# Patient Record
Sex: Male | Born: 1945
Health system: Southern US, Community
[De-identification: ages and names within clinical notes are randomized; demographics above are authoritative.]

## PROBLEM LIST (undated history)

## (undated) ENCOUNTER — Emergency Department (HOSPITAL_COMMUNITY): Payer: Medicare Other

## (undated) DIAGNOSIS — K5790 Diverticulosis of intestine, part unspecified, without perforation or abscess without bleeding: Secondary | ICD-10-CM

## (undated) DIAGNOSIS — N4340 Spermatocele of epididymis, unspecified: Secondary | ICD-10-CM

## (undated) DIAGNOSIS — E785 Hyperlipidemia, unspecified: Secondary | ICD-10-CM

## (undated) DIAGNOSIS — M1990 Unspecified osteoarthritis, unspecified site: Secondary | ICD-10-CM

## (undated) DIAGNOSIS — G4700 Insomnia, unspecified: Secondary | ICD-10-CM

## (undated) DIAGNOSIS — C44711 Basal cell carcinoma of skin of unspecified lower limb, including hip: Secondary | ICD-10-CM

## (undated) DIAGNOSIS — F32A Depression, unspecified: Secondary | ICD-10-CM

## (undated) DIAGNOSIS — H269 Unspecified cataract: Secondary | ICD-10-CM

## (undated) DIAGNOSIS — C61 Malignant neoplasm of prostate: Secondary | ICD-10-CM

## (undated) DIAGNOSIS — C439 Malignant melanoma of skin, unspecified: Secondary | ICD-10-CM

## (undated) DIAGNOSIS — F329 Major depressive disorder, single episode, unspecified: Secondary | ICD-10-CM

## (undated) DIAGNOSIS — N5231 Erectile dysfunction following radical prostatectomy: Secondary | ICD-10-CM

## (undated) DIAGNOSIS — T7840XA Allergy, unspecified, initial encounter: Secondary | ICD-10-CM

## (undated) DIAGNOSIS — I2510 Atherosclerotic heart disease of native coronary artery without angina pectoris: Secondary | ICD-10-CM

## (undated) DIAGNOSIS — I10 Essential (primary) hypertension: Secondary | ICD-10-CM

## (undated) DIAGNOSIS — F419 Anxiety disorder, unspecified: Principal | ICD-10-CM

## (undated) HISTORY — DX: Malignant neoplasm of prostate: C61

## (undated) HISTORY — PX: SPINE SURGERY: SHX786

## (undated) HISTORY — DX: Atherosclerotic heart disease of native coronary artery without angina pectoris: I25.10

## (undated) HISTORY — PX: EYE SURGERY: SHX253

## (undated) HISTORY — DX: Erectile dysfunction following radical prostatectomy: N52.31

## (undated) HISTORY — DX: Hyperlipidemia, unspecified: E78.5

## (undated) HISTORY — DX: Spermatocele of epididymis, unspecified: N43.40

## (undated) HISTORY — PX: HERNIA REPAIR: SHX51

## (undated) HISTORY — DX: Malignant melanoma of skin, unspecified: C43.9

## (undated) HISTORY — DX: Diverticulosis of intestine, part unspecified, without perforation or abscess without bleeding: K57.90

## (undated) HISTORY — DX: Anxiety disorder, unspecified: F41.9

## (undated) HISTORY — PX: KNEE ARTHROSCOPY: SHX127

## (undated) HISTORY — PX: JOINT REPLACEMENT: SHX530

## (undated) HISTORY — DX: Unspecified cataract: H26.9

## (undated) HISTORY — DX: Major depressive disorder, single episode, unspecified: F32.9

## (undated) HISTORY — DX: Insomnia, unspecified: G47.00

## (undated) HISTORY — DX: Allergy, unspecified, initial encounter: T78.40XA

## (undated) HISTORY — DX: Essential (primary) hypertension: I10

## (undated) HISTORY — DX: Basal cell carcinoma of skin of unspecified lower limb, including hip: C44.711

## (undated) HISTORY — PX: APPENDECTOMY: SHX54

## (undated) HISTORY — DX: Depression, unspecified: F32.A

---

## 1963-10-25 HISTORY — PX: KNEE ARTHROSCOPY: SHX127

## 1998-09-14 ENCOUNTER — Emergency Department (HOSPITAL_COMMUNITY): Admission: EM | Admit: 1998-09-14 | Discharge: 1998-09-14 | Payer: Self-pay | Admitting: Emergency Medicine

## 1998-09-14 ENCOUNTER — Encounter: Payer: Self-pay | Admitting: Emergency Medicine

## 2002-07-26 ENCOUNTER — Encounter: Admission: RE | Admit: 2002-07-26 | Discharge: 2002-07-26 | Payer: Self-pay | Admitting: Family Medicine

## 2002-07-26 ENCOUNTER — Encounter: Payer: Self-pay | Admitting: Family Medicine

## 2002-08-05 ENCOUNTER — Ambulatory Visit (HOSPITAL_COMMUNITY): Admission: RE | Admit: 2002-08-05 | Discharge: 2002-08-05 | Payer: Self-pay | Admitting: Gastroenterology

## 2002-08-05 ENCOUNTER — Encounter (INDEPENDENT_AMBULATORY_CARE_PROVIDER_SITE_OTHER): Payer: Self-pay | Admitting: Specialist

## 2002-08-05 ENCOUNTER — Encounter: Payer: Self-pay | Admitting: Internal Medicine

## 2006-02-15 ENCOUNTER — Encounter: Admission: RE | Admit: 2006-02-15 | Discharge: 2006-02-15 | Payer: Self-pay | Admitting: Family Medicine

## 2006-02-17 ENCOUNTER — Encounter: Admission: RE | Admit: 2006-02-17 | Discharge: 2006-02-17 | Payer: Self-pay | Admitting: Family Medicine

## 2006-08-22 ENCOUNTER — Ambulatory Visit: Payer: Self-pay | Admitting: Family Medicine

## 2006-09-05 ENCOUNTER — Ambulatory Visit: Payer: Self-pay | Admitting: Family Medicine

## 2006-09-05 LAB — CONVERTED CEMR LAB
ALT: 50 units/L — ABNORMAL HIGH (ref 0–40)
CO2: 28 meq/L (ref 19–32)
Chloride: 102 meq/L (ref 96–112)
Cholesterol: 218 mg/dL (ref 0–200)
Creatinine, Ser: 1 mg/dL (ref 0.4–1.5)
Glomerular Filtration Rate, Af Am: 97 mL/min/{1.73_m2}
Glucose, Bld: 92 mg/dL (ref 70–99)
Potassium: 4 meq/L (ref 3.5–5.1)
Sodium: 139 meq/L (ref 135–145)
Triglyceride fasting, serum: 276 mg/dL (ref 0–149)

## 2006-11-24 ENCOUNTER — Ambulatory Visit: Payer: Self-pay | Admitting: Family Medicine

## 2006-11-24 LAB — CONVERTED CEMR LAB
ALT: 49 units/L — ABNORMAL HIGH (ref 0–40)
BUN: 14 mg/dL (ref 6–23)
CO2: 28 meq/L (ref 19–32)
Chloride: 105 meq/L (ref 96–112)
Creatinine, Ser: 1 mg/dL (ref 0.4–1.5)
Direct LDL: 112.3 mg/dL
GFR calc Af Amer: 97 mL/min
Glucose, Bld: 90 mg/dL (ref 70–99)
Potassium: 4.3 meq/L (ref 3.5–5.1)
Sodium: 140 meq/L (ref 135–145)

## 2007-01-01 ENCOUNTER — Ambulatory Visit: Payer: Self-pay | Admitting: Family Medicine

## 2007-01-01 LAB — CONVERTED CEMR LAB
AST: 36 units/L (ref 0–37)
CO2: 32 meq/L (ref 19–32)
Calcium: 9.3 mg/dL (ref 8.4–10.5)
Direct LDL: 99.2 mg/dL
HDL: 46.6 mg/dL (ref >39.0)
Potassium: 4.2 meq/L (ref 3.5–5.1)
Sodium: 146 meq/L — ABNORMAL HIGH (ref 135–145)
Total CHOL/HDL Ratio: 3.9

## 2007-02-21 DIAGNOSIS — I10 Essential (primary) hypertension: Secondary | ICD-10-CM

## 2007-02-21 DIAGNOSIS — E785 Hyperlipidemia, unspecified: Secondary | ICD-10-CM | POA: Insufficient documentation

## 2007-02-21 DIAGNOSIS — K5730 Diverticulosis of large intestine without perforation or abscess without bleeding: Secondary | ICD-10-CM | POA: Insufficient documentation

## 2007-02-21 DIAGNOSIS — G4700 Insomnia, unspecified: Secondary | ICD-10-CM

## 2007-02-23 ENCOUNTER — Ambulatory Visit: Payer: Self-pay | Admitting: Family Medicine

## 2007-02-23 LAB — CONVERTED CEMR LAB
Calcium: 9.3 mg/dL (ref 8.4–10.5)
Chloride: 108 meq/L (ref 96–112)
Creatinine, Ser: 0.9 mg/dL (ref 0.4–1.5)
GFR calc Af Amer: 110 mL/min
HDL: 46.6 mg/dL (ref >39.0)
Potassium: 4.3 meq/L (ref 3.5–5.1)
Sodium: 143 meq/L (ref 135–145)
Triglycerides: 228 mg/dL (ref 0–149)

## 2007-03-23 ENCOUNTER — Ambulatory Visit: Payer: Self-pay | Admitting: Family Medicine

## 2007-03-23 LAB — CONVERTED CEMR LAB
BUN: 7 mg/dL (ref 6–23)
CO2: 31 meq/L (ref 19–32)
Creatinine, Ser: 0.8 mg/dL (ref 0.4–1.5)
GFR calc Af Amer: 126 mL/min
GFR calc non Af Amer: 104 mL/min
Glucose, Bld: 91 mg/dL (ref 70–99)

## 2007-04-26 ENCOUNTER — Ambulatory Visit: Payer: Self-pay | Admitting: Family Medicine

## 2007-04-29 LAB — CONVERTED CEMR LAB
BUN: 11 mg/dL (ref 6–23)
GFR calc Af Amer: 126 mL/min
GFR calc non Af Amer: 104 mL/min
Glucose, Bld: 99 mg/dL (ref 70–99)

## 2007-04-30 ENCOUNTER — Telehealth (INDEPENDENT_AMBULATORY_CARE_PROVIDER_SITE_OTHER): Payer: Self-pay | Admitting: *Deleted

## 2007-06-26 ENCOUNTER — Ambulatory Visit: Payer: Self-pay | Admitting: Family Medicine

## 2007-06-27 ENCOUNTER — Telehealth (INDEPENDENT_AMBULATORY_CARE_PROVIDER_SITE_OTHER): Payer: Self-pay | Admitting: *Deleted

## 2007-06-27 LAB — CONVERTED CEMR LAB
CO2: 28 meq/L (ref 19–32)
Cholesterol: 195 mg/dL (ref 0–200)
GFR calc Af Amer: 126 mL/min
Glucose, Bld: 100 mg/dL — ABNORMAL HIGH (ref 70–99)
HDL: 48.1 mg/dL (ref >39.0)
PSA: 4.64 ng/mL — ABNORMAL HIGH (ref 0.10–4.00)
Potassium: 3.5 meq/L (ref 3.5–5.1)
Total CHOL/HDL Ratio: 4.1
VLDL: 41 mg/dL — ABNORMAL HIGH (ref 0–40)

## 2007-07-17 ENCOUNTER — Encounter (INDEPENDENT_AMBULATORY_CARE_PROVIDER_SITE_OTHER): Payer: Self-pay | Admitting: Family Medicine

## 2007-07-25 DIAGNOSIS — C61 Malignant neoplasm of prostate: Secondary | ICD-10-CM

## 2007-07-25 HISTORY — DX: Malignant neoplasm of prostate: C61

## 2007-07-25 HISTORY — PX: PROSTATECTOMY: SHX69

## 2007-07-27 ENCOUNTER — Encounter (INDEPENDENT_AMBULATORY_CARE_PROVIDER_SITE_OTHER): Payer: Self-pay | Admitting: Family Medicine

## 2007-07-31 ENCOUNTER — Encounter (INDEPENDENT_AMBULATORY_CARE_PROVIDER_SITE_OTHER): Payer: Self-pay | Admitting: Family Medicine

## 2007-08-05 DIAGNOSIS — Z8546 Personal history of malignant neoplasm of prostate: Secondary | ICD-10-CM

## 2007-08-16 ENCOUNTER — Encounter (INDEPENDENT_AMBULATORY_CARE_PROVIDER_SITE_OTHER): Payer: Self-pay | Admitting: Family Medicine

## 2007-09-18 ENCOUNTER — Telehealth (INDEPENDENT_AMBULATORY_CARE_PROVIDER_SITE_OTHER): Payer: Self-pay | Admitting: *Deleted

## 2007-10-03 ENCOUNTER — Encounter (INDEPENDENT_AMBULATORY_CARE_PROVIDER_SITE_OTHER): Payer: Self-pay | Admitting: Urology

## 2007-10-03 ENCOUNTER — Inpatient Hospital Stay (HOSPITAL_COMMUNITY): Admission: RE | Admit: 2007-10-03 | Discharge: 2007-10-04 | Payer: Self-pay | Admitting: Urology

## 2007-10-12 ENCOUNTER — Encounter (INDEPENDENT_AMBULATORY_CARE_PROVIDER_SITE_OTHER): Payer: Self-pay | Admitting: Family Medicine

## 2007-11-14 ENCOUNTER — Telehealth (INDEPENDENT_AMBULATORY_CARE_PROVIDER_SITE_OTHER): Payer: Self-pay | Admitting: *Deleted

## 2007-11-16 ENCOUNTER — Encounter (INDEPENDENT_AMBULATORY_CARE_PROVIDER_SITE_OTHER): Payer: Self-pay | Admitting: Family Medicine

## 2007-11-19 ENCOUNTER — Telehealth (INDEPENDENT_AMBULATORY_CARE_PROVIDER_SITE_OTHER): Payer: Self-pay | Admitting: *Deleted

## 2007-11-20 ENCOUNTER — Ambulatory Visit: Payer: Self-pay | Admitting: Family Medicine

## 2007-11-20 DIAGNOSIS — C4330 Malignant melanoma of unspecified part of face: Secondary | ICD-10-CM

## 2007-11-23 ENCOUNTER — Encounter (INDEPENDENT_AMBULATORY_CARE_PROVIDER_SITE_OTHER): Payer: Self-pay | Admitting: *Deleted

## 2007-11-23 LAB — CONVERTED CEMR LAB
ALT: 44 units/L (ref 0–53)
BUN: 9 mg/dL (ref 6–23)
CO2: 30 meq/L (ref 19–32)
GFR calc non Af Amer: 103 mL/min
Glucose, Bld: 100 mg/dL — ABNORMAL HIGH (ref 70–99)
HDL: 48.2 mg/dL (ref >39.0)
Sodium: 139 meq/L (ref 135–145)
Total CHOL/HDL Ratio: 4.6

## 2008-01-07 ENCOUNTER — Encounter: Payer: Self-pay | Admitting: Internal Medicine

## 2008-05-23 ENCOUNTER — Telehealth (INDEPENDENT_AMBULATORY_CARE_PROVIDER_SITE_OTHER): Payer: Self-pay | Admitting: *Deleted

## 2008-06-18 ENCOUNTER — Ambulatory Visit: Payer: Self-pay | Admitting: Internal Medicine

## 2008-09-19 ENCOUNTER — Ambulatory Visit: Payer: Self-pay | Admitting: Internal Medicine

## 2008-09-26 ENCOUNTER — Telehealth (INDEPENDENT_AMBULATORY_CARE_PROVIDER_SITE_OTHER): Payer: Self-pay | Admitting: *Deleted

## 2008-09-26 LAB — CONVERTED CEMR LAB
ALT: 55 units/L — ABNORMAL HIGH (ref 0–53)
AST: 38 units/L — ABNORMAL HIGH (ref 0–37)
BUN: 10 mg/dL (ref 6–23)
Basophils Absolute: 0 10*3/uL (ref 0.0–0.1)
Basophils Relative: 0.7 % (ref 0.0–3.0)
CO2: 30 meq/L (ref 19–32)
Calcium: 9.1 mg/dL (ref 8.4–10.5)
Chloride: 103 meq/L (ref 96–112)
Cholesterol: 204 mg/dL (ref 0–200)
Creatinine, Ser: 0.9 mg/dL (ref 0.4–1.5)
Direct LDL: 97.8 mg/dL
Eosinophils Absolute: 0.3 10*3/uL (ref 0.0–0.7)
Eosinophils Relative: 5.3 % — ABNORMAL HIGH (ref 0.0–5.0)
GFR calc Af Amer: 109 mL/min
GFR calc non Af Amer: 90 mL/min
Glucose, Bld: 91 mg/dL (ref 70–99)
HCT: 44.5 % (ref 39.0–52.0)
HDL: 57.4 mg/dL (ref >39.0)
Hemoglobin: 15.2 g/dL (ref 13.0–17.0)
Lymphocytes Relative: 26 % (ref 12.0–46.0)
MCHC: 34.2 g/dL (ref 30.0–36.0)
MCV: 93.6 fL (ref 78.0–100.0)
Monocytes Absolute: 0.6 10*3/uL (ref 0.1–1.0)
Monocytes Relative: 9.7 % (ref 3.0–12.0)
Neutro Abs: 3.4 10*3/uL (ref 1.4–7.7)
Neutrophils Relative %: 58.3 % (ref 43.0–77.0)
PSA: 0.01 ng/mL — ABNORMAL LOW (ref 0.10–4.00)
Platelets: 256 10*3/uL (ref 150–400)
Potassium: 3.9 meq/L (ref 3.5–5.1)
RBC: 4.76 M/uL (ref 4.22–5.81)
RDW: 12.6 % (ref 11.5–14.6)
Sodium: 141 meq/L (ref 135–145)
TSH: 1.15 microintl units/mL (ref 0.35–5.50)
Total CHOL/HDL Ratio: 3.6
Triglycerides: 182 mg/dL — ABNORMAL HIGH (ref 0–149)
VLDL: 36 mg/dL (ref 0–40)
WBC: 5.8 10*3/uL (ref 4.5–10.5)

## 2008-10-13 ENCOUNTER — Encounter: Payer: Self-pay | Admitting: Internal Medicine

## 2008-10-24 DIAGNOSIS — C439 Malignant melanoma of skin, unspecified: Secondary | ICD-10-CM

## 2008-10-24 HISTORY — PX: HERNIA REPAIR: SHX51

## 2008-10-24 HISTORY — DX: Malignant melanoma of skin, unspecified: C43.9

## 2009-03-19 ENCOUNTER — Ambulatory Visit: Payer: Self-pay | Admitting: Internal Medicine

## 2009-03-19 DIAGNOSIS — R7401 Elevation of levels of liver transaminase levels: Secondary | ICD-10-CM | POA: Insufficient documentation

## 2009-03-19 DIAGNOSIS — R740 Nonspecific elevation of levels of transaminase and lactic acid dehydrogenase [LDH]: Secondary | ICD-10-CM

## 2009-03-20 ENCOUNTER — Telehealth (INDEPENDENT_AMBULATORY_CARE_PROVIDER_SITE_OTHER): Payer: Self-pay | Admitting: *Deleted

## 2009-03-20 LAB — CONVERTED CEMR LAB
Bilirubin, Direct: 0.1 mg/dL (ref 0.0–0.3)
Total Bilirubin: 1.7 mg/dL — ABNORMAL HIGH (ref 0.3–1.2)

## 2009-04-21 ENCOUNTER — Encounter: Payer: Self-pay | Admitting: Internal Medicine

## 2009-06-16 ENCOUNTER — Encounter: Payer: Self-pay | Admitting: Internal Medicine

## 2009-09-01 ENCOUNTER — Telehealth: Payer: Self-pay | Admitting: Family Medicine

## 2009-09-16 ENCOUNTER — Ambulatory Visit: Payer: Self-pay | Admitting: Internal Medicine

## 2009-09-21 ENCOUNTER — Encounter (INDEPENDENT_AMBULATORY_CARE_PROVIDER_SITE_OTHER): Payer: Self-pay | Admitting: *Deleted

## 2009-09-21 LAB — CONVERTED CEMR LAB
BUN: 12 mg/dL (ref 6–23)
Basophils Relative: 0.6 % (ref 0.0–3.0)
CO2: 28 meq/L (ref 19–32)
Calcium: 9.2 mg/dL (ref 8.4–10.5)
Cholesterol: 211 mg/dL — ABNORMAL HIGH (ref 0–200)
Direct LDL: 120.3 mg/dL
GFR calc non Af Amer: 102.84 mL/min (ref >60)
Glucose, Bld: 88 mg/dL (ref 70–99)
HCT: 44.2 % (ref 39.0–52.0)
HDL: 50.8 mg/dL (ref >39.00)
Hemoglobin: 15.2 g/dL (ref 13.0–17.0)
Lymphocytes Relative: 23.8 % (ref 12.0–46.0)
Monocytes Absolute: 0.5 10*3/uL (ref 0.1–1.0)
Neutro Abs: 3 10*3/uL (ref 1.4–7.7)
Neutrophils Relative %: 62.1 % (ref 43.0–77.0)
PSA: 0.01 ng/mL — ABNORMAL LOW (ref 0.10–4.00)
TSH: 0.87 microintl units/mL (ref 0.35–5.50)
Total CHOL/HDL Ratio: 4

## 2009-09-22 ENCOUNTER — Telehealth: Payer: Self-pay | Admitting: Internal Medicine

## 2009-10-13 ENCOUNTER — Encounter: Payer: Self-pay | Admitting: Internal Medicine

## 2009-11-07 ENCOUNTER — Emergency Department (HOSPITAL_COMMUNITY): Admission: EM | Admit: 2009-11-07 | Discharge: 2009-11-07 | Payer: Self-pay | Admitting: Emergency Medicine

## 2009-11-20 ENCOUNTER — Ambulatory Visit: Payer: Self-pay | Admitting: Internal Medicine

## 2010-03-17 ENCOUNTER — Ambulatory Visit: Payer: Self-pay | Admitting: Internal Medicine

## 2010-03-17 DIAGNOSIS — M1990 Unspecified osteoarthritis, unspecified site: Secondary | ICD-10-CM

## 2010-03-18 LAB — CONVERTED CEMR LAB
ALT: 29 units/L (ref 0–53)
Triglycerides: 252 mg/dL — ABNORMAL HIGH (ref 0.0–149.0)
VLDL: 50.4 mg/dL — ABNORMAL HIGH (ref 0.0–40.0)

## 2010-04-08 ENCOUNTER — Telehealth (INDEPENDENT_AMBULATORY_CARE_PROVIDER_SITE_OTHER): Payer: Self-pay | Admitting: *Deleted

## 2010-04-16 ENCOUNTER — Ambulatory Visit: Payer: Self-pay | Admitting: Internal Medicine

## 2010-06-14 ENCOUNTER — Inpatient Hospital Stay (HOSPITAL_COMMUNITY): Admission: AD | Admit: 2010-06-14 | Discharge: 2010-06-15 | Payer: Self-pay | Admitting: Internal Medicine

## 2010-06-14 ENCOUNTER — Ambulatory Visit: Payer: Self-pay | Admitting: Internal Medicine

## 2010-07-01 ENCOUNTER — Ambulatory Visit: Payer: Self-pay | Admitting: Internal Medicine

## 2010-07-01 DIAGNOSIS — R739 Hyperglycemia, unspecified: Secondary | ICD-10-CM

## 2010-08-23 ENCOUNTER — Telehealth (INDEPENDENT_AMBULATORY_CARE_PROVIDER_SITE_OTHER): Payer: Self-pay | Admitting: *Deleted

## 2010-08-26 ENCOUNTER — Encounter (INDEPENDENT_AMBULATORY_CARE_PROVIDER_SITE_OTHER): Payer: Self-pay | Admitting: *Deleted

## 2010-10-13 ENCOUNTER — Telehealth (INDEPENDENT_AMBULATORY_CARE_PROVIDER_SITE_OTHER): Payer: Self-pay | Admitting: *Deleted

## 2010-10-15 ENCOUNTER — Encounter: Payer: Self-pay | Admitting: Internal Medicine

## 2010-11-04 ENCOUNTER — Other Ambulatory Visit: Payer: Self-pay | Admitting: Internal Medicine

## 2010-11-04 ENCOUNTER — Ambulatory Visit
Admission: RE | Admit: 2010-11-04 | Discharge: 2010-11-04 | Payer: Self-pay | Source: Home / Self Care | Attending: Internal Medicine | Admitting: Internal Medicine

## 2010-11-04 LAB — BASIC METABOLIC PANEL
BUN: 13 mg/dL (ref 6–23)
CO2: 32 mEq/L (ref 19–32)
Calcium: 9.1 mg/dL (ref 8.4–10.5)
Chloride: 102 mEq/L (ref 96–112)
Creatinine, Ser: 0.8 mg/dL (ref 0.4–1.5)
GFR: 103.99 mL/min (ref >60.00)
Glucose, Bld: 90 mg/dL (ref 70–99)
Potassium: 3.9 mEq/L (ref 3.5–5.1)
Sodium: 139 mEq/L (ref 135–145)

## 2010-11-04 LAB — AST: AST: 37 U/L (ref 0–37)

## 2010-11-04 LAB — ALT: ALT: 51 U/L (ref 0–53)

## 2010-11-23 NOTE — Progress Notes (Signed)
----   Converted from flag ---- ---- 04/07/2010 5:56 PM, Jose E. Paz MD wrote: please call the patient, he is interested in a shingles shot. Schedule the injection  ---- 03/17/2010 1:16 PM, Jose E. Paz MD wrote: we need to let him know to call by mid  June for his shingles shot ------------------------------  LEFT PT DETAIL MESSAGE VACCINE IN. PLEASE CALL TO SCHEDULE A APPT

## 2010-11-23 NOTE — Assessment & Plan Note (Signed)
Summary: 6 mo. f/u - jr   Vital Signs:  Patient profile:   65 year old male Height:      70.5 inches Weight:      196 pounds BMI:     27.83 Pulse rate:   72 / minute BP sitting:   130 / 70  Vitals Entered By: Shary Decamp (Mar 17, 2010 7:59 AM) CC: rov, fasting   History of Present Illness: routine office visit, doing well Has noted a hard mass in the fourth left knuckle about 3 weeks ago. No pain  Current Medications (verified): 1)  Lovastatin 20 Mg Tabs (Lovastatin) .... Take 1 Tablet By Mouth Once A Day 2)  Hydrochlorothiazide 25 Mg Tabs (Hydrochlorothiazide) .... Take 1 Tablet By Mouth Once A Day 3)  Enalapril Maleate 5 Mg Tabs (Enalapril Maleate) .... Take 1 Tablet By Mouth Once A Day 4)  Doxepin Hcl 50 Mg Caps (Doxepin Hcl) .Marland Kitchen.. 1 By Mouth Once Daily 5)  Fish Oil 6)  Fiber Laxative 7)  Vitamin E 8)  Mvi 9)  Niaspan 1000 Mg Cr-Tabs (Niacin (Antihyperlipidemic)) .Marland Kitchen.. 1 By Mouth Once Daily  Allergies (verified): 1)  ! Pcn 2)  ! Aspirin  Past History:  Past Medical History: MELANOMA  Dx 2010 (face) Diverticulosis, colon Hyperlipidemia Hypertension h/o INSOMNIA    PROSTATE CA--Dx 10-08...s/p Robotic Surgery TEAR, MENISCUS NEC, CURRENT    Social History: Reviewed history from 09/16/2009 and no changes required. 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   Lungs:  normal respiratory effort, no intercostal retractions, no accessory muscle use, and normal breath sounds.   Heart:  normal rate, regular rhythm, and no murmur.   Extremities:  right hand normal Left hand,  fourth knuckle has a 1/2 centimeter hard mass , likely a bony abnormality. No red or tender   Impression & Recommendations:  Problem # 1:  DEGENERATIVE JOINT DISEASE (ICD-715.90) fourth knuckle prominence likely posterior OA, check a  x-ray Orders: T-Hand Left 3 Views (73130TC)  Problem # 2:  HYPERTENSION (ICD-401.9) well-controlled His updated medication list for this problem includes:    Hydrochlorothiazide 25 Mg Tabs (Hydrochlorothiazide) .Marland Kitchen... Take 1 tablet by mouth once a day    Enalapril Maleate 5 Mg Tabs (Enalapril maleate) .Marland Kitchen... Take 1 tablet by mouth once a day  BP today: 130/70 Prior BP: 120/80 (11/20/2009)  Labs Reviewed: K+: 3.6 (09/16/2009) Creat: : 0.8 (09/16/2009)   Chol: 211 (09/16/2009)   HDL: 50.80 (09/16/2009)   LDL: DEL (09/19/2008)   TG: 328.0 (09/16/2009)  Problem # 3:  HYPERLIPIDEMIA (ICD-272.4) doing great with diet, tolerating higher dose of Niaspan, checking labs His updated medication list for this problem includes:    Lovastatin 20 Mg Tabs (Lovastatin) .Marland Kitchen... Take 1 tablet by mouth once a day    Niaspan 1000 Mg Cr-tabs (Niacin (antihyperlipidemic)) .Marland Kitchen... 1 by mouth once daily  Orders: Venipuncture (45409) TLB-ALT (SGPT) (84460-ALT) TLB-AST (SGOT) (84450-SGOT) TLB-Lipid Panel (80061-LIPID)  Labs Reviewed: SGOT: 29 (09/16/2009)   SGPT: 40 (09/16/2009)   HDL:50.80 (09/16/2009), 57.4 (09/19/2008)  LDL:DEL (09/19/2008), DEL (11/20/2007)  Chol:211 (09/16/2009), 204 (09/19/2008)  Trig:328.0 (09/16/2009), 182 (  09/19/2008)  Problem # 4:  HEALTH SCREENING (ICD-V70.0) next colonoscopy 12-11 Interested in a shingles  shot, will let him know when the shot is available  Complete Medication List: 1)  Lovastatin 20 Mg Tabs (Lovastatin) .... Take 1 tablet by mouth once a day 2)  Hydrochlorothiazide 25 Mg Tabs (Hydrochlorothiazide) .... Take 1 tablet by mouth once a day 3)  Enalapril Maleate 5 Mg Tabs (Enalapril maleate) .... Take 1 tablet by mouth once a  day 4)  Doxepin Hcl 50 Mg Caps (Doxepin hcl) .Marland Kitchen.. 1 by mouth once daily 5)  Fish Oil  6)  Fiber Laxative  7)  Vitamin E  8)  Mvi  9)  Niaspan 1000 Mg Cr-tabs (Niacin (antihyperlipidemic)) .Marland Kitchen.. 1 by mouth once daily  Patient Instructions: 1)  Please schedule a follow-up appointment in 6 months .

## 2010-11-23 NOTE — Assessment & Plan Note (Signed)
Summary: REMOVE STAPLES/KDC   Vital Signs:  Patient profile:   65 year old male Height:      70.5 inches Pulse rate:   76 / minute BP sitting:   120 / 80  Vitals Entered By: Shary Decamp (November 20, 2009 11:01 AM) CC: remove staples Comments  - went to ED 1/15, laceration to scalp  - removed 10 staples with no problems, difficulty  - advised to call with any questions or concerns Shary Decamp  November 20, 2009 11:02 AM    Allergies: 1)  ! Pcn 2)  ! Aspirin   Complete Medication List: 1)  Lovastatin 20 Mg Tabs (Lovastatin) .... Take 1 tablet by mouth once a day 2)  Hydrochlorothiazide 25 Mg Tabs (Hydrochlorothiazide) .... Take 1 tablet by mouth once a day 3)  Enalapril Maleate 5 Mg Tabs (Enalapril maleate) .... Take 1 tablet by mouth once a day 4)  Doxepin Hcl 50 Mg Caps (Doxepin hcl) .Marland Kitchen.. 1 by mouth once daily 5)  Fish Oil  6)  Fiber Laxative  7)  Vitamin E  8)  Mvi  9)  Niaspan 1000 Mg Cr-tabs (Niacin (antihyperlipidemic)) .Marland Kitchen.. 1 by mouth once daily  Other Orders: Suture Removal by Non-Operative MD [phone])

## 2010-11-23 NOTE — Assessment & Plan Note (Signed)
Summary: 2WK CARDIAC CATH F/U PER PT /RH..........Larry Elliott   Vital Signs:  Patient profile:   65 year old male Weight:      192.13 pounds Pulse rate:   73 / minute Pulse rhythm:   regular BP sitting:   126 / 82  (left arm) Cuff size:   large  Vitals Entered By: Army Fossa CMA (July 01, 2010 10:55 AM) Larry Elliott    [facility]: Hospital f/u, not fasting Comments Discuss niaspan Declines flu shot   History of Present Illness: status post admission for chest pain and dyspnea D-dimer negative CBC was normal, TSH 1.2, hemoglobin A1c 5.6, potassium 3.7, creatinine 0.8 Chest x-ray negative Cardiac catheterization showed minimal coronary artery plaque.  Normal left ventricular function.  Normal right heart pressures and saturations.    Current Medications (verified): 1)  Lovastatin 20 Mg Tabs (Lovastatin) .... Take 1 Tablet By Mouth Once A Day 2)  Hydrochlorothiazide 25 Mg Tabs (Hydrochlorothiazide) .... Take 1 Tablet By Mouth Once A Day 3)  Enalapril Maleate 5 Mg Tabs (Enalapril Maleate) .... Take 1 Tablet By Mouth Once A Day 4)  Doxepin Hcl 50 Mg Caps (Doxepin Hcl) .Larry Elliott.. 1 By Mouth Once Daily 5)  Fish Oil 6)  Fiber Laxative 7)  Vitamin E 8)  Mvi 9)  Niaspan 1000 Mg Cr-Tabs (Niacin (Antihyperlipidemic)) .Larry Elliott.. 1 By Mouth Once Daily 10)  Terbinafine Hcl 250 Mg Tabs (Terbinafine Hcl) .Larry Elliott.. 1 By Mouth Daily  Allergies: 1)  ! Pcn 2)  ! Aspirin  Past History:  Past Medical History: MELANOMA  Dx 2010 (face) Diverticulosis, colon Hyperlipidemia Hypertension h/o INSOMNIA    PROSTATE CA--Dx 10-08...s/p Robotic Surgery TEAR, MENISCUS NEC, CURRENT   8-2011CP, Cath non-obstrutive CAD  Social History: Reviewed history from 09/16/2009 and no changes required. 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 No abdominal pain.  He is essentially back to his normal activities occ. his legs bother him at night, he needs to "shake them" to feel better.  Physical Exam  General:  alert and well-developed.   Lungs:  normal respiratory effort, no intercostal retractions, no accessory muscle use, and normal breath sounds.   Heart:  normal rate, regular rhythm, and no murmur.   Abdomen:  soft, non-tender, no distention, no masses, no guarding, and no rigidity.   groins free of mass Pulses:  normal  femoral pulses bilaterally. Extremities:  Note no extremity edema Psych:  not anxious appearing and not depressed appearing.     Impression & Recommendations:  Problem # 1:  CHEST PAIN (ICD-786.50) resolved  cardiac catheterization essentially negative  Problem # 2:  HYPERTENSION (ICD-401.9) no change His updated medication list for this problem includes:    Hydrochlorothiazide 25 Mg Tabs (Hydrochlorothiazide) .Larry Elliott... Take 1 tablet by mouth once a day    Enalapril Maleate 5 Mg Tabs (Enalapril maleate) .Larry Elliott... Take 1 tablet by mouth once a day  BP today: 126/82 Prior BP: 126/82 (06/14/2010)  Labs Reviewed: K+: 3.6 (09/16/2009) Creat: : 0.8 (09/16/2009)   Chol: 218 (03/17/2010)   HDL: 65.90 (03/17/2010)   LDL: DEL (09/19/2008)   TG: 252.0 (03/17/2010)  Problem # 3:  HYPERLIPIDEMIA (ICD-272.4) likes to switch to a different Niaspan,likes to try niacin  500 mg twice a day (cost!). His new pharmacist will call for a refill when needed His updated medication list for this problem includes:    Lovastatin 20 Mg Tabs (Lovastatin) .Larry Elliott... Take 1 tablet by mouth once a day    Niacin 500 Mg Tabs (Niacin) .Larry Elliott... 2 a day  Labs Reviewed: SGOT: 26 (03/17/2010)   SGPT: 29 (03/17/2010)   HDL:65.90 (03/17/2010), 50.80 (09/16/2009)   LDL:DEL (09/19/2008), DEL (11/20/2007)  Chol:218 (03/17/2010), 211 (09/16/2009)  Trig:252.0 (03/17/2010), 328.0 (09/16/2009)  Problem # 4:  HYPERGLYCEMIA, BORDERLINE (ICD-790.29) hemoglobin A1c at the hospital was 5.6 This was discussed with patient Diet and exercise for now  Problem # 5:  ? of RESTLESS LEG SYNDROME (ICD-333.94) see  review of systems Mild RLS ? Plan: Benadryl as needed, if symptoms progress will consider other medications  Problem # 6:  will get a flu shot at work  Complete Medication List: 1)  Lovastatin 20 Mg Tabs (Lovastatin) .... Take 1 tablet by mouth once a day 2)  Hydrochlorothiazide 25 Mg Tabs (Hydrochlorothiazide) .... Take 1 tablet by mouth once a day 3)  Enalapril Maleate 5 Mg Tabs (Enalapril maleate) .... Take 1 tablet by mouth once a day 4)  Doxepin Hcl 50 Mg Caps (Doxepin hcl) .Larry Elliott.. 1 by mouth once daily 5)  Fish Oil  6)  Fiber Laxative  7)  Vitamin E  8)  Mvi  9)  Niacin 500 Mg Tabs (Niacin) .... 2 a day 10)  Terbinafine Hcl 250 Mg Tabs (Terbinafine hcl) .Larry Elliott.. 1 by mouth daily  Patient Instructions: 1)  Please schedule a follow-up appointment in 4 months .

## 2010-11-23 NOTE — Assessment & Plan Note (Signed)
Summary: SHINGLE VACCINE//KN  Nurse Visit   Allergies: 1)  ! Pcn 2)  ! Aspirin  Immunizations Administered:  Zostavax # 1:    Vaccine Type: Zostavax    Site: right arm    Mfr: Merck    Dose: 0.5 ml    Route: [place]    Given by: Mervin Kung CMA    Exp. Date: 05/19/2011    Lot #: [id number]  Orders Added: 1)  Zoster (Shingles) Vaccine Live [90736] 2)  Admin 1st Vaccine [phone]

## 2010-11-23 NOTE — Miscellaneous (Signed)
Summary: flu shot @ [facility]   Immunization History:  Influenza Immunization History:    Influenza:  got @ [facility] (08/19/2010)

## 2010-11-23 NOTE — Assessment & Plan Note (Signed)
Summary: chest pressure//lch   Vital Signs:  Patient profile:   65 year old male Weight:      194 pounds Pulse rate:   104 / minute Pulse rhythm:   regular BP sitting:   126 / 82  (left arm) Cuff size:   large  Vitals Entered By: Army Fossa CMA (June 14, 2010 2:02 PM) CC: Tightness in chest and SOB x 2 weeks.    History of Present Illness: chief complaint chest pain 2-3 week history of chest tightness, located at day anterior lower chest, on and off, worse with exertion, never goes away completely. The "tightness" does not change with eating, sometimes get worse by pushing in that area Along with the chest pain, he has also noticed dyspnea on exertion with activities  that otherwise in the recent past he would be able to do without problems. In fact, he got short of breath walking from his car to the front desk today.  ROS Denies fevers Denies cough or wheezing No abdominal pain or GERD symptoms. Specifically no right upper quadrant abdominal pain Note no extremity edema, no recent airplane flights or pain in the legs.  Current Medications (verified): 1)  Lovastatin 20 Mg Tabs (Lovastatin) .... Take 1 Tablet By Mouth Once A Day 2)  Hydrochlorothiazide 25 Mg Tabs (Hydrochlorothiazide) .... Take 1 Tablet By Mouth Once A Day 3)  Enalapril Maleate 5 Mg Tabs (Enalapril Maleate) .... Take 1 Tablet By Mouth Once A Day 4)  Doxepin Hcl 50 Mg Caps (Doxepin Hcl) .Marland Kitchen.. 1 By Mouth Once Daily 5)  Fish Oil 6)  Fiber Laxative 7)  Vitamin E 8)  Mvi 9)  Niaspan 1000 Mg Cr-Tabs (Niacin (Antihyperlipidemic)) .Marland Kitchen.. 1 By Mouth Once Daily  Allergies (verified): 1)  ! Pcn 2)  ! Aspirin  Past History:  Past Medical History: Reviewed history from 03/17/2010 and no changes required. MELANOMA  Dx 2010 (face) Diverticulosis, colon Hyperlipidemia Hypertension h/o INSOMNIA    PROSTATE CA--Dx 10-08...s/p Robotic Surgery TEAR, MENISCUS NEC, CURRENT    Past Surgical History: Reviewed  history from 02/21/2007 and no changes required. Appendectomy Hernia  Social History: Reviewed history from 09/16/2009 and no changes required. 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   Chest Wall:  slightly tender at the distal area of the sternum Lungs:  normal respiratory effort, no intercostal retractions, no accessory muscle use, and normal breath sounds.   Heart:  normal rate, regular rhythm, and no murmur.   Abdomen:  soft, non-tender, no distention, no masses, no guarding, and no rigidity.   Extremities:  no edema Psych:  Oriented X3, memory intact for recent and remote, normally interactive, good eye contact, not anxious appearing, and not depressed appearing.     Impression & Recommendations:  Problem # 1:  CHEST PAIN (ICD-29.64)  65 year old gentleman with history of hypertension,  high cholesterol who presents with 2-3 week history of chest pain (with typical and atypical features) as well as  dyspnea on exertion. EKG today is at baseline, no acute changes I'm concerned his symptoms may represent CAD.  Plan: Admit to 23 hours observation Rule out Further workup per cardiology d/w cards, will admit to Dr Tenny Craw   Orders: EKG w/ Interpretation (93000)  Complete Medication List: 1)  Lovastatin 20 Mg Tabs (Lovastatin) .... Take 1 tablet by mouth once a day 2)  Hydrochlorothiazide  25 Mg Tabs (Hydrochlorothiazide) .... Take 1 tablet by mouth once a day 3)  Enalapril Maleate 5 Mg Tabs (Enalapril maleate) .... Take 1 tablet by mouth once a day 4)  Doxepin Hcl 50 Mg Caps (Doxepin hcl) .Marland Kitchen.. 1 by mouth once daily 5)  Fish Oil  6)  Fiber Laxative  7)  Vitamin E  8)  Mvi  9)  Niaspan 1000 Mg Cr-tabs (Niacin (antihyperlipidemic)) .Marland Kitchen.. 1 by mouth once daily  Appended Document: chest pressure//lch phone  call from cardiology, the patient has a cardiac catheterization , will be discharged home. Nonobstructive coronary disease, full report pending

## 2010-11-23 NOTE — Progress Notes (Signed)
Summary: REFILL  Phone Note Refill Request Message from:  Patient on August 23, 2010 1:04 PM  Refills Requested: Medication #1:  HYDROCHLOROTHIAZIDE 25 MG TABS Take 1 tablet by mouth once a day   Dosage confirmed as above?Dosage Confirmed   Supply Requested: 1 month  Medication #2:  DOXEPIN HCL 50 MG CAPS 1 by mouth once daily   Dosage confirmed as above?Dosage Confirmed   Supply Requested: 1 month [facility]  Next Appointment Scheduled: 09/20/10 Initial call taken by: Lavell Islam,  August 23, 2010 1:04 PM    Prescriptions: DOXEPIN HCL 50 MG CAPS (DOXEPIN HCL) 1 by mouth once daily  #90 x 2   Entered by:   Army Fossa CMA   Authorized by:   Nolon Rod. Paz MD   Signed by:   Army Fossa CMA on 08/23/2010   Method used:   Electronically to        [facility]* (retail)       [address] [place], [place]  [address]       Ph: [phone]       Fax: [phone]   RxID:   [id number] HYDROCHLOROTHIAZIDE 25 MG TABS (HYDROCHLOROTHIAZIDE) Take 1 tablet by mouth once a day  #90 x 2   Entered by:   Army Fossa CMA   Authorized by:   Nolon Rod. Paz MD   Signed by:   Army Fossa CMA on 08/23/2010   Method used:   Electronically to        [facility]* (retail)       [address] [place], [place]  [address]       Ph: [phone]       Fax: [phone]   RxID:   [id number]

## 2010-11-25 NOTE — Progress Notes (Signed)
Summary: REFILL  Phone Note Refill Request Message from:  Patient on October 13, 2010 12:46 PM  Refills Requested: Medication #1:  LOVASTATIN 20 MG TABS Take 1 tablet by mouth once a day   Dosage confirmed as above?Dosage Confirmed   Supply Requested: 1 month  Medication #2:  ENALAPRIL MALEATE 5 MG TABS Take 1 tablet by mouth once a day   Dosage confirmed as above?Dosage Confirmed   Supply Requested: 1 month [facility]  Next Appointment Scheduled: 11/03/10 Initial call taken by: Lavell Islam,  October 13, 2010 12:47 PM    Prescriptions: ENALAPRIL MALEATE 5 MG TABS (ENALAPRIL MALEATE) Take 1 tablet by mouth once a day  #90 x 2   Entered by:   Army Fossa CMA   Authorized by:   Nolon Rod. Paz MD   Signed by:   Army Fossa CMA on 10/13/2010   Method used:   Electronically to        [facility]* (retail)       [address] [place], [place]  [address]       Ph: [phone]       Fax: [phone]   RxID:   [id number] LOVASTATIN 20 MG TABS (LOVASTATIN) Take 1 tablet by mouth once a day  #90 x 2   Entered by:   Army Fossa CMA   Authorized by:   Nolon Rod. Paz MD   Signed by:   Army Fossa CMA on 10/13/2010   Method used:   Electronically to        [facility]* (retail)       [address] [place], [place]  [address]       Ph: [phone]       Fax: [phone]   RxID:   [id number]

## 2010-11-25 NOTE — Assessment & Plan Note (Signed)
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 Take 1 Tablet By Mouth Once A Day 2)  Hydrochlorothiazide 25 Mg Tabs (Hydrochlorothiazide) .... Take 1 Tablet By Mouth Once A Day 3)  Enalapril Maleate 5 Mg Tabs (Enalapril Maleate) .... Take 1 Tablet By Mouth Once A Day 4)  Doxepin Hcl 50 Mg Caps (Doxepin Hcl) .Marland Kitchen.. 1 By Mouth Once Daily 5)  Fish Oil 6)  Fiber Laxative 7)  Vitamin E 8)  Mvi 9)  Niacin 500 Mg Tabs (Niacin) .... 2 A Day 10)  Terbinafine Hcl 250 Mg Tabs (Terbinafine Hcl) .Marland Kitchen.. 1 By Mouth Daily  Allergies (verified): 1)  ! Pcn 2)  ! Aspirin  Past History:  Past Medical History: Reviewed history from 07/01/2010 and no changes required. MELANOMA  Dx 2010 (face) Diverticulosis, colon Hyperlipidemia Hypertension h/o INSOMNIA    PROSTATE CA--Dx 10-08...s/p Robotic Surgery TEAR, MENISCUS NEC, CURRENT   8-2011CP, Cath non-obstrutive CAD  Past Surgical History: Reviewed history from 02/21/2007 and no changes required. Appendectomy Hernia  Physical Exam  General:  alert and well-developed.   Lungs:  normal respiratory effort, no intercostal retractions, no accessory muscle use, and normal breath sounds.   Heart:  normal rate, regular rhythm, and no  murmur.   Extremities:  Note no extremity edema Hip rotation full without tenderness bilaterally. Palpation over the trochanter bursa on either side without tenderness Neurologic:  strength normal in all extremities and gait normal.   Psych:  not anxious appearing and not depressed appearing.     Impression & Recommendations:  Problem # 1:  HYPERTENSION (ICD-401.9) at goal  His updated medication list for this problem includes:    Hydrochlorothiazide 25 Mg Tabs (Hydrochlorothiazide) .Marland Kitchen... Take 1 tablet by mouth once a day    Enalapril Maleate 5 Mg Tabs (Enalapril maleate) .Marland Kitchen... Take 1 tablet by mouth once a day  BP today: 126/70 Prior BP: 126/82 (07/01/2010)  Labs Reviewed: K+: 3.6 (09/16/2009) Creat: : 0.8 (09/16/2009)   Chol: 218 (03/17/2010)   HDL: 65.90 (03/17/2010)   LDL: DEL (09/19/2008)   TG: 252.0 (03/17/2010)  Orders: Venipuncture (82956) TLB-BMP (Basic Metabolic Panel-BMET) (80048-METABOL) Specimen Handling (21308)  Problem # 2:  HYPERLIPIDEMIA (ICD-272.4) plans to switch from niaspan to  over-the-counter niacin soon. knows to call if  has  side effects His updated medication list for this problem includes:    Lovastatin 20 Mg Tabs (Lovastatin) .Marland Kitchen... Take 1 tablet by mouth once a day    Niacin 500 Mg Tabs (Niacin) .Marland Kitchen... 2 a day  Labs Reviewed: SGOT: 26 (03/17/2010)   SGPT: 29 (03/17/2010)   HDL:65.90 (03/17/2010),  50.80 (09/16/2009)  LDL:DEL (09/19/2008), DEL (11/20/2007)  Chol:218 (03/17/2010), 211 (09/16/2009)  Trig:252.0 (03/17/2010), 328.0 (09/16/2009)  Problem # 3:  DEGENERATIVE JOINT DISEASE (ICD-715.90) hip pain without evidence of trochanteric bursitis Recommended to warm up before physical activity. Allergic to aspirin but he does take Tylenol which helps with the pain.  Problem # 4:  HEALTH SCREENING (ICD-V70.0) had colonoscopy December 2011, 2 polyps, Dr. Randa Evens. Next colonoscopy 5 years ( per patient)  Complete Medication List: 1)  Lovastatin 20  Mg Tabs (Lovastatin) .... Take 1 tablet by mouth once a day 2)  Hydrochlorothiazide 25 Mg Tabs (Hydrochlorothiazide) .... Take 1 tablet by mouth once a day 3)  Enalapril Maleate 5 Mg Tabs (Enalapril maleate) .... Take 1 tablet by mouth once a day 4)  Doxepin Hcl 50 Mg Caps (Doxepin hcl) .Marland Kitchen.. 1 by mouth once daily 5)  Fish Oil  6)  Fiber Laxative  7)  Vitamin E  8)  Mvi  9)  Niacin 500 Mg Tabs (Niacin) .... 2 a day  Other Orders: TLB-ALT (SGPT) (84460-ALT) TLB-AST (SGOT) (84450-SGOT)  Patient Instructions: 1)  Please schedule a follow-up appointment in 4  months, fasting physical    Orders Added: 1)  Venipuncture [36415] 2)  TLB-BMP (Basic Metabolic Panel-BMET) [80048-METABOL] 3)  TLB-ALT (SGPT) [84460-ALT] 4)  TLB-AST (SGOT) [84450-SGOT] 5)  Specimen Handling [99000] 6)  Est. Patient Level III [78295]

## 2010-11-25 NOTE — Procedures (Signed)
Summary: Colonoscopy--tics,  polyps x2  Colonoscopy/[facility]   Imported By: Lanelle Bal 11/05/2010 09:29:05  _____________________________________________________________________  External Attachment:    Type:   Image     Comment:   External Document

## 2011-01-07 LAB — POCT I-STAT 3, ART BLOOD GAS (G3+)
Bicarbonate: 25.4 mEq/L — ABNORMAL HIGH (ref 20.0–24.0)
TCO2: 27 mmol/L (ref 0–100)
pH, Arterial: 7.432 (ref 7.350–7.450)
pO2, Arterial: 64 mmHg — ABNORMAL LOW (ref 80.0–100.0)

## 2011-01-07 LAB — CBC
HCT: 41.5 % (ref 39.0–52.0)
MCH: 31.4 pg (ref 26.0–34.0)
MCH: 32.4 pg (ref 26.0–34.0)
MCV: 92.7 fL (ref 78.0–100.0)
Platelets: 249 10*3/uL (ref 150–400)
RBC: 4.65 MIL/uL (ref 4.22–5.81)
WBC: 5.9 10*3/uL (ref 4.0–10.5)

## 2011-01-07 LAB — HEMOGLOBIN A1C: Hgb A1c MFr Bld: 5.6 % (ref <5.7)

## 2011-01-07 LAB — BASIC METABOLIC PANEL
Creatinine, Ser: 0.8 mg/dL (ref 0.4–1.5)
GFR calc Af Amer: >60 mL/min (ref >60)
Glucose, Bld: 98 mg/dL (ref 70–99)

## 2011-01-07 LAB — POCT I-STAT 3, VENOUS BLOOD GAS (G3P V)
Acid-Base Excess: 1 mmol/L (ref 0.0–2.0)
O2 Saturation: 68 %
pCO2, Ven: 39.1 mmHg — ABNORMAL LOW (ref 45.0–50.0)
pCO2, Ven: 42.5 mmHg — ABNORMAL LOW (ref 45.0–50.0)
pO2, Ven: 31 mmHg (ref 30.0–45.0)
pO2, Ven: 35 mmHg (ref 30.0–45.0)

## 2011-01-07 LAB — CARDIAC PANEL(CRET KIN+CKTOT+MB+TROPI)
CK, MB: 0.6 ng/mL (ref 0.3–4.0)
CK, MB: 0.7 ng/mL (ref 0.3–4.0)
CK, MB: 0.7 ng/mL (ref 0.3–4.0)
Relative Index: INVALID (ref 0.0–2.5)
Relative Index: INVALID (ref 0.0–2.5)
Total CK: 65 U/L (ref 7–232)
Troponin I: <0.01 ng/mL (ref 0.00–0.06)

## 2011-01-07 LAB — D-DIMER, QUANTITATIVE: D-Dimer, Quant: <0.22 ug/mL-FEU (ref 0.00–0.48)

## 2011-01-07 LAB — PROTIME-INR: INR: 0.9 (ref 0.00–1.49)

## 2011-01-07 LAB — TSH: TSH: 1.256 u[IU]/mL (ref 0.350–4.500)

## 2011-03-08 NOTE — H&P (Signed)
NAME:  Larry Elliott, Larry Elliott NO.:  [id number]   MEDICAL RECORD NO.:  [id number]          PATIENT TYPE:  INP   LOCATION:  NA                           FACILITY:  [facility]   PHYSICIAN:  Heloise Purpura, MD      DATE OF BIRTH:  Apr 17, 1946   DATE OF ADMISSION:  DATE OF DISCHARGE:                              HISTORY & PHYSICAL   CHIEF COMPLAINT:  Prostate cancer.   HISTORY OF PRESENT ILLNESS:  Larry Elliott is a 65 year old gentleman with  clinical stage T1c prostate cancer with PSA of 4.6 and Gleason score of  3+3=6.  His biopsy was performed on July 27, 2007 and demonstrated 1/3  positive cores from the right apex, 3/3 positive cores from the right  mid gland, and 1/3 positive cores from the right base.  After discussion  regarding management options for clinically localized prostate cancer,  the patient elected to proceed with surgical therapy and a radical  prostatectomy performed via a minimally invasive approach.   PAST MEDICAL HISTORY:  1. Hyperlipidemia.  2. Hypertension.  3. History of skin cancer.   PAST SURGICAL HISTORY:  1. Appendectomy.  2. Left inguinal hernia repair.  3. Knee surgery.  4. Left rotator cuff repair.   MEDICATIONS:  1. Doxepin.  2. Enalapril.  3. Hydrochlorothiazide.  4. Lovastatin.  5. Lovasa.  6. Multivitamin.  7. Niaspan.  8. Vitamin E.   ALLERGIES:  1. ASPIRIN.  2. PENICILLIN.   FAMILY HISTORY:  1. There is a paternal history of atherosclerosis and coronary artery      disease.  2. Maternal history of ischemic stroke.  3. No history of GU malignancy.   SOCIAL HISTORY:  The patient drinks two glasses of alcohol per day.  He  is currently married.  He works as a Medical laboratory scientific officer.   REVIEW OF SYSTEMS:  A complete Review of Systems was performed.  Pertinent positives include recent fatigue, a tendency to easily bruise,  shortness of breath, and back pain.   PHYSICAL EXAMINATION:  CONSTITUTIONAL:  Well-nourished,  well-developed  age-appropriate male in no acute distress.  CARDIOVASCULAR:  Regular rate and rhythm without obvious murmurs.  LUNGS:  Clear bilaterally.  ABDOMEN:  Protuberant but soft and nontender without masses.  RECTAL:  Digital rectal exam:  No prostate nodularity or induration.   IMPRESSION:  Clinically localized adenocarcinoma of the prostate.   PLAN:  Larry Elliott will undergo robotic assisted laparoscopic radical  prostatectomy.  He will then be admitted to the hospital for routine  postoperative care.      Heloise Purpura, MD  Electronically Signed     LB/MEDQ  D:  10/03/2007  T:  10/03/2007  Job:  [id number]

## 2011-03-08 NOTE — Discharge Summary (Signed)
NAME:  KIANTE, CIAVARELLA NO.:  [id number]   MEDICAL RECORD NO.:  [id number]          PATIENT TYPE:  INP   LOCATION:  1415                         FACILITY:  [facility]   PHYSICIAN:  Heloise Purpura, MD      DATE OF BIRTH:  October 10, 1946   DATE OF ADMISSION:  10/03/2007  DATE OF DISCHARGE:  10/04/2007                               DISCHARGE SUMMARY   ADMISSION DIAGNOSIS:  Prostate cancer.   DISCHARGE DIAGNOSIS:  Prostate cancer.   HISTORY AND PHYSICAL:  For full details, please see admission history  and physical.  Briefly, Mr. Winsor is a 65 year old gentleman with  clinically localized adenocarcinoma of the prostate.  After discussion  regarding management options for treatment, he elected to proceed with  surgical therapy and a robotic-assisted laparoscopic radical  prostatectomy.   HOSPITAL COURSE:  On October 03, 2007, the patient was taken to the  operating room and underwent the above procedure.  He tolerated this  well without complications.  Postoperatively, he was able to be  transferred to a regular hospital room following recovery from  anesthesia.  He was able to begin ambulating the night of surgery and  remained hemodynamically stable.  On the morning of postoperative day  #1, his hematocrit was found to be stable at 37.  He had minimal output  from his pelvic drain with excellent urine output.  His pelvic drain was  therefore removed.  He was begun on a clear liquid diet, which he  tolerated without difficulty and was able to be transitioned to oral  pain medication.  By the afternoon of postoperative day #1, he had met  all discharge criteria and was discharged home in excellent condition.   DISPOSITION:  Home.   DISCHARGE MEDICATIONS:  He was instructed to resume all his regular home  medications excepting any aspirin, nonsteroidal anti-inflammatory drugs,  or herbal supplements.  He was given a prescription to take Vicodin as  needed for pain and to  begin Cipro 1 day prior to his return visit for  Foley catheter removal.   DISCHARGE INSTRUCTIONS:  Mr. Pitkin was instructed to ambulate but to  refrain from any heavy lifting, strenuous activity, or driving.  He was  instructed to gradually advance his diet once passing flatus and was  instructed on routine Foley catheter care.   FOLLOW UP:  He will follow up in 1 week for Foley catheter removal and  to discuss his surgical pathology in detail.      Heloise Purpura, MD  Electronically Signed     LB/MEDQ  D:  10/04/2007  T:  10/04/2007  Job:  [id number]

## 2011-03-08 NOTE — Op Note (Signed)
NAME:  Larry Elliott, Larry Elliott NO.:  [id number]   MEDICAL RECORD NO.:  [id number]          PATIENT TYPE:  INP   LOCATION:  1415                         FACILITY:  [facility]   PHYSICIAN:  Heloise Purpura, MD      DATE OF BIRTH:  May 22, 1946   DATE OF PROCEDURE:  10/03/2007  DATE OF DISCHARGE:                               OPERATIVE REPORT   PREOPERATIVE DIAGNOSIS:  Clinically localized adenocarcinoma of the  prostate.   POSTOPERATIVE DIAGNOSIS:  Clinically localized adenocarcinoma of the  prostate.   PROCEDURE:  Robotic-assisted laparoscopic radical prostatectomy  (bilateral nerve sparing).   SURGEON:  Dr. Heloise Purpura.   ASSISTANT:  Dr. Boston Service.   ANESTHESIA:  General.   COMPLICATIONS:  None.   ESTIMATED BLOOD LOSS:  150 mL.   SPECIMENS:  Prostate and seminal vesicles.   DISPOSITION:  Specimen to pathology.   DRAINS:  1. 20-French coude catheter.  2. #19 Blake pelvic drain.   INDICATIONS:  Mr. Larry Elliott is a 65 year old gentleman with clinically  localized adenocarcinoma of the prostate.  After a discussion regarding  management options for treatment, he elected to proceed with the above  procedure.  The potential risks, complications, and alternative options  were discussed with the patient in detail and informed consent was  obtained.   DESCRIPTION OF PROCEDURE:  The patient was taken to the operating room  and a general anesthetic was administered.  He was given preoperative  antibiotics, placed in the dorsal lithotomy position, and prepped and  draped in the usual sterile fashion.  Next a preoperative time-out was  performed.  A Foley catheter was then inserted into the bladder and a  site was selected just to the left of the umbilicus for placement of the  camera port.  This was placed using a standard open Hassan technique.  This allowed entry into the peritoneal cavity under direct vision  without difficulty.  A 12-mm port was then placed and a  pneumoperitoneum  was established.  The zero-degree lens was used to inspect the abdomen  and there was no evidence of any intra-abdominal injuries or other  abnormalities.  The remaining ports were then placed with bilateral 8-mm  robotic ports placed inferior and lateral to the camera port.  An  additional 8-mm robotic port was placed in the far left lateral  abdominal wall.  A 5-mm port was placed between the camera port and the  right robotic port.  An additional 12-mm port was placed in the far  right lateral abdominal wall for laparoscopic assistance.  All ports  were placed under direct vision and without difficulty.  The surgical  cart was then docked with the aid of the cautery scissors, the bladder  was reflected posteriorly allowing entry into the space of Retzius and  identification of the endopelvic fascia and prostate.  The endopelvic  fascia was then incised from the apex down to the base of the prostate  bilaterally and the underlying levator muscle fibers were swept  laterally off the prostate.  This isolated the dorsal venous complex  which was stapled and divided  with a 45 mm flex ETS stapler.  The  bladder neck was then identified with the aid of Foley catheter  manipulation and divided anteriorly.  The Foley catheter balloon was  identified and the catheter balloon was deflated.  The catheter was then  brought into the operative field and used to retract the prostate  anteriorly.  The posterior bladder neck was then divided and dissection  continued posteriorly between the bladder and prostate until the vasa  deferentia and seminal vesicles were identified.  The vasa deferentia  were isolated, divided and lifted anteriorly.  The seminal vesicles were  then dissected down to their tips with care to control the seminal  vesicle arterial blood supply.  The seminal vesicles were then lifted  anteriorly and the space between Denonvilliers fascia and the anterior  rectum  was bluntly developed thereby isolating the vascular pedicles of  the prostate.  The lateral prostatic fascia was then incised bilaterally  allowing the neurovascular bundles to be swept laterally and posteriorly  off the prostate.  The vascular pedicles of the prostate were then  ligated with Hem-o-lok clips and sharply divided allowing the  neurovascular bundles to be swept laterally and posteriorly off the  prostate and off the apex of the prostate and urethra.  The urethra was  then sharply divided and the prostate specimen was disarticulated.  The  pelvis was copiously irrigated and hemostasis was ensured.  With  irrigation in the pelvis, air was injected into the rectal catheter and  there was no evidence of a rectal injury.  Attention then turned to the  urethral anastomosis.  A 2-0 Vicryl slip-knot was placed between the  bladder neck and urethra to reapproximate these structures.  A double-  armed 3-0 Monocryl suture was then used to perform a 360 degree running  tension-free anastomosis.  A 20-French coude' catheter was then inserted  into the bladder and irrigated.  The anastomosis appeared to be  watertight.  There were no blood clots within the bladder.  A #19 Blake  drain was then brought through the left robotic port and appropriately  positioned in the pelvis.  It was secured to the skin with a nylon  suture.  The surgical cart was then undocked. The right lateral 12-mm  port site was closed with a zero Vicryl suture placed with the aid of  the suture passer device.  All remaining ports were removed under direct  vision.  The prostate specimen was removed within the Endopouch  retrieval bag via the periumbilical port site. This fascial opening was  then closed with a running zero Vicryl suture.  All port sites were  injected with 0.25% Marcaine and reapproximated at the skin level with  staples.  Sterile dressings were applied.  The patient appeared to  tolerate the  procedure well and without complications.  He was able to  be extubated and transferred to the recovery unit in satisfactory  condition.      Heloise Purpura, MD  Electronically Signed     LB/MEDQ  D:  10/03/2007  T:  10/04/2007  Job:  [id number]

## 2011-03-08 NOTE — Assessment & Plan Note (Signed)
[facility]                        [place] OFFICE NOTE   Jayme, Cham ROYSTON BEKELE                     MRN:          [id number]  DATE:03/23/2007                            DOB:          [birth year]    REASON FOR VISIT:  1. Blood pressure check. Mr. Belsky presents today for his      hypertension check up. At the last visit, he had his      hydrochlorothiazide increased to 25 mg daily. He reports that he is      tolerating it well and has not had any side effects from the      medication.  2. In regards to his cholesterol, the patient does report that he      needs a refill on the Niaspan which was increased to 1000 mg      nightly.   MEDICATIONS:  Please see the medication list.   ALLERGIES:  PENICILLIN and ASPIRIN.   REVIEW OF SYSTEMS:  As per HPI.   OBJECTIVE:  VITAL SIGNS:  Weight 203.0, temperature 98.1, pulse 72,  respiratory rate 18, blood pressure 118/84.  GENERAL:  Pleasant male in no acute distress, questions appropriately.  NECK:  Supple, no lymphadenopathy, carotid bruits, or JVD.  LUNGS:  Clear.  HEART:  Regular rate and rhythm, normal S1, S2, no murmurs, rubs, or  gallops.  EXTREMITIES:  No cyanosis, clubbing, or edema.   IMPRESSION:  1. Hypertension, at goal.  2. Hyperlipidemia.   PLAN:  1. The patient was scheduled for a B-met.  2. I refilled his Niaspan 1000 mg 1 tablet nightly, 90 tablets with 3      refills.  3. The patient is to follow up with me in three months for a complete      physical examination or sooner if need be.     Leanne Chang, M.D.  Electronically Signed    LA/MedQ  DD: 03/23/2007  DT: 03/24/2007  Job #: [id number]

## 2011-03-10 ENCOUNTER — Encounter: Payer: Self-pay | Admitting: Internal Medicine

## 2011-03-11 ENCOUNTER — Encounter: Payer: Self-pay | Admitting: Internal Medicine

## 2011-03-11 ENCOUNTER — Ambulatory Visit (INDEPENDENT_AMBULATORY_CARE_PROVIDER_SITE_OTHER): Payer: Medicare Other | Admitting: Internal Medicine

## 2011-03-11 DIAGNOSIS — R7309 Other abnormal glucose: Secondary | ICD-10-CM

## 2011-03-11 DIAGNOSIS — E785 Hyperlipidemia, unspecified: Secondary | ICD-10-CM

## 2011-03-11 DIAGNOSIS — Z0000 Encounter for general adult medical examination without abnormal findings: Secondary | ICD-10-CM | POA: Insufficient documentation

## 2011-03-11 DIAGNOSIS — G4700 Insomnia, unspecified: Secondary | ICD-10-CM

## 2011-03-11 DIAGNOSIS — C61 Malignant neoplasm of prostate: Secondary | ICD-10-CM

## 2011-03-11 DIAGNOSIS — I10 Essential (primary) hypertension: Secondary | ICD-10-CM

## 2011-03-11 LAB — CBC WITH DIFFERENTIAL/PLATELET
Basophils Absolute: 0 10*3/uL (ref 0.0–0.1)
Eosinophils Relative: 2.5 % (ref 0.0–5.0)
Lymphocytes Relative: 22.6 % (ref 12.0–46.0)
Monocytes Relative: 8.2 % (ref 3.0–12.0)
Neutrophils Relative %: 66.2 % (ref 43.0–77.0)
Platelets: 246 10*3/uL (ref 150.0–400.0)
RDW: 13.5 % (ref 11.5–14.6)
WBC: 5.1 10*3/uL (ref 4.5–10.5)

## 2011-03-11 LAB — LIPID PANEL
Cholesterol: 165 mg/dL (ref 0–200)
HDL: 54.8 mg/dL (ref >39.00)
LDL Cholesterol: 74 mg/dL (ref 0–99)
Triglycerides: 181 mg/dL — ABNORMAL HIGH (ref 0.0–149.0)
VLDL: 36.2 mg/dL (ref 0.0–40.0)

## 2011-03-11 LAB — BASIC METABOLIC PANEL
BUN: 15 mg/dL (ref 6–23)
Calcium: 9.1 mg/dL (ref 8.4–10.5)
Creatinine, Ser: 0.8 mg/dL (ref 0.4–1.5)
GFR: 100.92 mL/min (ref >60.00)
Potassium: 4.5 mEq/L (ref 3.5–5.1)

## 2011-03-11 NOTE — Assessment & Plan Note (Signed)
Good med compliance, on OTC niacin.labs

## 2011-03-11 NOTE — Assessment & Plan Note (Signed)
Labs

## 2011-03-11 NOTE — Assessment & Plan Note (Signed)
Has not seen urology in ~ 2 years, Dr Lorretta Harp not longer available. Will refer to Dr Laverle Patter who performed his surgery. Seems to be doing well

## 2011-03-11 NOTE — Assessment & Plan Note (Signed)
At goal, labs  

## 2011-03-11 NOTE — Assessment & Plan Note (Signed)
On doxepin, sx well controlled

## 2011-03-11 NOTE — Op Note (Signed)
   NAME:  Larry Elliott, Larry Elliott                        ACCOUNT NO.:  [id number]   MEDICAL RECORD NO.:  [id number]                   PATIENT TYPE:  AMB   LOCATION:  ENDO                                 FACILITY:  [facility]   PHYSICIAN:  James L. Malon Kindle., M.D.          DATE OF BIRTH:  March 26, 1946   DATE OF PROCEDURE:  08/05/2002  DATE OF DISCHARGE:                                 OPERATIVE REPORT   PROCEDURE:  Colonoscopy and polypectomy.   MEDICATIONS:  Fentanyl 100 mcg, Versed 8 mg IV.   INDICATIONS FOR PROCEDURE:  Colon cancer screening, recent diarrhea.   DESCRIPTION OF PROCEDURE:  The procedure had been explained to the patient  and consent obtained. With the patient in the left lateral decubitus  position,a digital exam was performed and the Olympus adult scope inserted  and advanced under direct visualization. The prep was excellent. We were  able to reach the cecum without difficulty. The ileocecal valve and  appendiceal orifice were seen. The bottom of the cecum near the appendiceal  orifice approximately 3 or 4 cm away was a sessile polyp that was removed  with the snare and sucked through the scope. The remainder of the cecum,  ascending colon, hepatic flexure, transverse colon, splenic flexure,  descending, and sigmoid colon were seen well upon removal. No other polyps  were seen. There was mild diverticulosis. The scope was withdrawn. The  patient tolerated the procedure well.   ASSESSMENT:  1. Cecal polyp removed.  2. Mild diverticulosis.   PLAN:  Will check path and will likely need repeat colonoscopy.                                                James L. Malon Kindle., M.D.    Larry Elliott  D:  08/05/2002  T:  08/05/2002  Job:  [id number]   cc:   Thelma Barge P. [place]esto Charon, M.D.

## 2011-03-11 NOTE — Assessment & Plan Note (Addendum)
Td 2009 pneumonia shot 2009 counseled diet-exercise  Colonoscopy 2003 , and 09-2010(tubular adenoma, 5 years). Dr Randa Evens Refer to urology

## 2011-03-11 NOTE — Progress Notes (Signed)
  Subjective:    Patient ID: Larry Elliott, male    DOB: Oct 14, 1946, 65 y.o.   MRN: [id number]  HPI Here for Medicare AWV:  1. Risk factors based on Past M, S, F history: reviewed 2. Physical Activities: active, yard work, no routine exercise 3. Depression/mood:  No problems noted or reported  4. Hearing:  No problems noted or reported  5. ADL's:  Independent  6. Fall Risk: low risk  7. home Safety: does feelsafe at home  8. Height, weight, &visual acuity: see VS, vision well corrected w/ glasses  9. Counseling: provided 10. Labs ordered based on risk factors: if needed  11. Referral Coordination: if needed 12.  Care Plan, see assessment and plan  13.   Cognitive Assessment: Motor skills and cognition seems normal  In addition, today we discussed the following: Insomnia-- doxepin helps well HTN-- no amb BP, good med compliance  Cholesterol-- good med compliance w/ statins and niacin  Past Medical History  Diagnosis Date  . Melanoma 2010    face  . Diverticulosis     colon  . Hyperlipidemia   . Hypertension   . Insomnia   . Prostate ca 10/08    s/p robotic surgery  . Meniscus tear   . CAD (coronary artery disease)     CP, cath, non-obstructive   Past Surgical History  Procedure Date  . Appendectomy   . Hernia repair     Family History: mother  passed away at age 65 -- stroke  brain aneurysm (?) --F CAD - F HTN - ? DM - no colon Ca - no prostate Ca - no  Social History: Occupation:retired Emergency planning/management officer, part time work now @ [employer] Married 2 children Never Smoked Alcohol use-yes Regular exercise- no diet-- "ok", wife takes care of it   Review of Systems  Respiratory: Negative for cough and shortness of breath.   Cardiovascular: Negative for chest pain and leg swelling.  Gastrointestinal: Negative for nausea, abdominal pain, diarrhea, constipation and blood in stool.  Genitourinary: Negative for dysuria, urgency and difficulty urinating.         Objective:   Physical Exam  Constitutional: He is oriented to person, place, and time. He appears well-developed and well-nourished.  HENT:  Head: Normocephalic and atraumatic.  Neck: No thyromegaly present.  Cardiovascular: Normal rate, regular rhythm and normal heart sounds.   No murmur heard. Pulmonary/Chest: Effort normal and breath sounds normal. No respiratory distress. He has no wheezes. He has no rales.  Abdominal: Soft. Bowel sounds are normal. He exhibits no distension. There is no tenderness. There is no rebound and no guarding.  Neurological: He is alert and oriented to person, place, and time.  Skin: Skin is warm and dry.  Psychiatric: He has a normal mood and affect. His behavior is normal. Judgment and thought content normal.          Assessment & Plan:

## 2011-05-06 ENCOUNTER — Ambulatory Visit (INDEPENDENT_AMBULATORY_CARE_PROVIDER_SITE_OTHER)
Admission: RE | Admit: 2011-05-06 | Discharge: 2011-05-06 | Disposition: A | Discharge status: Home / Self Care | Payer: Medicare Other | Source: Ambulatory Visit | Attending: Internal Medicine | Admitting: Internal Medicine

## 2011-05-06 ENCOUNTER — Encounter: Payer: Self-pay | Admitting: Internal Medicine

## 2011-05-06 ENCOUNTER — Ambulatory Visit (INDEPENDENT_AMBULATORY_CARE_PROVIDER_SITE_OTHER): Payer: Medicare Other | Admitting: Internal Medicine

## 2011-05-06 VITALS — BP 130/84 | HR 69 | Wt 191.6 lb

## 2011-05-06 DIAGNOSIS — M25569 Pain in unspecified knee: Secondary | ICD-10-CM

## 2011-05-06 DIAGNOSIS — M25669 Stiffness of unspecified knee, not elsewhere classified: Secondary | ICD-10-CM

## 2011-05-06 DIAGNOSIS — M1990 Unspecified osteoarthritis, unspecified site: Secondary | ICD-10-CM

## 2011-05-06 MED ORDER — HYDROCODONE-ACETAMINOPHEN 7.5-750 MG PO TABS: 1.0000 | ORAL_TABLET | ORAL | Status: DC | PRN

## 2011-05-06 NOTE — Patient Instructions (Signed)
Knee sleeve XR Ice Tylenol for pain, if pain severe -------> use hydrocodone instead (will cause drowsiness)

## 2011-05-06 NOTE — Progress Notes (Signed)
  Subjective:    Patient ID: KOLE HILYARD, male    DOB: March 25, 1946, 65 y.o.   MRN: [id number]  HPI Was working at the auto auction  2 days ago, did a lot of standing and suddenly his left knee felt stiff, started to hurt, occasionally got locked, was unable to fully flex the knee. He works at the [employer] frequently and never had a problem, no injury, no recent overuse.  Past Medical History  Diagnosis Date  . Melanoma 2010    face  . Diverticulosis     colon  . Hyperlipidemia   . Hypertension   . Insomnia   . Prostate ca 10/08    s/p robotic surgery  . Meniscus tear   . CAD (coronary artery disease)     CP, cath, non-obstructive   Past Surgical History  Procedure Date  . Appendectomy   . Hernia repair      Review of Systems Denies fevers or a rash around the knee. No actual swelling in the knee    Objective:   Physical Exam Alert, oriented x3. Right knee normal, has a scar from previous surgery in the 60s. Left knee: No effusion, no warmness, she has some changes consistent with DJD. The joint is stable, has some discomfort with medial lateralization. Passive range of motion: Slightly decreased when I tried to Hyperflex the knee .       Assessment & Plan:

## 2011-05-06 NOTE — Assessment & Plan Note (Addendum)
Knee pain likely a DJD flare up, internal derangement? Plan: see instruction , ortho ref

## 2011-05-20 ENCOUNTER — Other Ambulatory Visit: Payer: Self-pay | Admitting: Internal Medicine

## 2011-05-20 NOTE — Telephone Encounter (Signed)
40, no RF

## 2011-05-23 ENCOUNTER — Other Ambulatory Visit: Payer: Self-pay | Admitting: Internal Medicine

## 2011-05-23 MED ORDER — DOXEPIN HCL 50 MG PO CAPS: 50.0000 mg | ORAL_CAPSULE | Freq: Every day | ORAL | Status: DC

## 2011-07-11 ENCOUNTER — Other Ambulatory Visit: Payer: Self-pay | Admitting: Internal Medicine

## 2011-07-11 MED ORDER — LOVASTATIN 20 MG PO TABS: 20.0000 mg | ORAL_TABLET | Freq: Every day | ORAL | Status: DC

## 2011-07-11 MED ORDER — ENALAPRIL MALEATE 5 MG PO TABS: 5.0000 mg | ORAL_TABLET | Freq: Every day | ORAL | Status: DC

## 2011-07-11 MED ORDER — HYDROCHLOROTHIAZIDE 25 MG PO TABS: 25.0000 mg | ORAL_TABLET | Freq: Every day | ORAL | Status: DC

## 2011-07-11 NOTE — Telephone Encounter (Signed)
rx sent into pharmacy

## 2011-08-01 LAB — BASIC METABOLIC PANEL
CO2: 27
Chloride: 103
GFR calc Af Amer: >60
Sodium: 139

## 2011-08-01 LAB — CBC
Hemoglobin: 15.3
MCHC: 34.8
MCV: 91.6
RBC: 4.81
WBC: 5

## 2011-08-01 LAB — DIFFERENTIAL
Basophils Relative: 0
Lymphs Abs: 1.6
Monocytes Absolute: 0.5
Monocytes Relative: 11
Neutro Abs: 2.7
Neutrophils Relative %: 53

## 2011-08-01 LAB — HEMOGLOBIN AND HEMATOCRIT, BLOOD
HCT: 36.6 — ABNORMAL LOW
Hemoglobin: 12.8 — ABNORMAL LOW
Hemoglobin: 12.9 — ABNORMAL LOW

## 2011-08-01 LAB — TYPE AND SCREEN: Antibody Screen: NEGATIVE

## 2011-08-02 ENCOUNTER — Other Ambulatory Visit: Payer: Self-pay | Admitting: *Deleted

## 2011-08-02 MED ORDER — HYDROCHLOROTHIAZIDE 25 MG PO TABS: 25.0000 mg | ORAL_TABLET | Freq: Every day | ORAL | Status: DC

## 2011-08-02 MED ORDER — ENALAPRIL MALEATE 5 MG PO TABS: 5.0000 mg | ORAL_TABLET | Freq: Every day | ORAL | Status: DC

## 2011-08-02 MED ORDER — LOVASTATIN 20 MG PO TABS: 20.0000 mg | ORAL_TABLET | Freq: Every day | ORAL | Status: DC

## 2011-08-02 MED ORDER — DOXEPIN HCL 50 MG PO CAPS: 50.0000 mg | ORAL_CAPSULE | Freq: Every day | ORAL | Status: DC

## 2011-09-05 ENCOUNTER — Other Ambulatory Visit: Payer: Self-pay | Admitting: Internal Medicine

## 2011-09-06 ENCOUNTER — Other Ambulatory Visit: Payer: Self-pay

## 2011-09-07 MED ORDER — ENALAPRIL MALEATE 5 MG PO TABS: 5.0000 mg | ORAL_TABLET | Freq: Every day | ORAL | Status: DC

## 2011-09-07 MED ORDER — DOXEPIN HCL 50 MG PO CAPS: 50.0000 mg | ORAL_CAPSULE | Freq: Every day | ORAL | Status: DC

## 2011-09-07 MED ORDER — LOVASTATIN 20 MG PO TABS: 20.0000 mg | ORAL_TABLET | Freq: Every day | ORAL | Status: DC

## 2011-09-07 MED ORDER — HYDROCHLOROTHIAZIDE 25 MG PO TABS: 25.0000 mg | ORAL_TABLET | Freq: Every day | ORAL | Status: DC

## 2011-09-07 NOTE — Telephone Encounter (Signed)
Refills done.

## 2011-11-01 DIAGNOSIS — Z8546 Personal history of malignant neoplasm of prostate: Secondary | ICD-10-CM | POA: Diagnosis not present

## 2011-11-11 ENCOUNTER — Ambulatory Visit: Payer: No Typology Code available for payment source | Admitting: Internal Medicine

## 2011-11-17 ENCOUNTER — Encounter: Payer: Self-pay | Admitting: Internal Medicine

## 2011-11-17 ENCOUNTER — Ambulatory Visit (INDEPENDENT_AMBULATORY_CARE_PROVIDER_SITE_OTHER): Payer: Medicare Other | Admitting: Internal Medicine

## 2011-11-17 VITALS — BP 128/86 | HR 69 | Temp 97.8°F | Wt 197.0 lb

## 2011-11-17 DIAGNOSIS — I10 Essential (primary) hypertension: Secondary | ICD-10-CM

## 2011-11-17 DIAGNOSIS — E785 Hyperlipidemia, unspecified: Secondary | ICD-10-CM

## 2011-11-17 DIAGNOSIS — G4700 Insomnia, unspecified: Secondary | ICD-10-CM

## 2011-11-17 LAB — BASIC METABOLIC PANEL
CO2: 29 mEq/L (ref 19–32)
Chloride: 104 mEq/L (ref 96–112)
Creatinine, Ser: 0.7 mg/dL (ref 0.4–1.5)
Potassium: 4 mEq/L (ref 3.5–5.1)

## 2011-11-17 MED ORDER — HYDROCHLOROTHIAZIDE 25 MG PO TABS: 25.0000 mg | ORAL_TABLET | Freq: Every day | ORAL | Status: DC

## 2011-11-17 MED ORDER — ENALAPRIL MALEATE 5 MG PO TABS: 5.0000 mg | ORAL_TABLET | Freq: Every day | ORAL | Status: DC

## 2011-11-17 MED ORDER — LOVASTATIN 20 MG PO TABS: 20.0000 mg | ORAL_TABLET | Freq: Every day | ORAL | Status: DC

## 2011-11-17 MED ORDER — DOXEPIN HCL 50 MG PO CAPS: 50.0000 mg | ORAL_CAPSULE | Freq: Every day | ORAL | Status: DC

## 2011-11-17 NOTE — Progress Notes (Signed)
  Subjective:    Patient ID: Larry Elliott, male    DOB: 05/19/1946, 66 y.o.   MRN: [id number]  HPI ROV Feels well, no concerns, his knee was operated on last year and is feeling better. Still has some nocturnal pain. Taking over-the-counter melatonin and it helps  him sleep.     Past Medical History: Hyperlipidemia Hypertension Insomnia  8-2011CP, Cath non-obstrutive CAD MELANOMA  Dx 2010 (face) Diverticulosis, colon Prostate cancer   Past Surgical History: Prostate surgery, robotic,  for prostate cancer 07-2007 Appendectomy Hernia L knee arthroscopy meniscal tear  Review of Systems His medication list is reviewed, good compliance. Needs refills. No ambulatory blood pressures Recently saw Dr. Laverle Patter in reference to his prostate cancer, he is doing great. He remains active and eating well.      Objective:   Physical Exam  Constitutional: He is oriented to person, place, and time. He appears well-developed and well-nourished.  HENT:  Head: Normocephalic and atraumatic.  Cardiovascular: Regular rhythm and normal heart sounds.   No murmur heard. Pulmonary/Chest: Effort normal and breath sounds normal. No respiratory distress. He has no wheezes. He has no rales.  Musculoskeletal: He exhibits no edema.  Neurological: He is alert and oriented to person, place, and time. No cranial nerve deficit. Coordination normal.      Assessment & Plan:

## 2011-11-17 NOTE — Assessment & Plan Note (Signed)
On doxepin and also taking over-the-counter melatonin

## 2011-11-17 NOTE — Assessment & Plan Note (Signed)
Well controlled, no change. Labs 

## 2011-11-17 NOTE — Assessment & Plan Note (Signed)
Good medicine compliance, no change

## 2011-12-15 DIAGNOSIS — Z85820 Personal history of malignant melanoma of skin: Secondary | ICD-10-CM | POA: Diagnosis not present

## 2011-12-15 DIAGNOSIS — D485 Neoplasm of uncertain behavior of skin: Secondary | ICD-10-CM | POA: Diagnosis not present

## 2011-12-15 DIAGNOSIS — L819 Disorder of pigmentation, unspecified: Secondary | ICD-10-CM | POA: Diagnosis not present

## 2011-12-15 DIAGNOSIS — D239 Other benign neoplasm of skin, unspecified: Secondary | ICD-10-CM | POA: Diagnosis not present

## 2011-12-15 DIAGNOSIS — C44519 Basal cell carcinoma of skin of other part of trunk: Secondary | ICD-10-CM | POA: Diagnosis not present

## 2011-12-15 DIAGNOSIS — L570 Actinic keratosis: Secondary | ICD-10-CM | POA: Diagnosis not present

## 2011-12-15 DIAGNOSIS — L259 Unspecified contact dermatitis, unspecified cause: Secondary | ICD-10-CM | POA: Diagnosis not present

## 2012-02-02 DIAGNOSIS — C44519 Basal cell carcinoma of skin of other part of trunk: Secondary | ICD-10-CM | POA: Diagnosis not present

## 2012-03-22 ENCOUNTER — Encounter: Payer: Self-pay | Admitting: Internal Medicine

## 2012-03-22 ENCOUNTER — Ambulatory Visit (INDEPENDENT_AMBULATORY_CARE_PROVIDER_SITE_OTHER): Payer: Medicare Other | Admitting: Internal Medicine

## 2012-03-22 VITALS — BP 108/76 | HR 63 | Temp 97.7°F | Ht 70.0 in | Wt 193.0 lb

## 2012-03-22 DIAGNOSIS — G4700 Insomnia, unspecified: Secondary | ICD-10-CM | POA: Diagnosis not present

## 2012-03-22 DIAGNOSIS — E785 Hyperlipidemia, unspecified: Secondary | ICD-10-CM

## 2012-03-22 DIAGNOSIS — I10 Essential (primary) hypertension: Secondary | ICD-10-CM

## 2012-03-22 DIAGNOSIS — Z0000 Encounter for general adult medical examination without abnormal findings: Secondary | ICD-10-CM

## 2012-03-22 DIAGNOSIS — R7309 Other abnormal glucose: Secondary | ICD-10-CM | POA: Diagnosis not present

## 2012-03-22 DIAGNOSIS — C61 Malignant neoplasm of prostate: Secondary | ICD-10-CM

## 2012-03-22 LAB — LIPID PANEL
Cholesterol: 176 mg/dL (ref 0–200)
Total CHOL/HDL Ratio: 3

## 2012-03-22 LAB — LDL CHOLESTEROL, DIRECT: Direct LDL: 85.2 mg/dL

## 2012-03-22 LAB — COMPREHENSIVE METABOLIC PANEL
Albumin: 4.1 g/dL (ref 3.5–5.2)
Alkaline Phosphatase: 44 U/L (ref 39–117)
CO2: 28 mEq/L (ref 19–32)
Glucose, Bld: 88 mg/dL (ref 70–99)
Potassium: 3.6 mEq/L (ref 3.5–5.1)
Sodium: 140 mEq/L (ref 135–145)
Total Protein: 6.7 g/dL (ref 6.0–8.3)

## 2012-03-22 NOTE — Progress Notes (Signed)
  Subjective:    Patient ID: Larry Elliott, male    DOB: Jul 03, 1946, 66 y.o.   MRN: [id number]  HPI  Here for Medicare AWV: 1. Risk factors based on Past M, S, F history: reviewed 2. Physical Activities: active, yard work, no routine exercise 3. Depression/mood: No problems noted or reported   4. Hearing:  No problems noted or reported   5. ADL's:  Independent   6. Fall Risk: low risk , prevention   7. home Safety: does feelsafe at home   8. Height, weight, &visual acuity: see VS, vision well corrected w/ glasses   9. Counseling: provided 10. Labs ordered based on risk factors: if needed   11. Referral Coordination: if needed 12.  Care Plan, see assessment and plan   13.   Cognitive Assessment: Motor skills and cognition seems normal  In addition, today we discussed the following: Insomnia-- doxepin helps well, no s/e, not sleeping the next AM HTN-- no amb BP, good med compliance . BP 108/76 today, denies feeling weak dizzy or fatigued at times. Cholesterol-- good med compliance w/ statins and niacin , occ flushes  Past Medical History: Hyperlipidemia Hypertension Insomnia   8-2011CP, Cath non-obstrutive CAD MELANOMA  Dx 2010 (face) Diverticulosis, colon Prostate cancer   Past Surgical History: Prostate surgery, robotic,  for prostate cancer 07-2007 Appendectomy Hernia L knee arthroscopy meniscal tear  Past Medical History: Hyperlipidemia Hypertension Insomnia   8-2011CP, Cath non-obstrutive CAD MELANOMA  Dx 2010 (face) Diverticulosis, colon Prostate cancer  Past Surgical History: Prostate surgery, robotic,  for prostate cancer 07-2007 Appendectomy Hernia L knee arthroscopy meniscal tear  Family History: mother  passed away at age 66 -- stroke   brain aneurysm (?) --F CAD - F, no MI HTN - ? 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 No blood in the stools No dysuria, gross hematuria. No problems with urinary incontinence.    Objective:   Physical Exam  General -- alert, well-developed, and well-nourished.   Neck --no thyromegaly Lungs -- normal respiratory effort, no intercostal retractions, no accessory muscle use, and normal breath sounds.   Heart-- normal rate, regular rhythm, no murmur, and no gallop.   Abdomen--soft, non-tender, no distention, no masses, no HSM, no guarding, and no rigidity.   Extremities-- no pretibial edema bilaterally Neurologic-- alert & oriented X3 and strength normal in all extremities. Psych-- Cognition and judgment appear intact. Alert and cooperative with normal attention span and concentration.  not anxious appearing and not depressed appearing.       Assessment & Plan:

## 2012-03-22 NOTE — Assessment & Plan Note (Signed)
Td 2009 pneumonia shot 2009 zostavax 2011  counseled diet-exercise  Colonoscopy 2003 , and 09-2010(tubular adenoma, 5 years). Dr Graciella Freer urology once a year

## 2012-03-22 NOTE — Assessment & Plan Note (Signed)
Very well controlled, no change, check a BMP

## 2012-03-22 NOTE — Assessment & Plan Note (Signed)
Refilled doxepin as needed.

## 2012-03-22 NOTE — Assessment & Plan Note (Signed)
Followup by urology yearly

## 2012-03-22 NOTE — Assessment & Plan Note (Signed)
Good compliance with medicines, check a FLP

## 2012-03-22 NOTE — Assessment & Plan Note (Signed)
A1c 5.6 twice, recheck next year

## 2012-03-22 NOTE — Patient Instructions (Signed)
Return to the office in in one year for your physical exam. Come back anytime if you have questions or concerns

## 2012-03-26 ENCOUNTER — Encounter: Payer: Self-pay | Admitting: *Deleted

## 2012-11-29 ENCOUNTER — Other Ambulatory Visit: Payer: Self-pay | Admitting: Internal Medicine

## 2012-11-29 NOTE — Telephone Encounter (Signed)
Refill done.  Pt has future appt scheduled 5.30.14.

## 2012-12-14 ENCOUNTER — Ambulatory Visit (INDEPENDENT_AMBULATORY_CARE_PROVIDER_SITE_OTHER)
Admission: RE | Admit: 2012-12-14 | Discharge: 2012-12-14 | Disposition: A | Discharge status: Home / Self Care | Payer: Medicare Other | Source: Ambulatory Visit | Attending: Internal Medicine | Admitting: Internal Medicine

## 2012-12-14 ENCOUNTER — Ambulatory Visit (INDEPENDENT_AMBULATORY_CARE_PROVIDER_SITE_OTHER): Payer: Medicare Other | Admitting: Internal Medicine

## 2012-12-14 VITALS — BP 110/76 | HR 64 | Temp 97.9°F | Wt 192.0 lb

## 2012-12-14 DIAGNOSIS — M25579 Pain in unspecified ankle and joints of unspecified foot: Secondary | ICD-10-CM

## 2012-12-14 NOTE — Patient Instructions (Addendum)
Please get your x-ray at the other [place]  office located at: [address] [place], across from [facility].  Please go to the basement, this is a walk-in facility, they are open from 8:30 to 5:30 PM. Phone number [phone]. -- Tylenol  500 mg OTC 2 tabs a day every 8 hours as needed for pain Call if the pain no better in 2-3 weeks

## 2012-12-14 NOTE — Progress Notes (Signed)
  Subjective:    Patient ID: Larry Elliott, male    DOB: 1946/07/29, 67 y.o.   MRN: [id number]  HPI Acute visit Complaining of pain in the external aspect of the right food, start a few weeks ago, the pain is worse when he steps in certain ways and after he takes long walks. No obvious injury. He has taken Tylenol from time to time which seems to help.  Past Medical History: Hyperlipidemia Hypertension Insomnia   8-2011CP, Cath non-obstrutive CAD MELANOMA  Dx 2010 (face) Diverticulosis, colon Prostate cancer   Past Surgical History: Prostate surgery, robotic,  for prostate cancer 07-2007 Appendectomy Hernia L knee arthroscopy meniscal tear  Family History: mother  passed away at age 54 -- stroke   brain aneurysm (?) --F CAD - F, no MI HTN - ? 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 No swelling or redness. Has not been doing more walking than usual.     Objective:   Physical Exam  Musculoskeletal:       Feet:    General -- alert, well-developed  Extremities-- no pretibial edema bilaterally Left foot normal. Right foot normal to inspection, no redness, swelling, no actual tenderness at the site of the perceived pain. Ankle is full range of motion with no pain. No pain to palpation of the heel at the base of the toes.  Psych-- Cognition and judgment appear intact. Alert and cooperative with normal attention span and concentration.  not anxious appearing and not depressed appearing.       Assessment & Plan:

## 2012-12-14 NOTE — Assessment & Plan Note (Signed)
Mild right foot pain, exam is benign except for some tenderness. he's allergic to aspirin. Plan: X-ray to rule out a stress fracture otherwise will await few weeks, if not improving he will call for orthopedic referral.

## 2012-12-15 ENCOUNTER — Encounter: Payer: Self-pay | Admitting: Internal Medicine

## 2013-02-19 ENCOUNTER — Ambulatory Visit (INDEPENDENT_AMBULATORY_CARE_PROVIDER_SITE_OTHER): Payer: Medicare Other | Admitting: Internal Medicine

## 2013-02-19 ENCOUNTER — Ambulatory Visit (INDEPENDENT_AMBULATORY_CARE_PROVIDER_SITE_OTHER)
Admission: RE | Admit: 2013-02-19 | Discharge: 2013-02-19 | Disposition: A | Discharge status: Home / Self Care | Payer: Medicare Other | Source: Ambulatory Visit | Attending: Internal Medicine | Admitting: Internal Medicine

## 2013-02-19 ENCOUNTER — Other Ambulatory Visit: Payer: Medicare Other

## 2013-02-19 VITALS — BP 130/90 | HR 80 | Temp 98.2°F | Ht 70.0 in | Wt 189.0 lb

## 2013-02-19 DIAGNOSIS — M169 Osteoarthritis of hip, unspecified: Secondary | ICD-10-CM | POA: Diagnosis not present

## 2013-02-19 DIAGNOSIS — I10 Essential (primary) hypertension: Secondary | ICD-10-CM | POA: Diagnosis not present

## 2013-02-19 DIAGNOSIS — M1990 Unspecified osteoarthritis, unspecified site: Secondary | ICD-10-CM

## 2013-02-19 DIAGNOSIS — E785 Hyperlipidemia, unspecified: Secondary | ICD-10-CM

## 2013-02-19 DIAGNOSIS — Z0000 Encounter for general adult medical examination without abnormal findings: Secondary | ICD-10-CM

## 2013-02-19 MED ORDER — HYDROCODONE-ACETAMINOPHEN 5-325 MG PO TABS: 1.0000 | ORAL_TABLET | ORAL | Status: DC | PRN

## 2013-02-19 NOTE — Progress Notes (Signed)
  Subjective:    Patient ID: Larry Elliott, male    DOB: 1946/04/26, 67 y.o.   MRN: [id number]  HPI ROV (not due for a CPX)  Chief complaint is pain, going on for 3 months, located at the left hip and associated to a deep ache in the left groin. Pain is day or night, worse if he rolls over in bed over the left side.Denies any injury or fall. No  rash. Occasional numbness at the lateral aspect of the left thigh.  High cholesterol, good medication compliance Hypertension, good medication compliance, no frequent ambulatory BPs however BP was 130/ 80 when he went to the dentist.  Past Medical History  Diagnosis Date  . Melanoma 2010    face  . Diverticulosis     colon  . Hyperlipidemia   . Hypertension   . Insomnia   . Prostate ca 10/08    s/p robotic surgery  . CAD (coronary artery disease)     CP, cath, non-obstructive   Past Surgical History  Procedure Laterality Date  . Appendectomy    . Hernia repair    . Knee arthroscopy Left     meniscal tear  . Prostatectomy  07-2007     robotic,  for prostate cancer       Family History: mother  passed away at age 46 -- stroke   brain aneurysm (?) --F CAD - F, no MI HTN - ? 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 Note nausea, vomiting, diarrhea. No bulging area of the groin, no abnormalities on self genital exam.     Objective:   Physical Exam General -- alert, well-developed, No apparent distress   Abdomen--soft, non-tender, no distention, no masses, no HSM, no guarding, and no rigidity.   Back --no tender to palpation Extremities-- no pretibial edema bilaterally. Not tender in the trochanteric bursa on either side. Hips are full range of motion, some pain generated with rotation of the left hip. GU-- Normal penis ;  Scrotal exam: Normal right testicle, left side w/ a soft large mass c/w most likely a  cyst. See below. No inguinal hernia . Neurologic-- alert & oriented X3, Motor normal, DTRs symmetric in the knees and symmetrically decreased ankle jerks.Marland Kitchen Psych-- Cognition and judgment appear intact. Alert and cooperative with normal attention span and concentration.  not anxious appearing and not depressed appearing.        Assessment & Plan:

## 2013-02-19 NOTE — Assessment & Plan Note (Signed)
Improving.

## 2013-02-19 NOTE — Assessment & Plan Note (Signed)
Due for labs, good medication compliance 

## 2013-02-19 NOTE — Assessment & Plan Note (Signed)
Seems  well-controlled, labs 

## 2013-02-19 NOTE — Assessment & Plan Note (Addendum)
Left hip pain and maybe meralgia paresthetica; also complains of left groin pain, no evidence of any GU problem or hernia. Left testicular exam is abnormal, I review the urology notes, this is not a new finding. Sx likely DJD of hip Plan: X-ray,ortho referral. Tylenol for day time pain, vicodin at night

## 2013-02-20 ENCOUNTER — Other Ambulatory Visit: Payer: Medicare Other

## 2013-02-20 ENCOUNTER — Encounter: Payer: Self-pay | Admitting: Internal Medicine

## 2013-02-20 LAB — COMPREHENSIVE METABOLIC PANEL
ALT: 26 U/L (ref 0–53)
CO2: 26 mEq/L (ref 19–32)
Calcium: 9.1 mg/dL (ref 8.4–10.5)
Chloride: 103 mEq/L (ref 96–112)
Creatinine, Ser: 0.6 mg/dL (ref 0.4–1.5)
GFR: 131.69 mL/min (ref >60.00)
Glucose, Bld: 77 mg/dL (ref 70–99)
Total Bilirubin: 0.8 mg/dL (ref 0.3–1.2)
Total Protein: 7.1 g/dL (ref 6.0–8.3)

## 2013-02-20 LAB — CBC WITH DIFFERENTIAL/PLATELET
Basophils Relative: 0.5 % (ref 0.0–3.0)
Eosinophils Absolute: 0.2 10*3/uL (ref 0.0–0.7)
Eosinophils Relative: 2.7 % (ref 0.0–5.0)
HCT: 40.8 % (ref 39.0–52.0)
Lymphs Abs: 1.2 10*3/uL (ref 0.7–4.0)
MCHC: 34.6 g/dL (ref 30.0–36.0)
MCV: 93.4 fl (ref 78.0–100.0)
Monocytes Absolute: 0.6 10*3/uL (ref 0.1–1.0)
Neutro Abs: 4.9 10*3/uL (ref 1.4–7.7)
RBC: 4.37 Mil/uL (ref 4.22–5.81)
WBC: 6.9 10*3/uL (ref 4.5–10.5)

## 2013-02-20 LAB — LIPID PANEL
Cholesterol: 138 mg/dL (ref 0–200)
HDL: 61.4 mg/dL (ref >39.00)

## 2013-02-28 ENCOUNTER — Other Ambulatory Visit: Payer: Self-pay | Admitting: Internal Medicine

## 2013-02-28 DIAGNOSIS — M169 Osteoarthritis of hip, unspecified: Secondary | ICD-10-CM | POA: Diagnosis not present

## 2013-02-28 DIAGNOSIS — M25559 Pain in unspecified hip: Secondary | ICD-10-CM | POA: Diagnosis not present

## 2013-02-28 NOTE — Telephone Encounter (Signed)
Refill done.  

## 2013-03-02 ENCOUNTER — Other Ambulatory Visit: Payer: Self-pay | Admitting: Internal Medicine

## 2013-03-04 NOTE — Telephone Encounter (Signed)
Refill done.  

## 2013-03-07 DIAGNOSIS — M25559 Pain in unspecified hip: Secondary | ICD-10-CM | POA: Diagnosis not present

## 2013-03-14 DIAGNOSIS — M25559 Pain in unspecified hip: Secondary | ICD-10-CM | POA: Diagnosis not present

## 2013-03-14 DIAGNOSIS — M76899 Other specified enthesopathies of unspecified lower limb, excluding foot: Secondary | ICD-10-CM | POA: Diagnosis not present

## 2013-03-14 DIAGNOSIS — M169 Osteoarthritis of hip, unspecified: Secondary | ICD-10-CM | POA: Diagnosis not present

## 2013-03-21 DIAGNOSIS — M25559 Pain in unspecified hip: Secondary | ICD-10-CM | POA: Diagnosis not present

## 2013-03-21 DIAGNOSIS — M169 Osteoarthritis of hip, unspecified: Secondary | ICD-10-CM | POA: Diagnosis not present

## 2013-03-22 ENCOUNTER — Encounter: Payer: Medicare Other | Admitting: Internal Medicine

## 2013-03-28 DIAGNOSIS — M169 Osteoarthritis of hip, unspecified: Secondary | ICD-10-CM | POA: Diagnosis not present

## 2013-03-28 DIAGNOSIS — M25559 Pain in unspecified hip: Secondary | ICD-10-CM | POA: Diagnosis not present

## 2013-04-04 DIAGNOSIS — M25559 Pain in unspecified hip: Secondary | ICD-10-CM | POA: Diagnosis not present

## 2013-05-28 ENCOUNTER — Ambulatory Visit (INDEPENDENT_AMBULATORY_CARE_PROVIDER_SITE_OTHER): Payer: Medicare Other | Admitting: Internal Medicine

## 2013-05-28 VITALS — BP 128/80 | HR 75 | Temp 98.0°F | Ht 69.0 in | Wt 190.6 lb

## 2013-05-28 DIAGNOSIS — E785 Hyperlipidemia, unspecified: Secondary | ICD-10-CM

## 2013-05-28 DIAGNOSIS — R7309 Other abnormal glucose: Secondary | ICD-10-CM | POA: Diagnosis not present

## 2013-05-28 DIAGNOSIS — I10 Essential (primary) hypertension: Secondary | ICD-10-CM

## 2013-05-28 DIAGNOSIS — C61 Malignant neoplasm of prostate: Secondary | ICD-10-CM

## 2013-05-28 DIAGNOSIS — Z0000 Encounter for general adult medical examination without abnormal findings: Secondary | ICD-10-CM | POA: Diagnosis not present

## 2013-05-28 DIAGNOSIS — M1990 Unspecified osteoarthritis, unspecified site: Secondary | ICD-10-CM | POA: Diagnosis not present

## 2013-05-28 DIAGNOSIS — G4700 Insomnia, unspecified: Secondary | ICD-10-CM

## 2013-05-28 MED ORDER — HYDROCODONE-ACETAMINOPHEN 10-325 MG PO TABS: 1.0000 | ORAL_TABLET | Freq: Three times a day (TID) | ORAL | Status: DC | PRN

## 2013-05-28 MED ORDER — PREDNISONE 10 MG PO TABS: ORAL_TABLET | ORAL | Status: DC

## 2013-05-28 NOTE — Assessment & Plan Note (Signed)
Well-controlled per last FLP, no change 

## 2013-05-28 NOTE — Assessment & Plan Note (Addendum)
Td 2009 pneumonia shot 2009 zostavax 2011  counseled diet-exercise  Colonoscopy 2003 , and 09-2010(tubular adenoma, 5 years). Dr Randa Evens

## 2013-05-28 NOTE — Assessment & Plan Note (Addendum)
Severe shoulder pain, shoulder is getting frozen. Suspect internal derangement  Minimize use Tylenol! Pt is allergic to aspirin. Plan: Ortho referral   Prednisone Hydrocodone w/ small tylenol dose

## 2013-05-28 NOTE — Assessment & Plan Note (Signed)
Sx were  controlled until he developed shoulder pain, anticipate will get better once pain is controlled Continue doxepin and a low dose of melatonin 1-2 tablets a day.

## 2013-05-28 NOTE — Patient Instructions (Addendum)
Prednisone as prescribed For pain take only hydrocortisone 10- 325 as prescribed, no more tylenol for now ICE to the shou;der twice a day Continue doxepin, take only one or 2 melatonin at night. Next visit 4 months   Fall Prevention and Home Safety Falls cause injuries and can affect all age groups. It is possible to use preventive measures to significantly decrease the likelihood of falls. There are many simple measures which can make your home safer and prevent falls. OUTDOORS  Repair cracks and edges of walkways and driveways.  Remove high doorway thresholds.  Trim shrubbery on the main path into your home.  Have good outside lighting.  Clear walkways of tools, rocks, debris, and clutter.  Check that handrails are not broken and are securely fastened. Both sides of steps should have handrails.  Have leaves, snow, and ice cleared regularly.  Use sand or salt on walkways during winter months.  In the garage, clean up grease or oil spills. BATHROOM  Install night lights.  Install grab bars by the toilet and in the tub and shower.  Use non-skid mats or decals in the tub or shower.  Place a plastic non-slip stool in the shower to sit on, if needed.  Keep floors dry and clean up all water on the floor immediately.  Remove soap buildup in the tub or shower on a regular basis.  Secure bath mats with non-slip, double-sided rug tape.  Remove throw rugs and tripping hazards from the floors. BEDROOMS  Install night lights.  Make sure a bedside light is easy to reach.  Do not use oversized bedding.  Keep a telephone by your bedside.  Have a firm chair with side arms to use for getting dressed.  Remove throw rugs and tripping hazards from the floor. KITCHEN  Keep handles on pots and pans turned toward the center of the stove. Use back burners when possible.  Clean up spills quickly and allow time for drying.  Avoid walking on wet floors.  Avoid hot utensils and  knives.  Position shelves so they are not too high or low.  Place commonly used objects within easy reach.  If necessary, use a sturdy step stool with a grab bar when reaching.  Keep electrical cables out of the way.  Do not use floor polish or wax that makes floors slippery. If you must use wax, use non-skid floor wax.  Remove throw rugs and tripping hazards from the floor. STAIRWAYS  Never leave objects on stairs.  Place handrails on both sides of stairways and use them. Fix any loose handrails. Make sure handrails on both sides of the stairways are as long as the stairs.  Check carpeting to make sure it is firmly attached along stairs. Make repairs to worn or loose carpet promptly.  Avoid placing throw rugs at the top or bottom of stairways, or properly secure the rug with carpet tape to prevent slippage. Get rid of throw rugs, if possible.  Have an electrician put in a light switch at the top and bottom of the stairs. OTHER FALL PREVENTION TIPS  Wear low-heel or rubber-soled shoes that are supportive and fit well. Wear closed toe shoes.  When using a stepladder, make sure it is fully opened and both spreaders are firmly locked. Do not climb a closed stepladder.  Add color or contrast paint or tape to grab bars and handrails in your home. Place contrasting color strips on first and last steps.  Learn and use mobility aids as  needed. Install an electrical emergency response system.  Turn on lights to avoid dark areas. Replace light bulbs that burn out immediately. Get light switches that glow.  Arrange furniture to create clear pathways. Keep furniture in the same place.  Firmly attach carpet with non-skid or double-sided tape.  Eliminate uneven floor surfaces.  Select a carpet pattern that does not visually hide the edge of steps.  Be aware of all pets. OTHER HOME SAFETY TIPS  Set the water temperature for 120 F (48.8 C).  Keep emergency numbers on or near the  telephone.  Keep smoke detectors on every level of the home and near sleeping areas. Document Released: 09/30/2002 Document Revised: 04/10/2012 Document Reviewed: 12/30/2011 [facility] Patient Information 2014 [place].

## 2013-05-28 NOTE — Assessment & Plan Note (Signed)
Due to see urology, encouraged to call

## 2013-05-28 NOTE — Assessment & Plan Note (Signed)
Recheck a a1c

## 2013-05-28 NOTE — Progress Notes (Signed)
Subjective:    Patient ID: Larry Elliott, male    DOB: November 26, 1945, 67 y.o.   MRN: [id number]  HPI Here for Medicare AWV:  1. Risk factors based on Past M, S, F history: reviewed  2. Physical Activities: active, some yard work, no routine exercise  3. Depression/mood: Neg screening  4. Hearing: No problems noted or reported  5. ADL's: Independent  6. Fall Risk: low risk , prevention discussed  7. home Safety: does feel safe at home  8. Height, weight, &visual acuity: see VS, vision well corrected w/ glasses, last visit w/ eye MD 2 years ago, rec to schedule a visit 9. Counseling: provided  10. Labs ordered based on risk factors: if needed  11. Referral Coordination: if needed  12. Care Plan, see assessment and plan  13. Cognitive Assessment: Motor skills and cognition seems normal   In addition, today we discussed the following: Hypertension, good medication compliance, not ambulatory BPs High cholesterol, good medication compliance. Insomnia, use to be well-controlled w/ doxepin, now w/ shoulder pain he is requiring melatonin 3 or 4 tablets at night. Severe R shoulder pain for 2 months, sudden onset, no injury,   pain wakes him up at night and encompasses the whole shoulder, unable to raise his right arm.No paresthesias but some decrease in the strength of the 3-4-5th fingers. Has been taking extra strength Tylenol 2 tablets every 3 hours (!)  Past Medical History  Diagnosis Date  . Melanoma 2010    face  . Diverticulosis     colon  . Hyperlipidemia   . Hypertension   . Insomnia   . Prostate ca 10/08    s/p robotic surgery  . CAD (coronary artery disease)     CP, cath, non-obstructive   Past Surgical History  Procedure Laterality Date  . Appendectomy    . Hernia repair    . Knee arthroscopy Left     meniscal tear  . Prostatectomy  07-2007     robotic,  for prostate cancer      Family History:  mother passed away at age 28 -- stroke  brain aneurysm (?) --F  CAD  - F, no MI ;late onset  HTN - ?  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 No dysuria gross hematuria. No neck pain per se, no bladder or bowel incontinence, no difficulty with gait.      Objective:   Physical Exam  BP 128/80  Pulse 75  Temp(Src) 98 F (36.7 C) (Oral)  Ht 5\' 9"  (1.753 m)  Wt 190 lb 9.6 oz (86.456 kg)  BMI 28.13 kg/m2  SpO2 97% General -- alert, well-developed, NAD .   Neck --no thyromegaly , normal carotid pulse Lungs -- normal respiratory effort, no intercostal retractions, no accessory muscle use, and normal breath sounds.   Heart-- normal rate, regular rhythm, no murmur, and no gallop.   Abdomen--soft, non-tender, no distention, no masses  Extremities-- no pretibial edema bilaterally Left shoulder normal. Right shoulder: No deformity, range of motion quite  decreased, + pain with passive and active movement Neurologic-- alert & oriented X3 and strength normal in all extremities. Psych-- Cognition and judgment appear intact.  Alert and cooperative with normal attention span and concentration.  not anxious appearing and not depressed appearing.       Assessment & Plan:

## 2013-05-28 NOTE — Assessment & Plan Note (Signed)
No change 

## 2013-05-29 ENCOUNTER — Encounter: Payer: Self-pay | Admitting: Internal Medicine

## 2013-05-29 LAB — COMPREHENSIVE METABOLIC PANEL
BUN: 14 mg/dL (ref 6–23)
CO2: 27 mEq/L (ref 19–32)
Creatinine, Ser: 0.7 mg/dL (ref 0.4–1.5)
GFR: 122.69 mL/min (ref >60.00)
Glucose, Bld: 83 mg/dL (ref 70–99)
Total Bilirubin: 0.7 mg/dL (ref 0.3–1.2)
Total Protein: 7 g/dL (ref 6.0–8.3)

## 2013-05-31 ENCOUNTER — Telehealth: Payer: Self-pay | Admitting: *Deleted

## 2013-05-31 DIAGNOSIS — M19019 Primary osteoarthritis, unspecified shoulder: Secondary | ICD-10-CM | POA: Diagnosis not present

## 2013-05-31 NOTE — Telephone Encounter (Signed)
Advised pt of lab results. Ask patients if there was any questions or concerns that I may answer. Pt did not have have any.   SW, CMA

## 2013-05-31 NOTE — Telephone Encounter (Signed)
Pt called and would like to about his lab results   SW cma

## 2013-06-07 DIAGNOSIS — Z85820 Personal history of malignant melanoma of skin: Secondary | ICD-10-CM | POA: Diagnosis not present

## 2013-06-07 DIAGNOSIS — L819 Disorder of pigmentation, unspecified: Secondary | ICD-10-CM | POA: Diagnosis not present

## 2013-06-07 DIAGNOSIS — Z85828 Personal history of other malignant neoplasm of skin: Secondary | ICD-10-CM | POA: Diagnosis not present

## 2013-06-07 DIAGNOSIS — D239 Other benign neoplasm of skin, unspecified: Secondary | ICD-10-CM | POA: Diagnosis not present

## 2013-06-21 DIAGNOSIS — M19019 Primary osteoarthritis, unspecified shoulder: Secondary | ICD-10-CM | POA: Diagnosis not present

## 2013-07-03 DIAGNOSIS — M19019 Primary osteoarthritis, unspecified shoulder: Secondary | ICD-10-CM | POA: Diagnosis not present

## 2013-07-05 DIAGNOSIS — M67919 Unspecified disorder of synovium and tendon, unspecified shoulder: Secondary | ICD-10-CM | POA: Diagnosis not present

## 2013-07-05 DIAGNOSIS — M19019 Primary osteoarthritis, unspecified shoulder: Secondary | ICD-10-CM | POA: Diagnosis not present

## 2013-07-09 DIAGNOSIS — G8918 Other acute postprocedural pain: Secondary | ICD-10-CM | POA: Diagnosis not present

## 2013-07-09 DIAGNOSIS — M75100 Unspecified rotator cuff tear or rupture of unspecified shoulder, not specified as traumatic: Secondary | ICD-10-CM | POA: Diagnosis not present

## 2013-07-09 DIAGNOSIS — M7500 Adhesive capsulitis of unspecified shoulder: Secondary | ICD-10-CM | POA: Diagnosis not present

## 2013-07-09 DIAGNOSIS — M19019 Primary osteoarthritis, unspecified shoulder: Secondary | ICD-10-CM | POA: Diagnosis not present

## 2013-07-09 DIAGNOSIS — M659 Synovitis and tenosynovitis, unspecified: Secondary | ICD-10-CM | POA: Diagnosis not present

## 2013-07-09 HISTORY — PX: SHOULDER ARTHROSCOPY: SHX128

## 2013-07-19 DIAGNOSIS — M19019 Primary osteoarthritis, unspecified shoulder: Secondary | ICD-10-CM | POA: Diagnosis not present

## 2013-07-22 DIAGNOSIS — M7500 Adhesive capsulitis of unspecified shoulder: Secondary | ICD-10-CM | POA: Diagnosis not present

## 2013-07-22 DIAGNOSIS — M19019 Primary osteoarthritis, unspecified shoulder: Secondary | ICD-10-CM | POA: Diagnosis not present

## 2013-07-24 DIAGNOSIS — M7500 Adhesive capsulitis of unspecified shoulder: Secondary | ICD-10-CM | POA: Diagnosis not present

## 2013-07-24 DIAGNOSIS — M19019 Primary osteoarthritis, unspecified shoulder: Secondary | ICD-10-CM | POA: Diagnosis not present

## 2013-07-30 DIAGNOSIS — M7500 Adhesive capsulitis of unspecified shoulder: Secondary | ICD-10-CM | POA: Diagnosis not present

## 2013-08-02 DIAGNOSIS — M7500 Adhesive capsulitis of unspecified shoulder: Secondary | ICD-10-CM | POA: Diagnosis not present

## 2013-08-06 DIAGNOSIS — M7500 Adhesive capsulitis of unspecified shoulder: Secondary | ICD-10-CM | POA: Diagnosis not present

## 2013-08-06 DIAGNOSIS — M19019 Primary osteoarthritis, unspecified shoulder: Secondary | ICD-10-CM | POA: Diagnosis not present

## 2013-08-08 DIAGNOSIS — M19019 Primary osteoarthritis, unspecified shoulder: Secondary | ICD-10-CM | POA: Diagnosis not present

## 2013-08-08 DIAGNOSIS — M7500 Adhesive capsulitis of unspecified shoulder: Secondary | ICD-10-CM | POA: Diagnosis not present

## 2013-08-13 DIAGNOSIS — M7500 Adhesive capsulitis of unspecified shoulder: Secondary | ICD-10-CM | POA: Diagnosis not present

## 2013-08-13 DIAGNOSIS — M19019 Primary osteoarthritis, unspecified shoulder: Secondary | ICD-10-CM | POA: Diagnosis not present

## 2013-08-16 DIAGNOSIS — M7500 Adhesive capsulitis of unspecified shoulder: Secondary | ICD-10-CM | POA: Diagnosis not present

## 2013-08-16 DIAGNOSIS — M19019 Primary osteoarthritis, unspecified shoulder: Secondary | ICD-10-CM | POA: Diagnosis not present

## 2013-08-20 DIAGNOSIS — M7500 Adhesive capsulitis of unspecified shoulder: Secondary | ICD-10-CM | POA: Diagnosis not present

## 2013-08-20 DIAGNOSIS — M19019 Primary osteoarthritis, unspecified shoulder: Secondary | ICD-10-CM | POA: Diagnosis not present

## 2013-08-23 ENCOUNTER — Other Ambulatory Visit: Payer: Self-pay | Admitting: Internal Medicine

## 2013-08-23 DIAGNOSIS — M19019 Primary osteoarthritis, unspecified shoulder: Secondary | ICD-10-CM | POA: Diagnosis not present

## 2013-08-23 DIAGNOSIS — M7500 Adhesive capsulitis of unspecified shoulder: Secondary | ICD-10-CM | POA: Diagnosis not present

## 2013-08-26 NOTE — Telephone Encounter (Signed)
rx refilled per protocol. DJR  

## 2013-08-27 DIAGNOSIS — M7500 Adhesive capsulitis of unspecified shoulder: Secondary | ICD-10-CM | POA: Diagnosis not present

## 2013-08-29 DIAGNOSIS — Z23 Encounter for immunization: Secondary | ICD-10-CM | POA: Diagnosis not present

## 2013-08-30 DIAGNOSIS — M7500 Adhesive capsulitis of unspecified shoulder: Secondary | ICD-10-CM | POA: Diagnosis not present

## 2013-09-02 DIAGNOSIS — M7500 Adhesive capsulitis of unspecified shoulder: Secondary | ICD-10-CM | POA: Diagnosis not present

## 2013-09-05 DIAGNOSIS — M7500 Adhesive capsulitis of unspecified shoulder: Secondary | ICD-10-CM | POA: Diagnosis not present

## 2013-09-10 DIAGNOSIS — M7500 Adhesive capsulitis of unspecified shoulder: Secondary | ICD-10-CM | POA: Diagnosis not present

## 2013-09-13 DIAGNOSIS — M7500 Adhesive capsulitis of unspecified shoulder: Secondary | ICD-10-CM | POA: Diagnosis not present

## 2013-09-16 DIAGNOSIS — M7500 Adhesive capsulitis of unspecified shoulder: Secondary | ICD-10-CM | POA: Diagnosis not present

## 2013-09-27 ENCOUNTER — Ambulatory Visit (INDEPENDENT_AMBULATORY_CARE_PROVIDER_SITE_OTHER): Payer: Medicare Other | Admitting: Internal Medicine

## 2013-09-27 ENCOUNTER — Encounter: Payer: Self-pay | Admitting: Internal Medicine

## 2013-09-27 VITALS — BP 111/73 | HR 66 | Temp 98.3°F | Wt 193.0 lb

## 2013-09-27 DIAGNOSIS — R739 Hyperglycemia, unspecified: Secondary | ICD-10-CM

## 2013-09-27 DIAGNOSIS — I10 Essential (primary) hypertension: Secondary | ICD-10-CM | POA: Diagnosis not present

## 2013-09-27 DIAGNOSIS — C61 Malignant neoplasm of prostate: Secondary | ICD-10-CM

## 2013-09-27 DIAGNOSIS — Z23 Encounter for immunization: Secondary | ICD-10-CM

## 2013-09-27 DIAGNOSIS — R7309 Other abnormal glucose: Secondary | ICD-10-CM | POA: Diagnosis not present

## 2013-09-27 DIAGNOSIS — E785 Hyperlipidemia, unspecified: Secondary | ICD-10-CM | POA: Diagnosis not present

## 2013-09-27 DIAGNOSIS — M1990 Unspecified osteoarthritis, unspecified site: Secondary | ICD-10-CM

## 2013-09-27 LAB — HEMOGLOBIN A1C: Hgb A1c MFr Bld: 5.7 % (ref 4.6–6.5)

## 2013-09-27 NOTE — Progress Notes (Signed)
   Subjective:    Patient ID: Larry Elliott, male    DOB: 1946/02/10, 67 y.o.   MRN: [id number]  HPI Routine followup, All recent labs reviewed, med list reviewed  Shoulder pain--status post surgery, doing better. Hypertension, good medication compliance, not ambulatory BPs High cholesterol, niacin at night causing hot flashes, taking only one tablet in the morning. Insomnia, still requires OTCs as needed   Past Medical History  Diagnosis Date  . Melanoma 2010    face  . Diverticulosis     colon  . Hyperlipidemia   . Hypertension   . Insomnia   . Prostate ca 10/08    s/p robotic surgery  . CAD (coronary artery disease)     CP, cath, non-obstructive   Past Surgical History  Procedure Laterality Date  . Appendectomy    . Hernia repair    . Knee arthroscopy Left     meniscal tear  . Prostatectomy  07-2007     robotic,  for prostate cancer    . Shoulder arthroscopy  07-09-2013    Dr Ave Filter    History   Social History  . Marital Status: Married    Spouse Name: N/A    Number of Children: 2  . Years of Education: N/A   Occupational History  . 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 Smoking status: Never Smoker   . Smokeless tobacco: Never Used  . Alcohol Use: Yes     Comment: socially   . Drug Use: Not on file  . Sexual Activity: Not on file   Other Topics Concern  . Not on file      Review of Systems In general feeling well. Denies nausea, vomiting, diarrhea. No chest pain or difficulty breathing.     Objective:   Physical Exam BP 111/73  Pulse 66  Temp(Src) 98.3 F (36.8 C)  Wt 193 lb (87.544 kg)  SpO2 97% General -- alert, well-developed, NAD.  Lungs -- normal respiratory effort, no intercostal retractions, no accessory muscle use, and normal breath sounds.  Heart-- normal rate, regular rhythm, no murmur.  Extremities-- no pretibial edema bilaterally  Neurologic--  alert & oriented X3.   Psych-- Cognition and judgment appear intact. Cooperative with normal attention span and concentration. No anxious appearing , no depressed appearing.     Assessment & Plan:

## 2013-09-27 NOTE — Progress Notes (Signed)
Pre visit review using our clinic review tool, if applicable. No additional management support is needed unless otherwise documented below in the visit note. 

## 2013-09-27 NOTE — Assessment & Plan Note (Signed)
Has not seen urology yet, encouraged to see Dr. Laverle Patter

## 2013-09-27 NOTE — Assessment & Plan Note (Signed)
Status post right shoulder surgery, doing better.

## 2013-09-27 NOTE — Assessment & Plan Note (Signed)
Well-controlled, No change, recent BMP normal

## 2013-09-27 NOTE — Assessment & Plan Note (Signed)
Pt is avoiding nighttime niacin d/t hot flashes, recommend to take the 2 niacin tablets in the morning after breakfast.

## 2013-09-27 NOTE — Patient Instructions (Signed)
Get your blood work before you leave  Next visit for a physical exam by 05-2014 fasting , sooner if problems or if BP is elevated  Please make an appointment    Check the  blood pressure  monthly be sure it is between 110/60 and 140/85. Ideal blood pressure is 120/80. If it is consistently higher or lower, let me know

## 2013-09-27 NOTE — Assessment & Plan Note (Signed)
Labs

## 2013-09-28 ENCOUNTER — Encounter: Payer: Self-pay | Admitting: Internal Medicine

## 2013-10-31 DIAGNOSIS — M25569 Pain in unspecified knee: Secondary | ICD-10-CM | POA: Diagnosis not present

## 2013-11-04 ENCOUNTER — Ambulatory Visit (INDEPENDENT_AMBULATORY_CARE_PROVIDER_SITE_OTHER): Payer: Medicare Other | Admitting: Internal Medicine

## 2013-11-04 ENCOUNTER — Encounter: Payer: Self-pay | Admitting: Internal Medicine

## 2013-11-04 VITALS — BP 123/77 | HR 77 | Temp 98.0°F | Wt 197.0 lb

## 2013-11-04 DIAGNOSIS — F419 Anxiety disorder, unspecified: Secondary | ICD-10-CM

## 2013-11-04 DIAGNOSIS — F329 Major depressive disorder, single episode, unspecified: Secondary | ICD-10-CM | POA: Insufficient documentation

## 2013-11-04 DIAGNOSIS — F32A Depression, unspecified: Secondary | ICD-10-CM | POA: Insufficient documentation

## 2013-11-04 DIAGNOSIS — F341 Dysthymic disorder: Secondary | ICD-10-CM

## 2013-11-04 HISTORY — DX: Depression, unspecified: F32.A

## 2013-11-04 HISTORY — DX: Anxiety disorder, unspecified: F41.9

## 2013-11-04 MED ORDER — ESCITALOPRAM OXALATE 10 MG PO TABS: 10.0000 mg | ORAL_TABLET | Freq: Every day | ORAL | Status: DC

## 2013-11-04 NOTE — Assessment & Plan Note (Signed)
Anxiety, depression. New onset of anxiety and depression in a 68 year old gentleman who is retired. There is no obvious triggers, he denies suicidal ideas. Symptoms could be related to recent holidays. PHQ 9----> scored 12 which is moderate depression. We talk about seeing a counselor, " there is really nothing they need to counsel me about". Then we talked observation and reassessment in 4 or 5 weeks or start a medication such as SSRI.  He feels like he would like to start something, I elected Lexapro as a good choice, to be taken at night. Side effects including suicidality. Counseled provided  F/u 5 weeks

## 2013-11-04 NOTE — Progress Notes (Signed)
   Subjective:    Patient ID: Larry Elliott, male    DOB: Sep 28, 1946, 68 y.o.   MRN: [id number]  HPI Here fto discuss the following issues: Early in December he started to feel anxious and depressed, no history of previous depression, history of insomnia taking doxepin. When asked about potential triggers there is actually none, he is retired since 2011, he enjoys his retirement, family  life is good and has no financial issues.  Past Medical History  Diagnosis Date  . Melanoma 2010    face  . Diverticulosis     colon  . Hyperlipidemia   . Hypertension   . Insomnia   . Prostate ca 10/08    s/p robotic surgery  . CAD (coronary artery disease)     CP, cath, non-obstructive   Past Surgical History  Procedure Laterality Date  . Appendectomy    . Hernia repair    . Knee arthroscopy Left     meniscal tear  . Prostatectomy  07-2007     robotic,  for prostate cancer    . Shoulder arthroscopy  07-09-2013    Dr Tamera Punt    History   Social History  . Marital Status: Married    Spouse Name: N/A    Number of Children: 2  . Years of Education: N/A   Occupational History  . 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 Smoking status: Never Smoker   . Smokeless tobacco: Never Used  . Alcohol Use: Yes     Comment: socially   . Drug Use: Not on file  . Sexual Activity: Not on file   Other Topics Concern  . Not on file   Social History Narrative  . No narrative on file     Review of Systems Denies any suicidal ideas. No headaches, nausea, vomiting. No weight loss.     Objective:   Physical Exam BP 123/77  Pulse 77  Temp(Src) 98 F (36.7 C)  Wt 197 lb (89.359 kg)  SpO2 95% General -- alert, well-developed, NAD.   Neurologic--  alert & oriented X3. Speech normal, gait normal, strength normal in all extremities.  Psych-- Cognition and judgment appear intact. Cooperative with normal attention span and  concentration. No anxious or depressed appearing.      Assessment & Plan:   Today , I spent more than 25  min with the patient, >50% of the time counseling

## 2013-11-04 NOTE — Patient Instructions (Signed)
Next visit is for routine check up regards your mood   in 5 weeks  No  fasting Please make an appointment

## 2013-11-04 NOTE — Progress Notes (Signed)
Pre visit review using our clinic review tool, if applicable. No additional management support is needed unless otherwise documented below in the visit note. 

## 2013-11-21 ENCOUNTER — Other Ambulatory Visit: Payer: Self-pay | Admitting: Internal Medicine

## 2013-12-09 ENCOUNTER — Ambulatory Visit (INDEPENDENT_AMBULATORY_CARE_PROVIDER_SITE_OTHER): Payer: Medicare Other | Admitting: Internal Medicine

## 2013-12-09 ENCOUNTER — Encounter: Payer: Self-pay | Admitting: Internal Medicine

## 2013-12-09 VITALS — BP 130/84 | HR 95 | Temp 98.4°F | Wt 195.0 lb

## 2013-12-09 DIAGNOSIS — F32A Depression, unspecified: Secondary | ICD-10-CM

## 2013-12-09 DIAGNOSIS — F419 Anxiety disorder, unspecified: Principal | ICD-10-CM

## 2013-12-09 DIAGNOSIS — F329 Major depressive disorder, single episode, unspecified: Secondary | ICD-10-CM

## 2013-12-09 DIAGNOSIS — F341 Dysthymic disorder: Secondary | ICD-10-CM | POA: Diagnosis not present

## 2013-12-09 MED ORDER — ESCITALOPRAM OXALATE 10 MG PO TABS: 10.0000 mg | ORAL_TABLET | Freq: Every day | ORAL | Status: DC

## 2013-12-09 NOTE — Assessment & Plan Note (Addendum)
Since the last time he was here, started Lexapro, feeling better. We agreed to stay in the same medication for now, he will let me know if he likes to do something different I did encourage him to be more physically  active as a way to help  his symptoms. Follow up in 6 months. RF meds

## 2013-12-09 NOTE — Progress Notes (Signed)
Pre visit review using our clinic review tool, if applicable. No additional management support is needed unless otherwise documented below in the visit note. 

## 2013-12-09 NOTE — Progress Notes (Signed)
   Subjective:    Patient ID: Larry Elliott, male    DOB: 02/15/1946, 68 y.o.   MRN: [id number]  DOS:  12/09/2013 Followup from previous visit  Patient started Lexapro   the last time he was here, good compliance, no apparent side effects. Feeling better, "closer to my normal self "  Past Medical History  Diagnosis Date  . Melanoma 2010    face  . Diverticulosis     colon  . Hyperlipidemia   . Hypertension   . Insomnia   . Prostate ca 10/08    s/p robotic surgery  . CAD (coronary artery disease)     CP, cath, non-obstructive  . Anxiety and depression 11/04/2013    Past Surgical History  Procedure Laterality Date  . Appendectomy    . Hernia repair    . Knee arthroscopy Left     meniscal tear  . Prostatectomy  07-2007     robotic,  for prostate cancer    . Shoulder arthroscopy  07-09-2013    Dr Tamera Punt     History   Social History  . Marital Status: Married    Spouse Name: N/A    Number of Children: 2  . Years of Education: N/A   Occupational History  . 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 Smoking status: Never Smoker   . Smokeless tobacco: Never Used  . Alcohol Use: Yes     Comment: socially   . Drug Use: Not on file  . Sexual Activity: Not on file   Other Topics Concern  . Not on file   Social History Narrative  . No narrative on file    ROS  Denies suicidal ideas No  nausea, vomiting, diarrhea.  Goes to work once a week, he is very active at work otherwise no exercise      Objective:   Physical Exam BP 130/84  Pulse 95  Temp(Src) 98.4 F (36.9 C)  Wt 195 lb (88.451 kg)  SpO2 96%  General -- alert, well-developed, NAD.   Neurologic--  alert & oriented X3. Speech normal, gait normal, strength normal in all extremities.   Psych-- Cognition and judgment appear intact. Cooperative with normal attention span and concentration. No anxious or depressed appearing.       Assessment &  Plan:

## 2013-12-09 NOTE — Patient Instructions (Addendum)
Next visit for a physical exam by 05-2014 fasting , sooner if problems or if BP is elevated   Please make an appointment

## 2013-12-10 ENCOUNTER — Encounter: Payer: Self-pay | Admitting: Internal Medicine

## 2014-01-23 DIAGNOSIS — M25559 Pain in unspecified hip: Secondary | ICD-10-CM | POA: Diagnosis not present

## 2014-01-23 DIAGNOSIS — M1710 Unilateral primary osteoarthritis, unspecified knee: Secondary | ICD-10-CM | POA: Diagnosis not present

## 2014-01-27 ENCOUNTER — Other Ambulatory Visit: Payer: Self-pay | Admitting: Internal Medicine

## 2014-02-03 DIAGNOSIS — M25559 Pain in unspecified hip: Secondary | ICD-10-CM | POA: Diagnosis not present

## 2014-02-06 DIAGNOSIS — M25559 Pain in unspecified hip: Secondary | ICD-10-CM | POA: Diagnosis not present

## 2014-02-07 DIAGNOSIS — M1610 Unilateral primary osteoarthritis, unspecified hip: Secondary | ICD-10-CM | POA: Diagnosis not present

## 2014-02-10 DIAGNOSIS — M25559 Pain in unspecified hip: Secondary | ICD-10-CM | POA: Diagnosis not present

## 2014-02-11 DIAGNOSIS — M25559 Pain in unspecified hip: Secondary | ICD-10-CM | POA: Diagnosis not present

## 2014-02-11 DIAGNOSIS — M1710 Unilateral primary osteoarthritis, unspecified knee: Secondary | ICD-10-CM | POA: Diagnosis not present

## 2014-02-11 DIAGNOSIS — M25569 Pain in unspecified knee: Secondary | ICD-10-CM | POA: Diagnosis not present

## 2014-02-13 DIAGNOSIS — M25559 Pain in unspecified hip: Secondary | ICD-10-CM | POA: Diagnosis not present

## 2014-02-17 DIAGNOSIS — M25559 Pain in unspecified hip: Secondary | ICD-10-CM | POA: Diagnosis not present

## 2014-02-20 DIAGNOSIS — M25559 Pain in unspecified hip: Secondary | ICD-10-CM | POA: Diagnosis not present

## 2014-02-27 ENCOUNTER — Other Ambulatory Visit: Payer: Self-pay | Admitting: Internal Medicine

## 2014-02-27 DIAGNOSIS — M25559 Pain in unspecified hip: Secondary | ICD-10-CM | POA: Diagnosis not present

## 2014-03-03 DIAGNOSIS — M25559 Pain in unspecified hip: Secondary | ICD-10-CM | POA: Diagnosis not present

## 2014-03-06 ENCOUNTER — Telehealth: Payer: Self-pay | Admitting: Internal Medicine

## 2014-03-06 DIAGNOSIS — M25559 Pain in unspecified hip: Secondary | ICD-10-CM | POA: Diagnosis not present

## 2014-03-06 MED ORDER — ENALAPRIL MALEATE 5 MG PO TABS: ORAL_TABLET | ORAL | Status: DC

## 2014-03-06 MED ORDER — LOVASTATIN 20 MG PO TABS: ORAL_TABLET | ORAL | Status: DC

## 2014-03-06 MED ORDER — HYDROCHLOROTHIAZIDE 25 MG PO TABS: ORAL_TABLET | ORAL | Status: DC

## 2014-03-06 NOTE — Telephone Encounter (Signed)
Caller name: Wililam Relation to pt: Call back number:[phone] Pharmacy: [facility]  Reason for call:   [facility] and these RX's were not in there system. Can we get them resubmitted.    enalapril (VASOTEC) 5 MG tablet 90 tablet  hydrochlorothiazide (HYDRODIURIL) 25 MG tablet 90 tablet  lovastatin (MEVACOR) 20 MG tablet 90 tablet

## 2014-03-06 NOTE — Telephone Encounter (Signed)
Med's sent.      KP 

## 2014-03-06 NOTE — Telephone Encounter (Signed)
Rx's sent to the pharmacy by e-script.//AB/CMA 

## 2014-03-13 DIAGNOSIS — M25559 Pain in unspecified hip: Secondary | ICD-10-CM | POA: Diagnosis not present

## 2014-05-26 ENCOUNTER — Other Ambulatory Visit: Payer: Self-pay | Admitting: Internal Medicine

## 2014-05-29 ENCOUNTER — Ambulatory Visit (INDEPENDENT_AMBULATORY_CARE_PROVIDER_SITE_OTHER): Payer: Medicare Other | Admitting: Internal Medicine

## 2014-05-29 ENCOUNTER — Encounter: Payer: Self-pay | Admitting: Internal Medicine

## 2014-05-29 VITALS — BP 129/77 | HR 67 | Temp 98.1°F | Ht 71.0 in | Wt 202.0 lb

## 2014-05-29 DIAGNOSIS — C61 Malignant neoplasm of prostate: Secondary | ICD-10-CM

## 2014-05-29 DIAGNOSIS — F341 Dysthymic disorder: Secondary | ICD-10-CM

## 2014-05-29 DIAGNOSIS — C4330 Malignant melanoma of unspecified part of face: Secondary | ICD-10-CM | POA: Diagnosis not present

## 2014-05-29 DIAGNOSIS — Z23 Encounter for immunization: Secondary | ICD-10-CM

## 2014-05-29 DIAGNOSIS — F329 Major depressive disorder, single episode, unspecified: Secondary | ICD-10-CM

## 2014-05-29 DIAGNOSIS — E785 Hyperlipidemia, unspecified: Secondary | ICD-10-CM

## 2014-05-29 DIAGNOSIS — F32A Depression, unspecified: Secondary | ICD-10-CM

## 2014-05-29 DIAGNOSIS — I10 Essential (primary) hypertension: Secondary | ICD-10-CM | POA: Diagnosis not present

## 2014-05-29 DIAGNOSIS — F419 Anxiety disorder, unspecified: Secondary | ICD-10-CM

## 2014-05-29 DIAGNOSIS — Z0000 Encounter for general adult medical examination without abnormal findings: Secondary | ICD-10-CM

## 2014-05-29 LAB — CBC WITH DIFFERENTIAL/PLATELET
Basophils Absolute: 0 10*3/uL (ref 0.0–0.1)
Basophils Relative: 0.4 % (ref 0.0–3.0)
EOS PCT: 2.4 % (ref 0.0–5.0)
Eosinophils Absolute: 0.1 10*3/uL (ref 0.0–0.7)
HEMATOCRIT: 42 % (ref 39.0–52.0)
Hemoglobin: 14 g/dL (ref 13.0–17.0)
LYMPHS ABS: 1.1 10*3/uL (ref 0.7–4.0)
Lymphocytes Relative: 18.4 % (ref 12.0–46.0)
MCHC: 33.3 g/dL (ref 30.0–36.0)
MCV: 94 fl (ref 78.0–100.0)
MONOS PCT: 8.8 % (ref 3.0–12.0)
Monocytes Absolute: 0.5 10*3/uL (ref 0.1–1.0)
NEUTROS PCT: 70 % (ref 43.0–77.0)
Neutro Abs: 4.2 10*3/uL (ref 1.4–7.7)
PLATELETS: 340 10*3/uL (ref 150.0–400.0)
RBC: 4.47 Mil/uL (ref 4.22–5.81)
RDW: 14 % (ref 11.5–15.5)
WBC: 6 10*3/uL (ref 4.0–10.5)

## 2014-05-29 LAB — COMPREHENSIVE METABOLIC PANEL
ALBUMIN: 3.7 g/dL (ref 3.5–5.2)
ALK PHOS: 67 U/L (ref 39–117)
ALT: 32 U/L (ref 0–53)
AST: 26 U/L (ref 0–37)
BILIRUBIN TOTAL: 0.8 mg/dL (ref 0.2–1.2)
BUN: 13 mg/dL (ref 6–23)
CO2: 26 meq/L (ref 19–32)
Calcium: 9 mg/dL (ref 8.4–10.5)
Chloride: 101 mEq/L (ref 96–112)
Creatinine, Ser: 0.7 mg/dL (ref 0.4–1.5)
GFR: 122.34 mL/min (ref >60.00)
GLUCOSE: 85 mg/dL (ref 70–99)
Potassium: 3.4 mEq/L — ABNORMAL LOW (ref 3.5–5.1)
Sodium: 136 mEq/L (ref 135–145)
Total Protein: 6.8 g/dL (ref 6.0–8.3)

## 2014-05-29 LAB — LIPID PANEL
CHOL/HDL RATIO: 4
CHOLESTEROL: 181 mg/dL (ref 0–200)
HDL: 51.2 mg/dL (ref >39.00)
NonHDL: 129.8
TRIGLYCERIDES: 254 mg/dL — AB (ref 0.0–149.0)
VLDL: 50.8 mg/dL — AB (ref 0.0–40.0)

## 2014-05-29 LAB — LDL CHOLESTEROL, DIRECT: LDL DIRECT: 110.2 mg/dL

## 2014-05-29 NOTE — Assessment & Plan Note (Signed)
Will refer to Dr. Alinda Money

## 2014-05-29 NOTE — Assessment & Plan Note (Signed)
Seems well controlled, continue with present care, call for refills as needed, check a BMP,   check BPs at least once a month. Next visit one-year

## 2014-05-29 NOTE — Assessment & Plan Note (Signed)
Anxiety, depression, insomnia well controlled with current meds.We talk about discontinue SSRIs, patient feels well, stable and desires to continue with meds this time

## 2014-05-29 NOTE — Assessment & Plan Note (Addendum)
Td 2009 pneumonia shot 2009 prevnar-- zostavax 2011  counseled diet-exercise  Colonoscopy 2003 , and 09-2010(tubular adenoma, 5 years). Dr Oletta Lamas

## 2014-05-29 NOTE — Progress Notes (Signed)
Subjective:    Patient ID: Larry Elliott, male    DOB: 12-10-1945, 68 y.o.   MRN: [id number]  DOS:  05/29/2014 Type of visit - description:  Here for Medicare AWV:   1. Risk factors based on Past M, S, F history: reviewed   2. Physical Activities: active, some yard work, no routine exercise   3. Depression/mood: sx well controlled    4. Hearing: No problems noted or reported   5. ADL's: Independent   6. Fall Risk: no recent falls, low risk , prevention discussed   7. home Safety: does feel safe at home   8. Height, weight, &visual acuity: see VS, vision well corrected w/ glasses, last visit w/ eye MD ~ 6 months ago 9. Counseling: provided   10. Labs ordered based on risk factors: if needed   11. Referral Coordination: if needed   12. Care Plan, see assessment and plan   13. Cognitive Assessment: Motor skills and cognition seems normal   In addition, today we discussed the following: Hypertension, good compliance, no apparent side effects High cholesterol, and good medication compliance, denies unusual aches or pains   ROS No  CP, SOB No palpitations, no lower extremity edema Denies  nausea, vomiting diarrhea, blood in the stools (-) cough, sputum production (-) wheezing, chest congestion No dysuria, gross hematuria, difficulty urinating   No headaches.  Denies dizziness    Past Medical History  Diagnosis Date  . Melanoma 2010    face, sees derm   . Diverticulosis     colon  . Hyperlipidemia   . Hypertension   . Insomnia   . Prostate ca 10/08    s/p robotic surgery  . CAD (coronary artery disease)     CP, cath, non-obstructive  . Anxiety and depression 11/04/2013    Past Surgical History  Procedure Laterality Date  . Appendectomy    . Hernia repair    . Knee arthroscopy Left     meniscal tear  . Prostatectomy  07-2007     robotic,  for prostate cancer    . Shoulder arthroscopy  07-09-2013    Dr Tamera Punt     History   Social History  . Marital Status:  Married    Spouse Name: N/A    Number of Children: 2  . Years of Education: N/A   Occupational History  . 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 Smoking status: Never Smoker   . Smokeless tobacco: Never Used  . Alcohol Use: Yes     Comment: socially   . Drug Use: Not on file  . Sexual Activity: Not on file   Other Topics Concern  . Not on file   Social History Narrative   Lives w/ wife      Family History  Problem Relation Age of Onset  . Coronary artery disease Father   . Hypertension Neg Hx   . Diabetes Neg Hx   . Stroke Mother   . Colon cancer Neg Hx   . Prostate cancer Neg Hx        Medication List       This list is accurate as of: 05/29/14  5:22 PM.  Always use your most recent med list.               doxepin 50 MG capsule  Commonly known as:  SINEQUAN  TAKE 1 CAPSULE BY MOUTH DAILY.  enalapril 5 MG tablet  Commonly known as:  VASOTEC  TAKE 1 TABLET BY MOUTH ONCE A DAY     escitalopram 10 MG tablet  Commonly known as:  LEXAPRO  Take 1 tablet (10 mg total) by mouth daily.     FIBER LAXATIVE PO  Take by mouth.     fish oil-omega-3 fatty acids 1000 MG capsule  Take 2 g by mouth daily.     hydrochlorothiazide 25 MG tablet  Commonly known as:  HYDRODIURIL  TAKE 1 TABLET BY MOUTH ONCE A DAY     lovastatin 20 MG tablet  Commonly known as:  MEVACOR  TAKE 1 TABLET BY MOUTH DAILY AT BEDTIME     multivitamin per tablet  Take 1 tablet by mouth daily.     niacin 500 MG tablet  Take 1,000 mg by mouth daily.     vitamin E 100 UNIT capsule  Take 100 Units by mouth daily.           Objective:   Physical Exam BP 129/77  Pulse 67  Temp(Src) 98.1 F (36.7 C) (Oral)  Ht 5\' 11"  (1.803 m)  Wt 202 lb (91.627 kg)  BMI 28.19 kg/m2  SpO2 95% General -- alert, well-developed, NAD.  Neck --no thyromegaly  HEENT-- Not pale.  Lungs -- normal respiratory effort, no intercostal retractions,  no accessory muscle use, and normal breath sounds.  Heart-- normal rate, regular rhythm, no murmur.  Abdomen-- Not distended, good bowel sounds,soft, non-tender. No bruit  Extremities-- no pretibial edema bilaterally  Neurologic--  alert & oriented X3. Speech normal, gait appropriate for age, strength symmetric and appropriate for age.  Psych-- Cognition and judgment appear intact. Cooperative with normal attention span and concentration. No anxious or depressed appearing.     Assessment & Plan:

## 2014-05-29 NOTE — Assessment & Plan Note (Signed)
Has  not seeing dermatology in a while, recommend to schedule a visit to see them

## 2014-05-29 NOTE — Assessment & Plan Note (Signed)
Good compliance, check labs  

## 2014-05-29 NOTE — Patient Instructions (Signed)
Get your blood work before you leave   Check the  blood pressure monthly be sure it is between 110/60 and 140/85. Ideal blood pressure is 120/80. If it is consistently higher or lower, let me know  Next visit is for a physical exam in 1 year,  fasting Please make an appointment       Fall Prevention and Home Safety Falls cause injuries and can affect all age groups. It is possible to use preventive measures to significantly decrease the likelihood of falls. There are many simple measures which can make your home safer and prevent falls. OUTDOORS  Repair cracks and edges of walkways and driveways.  Remove high doorway thresholds.  Trim shrubbery on the main path into your home.  Have good outside lighting.  Clear walkways of tools, rocks, debris, and clutter.  Check that handrails are not broken and are securely fastened. Both sides of steps should have handrails.  Have leaves, snow, and ice cleared regularly.  Use sand or salt on walkways during winter months.  In the garage, clean up grease or oil spills. BATHROOM  Install night lights.  Install grab bars by the toilet and in the tub and shower.  Use non-skid mats or decals in the tub or shower.  Place a plastic non-slip stool in the shower to sit on, if needed.  Keep floors dry and clean up all water on the floor immediately.  Remove soap buildup in the tub or shower on a regular basis.  Secure bath mats with non-slip, double-sided rug tape.  Remove throw rugs and tripping hazards from the floors. BEDROOMS  Install night lights.  Make sure a bedside light is easy to reach.  Do not use oversized bedding.  Keep a telephone by your bedside.  Have a firm chair with side arms to use for getting dressed.  Remove throw rugs and tripping hazards from the floor. KITCHEN  Keep handles on pots and pans turned toward the center of the stove. Use back burners when possible.  Clean up spills quickly and allow time  for drying.  Avoid walking on wet floors.  Avoid hot utensils and knives.  Position shelves so they are not too high or low.  Place commonly used objects within easy reach.  If necessary, use a sturdy step stool with a grab bar when reaching.  Keep electrical cables out of the way.  Do not use floor polish or wax that makes floors slippery. If you must use wax, use non-skid floor wax.  Remove throw rugs and tripping hazards from the floor. STAIRWAYS  Never leave objects on stairs.  Place handrails on both sides of stairways and use them. Fix any loose handrails. Make sure handrails on both sides of the stairways are as long as the stairs.  Check carpeting to make sure it is firmly attached along stairs. Make repairs to worn or loose carpet promptly.  Avoid placing throw rugs at the top or bottom of stairways, or properly secure the rug with carpet tape to prevent slippage. Get rid of throw rugs, if possible.  Have an electrician put in a light switch at the top and bottom of the stairs. OTHER FALL PREVENTION TIPS  Wear low-heel or rubber-soled shoes that are supportive and fit well. Wear closed toe shoes.  When using a stepladder, make sure it is fully opened and both spreaders are firmly locked. Do not climb a closed stepladder.  Add color or contrast paint or tape to grab bars  and handrails in your home. Place contrasting color strips on first and last steps.  Learn and use mobility aids as needed. Install an electrical emergency response system.  Turn on lights to avoid dark areas. Replace light bulbs that burn out immediately. Get light switches that glow.  Arrange furniture to create clear pathways. Keep furniture in the same place.  Firmly attach carpet with non-skid or double-sided tape.  Eliminate uneven floor surfaces.  Select a carpet pattern that does not visually hide the edge of steps.  Be aware of all pets. OTHER HOME SAFETY TIPS  Set the water  temperature for 120 F (48.8 C).  Keep emergency numbers on or near the telephone.  Keep smoke detectors on every level of the home and near sleeping areas. Document Released: 09/30/2002 Document Revised: 04/10/2012 Document Reviewed: 12/30/2011 [facility] Patient Information 2015 [place], [place]. This information is not intended to replace advice given to you by your health care provider. Make sure you discuss any questions you have with your health care provider.

## 2014-05-30 ENCOUNTER — Telehealth: Payer: Self-pay | Admitting: Internal Medicine

## 2014-05-30 NOTE — Telephone Encounter (Signed)
Relevant patient education assigned to patient using Emmi. ° °

## 2014-06-12 DIAGNOSIS — N529 Male erectile dysfunction, unspecified: Secondary | ICD-10-CM | POA: Diagnosis not present

## 2014-06-12 DIAGNOSIS — C61 Malignant neoplasm of prostate: Secondary | ICD-10-CM | POA: Diagnosis not present

## 2014-06-17 ENCOUNTER — Other Ambulatory Visit: Payer: Self-pay | Admitting: Internal Medicine

## 2014-07-07 DIAGNOSIS — D239 Other benign neoplasm of skin, unspecified: Secondary | ICD-10-CM | POA: Diagnosis not present

## 2014-07-07 DIAGNOSIS — L570 Actinic keratosis: Secondary | ICD-10-CM | POA: Diagnosis not present

## 2014-07-07 DIAGNOSIS — Z85820 Personal history of malignant melanoma of skin: Secondary | ICD-10-CM | POA: Diagnosis not present

## 2014-07-07 DIAGNOSIS — Z85828 Personal history of other malignant neoplasm of skin: Secondary | ICD-10-CM | POA: Diagnosis not present

## 2014-07-07 DIAGNOSIS — L819 Disorder of pigmentation, unspecified: Secondary | ICD-10-CM | POA: Diagnosis not present

## 2014-07-15 ENCOUNTER — Telehealth: Payer: Self-pay | Admitting: Internal Medicine

## 2014-07-15 NOTE — Telephone Encounter (Signed)
°  Pt wanted to advise you expercicing pain in stomach wanted to see you today, no availability, did not want to see a PA. Scheduled pt with Dr. Larose Kells 07/16/14.

## 2014-07-15 NOTE — Telephone Encounter (Signed)
Noted, thx.

## 2014-07-15 NOTE — Telephone Encounter (Signed)
C/o: intermittent, sharp, shooting pain, non-radiating in the left lower quadrant.  Site is tender to touch. Accompanying symptoms: abdominal cramping,  audible abdominal gurgling/growing, and lack of appetite. Onset: Sunday.  Last BM: this morning.  Diarrhea--watery stools--since Sunday.  Denies presence of blood or mucous in stool.  Afebrile.  No N/V.    Self-treating: fiber pill from [facility].  Stools are still loose. However, he kept getting up last night (approximately 10 times) because he felt like he had to go, but couldn't.  Hx. Diverticulosis  Advice: Appt with Dr. Larose Kells scheduled for tomorrow (07/16/14) at 2 pm.  If symptoms worsen or new symptoms develop, go to the ER.  Pt stated understanding and agreed with plan.

## 2014-07-16 ENCOUNTER — Encounter: Payer: Self-pay | Admitting: Internal Medicine

## 2014-07-16 ENCOUNTER — Ambulatory Visit (HOSPITAL_BASED_OUTPATIENT_CLINIC_OR_DEPARTMENT_OTHER)
Admission: RE | Admit: 2014-07-16 | Discharge: 2014-07-16 | Disposition: A | Discharge status: Home / Self Care | Payer: Medicare Other | Source: Ambulatory Visit | Attending: Internal Medicine | Admitting: Internal Medicine

## 2014-07-16 ENCOUNTER — Ambulatory Visit (INDEPENDENT_AMBULATORY_CARE_PROVIDER_SITE_OTHER): Payer: Medicare Other | Admitting: Internal Medicine

## 2014-07-16 VITALS — BP 124/62 | HR 84 | Temp 97.7°F | Wt 201.5 lb

## 2014-07-16 DIAGNOSIS — R1032 Left lower quadrant pain: Secondary | ICD-10-CM

## 2014-07-16 LAB — POCT URINALYSIS DIPSTICK
Bilirubin, UA: NEGATIVE
Glucose, UA: NEGATIVE
Ketones, UA: NEGATIVE
Leukocytes, UA: NEGATIVE
Nitrite, UA: NEGATIVE
PROTEIN UA: NEGATIVE
SPEC GRAV UA: 1.015
UROBILINOGEN UA: 0.2
pH, UA: 5

## 2014-07-16 MED ORDER — CIPROFLOXACIN HCL 500 MG PO TABS: 500.0000 mg | ORAL_TABLET | Freq: Two times a day (BID) | ORAL | Status: DC

## 2014-07-16 MED ORDER — METRONIDAZOLE 500 MG PO TABS: 500.0000 mg | ORAL_TABLET | Freq: Three times a day (TID) | ORAL | Status: DC

## 2014-07-16 NOTE — Progress Notes (Signed)
Pre visit review using our clinic review tool, if applicable. No additional management support is needed unless otherwise documented below in the visit note. 

## 2014-07-16 NOTE — Patient Instructions (Signed)
Get your blood work before you leave   Stop by the first floor and get the XR    Bland diet, drink plenty of fluids  Tylenol as needed  Take ciprofloxacin and Flagyl  Come back to the office in one week  Call anytime if you have fever, chills or if pain increases.  ER if symptoms severe.

## 2014-07-16 NOTE — Progress Notes (Signed)
Subjective:    Patient ID: Larry Elliott, male    DOB: 25-Jul-1946, 68 y.o.   MRN: [id number]  DOS:  07/16/2014 Type of visit - description : acute Interval history: Symptoms started 3 days ago with on and off, sharp left lower quadrant abdominal pain. On looking back, for the last week he has been moderately constipated and has been feeling bloated. Sometimes he feels like he needs to have a bowel movement but actually is able to pass gas only. He had a s OTC laxative yesterday and had a small amount of the stools.    ROS Denies fever or chills Appetite normal No nausea, vomiting, blood in the stools. No cough or difficulty breathing. No dysuria, gross nocturia, difficulty urinating. No mass at the inguinal areas  Past Medical History  Diagnosis Date  . Melanoma 2010    face, sees derm   . Diverticulosis     colon  . Hyperlipidemia   . Hypertension   . Insomnia   . Prostate ca 10/08    s/p robotic surgery  . CAD (coronary artery disease)     CP, cath, non-obstructive  . Anxiety and depression 11/04/2013    Past Surgical History  Procedure Laterality Date  . Appendectomy    . Hernia repair    . Knee arthroscopy Left     meniscal tear  . Prostatectomy  07-2007     robotic,  for prostate cancer    . Shoulder arthroscopy  07-09-2013    Dr Tamera Punt     History   Social History  . Marital Status: Married    Spouse Name: N/A    Number of Children: 2  . Years of Education: N/A   Occupational History  . 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 Smoking status: Never Smoker   . Smokeless tobacco: Never Used  . Alcohol Use: Yes     Comment: socially   . Drug Use: Not on file  . Sexual Activity: Not on file   Other Topics Concern  . Not on file   Social History Narrative   Lives w/ wife         Medication List       This list is accurate as of: 07/16/14 11:59 PM.  Always use your most recent  med list.               ciprofloxacin 500 MG tablet  Commonly known as:  CIPRO  Take 1 tablet (500 mg total) by mouth 2 (two) times daily.     doxepin 50 MG capsule  Commonly known as:  SINEQUAN  TAKE 1 CAPSULE BY MOUTH DAILY.     enalapril 5 MG tablet  Commonly known as:  VASOTEC  TAKE 1 TABLET BY MOUTH ONCE A DAY     escitalopram 10 MG tablet  Commonly known as:  LEXAPRO  TAKE 1 TABLET BY MOUTH DAILY.     FIBER LAXATIVE PO  Take by mouth.     fish oil-omega-3 fatty acids 1000 MG capsule  Take 2 g by mouth daily.     hydrochlorothiazide 25 MG tablet  Commonly known as:  HYDRODIURIL  TAKE 1 TABLET BY MOUTH ONCE A DAY     lovastatin 20 MG tablet  Commonly known as:  MEVACOR  TAKE 1 TABLET BY MOUTH DAILY AT BEDTIME     metroNIDAZOLE 500 MG tablet  Commonly known  as:  FLAGYL  Take 1 tablet (500 mg total) by mouth 3 (three) times daily.     multivitamin per tablet  Take 1 tablet by mouth daily.     niacin 500 MG tablet  Take 1,000 mg by mouth daily.     vitamin E 100 UNIT capsule  Take 100 Units by mouth daily.           Objective:   Physical Exam BP 124/62  Pulse 84  Temp(Src) 97.7 F (36.5 C) (Oral)  Wt 201 lb 8 oz (91.4 kg)  SpO2 95% General -- alert, well-developed, NAD.  HEENT-- Not pale. No jaundice Lungs -- normal respiratory effort, no intercostal retractions, no accessory muscle use, and normal breath sounds.  Heart-- normal rate, regular rhythm, no murmur.  Abdomen-- Not distended, good bowel sounds,soft, + tender, see graphic. No rebound or rigidity. No mass,organomegaly. GU-- normal scrotal contents, no inguinal hernia-mass Extremities-- no pretibial edema bilaterally  Neurologic--  alert & oriented X3. Speech normal, gait appropriate for age, strength symmetric and appropriate for age.  Psych-- Cognition and judgment appear intact. Cooperative with normal attention span and concentration. No anxious or depressed appearing.        Assessment & Plan:

## 2014-07-17 LAB — URINALYSIS, ROUTINE W REFLEX MICROSCOPIC
Bilirubin Urine: NEGATIVE
HGB URINE DIPSTICK: NEGATIVE
Ketones, ur: NEGATIVE
LEUKOCYTES UA: NEGATIVE
NITRITE: NEGATIVE
RBC / HPF: NONE SEEN (ref 0–?)
Specific Gravity, Urine: 1.015 (ref 1.000–1.030)
TOTAL PROTEIN, URINE-UPE24: NEGATIVE
UROBILINOGEN UA: 0.2 (ref 0.0–1.0)
Urine Glucose: NEGATIVE
WBC, UA: NONE SEEN (ref 0–?)
pH: 6 (ref 5.0–8.0)

## 2014-07-17 LAB — URINE CULTURE
Colony Count: NO GROWTH
ORGANISM ID, BACTERIA: NO GROWTH

## 2014-07-17 LAB — CBC WITH DIFFERENTIAL/PLATELET
Basophils Absolute: 0 10*3/uL (ref 0.0–0.1)
Basophils Relative: 0.2 % (ref 0.0–3.0)
Eosinophils Absolute: 0.2 10*3/uL (ref 0.0–0.7)
Eosinophils Relative: 2.4 % (ref 0.0–5.0)
HCT: 42.1 % (ref 39.0–52.0)
HEMOGLOBIN: 14.2 g/dL (ref 13.0–17.0)
Lymphocytes Relative: 19.6 % (ref 12.0–46.0)
Lymphs Abs: 1.3 10*3/uL (ref 0.7–4.0)
MCHC: 33.8 g/dL (ref 30.0–36.0)
MCV: 93.5 fl (ref 78.0–100.0)
MONO ABS: 0.3 10*3/uL (ref 0.1–1.0)
MONOS PCT: 5 % (ref 3.0–12.0)
NEUTROS ABS: 4.8 10*3/uL (ref 1.4–7.7)
Neutrophils Relative %: 72.8 % (ref 43.0–77.0)
Platelets: 316 10*3/uL (ref 150.0–400.0)
RBC: 4.5 Mil/uL (ref 4.22–5.81)
RDW: 14.1 % (ref 11.5–15.5)
WBC: 6.6 10*3/uL (ref 4.0–10.5)

## 2014-07-17 LAB — BASIC METABOLIC PANEL
BUN: 14 mg/dL (ref 6–23)
CALCIUM: 9.1 mg/dL (ref 8.4–10.5)
CO2: 28 meq/L (ref 19–32)
Chloride: 103 mEq/L (ref 96–112)
Creatinine, Ser: 0.8 mg/dL (ref 0.4–1.5)
GFR: 98.53 mL/min (ref >60.00)
GLUCOSE: 95 mg/dL (ref 70–99)
Potassium: 4 mEq/L (ref 3.5–5.1)
Sodium: 140 mEq/L (ref 135–145)

## 2014-07-17 NOTE — Assessment & Plan Note (Signed)
Abdominal pain. 68 year old gentleman w/ 3 days history of left lower quadrant abdominal pain. History of appendectomy,Prostatectomy and penicillin allergies. Last colonoscopy 2011 showing diverticula and 2 polyps . Most likely diagnosis is diverticulitis. Udip mod blood Discuss options: 1.CT, labs , antibiotics 2. Labs, empiric antibiotics, close observation, ER if symptoms severe understanding he may get slt worse Elected #2 Plan:  UA, urine culture, CBC, BMP, x-ray of the abdomen Start Cipro and Flagyl See instructions

## 2014-07-24 ENCOUNTER — Ambulatory Visit (INDEPENDENT_AMBULATORY_CARE_PROVIDER_SITE_OTHER): Payer: Medicare Other | Admitting: Internal Medicine

## 2014-07-24 ENCOUNTER — Encounter: Payer: Self-pay | Admitting: Internal Medicine

## 2014-07-24 VITALS — BP 138/71 | HR 71 | Temp 98.1°F | Wt 199.0 lb

## 2014-07-24 DIAGNOSIS — Z23 Encounter for immunization: Secondary | ICD-10-CM | POA: Diagnosis not present

## 2014-07-24 DIAGNOSIS — R1032 Left lower quadrant pain: Secondary | ICD-10-CM | POA: Diagnosis not present

## 2014-07-24 NOTE — Progress Notes (Signed)
Pre visit review using our clinic review tool, if applicable. No additional management support is needed unless otherwise documented below in the visit note. 

## 2014-07-24 NOTE — Assessment & Plan Note (Signed)
Since the last time he was here, labs, urine culture and abdominal x-ray were normal. On antibiotics ,  pain is resolved but still bloated and has loose stools. Exam is benign. I believe he did have diverticulitis, recommend to finish antibiotics, take a probiotic. If not completely well he will let me know, may need eval by GI. Discussed the diagnosis of diverticulitis including the fact that it may came back. Recommend a healthy but otherwise regular diet.

## 2014-07-24 NOTE — Patient Instructions (Signed)
Finish the antibiotics Start taking a probiotic over-the-counter (Align) Call if you're not back to normal within 3 weeks Call anytime if your symptoms resurface  Please come back in 4-5 months for a checkup

## 2014-07-24 NOTE — Progress Notes (Signed)
Subjective:    Patient ID: Larry Elliott, male    DOB: April 19, 1946, 68 y.o.   MRN: [id number]  DOS:  07/24/2014 Type of visit - description : f/u Interval history: Recently diagnosed with diverticulitis, on antibiotics, pain resolved. Also wonders about a flu shot   ROS Denies fever or chills No nausea or vomiting Stools are now loose, denies any blood or mucus in the stools.   Past Medical History  Diagnosis Date  . Melanoma 2010    face, sees derm   . Diverticulosis     colon  . Hyperlipidemia   . Hypertension   . Insomnia   . Prostate ca 10/08    s/p robotic surgery  . CAD (coronary artery disease)     CP, cath, non-obstructive  . Anxiety and depression 11/04/2013    Past Surgical History  Procedure Laterality Date  . Appendectomy    . Hernia repair    . Knee arthroscopy Left     meniscal tear  . Prostatectomy  07-2007     robotic,  for prostate cancer    . Shoulder arthroscopy  07-09-2013    Dr Tamera Punt     History   Social History  . Marital Status: Married    Spouse Name: N/A    Number of Children: 2  . Years of Education: N/A   Occupational History  . 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 Smoking status: Never Smoker   . Smokeless tobacco: Never Used  . Alcohol Use: Yes     Comment: socially   . Drug Use: Not on file  . Sexual Activity: Not on file   Other Topics Concern  . Not on file   Social History Narrative   Lives w/ wife         Medication List       This list is accurate as of: 07/24/14 11:59 PM.  Always use your most recent med list.               doxepin 50 MG capsule  Commonly known as:  SINEQUAN  TAKE 1 CAPSULE BY MOUTH DAILY.     enalapril 5 MG tablet  Commonly known as:  VASOTEC  TAKE 1 TABLET BY MOUTH ONCE A DAY     escitalopram 10 MG tablet  Commonly known as:  LEXAPRO  TAKE 1 TABLET BY MOUTH DAILY.     FIBER LAXATIVE PO  Take by mouth.     fish oil-omega-3 fatty acids 1000 MG capsule  Take 2 g by mouth daily.     hydrochlorothiazide 25 MG tablet  Commonly known as:  HYDRODIURIL  TAKE 1 TABLET BY MOUTH ONCE A DAY     lovastatin 20 MG tablet  Commonly known as:  MEVACOR  TAKE 1 TABLET BY MOUTH DAILY AT BEDTIME     multivitamin per tablet  Take 1 tablet by mouth daily.     niacin 500 MG tablet  Take 1,000 mg by mouth daily.     vitamin E 100 UNIT capsule  Take 100 Units by mouth daily.           Objective:   Physical Exam BP 138/71  Pulse 71  Temp(Src) 98.1 F (36.7 C) (Oral)  Wt 199 lb (90.266 kg)  SpO2 98%  General -- alert, well-developed, NAD.   Abdomen-- Not distended, good bowel sounds,soft, non-tender. No rebound or rigidity. No  mass,organomegaly. Extremities-- no pretibial edema bilaterally  Neurologic--  alert & oriented X3. Speech normal, gait appropriate for age, strength symmetric and appropriate for age.  Psych-- Cognition and judgment appear intact. Cooperative with normal attention span and concentration. No anxious or depressed appearing.         Assessment & Plan:    Discuss higher dose flu shot versus regular dose, elected to regular one

## 2014-08-13 ENCOUNTER — Ambulatory Visit (INDEPENDENT_AMBULATORY_CARE_PROVIDER_SITE_OTHER): Payer: Medicare Other | Admitting: Internal Medicine

## 2014-08-13 ENCOUNTER — Encounter: Payer: Self-pay | Admitting: Internal Medicine

## 2014-08-13 VITALS — BP 118/78 | HR 82 | Temp 98.3°F | Wt 201.5 lb

## 2014-08-13 DIAGNOSIS — R1032 Left lower quadrant pain: Secondary | ICD-10-CM | POA: Diagnosis not present

## 2014-08-13 DIAGNOSIS — G4700 Insomnia, unspecified: Secondary | ICD-10-CM | POA: Diagnosis not present

## 2014-08-13 MED ORDER — CLONAZEPAM 0.5 MG PO TABS: 0.5000 mg | ORAL_TABLET | Freq: Every evening | ORAL | Status: DC | PRN

## 2014-08-13 NOTE — Progress Notes (Signed)
Pre visit review using our clinic review tool, if applicable. No additional management support is needed unless otherwise documented below in the visit note. 

## 2014-08-13 NOTE — Progress Notes (Signed)
Subjective:    Patient ID: Larry Elliott, male    DOB: 03/26/1946, 68 y.o.   MRN: [id number]  DOS:  08/13/2014 Type of visit - description : acute Interval history: Since the last time he was seen continue feeling bloated, the feeling is in the whole abdomen, not necessarily worse with eating. Appetite has decreased. Also complains of difficulty sleeping, RLS? Denies any discomfort or itching in the lower extremities but he "feels the need" to move the legs. He thinks it is unable to sleep well because the leg problem.   ROS Again abdominal pain has resolved No fever or chills No nausea, vomiting, diarrhea or blood in the stools Denies constipation but   bowel movements are not satisfactory as they used to be, small amount of stools with each bowel movement No GERD symptoms, and normal Motrin or similar medications intake No anxiety depression No dysuria gross hematuria  Past Medical History  Diagnosis Date  . Melanoma 2010    face, sees derm   . Diverticulosis     colon  . Hyperlipidemia   . Hypertension   . Insomnia   . Prostate ca 10/08    s/p robotic surgery  . CAD (coronary artery disease)     CP, cath, non-obstructive  . Anxiety and depression 11/04/2013    Past Surgical History  Procedure Laterality Date  . Appendectomy    . Hernia repair    . Knee arthroscopy Left     meniscal tear  . Prostatectomy  07-2007     robotic,  for prostate cancer    . Shoulder arthroscopy  07-09-2013    Dr Tamera Punt     History   Social History  . Marital Status: Married    Spouse Name: N/A    Number of Children: 2  . Years of Education: N/A   Occupational History  . 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 Smoking status: Never Smoker   . Smokeless tobacco: Never Used  . Alcohol Use: Yes     Comment: socially   . Drug Use: Not on file  . Sexual Activity: Not on file   Other Topics Concern  . Not on file    Social History Narrative   Lives w/ wife         Medication List       This list is accurate as of: 08/13/14  3:23 PM.  Always use your most recent med list.               doxepin 50 MG capsule  Commonly known as:  SINEQUAN  TAKE 1 CAPSULE BY MOUTH DAILY.     enalapril 5 MG tablet  Commonly known as:  VASOTEC  TAKE 1 TABLET BY MOUTH ONCE A DAY     escitalopram 10 MG tablet  Commonly known as:  LEXAPRO  TAKE 1 TABLET BY MOUTH DAILY.     FIBER LAXATIVE PO  Take by mouth.     fish oil-omega-3 fatty acids 1000 MG capsule  Take 2 g by mouth daily.     hydrochlorothiazide 25 MG tablet  Commonly known as:  HYDRODIURIL  TAKE 1 TABLET BY MOUTH ONCE A DAY     lovastatin 20 MG tablet  Commonly known as:  MEVACOR  TAKE 1 TABLET BY MOUTH DAILY AT BEDTIME     multivitamin per tablet  Take 1 tablet by mouth daily.  niacin 500 MG tablet  Take 1,000 mg by mouth daily.     vitamin E 100 UNIT capsule  Take 100 Units by mouth daily.           Objective:   Physical Exam BP 118/78  Pulse 82  Temp(Src) 98.3 F (36.8 C) (Oral)  Wt 201 lb 8 oz (91.4 kg)  SpO2 94% General -- alert, well-developed, NAD.   Lungs -- normal respiratory effort, no intercostal retractions, no accessory muscle use, and normal breath sounds.  Heart-- normal rate, regular rhythm, no murmur.  Abdomen-- Not distended, good bowel sounds,soft, non-tender. No rebound or rigidity.   Rectal--  Normal sphincter tone. No rectal masses or tenderness. Stool brown, Hemoccult negative , mucus  Extremities-- no pretibial edema bilaterally  Neurologic--  alert & oriented X3. Speech normal, gait appropriate for age, strength symmetric and appropriate for age.  Psych-- Cognition and judgment appear intact. Cooperative with normal attention span and concentration. No anxious or depressed appearing.        Assessment & Plan:

## 2014-08-13 NOTE — Patient Instructions (Signed)
Hold doxepin Try clonazepam 1 or 2  tablets at bedtime

## 2014-08-13 NOTE — Assessment & Plan Note (Addendum)
See previous entry. At this point denies abdominal pain but continued to feel bloated, having poor appetite, insatisfactory BMs. Etiology unclear. Recent CBC and BMP normal Plan:  Proceed with an abdominal CT Refer to GI

## 2014-08-13 NOTE — Assessment & Plan Note (Signed)
Not well-controlled in the last few days, ? RLS Plan: Discontinue doxepin, start clonazepam

## 2014-08-16 ENCOUNTER — Ambulatory Visit (HOSPITAL_BASED_OUTPATIENT_CLINIC_OR_DEPARTMENT_OTHER)
Admission: RE | Admit: 2014-08-16 | Discharge: 2014-08-16 | Disposition: A | Discharge status: Home / Self Care | Payer: Medicare Other | Source: Ambulatory Visit | Attending: Internal Medicine | Admitting: Internal Medicine

## 2014-08-16 DIAGNOSIS — R1032 Left lower quadrant pain: Secondary | ICD-10-CM | POA: Insufficient documentation

## 2014-08-16 DIAGNOSIS — N281 Cyst of kidney, acquired: Secondary | ICD-10-CM | POA: Diagnosis not present

## 2014-08-16 MED ORDER — IOHEXOL 300 MG/ML  SOLN
100.0000 mL | Freq: Once | INTRAMUSCULAR | Status: AC | PRN
  Administered 2014-08-16: 100 mL via INTRAVENOUS

## 2014-08-20 ENCOUNTER — Encounter: Payer: Self-pay | Admitting: Internal Medicine

## 2014-09-10 DIAGNOSIS — K5732 Diverticulitis of large intestine without perforation or abscess without bleeding: Secondary | ICD-10-CM | POA: Diagnosis not present

## 2014-09-10 DIAGNOSIS — Z86010 Personal history of colonic polyps: Secondary | ICD-10-CM | POA: Diagnosis not present

## 2014-09-10 DIAGNOSIS — R194 Change in bowel habit: Secondary | ICD-10-CM | POA: Diagnosis not present

## 2014-09-23 ENCOUNTER — Telehealth: Payer: Self-pay

## 2014-09-23 NOTE — Telephone Encounter (Signed)
Received refill request from [facility] regarding Pts Clonazepam. Informed them that refill was denied because request was to early. # 40 and 1 refill given on 08/13/2014, [facility] replied stating that Pt had already used second refill because he uses 2 tablets daily instead of 1 to 2 tablets PRN. Spoke with Dr. Birdie Riddle who stated Pt may need to come in to have dosage adjusted if he is indeed taking 2 tablets daily. Called Pt and spoke with him regarding refill request to see how he has been taking medication. He states that he normally takes only 1 tablet daily but some nights he does take 2 tablets daily, however, he informed me that he has been out of Clonazepam for several days and wanted to see how he does without Clonazepam. Pt stated he would call back if he feels he needs medication. Informed him I would inform Dr. Larose Kells of his decision.

## 2014-10-02 ENCOUNTER — Other Ambulatory Visit: Payer: Self-pay | Admitting: Gastroenterology

## 2014-10-02 DIAGNOSIS — Z86010 Personal history of colonic polyps: Secondary | ICD-10-CM | POA: Diagnosis not present

## 2014-10-02 DIAGNOSIS — D124 Benign neoplasm of descending colon: Secondary | ICD-10-CM | POA: Diagnosis not present

## 2014-10-02 DIAGNOSIS — K648 Other hemorrhoids: Secondary | ICD-10-CM | POA: Diagnosis not present

## 2014-10-02 DIAGNOSIS — Z09 Encounter for follow-up examination after completed treatment for conditions other than malignant neoplasm: Secondary | ICD-10-CM | POA: Diagnosis not present

## 2014-10-02 LAB — HM COLONOSCOPY: HM Colonoscopy: 5

## 2014-10-07 ENCOUNTER — Encounter: Payer: Self-pay | Admitting: Internal Medicine

## 2014-10-08 ENCOUNTER — Ambulatory Visit (HOSPITAL_BASED_OUTPATIENT_CLINIC_OR_DEPARTMENT_OTHER)
Admission: RE | Admit: 2014-10-08 | Discharge: 2014-10-08 | Disposition: A | Discharge status: Home / Self Care | Payer: Medicare Other | Source: Ambulatory Visit | Attending: Internal Medicine | Admitting: Internal Medicine

## 2014-10-08 ENCOUNTER — Ambulatory Visit (INDEPENDENT_AMBULATORY_CARE_PROVIDER_SITE_OTHER): Payer: Medicare Other | Admitting: Internal Medicine

## 2014-10-08 ENCOUNTER — Encounter: Payer: Self-pay | Admitting: Internal Medicine

## 2014-10-08 VITALS — BP 110/71 | HR 57 | Temp 97.9°F | Wt 199.1 lb

## 2014-10-08 DIAGNOSIS — R1032 Left lower quadrant pain: Secondary | ICD-10-CM

## 2014-10-08 LAB — URINALYSIS, ROUTINE W REFLEX MICROSCOPIC
Bilirubin Urine: NEGATIVE
HGB URINE DIPSTICK: NEGATIVE
KETONES UR: NEGATIVE
Leukocytes, UA: NEGATIVE
NITRITE: NEGATIVE
RBC / HPF: NONE SEEN (ref 0–?)
Specific Gravity, Urine: >=1.03 — AB (ref 1.000–1.030)
Total Protein, Urine: NEGATIVE
Urine Glucose: NEGATIVE
Urobilinogen, UA: 0.2 (ref 0.0–1.0)
WBC, UA: NONE SEEN (ref 0–?)
pH: 5 (ref 5.0–8.0)

## 2014-10-08 LAB — POCT URINALYSIS DIPSTICK
BILIRUBIN UA: NEGATIVE
Blood, UA: NEGATIVE
GLUCOSE UA: NEGATIVE
Ketones, UA: NEGATIVE
LEUKOCYTES UA: NEGATIVE
NITRITE UA: NEGATIVE
Spec Grav, UA: >=1.03
UROBILINOGEN UA: 2
pH, UA: 5

## 2014-10-08 LAB — CBC WITH DIFFERENTIAL/PLATELET
BASOS PCT: 0.5 % (ref 0.0–3.0)
Basophils Absolute: 0 10*3/uL (ref 0.0–0.1)
Eosinophils Absolute: 0.2 10*3/uL (ref 0.0–0.7)
Eosinophils Relative: 2.6 % (ref 0.0–5.0)
HCT: 42.5 % (ref 39.0–52.0)
HEMOGLOBIN: 14.1 g/dL (ref 13.0–17.0)
LYMPHS ABS: 1.3 10*3/uL (ref 0.7–4.0)
Lymphocytes Relative: 19.8 % (ref 12.0–46.0)
MCHC: 33.1 g/dL (ref 30.0–36.0)
MCV: 94.2 fl (ref 78.0–100.0)
MONOS PCT: 9.8 % (ref 3.0–12.0)
Monocytes Absolute: 0.6 10*3/uL (ref 0.1–1.0)
NEUTROS ABS: 4.3 10*3/uL (ref 1.4–7.7)
Neutrophils Relative %: 67.3 % (ref 43.0–77.0)
PLATELETS: 327 10*3/uL (ref 150.0–400.0)
RBC: 4.51 Mil/uL (ref 4.22–5.81)
RDW: 14.3 % (ref 11.5–15.5)
WBC: 6.4 10*3/uL (ref 4.0–10.5)

## 2014-10-08 MED ORDER — CLONAZEPAM 0.5 MG PO TABS
0.5000 mg | ORAL_TABLET | Freq: Every evening | ORAL | Status: DC | PRN
Start: 2014-10-08 — End: 2014-12-15

## 2014-10-08 MED ORDER — CIPROFLOXACIN HCL 500 MG PO TABS: 500.0000 mg | ORAL_TABLET | Freq: Two times a day (BID) | ORAL | Status: DC

## 2014-10-08 MED ORDER — METRONIDAZOLE 500 MG PO TABS
500.0000 mg | ORAL_TABLET | Freq: Three times a day (TID) | ORAL | Status: DC
Start: 2014-10-08 — End: 2014-11-24

## 2014-10-08 NOTE — Patient Instructions (Signed)
Get your blood work before you leave   Stop by the first floor and get the XR    Take antibiotics as prescribed  Call or go to the ER if symptoms are severe, fever, chills, blood in the stools or if you are not improving in the next 3 or 4 days.  Please schedule a visit with Dr. Oletta Lamas in few weeks.

## 2014-10-08 NOTE — Progress Notes (Signed)
Subjective:    Patient ID: Larry Elliott, male    DOB: 02/25/1946, 68 y.o.   MRN: [id number]  DOS:  10/08/2014 Type of visit - description : acute Interval history: Symptoms started 3 days ago gradually with pain in the left lower quadrant, pain is on and off but he has had persistent tightness in that area. Symptoms are similar to the pain he had about 3 months ago. She saw GI recently, had a colonoscopy last week, reportedly has diverticulosis.  ROS No fever chills. Appetite is normal. BMs normal, last BM today. No nausea, vomiting, diarrhea or blood in the stools. No dysuria, gross hematuria difficulty urinating  Past Medical History  Diagnosis Date  . Melanoma 2010    face, sees derm   . Diverticulosis     colon  . Hyperlipidemia   . Hypertension   . Insomnia   . Prostate ca 10/08    s/p robotic surgery  . CAD (coronary artery disease)     CP, cath, non-obstructive  . Anxiety and depression 11/04/2013    Past Surgical History  Procedure Laterality Date  . Appendectomy    . Hernia repair    . Knee arthroscopy Left     meniscal tear  . Prostatectomy  07-2007     robotic,  for prostate cancer    . Shoulder arthroscopy  07-09-2013    Dr Tamera Punt     History   Social History  . Marital Status: Married    Spouse Name: N/A    Number of Children: 2  . Years of Education: N/A   Occupational History  . 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 Smoking status: Never Smoker   . Smokeless tobacco: Never Used  . Alcohol Use: Yes     Comment: socially   . Drug Use: Not on file  . Sexual Activity: Not on file   Other Topics Concern  . Not on file   Social History Narrative   Lives w/ wife         Medication List       This list is accurate as of: 10/08/14  6:26 PM.  Always use your most recent med list.               ciprofloxacin 500 MG tablet  Commonly known as:  CIPRO  Take 1 tablet (500  mg total) by mouth 2 (two) times daily.     clonazePAM 0.5 MG tablet  Commonly known as:  KLONOPIN  Take 1-2 tablets (0.5-1 mg total) by mouth at bedtime as needed for anxiety.     doxepin 50 MG capsule  Commonly known as:  SINEQUAN  TAKE 1 CAPSULE BY MOUTH DAILY.---HOLD HOLD     enalapril 5 MG tablet  Commonly known as:  VASOTEC  TAKE 1 TABLET BY MOUTH ONCE A DAY     escitalopram 10 MG tablet  Commonly known as:  LEXAPRO  TAKE 1 TABLET BY MOUTH DAILY.     FIBER LAXATIVE PO  Take by mouth.     fish oil-omega-3 fatty acids 1000 MG capsule  Take 2 g by mouth daily.     hydrochlorothiazide 25 MG tablet  Commonly known as:  HYDRODIURIL  TAKE 1 TABLET BY MOUTH ONCE A DAY     lovastatin 20 MG tablet  Commonly known as:  MEVACOR  TAKE 1 TABLET BY MOUTH DAILY AT BEDTIME  metroNIDAZOLE 500 MG tablet  Commonly known as:  FLAGYL  Take 1 tablet (500 mg total) by mouth 3 (three) times daily.     multivitamin per tablet  Take 1 tablet by mouth daily.     niacin 500 MG tablet  Take 1,000 mg by mouth daily.     vitamin E 100 UNIT capsule  Take 100 Units by mouth daily.           Objective:   Physical Exam  Abdominal:     BP 110/71 mmHg  Pulse 57  Temp(Src) 97.9 F (36.6 C) (Oral)  Wt 199 lb 2 oz (90.323 kg)  SpO2 97% General -- alert, well-developed, NAD.  Lungs -- normal respiratory effort, no intercostal retractions, no accessory muscle use, and normal breath sounds.  Heart-- normal rate, regular rhythm, no murmur.  Abdomen-- Not distended, good bowel sounds,soft, tender at the LLQ, see graphic. No rebound or rigidity. No mass,organomegaly. No mass or inguinal hernias Extremities-- no pretibial edema bilaterally  Neurologic--  alert & oriented X3. Speech normal, gait appropriate for age, strength symmetric and appropriate for age.  Psych-- Cognition and judgment appear intact. Cooperative with normal attention span and concentration. No anxious or depressed  appearing.       Assessment & Plan:

## 2014-10-08 NOTE — Progress Notes (Signed)
Pre visit review using our clinic review tool, if applicable. No additional management support is needed unless otherwise documented below in the visit note. 

## 2014-10-08 NOTE — Assessment & Plan Note (Signed)
68 year old gentleman w/ 3 days history of left lower quadrant abdominal pain. History of appendectomy,Prostatectomy and penicillin allergies. Had similar symptoms 2 months ago, responded to antibiotics, had CT show diverticuli but no diverticulitis (CT was done after he improved). Was referred to GI, colonoscopy 10/02/2014; Ispoke with GI, he had a single polyp and diverticuli. Suspect another episode of  diverticulitis. Plan: CBC, x-ray, discussed care  with GI, antibiotics, UA, ER if symptoms increase. See Dr. Oletta Lamas in few weeks

## 2014-10-09 ENCOUNTER — Encounter: Payer: Self-pay | Admitting: Internal Medicine

## 2014-10-09 NOTE — Telephone Encounter (Signed)
Error

## 2014-10-10 LAB — URINE CULTURE
Colony Count: NO GROWTH
ORGANISM ID, BACTERIA: NO GROWTH

## 2014-10-28 ENCOUNTER — Encounter: Payer: Self-pay | Admitting: Internal Medicine

## 2014-11-21 ENCOUNTER — Other Ambulatory Visit: Payer: Self-pay | Admitting: Internal Medicine

## 2014-11-24 ENCOUNTER — Encounter: Payer: Self-pay | Admitting: Internal Medicine

## 2014-11-24 ENCOUNTER — Ambulatory Visit (INDEPENDENT_AMBULATORY_CARE_PROVIDER_SITE_OTHER): Payer: Medicare Other | Admitting: Internal Medicine

## 2014-11-24 VITALS — BP 117/79 | HR 72 | Temp 98.6°F | Ht 70.0 in | Wt 200.5 lb

## 2014-11-24 DIAGNOSIS — F419 Anxiety disorder, unspecified: Secondary | ICD-10-CM

## 2014-11-24 DIAGNOSIS — F418 Other specified anxiety disorders: Secondary | ICD-10-CM

## 2014-11-24 DIAGNOSIS — E785 Hyperlipidemia, unspecified: Secondary | ICD-10-CM

## 2014-11-24 DIAGNOSIS — I10 Essential (primary) hypertension: Secondary | ICD-10-CM | POA: Diagnosis not present

## 2014-11-24 DIAGNOSIS — R1032 Left lower quadrant pain: Secondary | ICD-10-CM | POA: Diagnosis not present

## 2014-11-24 DIAGNOSIS — F329 Major depressive disorder, single episode, unspecified: Secondary | ICD-10-CM

## 2014-11-24 DIAGNOSIS — F32A Depression, unspecified: Secondary | ICD-10-CM

## 2014-11-24 NOTE — Assessment & Plan Note (Signed)
Recheck a FLP, see instructions

## 2014-11-24 NOTE — Patient Instructions (Signed)
Stop by the front desk and schedule labs to be done within few days (fasting)   Please come back to the office by 05-2015 for a physical exam. Come back fasting

## 2014-11-24 NOTE — Assessment & Plan Note (Addendum)
See previous visit from last month, he took antibiotics, symptoms resolved within a week, he felt well and decided not to see GI. Plan: Monitor symptoms, let me know if symptoms resurface

## 2014-11-24 NOTE — Progress Notes (Signed)
Pre visit review using our clinic review tool, if applicable. No additional management support is needed unless otherwise documented below in the visit note. 

## 2014-11-24 NOTE — Progress Notes (Signed)
Subjective:    Patient ID: Larry Elliott, male    DOB: 26-Jul-1946, 69 y.o.   MRN: [id number]  DOS:  11/24/2014 Type of visit - description : f/u Interval history: He was recently seen with presumed diverticulitis, status post antibiotics,  he improved within a week. Anxiety well controlled, not taking doxepin.    ROS Denies chest pain or difficulty breathing. No nausea, vomiting, diarrhea or abdominal pain  Past Medical History  Diagnosis Date  . Melanoma 2010    face, sees derm   . Diverticulosis     colon  . Hyperlipidemia   . Hypertension   . Insomnia   . Prostate ca 10/08    s/p robotic surgery  . CAD (coronary artery disease)     CP, cath, non-obstructive  . Anxiety and depression 11/04/2013    Past Surgical History  Procedure Laterality Date  . Appendectomy    . Hernia repair    . Knee arthroscopy Left     meniscal tear  . Prostatectomy  07-2007     robotic,  for prostate cancer    . Shoulder arthroscopy  07-09-2013    Dr Tamera Punt     History   Social History  . Marital Status: Married    Spouse Name: N/A    Number of Children: 2  . Years of Education: N/A   Occupational History  . 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 Smoking status: Never Smoker   . Smokeless tobacco: Never Used  . Alcohol Use: Yes     Comment: socially   . Drug Use: Not on file  . Sexual Activity: Not on file   Other Topics Concern  . Not on file   Social History Narrative   Lives w/ wife         Medication List       This list is accurate as of: 11/24/14  7:08 PM.  Always use your most recent med list.               clonazePAM 0.5 MG tablet  Commonly known as:  KLONOPIN  Take 1-2 tablets (0.5-1 mg total) by mouth at bedtime as needed for anxiety.     enalapril 5 MG tablet  Commonly known as:  VASOTEC  TAKE 1 TABLET BY MOUTH ONCE A DAY     escitalopram 10 MG tablet  Commonly known as:  LEXAPRO    TAKE 1 TABLET BY MOUTH DAILY.     FIBER LAXATIVE PO  Take by mouth.     fish oil-omega-3 fatty acids 1000 MG capsule  Take 2 g by mouth daily.     hydrochlorothiazide 25 MG tablet  Commonly known as:  HYDRODIURIL  TAKE 1 TABLET BY MOUTH ONCE A DAY     lovastatin 20 MG tablet  Commonly known as:  MEVACOR  TAKE 1 TABLET BY MOUTH DAILY AT BEDTIME     multivitamin per tablet  Take 1 tablet by mouth daily.     niacin 500 MG tablet  Take 1,000 mg by mouth daily.     vitamin E 100 UNIT capsule  Take 100 Units by mouth daily.           Objective:   Physical Exam  Constitutional: He is oriented to person, place, and time. He appears well-developed and well-nourished. No distress.  Cardiovascular:  RRR, no rub or gallop  Pulmonary/Chest: Effort normal.  No respiratory distress.  CTA B  Abdominal: Soft. Bowel sounds are normal. He exhibits no distension and no mass. There is no tenderness. There is no rebound and no guarding.  Musculoskeletal: Normal range of motion. He exhibits no edema or tenderness.  Neurological: He is alert and oriented to person, place, and time. No cranial nerve deficit. He exhibits normal muscle tone. Coordination normal.  Speech normal, gait unassisted and normal for age, motor strength appropriate for age   Skin: Skin is warm and dry. No pallor.  Psychiatric: He has a normal mood and affect. His behavior is normal. Judgment and thought content normal.   BP 117/79 mmHg  Pulse 72  Temp(Src) 98.6 F (37 C) (Oral)  Ht 5\' 10"  (1.778 m)  Wt 200 lb 8 oz (90.946 kg)  BMI 28.77 kg/m2  SpO2 94%       Assessment & Plan:

## 2014-11-24 NOTE — Assessment & Plan Note (Addendum)
Currently mood controlled with Lexapro, not needing doxepin.

## 2014-11-25 ENCOUNTER — Other Ambulatory Visit (INDEPENDENT_AMBULATORY_CARE_PROVIDER_SITE_OTHER): Payer: Medicare Other

## 2014-11-25 DIAGNOSIS — I10 Essential (primary) hypertension: Secondary | ICD-10-CM | POA: Diagnosis not present

## 2014-11-25 DIAGNOSIS — E785 Hyperlipidemia, unspecified: Secondary | ICD-10-CM

## 2014-11-25 LAB — LDL CHOLESTEROL, DIRECT: Direct LDL: 109 mg/dL

## 2014-11-25 LAB — BASIC METABOLIC PANEL
BUN: 17 mg/dL (ref 6–23)
CHLORIDE: 103 meq/L (ref 96–112)
CO2: 31 meq/L (ref 19–32)
CREATININE: 0.77 mg/dL (ref 0.40–1.50)
Calcium: 9.2 mg/dL (ref 8.4–10.5)
GFR: 105.84 mL/min (ref >60.00)
GLUCOSE: 91 mg/dL (ref 70–99)
POTASSIUM: 4.2 meq/L (ref 3.5–5.1)
Sodium: 141 mEq/L (ref 135–145)

## 2014-11-25 LAB — LIPID PANEL
Cholesterol: 213 mg/dL — ABNORMAL HIGH (ref 0–200)
HDL: 65.3 mg/dL (ref >39.00)
NONHDL: 147.7
Total CHOL/HDL Ratio: 3
Triglycerides: 265 mg/dL — ABNORMAL HIGH (ref 0.0–149.0)
VLDL: 53 mg/dL — AB (ref 0.0–40.0)

## 2014-12-15 ENCOUNTER — Telehealth: Payer: Self-pay

## 2014-12-15 MED ORDER — CLONAZEPAM 0.5 MG PO TABS: 0.5000 mg | ORAL_TABLET | Freq: Every evening | ORAL | Status: DC | PRN

## 2014-12-15 NOTE — Telephone Encounter (Signed)
Pt is requesting refill on Clonazepam.  Last OV: 11/24/2014 Last Fill: 10/08/2014 # 40 1RF UDS: None  Please advise.

## 2014-12-15 NOTE — Telephone Encounter (Signed)
Faxed to [facility] pharmacy

## 2014-12-15 NOTE — Telephone Encounter (Signed)
Rx printed, awaiting signature by Dr. Paz.  

## 2014-12-15 NOTE — Telephone Encounter (Signed)
Please print #40 and one refill. Will get a UDS on return to the office

## 2014-12-19 ENCOUNTER — Encounter: Payer: Self-pay | Admitting: Internal Medicine

## 2014-12-19 ENCOUNTER — Other Ambulatory Visit: Payer: Self-pay

## 2014-12-19 MED ORDER — ESCITALOPRAM OXALATE 10 MG PO TABS: 10.0000 mg | ORAL_TABLET | Freq: Every day | ORAL | Status: DC

## 2014-12-19 MED ORDER — ENALAPRIL MALEATE 5 MG PO TABS: 5.0000 mg | ORAL_TABLET | Freq: Every day | ORAL | Status: DC

## 2014-12-23 DIAGNOSIS — Z85828 Personal history of other malignant neoplasm of skin: Secondary | ICD-10-CM | POA: Diagnosis not present

## 2014-12-23 DIAGNOSIS — Z85820 Personal history of malignant melanoma of skin: Secondary | ICD-10-CM | POA: Diagnosis not present

## 2014-12-23 DIAGNOSIS — D2371 Other benign neoplasm of skin of right lower limb, including hip: Secondary | ICD-10-CM | POA: Diagnosis not present

## 2014-12-23 DIAGNOSIS — L821 Other seborrheic keratosis: Secondary | ICD-10-CM | POA: Diagnosis not present

## 2014-12-23 DIAGNOSIS — D1800 Hemangioma unspecified site: Secondary | ICD-10-CM | POA: Diagnosis not present

## 2014-12-23 DIAGNOSIS — L814 Other melanin hyperpigmentation: Secondary | ICD-10-CM | POA: Diagnosis not present

## 2014-12-23 DIAGNOSIS — D485 Neoplasm of uncertain behavior of skin: Secondary | ICD-10-CM | POA: Diagnosis not present

## 2014-12-24 DIAGNOSIS — L309 Dermatitis, unspecified: Secondary | ICD-10-CM | POA: Diagnosis not present

## 2014-12-24 DIAGNOSIS — C44712 Basal cell carcinoma of skin of right lower limb, including hip: Secondary | ICD-10-CM | POA: Diagnosis not present

## 2015-01-07 DIAGNOSIS — C44712 Basal cell carcinoma of skin of right lower limb, including hip: Secondary | ICD-10-CM | POA: Diagnosis not present

## 2015-02-19 ENCOUNTER — Telehealth: Payer: Self-pay

## 2015-02-19 ENCOUNTER — Encounter: Payer: Self-pay | Admitting: Internal Medicine

## 2015-02-19 MED ORDER — CLONAZEPAM 0.5 MG PO TABS: 0.5000 mg | ORAL_TABLET | Freq: Every evening | ORAL | Status: DC | PRN

## 2015-02-19 NOTE — Telephone Encounter (Signed)
Pt is requesting refill on Clonazepam.  Last OV: 11/24/2014 Last Fill: 12/15/2014 # 40 1RF UDS: None  Please advise.

## 2015-02-19 NOTE — Telephone Encounter (Signed)
That is ok 

## 2015-02-19 NOTE — Telephone Encounter (Signed)
Rx faxed to [facility].  

## 2015-02-19 NOTE — Telephone Encounter (Signed)
Rx printed, awaiting MD signature.  

## 2015-02-19 NOTE — Telephone Encounter (Signed)
Okay #40 and one refill, please advise patient to get a UDS at his convenience

## 2015-02-19 NOTE — Telephone Encounter (Signed)
Spoke with Pt, Pt requesting to have UDS at next OV.

## 2015-04-15 ENCOUNTER — Telehealth: Payer: Self-pay

## 2015-04-15 MED ORDER — CLONAZEPAM 0.5 MG PO TABS: 0.5000 mg | ORAL_TABLET | Freq: Every evening | ORAL | Status: DC | PRN

## 2015-04-15 NOTE — Telephone Encounter (Signed)
Rx printed, awaiting MD signature.  

## 2015-04-15 NOTE — Telephone Encounter (Signed)
Rx faxed to [place].

## 2015-04-15 NOTE — Telephone Encounter (Signed)
Okay #40 and one refill

## 2015-04-15 NOTE — Telephone Encounter (Signed)
Pt is requesting refill on Clonazepam.  Last OV: 11/24/2014, CPE scheduled for 05/2015 Last Fill: 02/19/2015 #40 1RF UDS: None, due at next visit   Please advise.

## 2015-05-06 ENCOUNTER — Encounter: Payer: Self-pay | Admitting: Internal Medicine

## 2015-06-02 ENCOUNTER — Encounter: Payer: Self-pay | Admitting: Internal Medicine

## 2015-06-02 NOTE — Telephone Encounter (Signed)
Pt is requesting refill on Clonazepam.  Last OV: 11/24/2014, has CPE scheduled 06/04/2015 Last Fill: 04/15/2015 #40 1RF UDS: None, will get at visit on Thursday    Please advise.

## 2015-06-03 ENCOUNTER — Other Ambulatory Visit: Payer: Self-pay

## 2015-06-03 ENCOUNTER — Telehealth: Payer: Self-pay

## 2015-06-03 MED ORDER — CLONAZEPAM 0.5 MG PO TABS: 0.5000 mg | ORAL_TABLET | Freq: Every evening | ORAL | Status: DC | PRN

## 2015-06-03 NOTE — Telephone Encounter (Signed)
LMOVM

## 2015-06-04 ENCOUNTER — Encounter: Payer: Self-pay | Admitting: Internal Medicine

## 2015-06-04 ENCOUNTER — Ambulatory Visit (INDEPENDENT_AMBULATORY_CARE_PROVIDER_SITE_OTHER): Payer: Medicare Other | Admitting: Internal Medicine

## 2015-06-04 VITALS — BP 116/66 | HR 60 | Temp 97.5°F | Ht 70.0 in | Wt 200.1 lb

## 2015-06-04 DIAGNOSIS — G4700 Insomnia, unspecified: Secondary | ICD-10-CM

## 2015-06-04 DIAGNOSIS — Z0000 Encounter for general adult medical examination without abnormal findings: Secondary | ICD-10-CM

## 2015-06-04 DIAGNOSIS — F419 Anxiety disorder, unspecified: Secondary | ICD-10-CM

## 2015-06-04 DIAGNOSIS — E785 Hyperlipidemia, unspecified: Secondary | ICD-10-CM | POA: Diagnosis not present

## 2015-06-04 DIAGNOSIS — F418 Other specified anxiety disorders: Secondary | ICD-10-CM

## 2015-06-04 DIAGNOSIS — Z1159 Encounter for screening for other viral diseases: Secondary | ICD-10-CM

## 2015-06-04 DIAGNOSIS — I10 Essential (primary) hypertension: Secondary | ICD-10-CM

## 2015-06-04 DIAGNOSIS — F329 Major depressive disorder, single episode, unspecified: Secondary | ICD-10-CM

## 2015-06-04 DIAGNOSIS — R739 Hyperglycemia, unspecified: Secondary | ICD-10-CM

## 2015-06-04 LAB — HEPATITIS C ANTIBODY: HCV Ab: NEGATIVE

## 2015-06-04 LAB — LIPID PANEL
CHOLESTEROL: 199 mg/dL (ref 0–200)
HDL: 53.5 mg/dL (ref >39.00)
NonHDL: 145.62
TRIGLYCERIDES: 243 mg/dL — AB (ref 0.0–149.0)
Total CHOL/HDL Ratio: 4
VLDL: 48.6 mg/dL — ABNORMAL HIGH (ref 0.0–40.0)

## 2015-06-04 LAB — BASIC METABOLIC PANEL
BUN: 12 mg/dL (ref 6–23)
CO2: 22 mEq/L (ref 19–32)
Calcium: 8.9 mg/dL (ref 8.4–10.5)
Chloride: 104 mEq/L (ref 96–112)
Creatinine, Ser: 0.73 mg/dL (ref 0.40–1.50)
GFR: 112.39 mL/min (ref >60.00)
GLUCOSE: 96 mg/dL (ref 70–99)
Potassium: 3.8 mEq/L (ref 3.5–5.1)
Sodium: 141 mEq/L (ref 135–145)

## 2015-06-04 LAB — LDL CHOLESTEROL, DIRECT: Direct LDL: 94 mg/dL

## 2015-06-04 LAB — ALT: ALT: 32 U/L (ref 0–53)

## 2015-06-04 LAB — AST: AST: 31 U/L (ref 0–37)

## 2015-06-04 LAB — HEMOGLOBIN A1C: Hgb A1c MFr Bld: 5.5 % (ref 4.6–6.5)

## 2015-06-04 MED ORDER — TRAZODONE HCL 50 MG PO TABS: 50.0000 mg | ORAL_TABLET | Freq: Every evening | ORAL | Status: DC | PRN

## 2015-06-04 NOTE — Assessment & Plan Note (Signed)
Symptoms well-controlled with Lexapro 10 mg

## 2015-06-04 NOTE — Progress Notes (Signed)
Subjective:    Patient ID: Larry Elliott, male    DOB: 1946/03/07, 69 y.o.   MRN: [id number]  DOS:  06/04/2015 Type of visit - description :  Here for Medicare AWV:  1. Risk factors based on Past M, S, F history: reviewed 2. Physical Activities:  Active but no routineexercise  3. Depression/mood: neg screening  4. Hearing:  Has hearing aids  5. ADL's: independent, drives  6. Fall Risk: no recent falls, prevention discussed , see AVS 7. home Safety: does feel safe at home  8. Height, weight, & visual acuity: see VS, sees eye doctor q year y 46. Counseling: provided 10. Labs ordered based on risk factors: if needed  11. Referral Coordination: if needed 12. Care Plan, see assessment and plan , written personalized plan provided , see AVS 13. Cognitive Assessment: motor skills and cognition appropriate for age 6. Care team updated, see Snap Shot  15. End-of-life care discussed , plans to get a H-POA  In addition, today we discussed the following: Hypertension: Good compliance of medication, not ambulatory BPs High cholesterol: Good compliance with prescribed medication, was unable to get niacin lately. Anxiety, insomnia: Anxiety well controlled but has been unable to sleep even after taking clonazepam and melatonin     Review of Systems  Constitutional: No fever. No chills. No unexplained wt changes. No unusual sweats  HEENT: No dental problems, no ear discharge, no facial swelling, no voice changes. No eye discharge, no eye  redness , no  intolerance to light   Respiratory: No wheezing , no  difficulty breathing. No cough , no mucus production  Cardiovascular: No CP, no leg swelling , no  Palpitations  GI: no nausea, no vomiting, no diarrhea , no  abdominal pain.  No blood in the stools. No dysphagia, no odynophagia    Endocrine: No polyphagia, no polyuria , no polydipsia  GU: No dysuria, gross hematuria, difficulty urinating. No urinary urgency, no  frequency.  Musculoskeletal: No joint swellings or unusual aches or pains  Skin: No change in the color of the skin, palor , no  Rash  Allergic, immunologic: No environmental allergies , no  food allergies  Neurological: No dizziness no  syncope. No headaches. No diplopia, no slurred, no slurred speech, no motor deficits, no facial  Numbness  Hematological: No enlarged lymph nodes, no easy bruising , no unusual bleedings  Psychiatry: No suicidal ideas, no hallucinations, no beavior problems, no confusion.  No unusual/severe anxiety, no depression  Past Medical History  Diagnosis Date  . Melanoma 2010    face, sees derm   . Diverticulosis     colon  . Hyperlipidemia   . Hypertension   . Insomnia   . Prostate ca 10/08    s/p robotic surgery  . CAD (coronary artery disease)     CP, cath, non-obstructive  . Anxiety and depression 11/04/2013  . BCC (basal cell carcinoma), leg     Right calf    Past Surgical History  Procedure Laterality Date  . Appendectomy    . Hernia repair    . Knee arthroscopy Left     meniscal tear  . Prostatectomy  07-2007     robotic,  for prostate cancer    . Shoulder arthroscopy  07-09-2013    Dr Tamera Punt     Social History   Social History  . Marital Status: Married    Spouse Name: N/A  . Number of Children: 2  . Years of Education:  N/A   Occupational History  . 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 Smoking status: Never Smoker   . Smokeless tobacco: Never Used  . Alcohol Use: Yes     Comment: socially   . Drug Use: Not on file  . Sexual Activity: Not on file   Other Topics Concern  . Not on file   Social History Narrative   Lives w/ wife    Family History  Problem Relation Age of Onset  . Coronary artery disease Father     dx age 64s  . Hypertension Neg Hx   . Diabetes Neg Hx   . Stroke Mother   . Colon cancer Neg Hx   . Prostate cancer Neg Hx          Medication List       This list is accurate as of: 06/04/15  1:56 PM.  Always use your most recent med list.               enalapril 5 MG tablet  Commonly known as:  VASOTEC  Take 1 tablet (5 mg total) by mouth daily.     escitalopram 10 MG tablet  Commonly known as:  LEXAPRO  Take 1 tablet (10 mg total) by mouth daily.     FIBER LAXATIVE PO  Take by mouth.     fish oil-omega-3 fatty acids 1000 MG capsule  Take 2 g by mouth daily.     hydrochlorothiazide 25 MG tablet  Commonly known as:  HYDRODIURIL  TAKE 1 TABLET BY MOUTH ONCE A DAY     lovastatin 20 MG tablet  Commonly known as:  MEVACOR  TAKE 1 TABLET BY MOUTH DAILY AT BEDTIME     multivitamin per tablet  Take 1 tablet by mouth daily.     niacin 500 MG tablet  Take 1,000 mg by mouth daily.     Red Yeast Rice Extract 600 MG Tabs  Take 2 tablets by mouth 2 (two) times daily.     traZODone 50 MG tablet  Commonly known as:  DESYREL  Take 1-2 tablets (50-100 mg total) by mouth at bedtime as needed for sleep.     vitamin E 100 UNIT capsule  Take 100 Units by mouth daily.           Objective:   Physical Exam BP 116/66 mmHg  Pulse 60  Temp(Src) 97.5 F (36.4 C) (Oral)  Ht 5\' 10"  (1.778 m)  Wt 200 lb 2 oz (90.776 kg)  BMI 28.71 kg/m2  SpO2 98% General:   Well developed, well nourished . NAD.  Neck:  Full range of motion. Supple. No  thyromegaly , normal carotid pulse HEENT:  Normocephalic . Face symmetric, atraumatic Lungs:  CTA B Normal respiratory effort, no intercostal retractions, no accessory muscle use. Heart: RRR,  no murmur.  No pretibial edema bilaterally  Abdomen:  Not distended, soft, non-tender. No rebound or rigidity. No bruit Skin: Exposed areas without rash. Not pale. Not jaundice Neurologic:  alert & oriented X3.  Speech normal, gait appropriate for age and unassisted Strength symmetric and appropriate for age.  Psych: Cognition and judgment appear intact.  Cooperative with  normal attention span and concentration.  Behavior appropriate. No anxious or depressed appearing.    Assessment & Plan:

## 2015-06-04 NOTE — Progress Notes (Signed)
Pre visit review using our clinic review tool, if applicable. No additional management support is needed unless otherwise documented below in the visit note. 

## 2015-06-04 NOTE — Assessment & Plan Note (Addendum)
Td 2009 ;pneumonia shot 2009; prevnar-- 2015; zostavax 2011  CCS: Colonoscopy 2003 ,   09-2010, 09-2014 >>> next 2020 H/o prostate ca : [facility] urology regularly Diet and exercise discussed

## 2015-06-04 NOTE — Assessment & Plan Note (Addendum)
Not sleeping well with clonazepam 2 tablets. Anxiety is well controlled. In the past doxepin helped. Plan: Discontinue clonazepam, good sleep habits discuss, trial with trazodone. See instructions; consider doxepin again

## 2015-06-04 NOTE — Assessment & Plan Note (Signed)
BP well-controlled, continue present care, check a BMP. EKG today: Normal sinus rhythm, at baseline

## 2015-06-04 NOTE — Assessment & Plan Note (Addendum)
Good compliance with Pravachol and fenofibrate. Was unable to get niacin lately, taking  red yeast Rice. Plan: Labs

## 2015-06-04 NOTE — Patient Instructions (Addendum)
Get your blood work before you leave   Stop clonazepam, try trazodone 1-2 tablets at bedtime  HEALTHY SLEEP Sleep hygiene: Basic rules for a good night's sleep  Sleep only as much as you need to feel rested and then get out of bed  Keep a regular sleep schedule  Avoid forcing sleep  Exercise regularly for at least 20 minutes, preferably 4 to 5 hours before bedtime  Avoid caffeinated beverages after lunch  Avoid alcohol near bedtime: no "night cap"  Avoid smoking, especially in the evening  Do not go to bed hungry  Adjust bedroom environment  Avoid prolonged use of light-emitting screens before bedtime   Deal with your worries before bedtime      Fall Prevention and Home Safety Falls cause injuries and can affect all age groups. It is possible to use preventive measures to significantly decrease the likelihood of falls. There are many simple measures which can make your home safer and prevent falls. OUTDOORS  Repair cracks and edges of walkways and driveways.  Remove high doorway thresholds.  Trim shrubbery on the main path into your home.  Have good outside lighting.  Clear walkways of tools, rocks, debris, and clutter.  Check that handrails are not broken and are securely fastened. Both sides of steps should have handrails.  Have leaves, snow, and ice cleared regularly.  Use sand or salt on walkways during winter months.  In the garage, clean up grease or oil spills. BATHROOM  Install night lights.  Install grab bars by the toilet and in the tub and shower.  Use non-skid mats or decals in the tub or shower.  Place a plastic non-slip stool in the shower to sit on, if needed.  Keep floors dry and clean up all water on the floor immediately.  Remove soap buildup in the tub or shower on a regular basis.  Secure bath mats with non-slip, double-sided rug tape.  Remove throw rugs and tripping hazards from the floors. BEDROOMS  Install night lights.  Make sure  a bedside light is easy to reach.  Do not use oversized bedding.  Keep a telephone by your bedside.  Have a firm chair with side arms to use for getting dressed.  Remove throw rugs and tripping hazards from the floor. KITCHEN  Keep handles on pots and pans turned toward the center of the stove. Use back burners when possible.  Clean up spills quickly and allow time for drying.  Avoid walking on wet floors.  Avoid hot utensils and knives.  Position shelves so they are not too high or low.  Place commonly used objects within easy reach.  If necessary, use a sturdy step stool with a grab bar when reaching.  Keep electrical cables out of the way.  Do not use floor polish or wax that makes floors slippery. If you must use wax, use non-skid floor wax.  Remove throw rugs and tripping hazards from the floor. STAIRWAYS  Never leave objects on stairs.  Place handrails on both sides of stairways and use them. Fix any loose handrails. Make sure handrails on both sides of the stairways are as long as the stairs.  Check carpeting to make sure it is firmly attached along stairs. Make repairs to worn or loose carpet promptly.  Avoid placing throw rugs at the top or bottom of stairways, or properly secure the rug with carpet tape to prevent slippage. Get rid of throw rugs, if possible.  Have an electrician put in a  light switch at the top and bottom of the stairs. OTHER FALL PREVENTION TIPS  Wear low-heel or rubber-soled shoes that are supportive and fit well. Wear closed toe shoes.  When using a stepladder, make sure it is fully opened and both spreaders are firmly locked. Do not climb a closed stepladder.  Add color or contrast paint or tape to grab bars and handrails in your home. Place contrasting color strips on first and last steps.  Learn and use mobility aids as needed. Install an electrical emergency response system.  Turn on lights to avoid dark areas. Replace light bulbs  that burn out immediately. Get light switches that glow.  Arrange furniture to create clear pathways. Keep furniture in the same place.  Firmly attach carpet with non-skid or double-sided tape.  Eliminate uneven floor surfaces.  Select a carpet pattern that does not visually hide the edge of steps.  Be aware of all pets. OTHER HOME SAFETY TIPS  Set the water temperature for 120 F (48.8 C).  Keep emergency numbers on or near the telephone.  Keep smoke detectors on every level of the home and near sleeping areas. Document Released: 09/30/2002 Document Revised: 04/10/2012 Document Reviewed: 12/30/2011 [facility] Patient Information 2015 [place], [place]. This information is not intended to replace advice given to you by your health care provider. Make sure you discuss any questions you have with your health care provider.   Preventive Care for Adults Ages 5 and over  Blood pressure check.** / Every 1 to 2 years.  Lipid and cholesterol check.**/ Every 5 years beginning at age 4.  Lung cancer screening. / Every year if you are aged 104-80 years and have a 30-pack-year history of smoking and currently smoke or have quit within the past 15 years. Yearly screening is stopped once you have quit smoking for at least 15 years or develop a health problem that would prevent you from having lung cancer treatment.  Fecal occult blood test (FOBT) of stool. / Every year beginning at age 41 and continuing until age 59. You may not have to do this test if you get a colonoscopy every 10 years.  Flexible sigmoidoscopy** or colonoscopy.** / Every 5 years for a flexible sigmoidoscopy or every 10 years for a colonoscopy beginning at age 28 and continuing until age 49.  Hepatitis C blood test.** / For all people born from [birth year] through 1967 and any individual with known risks for hepatitis C.  Abdominal aortic aneurysm (AAA) screening.** / A one-time screening for ages 27 to 33 years who are current or  former smokers.  Skin self-exam. / Monthly.  Influenza vaccine. / Every year.  Tetanus, diphtheria, and acellular pertussis (Tdap/Td) vaccine.** / 1 dose of Td every 10 years.  Varicella vaccine.** / Consult your health care provider.  Zoster vaccine.** / 1 dose for adults aged 71 years or older.  Pneumococcal 13-valent conjugate (PCV13) vaccine.** / Consult your health care provider.  Pneumococcal polysaccharide (PPSV23) vaccine.** / 1 dose for all adults aged 34 years and older.  Meningococcal vaccine.** / Consult your health care provider.  Hepatitis A vaccine.** / Consult your health care provider.  Hepatitis B vaccine.** / Consult your health care provider.  Haemophilus influenzae type b (Hib) vaccine.** / Consult your health care provider. **Family history and personal history of risk and conditions may change your health care provider's recommendations. Document Released: 12/06/2001 Document Revised: 10/15/2013 Document Reviewed: 03/07/2011 [facility] Patient Information 2015 [place], [place]. This information is not intended to replace  advice given to you by your health care provider. Make sure you discuss any questions you have with your health care provider.

## 2015-06-20 ENCOUNTER — Other Ambulatory Visit: Payer: Self-pay | Admitting: Internal Medicine

## 2015-06-24 ENCOUNTER — Encounter: Payer: Self-pay | Admitting: Internal Medicine

## 2015-06-27 ENCOUNTER — Other Ambulatory Visit: Payer: Self-pay | Admitting: Internal Medicine

## 2015-07-02 DIAGNOSIS — L821 Other seborrheic keratosis: Secondary | ICD-10-CM | POA: Diagnosis not present

## 2015-07-02 DIAGNOSIS — D1800 Hemangioma unspecified site: Secondary | ICD-10-CM | POA: Diagnosis not present

## 2015-07-02 DIAGNOSIS — Z85820 Personal history of malignant melanoma of skin: Secondary | ICD-10-CM | POA: Diagnosis not present

## 2015-07-02 DIAGNOSIS — D485 Neoplasm of uncertain behavior of skin: Secondary | ICD-10-CM | POA: Diagnosis not present

## 2015-07-02 DIAGNOSIS — Z85828 Personal history of other malignant neoplasm of skin: Secondary | ICD-10-CM | POA: Diagnosis not present

## 2015-07-02 DIAGNOSIS — L814 Other melanin hyperpigmentation: Secondary | ICD-10-CM | POA: Diagnosis not present

## 2015-07-02 DIAGNOSIS — D225 Melanocytic nevi of trunk: Secondary | ICD-10-CM | POA: Diagnosis not present

## 2015-07-02 DIAGNOSIS — D2271 Melanocytic nevi of right lower limb, including hip: Secondary | ICD-10-CM | POA: Diagnosis not present

## 2015-07-03 DIAGNOSIS — H25099 Other age-related incipient cataract, unspecified eye: Secondary | ICD-10-CM | POA: Diagnosis not present

## 2015-07-03 DIAGNOSIS — C4441 Basal cell carcinoma of skin of scalp and neck: Secondary | ICD-10-CM | POA: Diagnosis not present

## 2015-07-03 DIAGNOSIS — H43813 Vitreous degeneration, bilateral: Secondary | ICD-10-CM | POA: Diagnosis not present

## 2015-07-03 DIAGNOSIS — H52223 Regular astigmatism, bilateral: Secondary | ICD-10-CM | POA: Diagnosis not present

## 2015-07-03 DIAGNOSIS — H5203 Hypermetropia, bilateral: Secondary | ICD-10-CM | POA: Diagnosis not present

## 2015-07-13 ENCOUNTER — Encounter: Payer: Self-pay | Admitting: Internal Medicine

## 2015-07-14 ENCOUNTER — Ambulatory Visit: Payer: Medicare Other

## 2015-07-16 ENCOUNTER — Ambulatory Visit: Payer: No Typology Code available for payment source

## 2015-07-17 ENCOUNTER — Ambulatory Visit (INDEPENDENT_AMBULATORY_CARE_PROVIDER_SITE_OTHER): Payer: Medicare Other | Admitting: *Deleted

## 2015-07-17 DIAGNOSIS — Z23 Encounter for immunization: Secondary | ICD-10-CM

## 2015-07-17 NOTE — Progress Notes (Signed)
Pre visit review using our clinic review tool, if applicable. No additional management support is needed unless otherwise documented below in the visit note. 

## 2015-07-29 DIAGNOSIS — C61 Malignant neoplasm of prostate: Secondary | ICD-10-CM | POA: Diagnosis not present

## 2015-07-29 DIAGNOSIS — Z8546 Personal history of malignant neoplasm of prostate: Secondary | ICD-10-CM | POA: Diagnosis not present

## 2015-07-29 DIAGNOSIS — N5231 Erectile dysfunction following radical prostatectomy: Secondary | ICD-10-CM | POA: Diagnosis not present

## 2015-08-03 DIAGNOSIS — C4441 Basal cell carcinoma of skin of scalp and neck: Secondary | ICD-10-CM | POA: Diagnosis not present

## 2015-08-06 ENCOUNTER — Other Ambulatory Visit: Payer: Self-pay | Admitting: Internal Medicine

## 2015-08-06 NOTE — Telephone Encounter (Signed)
Okay #60 and 3 refills, no need for UDS for trazodone

## 2015-08-06 NOTE — Telephone Encounter (Signed)
Pt is requesting refill on Trazodone.  Last OV: 06/04/2015 Last Fill: 06/04/2015 #60 and 1RF UDS: None  Will need UDS and contract at next OV.  Please advise.

## 2015-08-06 NOTE — Telephone Encounter (Signed)
Rx sent 

## 2015-08-11 ENCOUNTER — Other Ambulatory Visit: Payer: Self-pay | Admitting: Internal Medicine

## 2015-08-29 ENCOUNTER — Other Ambulatory Visit: Payer: Self-pay | Admitting: Internal Medicine

## 2015-09-04 ENCOUNTER — Encounter: Payer: Self-pay | Admitting: Internal Medicine

## 2015-09-04 ENCOUNTER — Ambulatory Visit (INDEPENDENT_AMBULATORY_CARE_PROVIDER_SITE_OTHER): Payer: Medicare Other | Admitting: Internal Medicine

## 2015-09-04 VITALS — BP 116/72 | HR 71 | Temp 98.1°F | Ht 70.0 in | Wt 194.4 lb

## 2015-09-04 DIAGNOSIS — G4700 Insomnia, unspecified: Secondary | ICD-10-CM

## 2015-09-04 DIAGNOSIS — I10 Essential (primary) hypertension: Secondary | ICD-10-CM | POA: Diagnosis not present

## 2015-09-04 DIAGNOSIS — Z09 Encounter for follow-up examination after completed treatment for conditions other than malignant neoplasm: Secondary | ICD-10-CM

## 2015-09-04 DIAGNOSIS — E785 Hyperlipidemia, unspecified: Secondary | ICD-10-CM

## 2015-09-04 LAB — BASIC METABOLIC PANEL
BUN: 12 mg/dL (ref 6–23)
CHLORIDE: 103 meq/L (ref 96–112)
CO2: 30 meq/L (ref 19–32)
Calcium: 9.6 mg/dL (ref 8.4–10.5)
Creatinine, Ser: 0.85 mg/dL (ref 0.40–1.50)
GFR: 94.22 mL/min (ref >60.00)
Glucose, Bld: 100 mg/dL — ABNORMAL HIGH (ref 70–99)
POTASSIUM: 3.7 meq/L (ref 3.5–5.1)
Sodium: 142 mEq/L (ref 135–145)

## 2015-09-04 NOTE — Patient Instructions (Signed)
Get your blood work before you leave    Check the  blood pressure 1-2  times a month    Be sure your blood pressure is between 110/65 and  145/85.  if it is consistently higher or lower, let me know   Next visit  for a physical exam by 05-2016     Please schedule an appointment at the front desk Please come back fasting

## 2015-09-04 NOTE — Progress Notes (Signed)
Pre visit review using our clinic review tool, if applicable. No additional management support is needed unless otherwise documented below in the visit note. 

## 2015-09-04 NOTE — Progress Notes (Signed)
Subjective:    Patient ID: Larry Elliott, male    DOB: 1946-07-14, 69 y.o.   MRN: [id number]  DOS:  09/04/2015 Type of visit - description : Routine visit Interval history:  Since the last visit he is doing well, medication for insomnia is working. Good compliance with all medications, no apparent side effects. No ambulatory BPs.  Review of Systems Not difficulty breathing No nausea, vomiting, diarrhea. Saw Dr. Alinda Money, urology, got good reports, denies incontinence  Past Medical History  Diagnosis Date  . Melanoma ([place]) 2010    face, sees derm   . Diverticulosis     colon  . Hyperlipidemia   . Hypertension   . Insomnia   . Prostate CA ([place]) 10/08    s/p robotic surgery  . CAD (coronary artery disease)     CP, cath, non-obstructive  . Anxiety and depression 11/04/2013  . BCC (basal cell carcinoma), leg     Right calf, L nape of neck at hairline  . Spermatocele     Left  . Erectile dysfunction following radical prostatectomy     Past Surgical History  Procedure Laterality Date  . Appendectomy    . Hernia repair    . Knee arthroscopy Left     meniscal tear  . Prostatectomy  07-2007     robotic,  for prostate cancer    . Shoulder arthroscopy  07-09-2013    Dr Tamera Punt     Social History   Social History  . Marital Status: Married    Spouse Name: N/A  . Number of Children: 2  . Years of Education: N/A   Occupational History  . 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 Smoking status: Never Smoker   . Smokeless tobacco: Never Used  . Alcohol Use: Yes     Comment: socially   . Drug Use: Not on file  . Sexual Activity: Not on file   Other Topics Concern  . Not on file   Social History Narrative   Lives w/ wife         Medication List       This list is accurate as of: 09/04/15 11:59 PM.  Always use your most recent med list.               enalapril 5 MG tablet  Commonly known as:   VASOTEC  Take 1 tablet (5 mg total) by mouth daily.     escitalopram 10 MG tablet  Commonly known as:  LEXAPRO  Take 1 tablet (10 mg total) by mouth daily.     FIBER LAXATIVE PO  Take by mouth.     fish oil-omega-3 fatty acids 1000 MG capsule  Take 2 g by mouth daily.     hydrochlorothiazide 25 MG tablet  Commonly known as:  HYDRODIURIL  TAKE 1 TABLET BY MOUTH ONCE DAILY     lovastatin 20 MG tablet  Commonly known as:  MEVACOR  Take 1 tablet (20 mg total) by mouth at bedtime.     multivitamin per tablet  Take 1 tablet by mouth daily.     niacin 500 MG tablet  Take 1,000 mg by mouth daily.     Red Yeast Rice Extract 600 MG Tabs  Take 2 tablets by mouth 2 (two) times daily.     traZODone 50 MG tablet  Commonly known as:  DESYREL  Take 1-2 tablets (50-100 mg  total) by mouth at bedtime as needed for sleep.     Turmeric 500 MG Tabs  Take 1,000 mg by mouth daily.     vitamin E 100 UNIT capsule  Take 100 Units by mouth daily.           Objective:   Physical Exam BP 116/72 mmHg  Pulse 71  Temp(Src) 98.1 F (36.7 C) (Oral)  Ht 5\' 10"  (1.778 m)  Wt 194 lb 6 oz (88.168 kg)  BMI 27.89 kg/m2  SpO2 97% General:   Well developed, well nourished . NAD.  HEENT:  Normocephalic . Face symmetric, atraumatic Lungs:  CTA B Normal respiratory effort, no intercostal retractions, no accessory muscle use. Heart: RRR,  no murmur.  No pretibial edema bilaterally  Skin: Not pale. Not jaundice Neurologic:  alert & oriented X3.  Speech normal, gait appropriate for age and unassisted Psych--  Cognition and judgment appear intact.  Cooperative with normal attention span and concentration.  Behavior appropriate. No anxious or depressed appearing.      Assessment & Plan:   Assessment> HTN Hyperlipidemia Anxiety depression insomnia  dc clonazepam 8-16, rx trazodone CAD, non-obstructive Prostate cancer-- 2008, robotic surgery, no incontinence, Dr Alinda Money ED past surgery    spermatocele, left Skin --Melanoma 2010 --BCCs  PLAN: HTN: Well-controlled, check a BMP  Hyperlipidemia: Last FLP satisfactory, he was taking red yeast Rice and plans to go back on it. Anxiety depression: Well-controlled Insomnia: At the last visit, we started trazodone, good results no apparent side effects. Prostate cancer: Recently saw neurology, got good reports. RTC 9 months, CPX

## 2015-09-06 DIAGNOSIS — Z09 Encounter for follow-up examination after completed treatment for conditions other than malignant neoplasm: Secondary | ICD-10-CM | POA: Insufficient documentation

## 2015-09-06 NOTE — Assessment & Plan Note (Signed)
HTN: Well-controlled, check a BMP  Hyperlipidemia: Last FLP satisfactory, he was taking red yeast Rice and plans to go back on it. Anxiety depression: Well-controlled Insomnia: At the last visit, we started trazodone, good results no apparent side effects. Prostate cancer: Recently saw neurology, got good reports. RTC 9 months, CPX

## 2015-09-18 ENCOUNTER — Other Ambulatory Visit: Payer: Self-pay | Admitting: Internal Medicine

## 2015-11-26 ENCOUNTER — Encounter: Payer: Self-pay | Admitting: Internal Medicine

## 2015-12-16 ENCOUNTER — Other Ambulatory Visit: Payer: Self-pay | Admitting: Internal Medicine

## 2015-12-18 IMAGING — CT CT ABD-PELV W/ CM
2 of 5 series · 15 of 46 positions shown, 17 images · IV contrast (APPLIED)
Comparison: No priors.

CLINICAL DATA: 68-year-old male with history of lower abdominal
pain since June 2014. Recent history of diverticulitis.

EXAM:
CT ABDOMEN AND PELVIS WITH CONTRAST
TECHNIQUE: Multidetector CT imaging of the abdomen and pelvis was performed
using the standard protocol following bolus administration of
intravenous contrast.
CONTRAST:  100mL OMNIPAQUE IOHEXOL 300 MG/ML  SOLN

[Series 2: abd/pelvis 5.0 b31f · axial · 0.79mm/px · z∈[+748,+1188]mm · 12 of 98 slices shown, 14 images]
[im 5/98  soft-tissue]
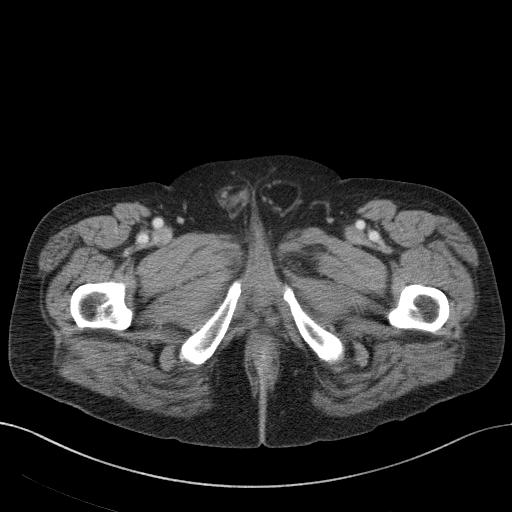
[im 5/98  bone]
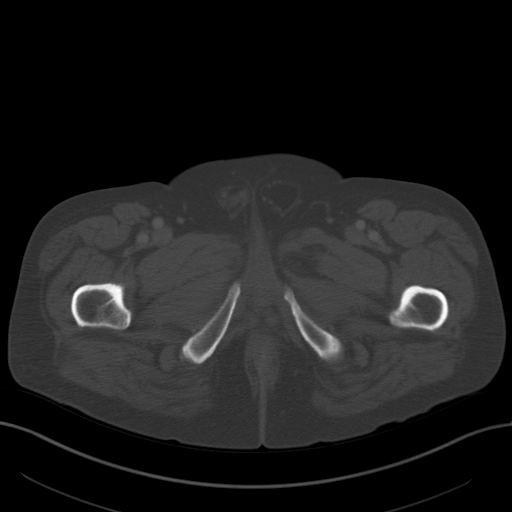
[im 15/98  soft-tissue]
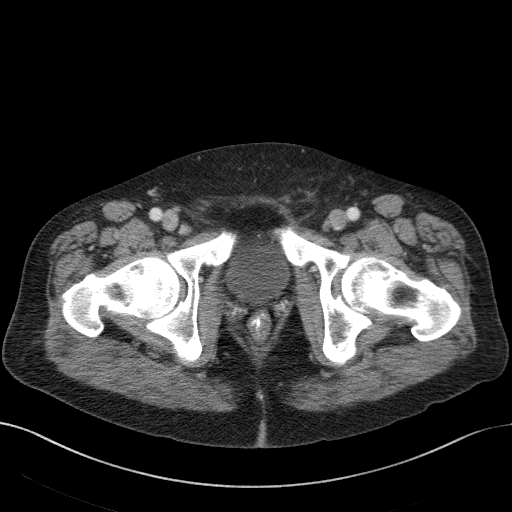
[im 20/98  soft-tissue]
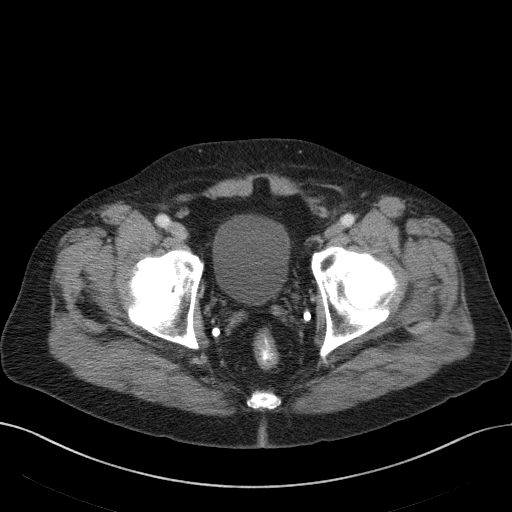
[im 30/98  soft-tissue]
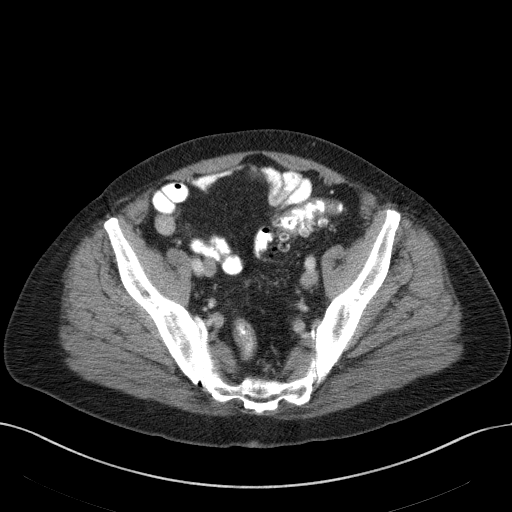
[im 39/98  soft-tissue]
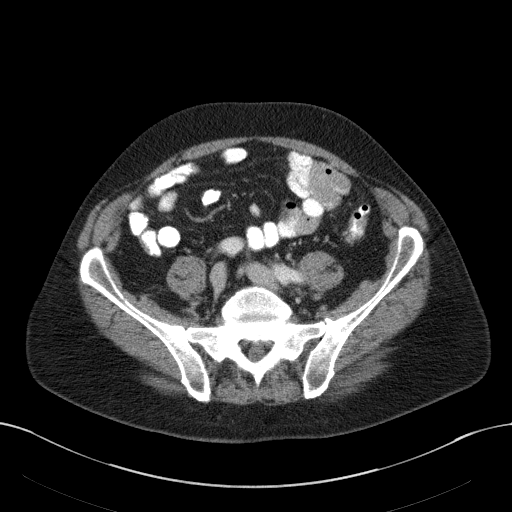
[im 44/98  soft-tissue]
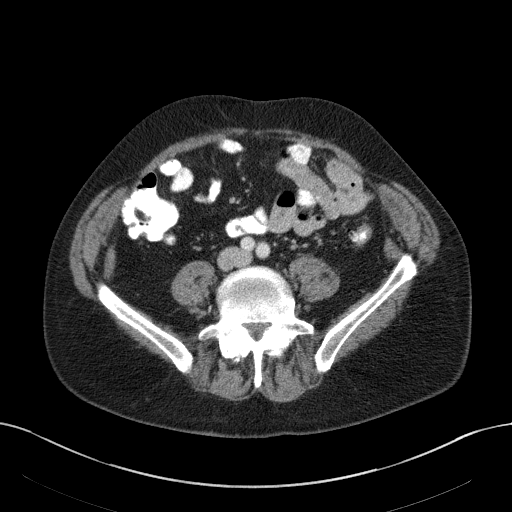
[im 54/98  soft-tissue]
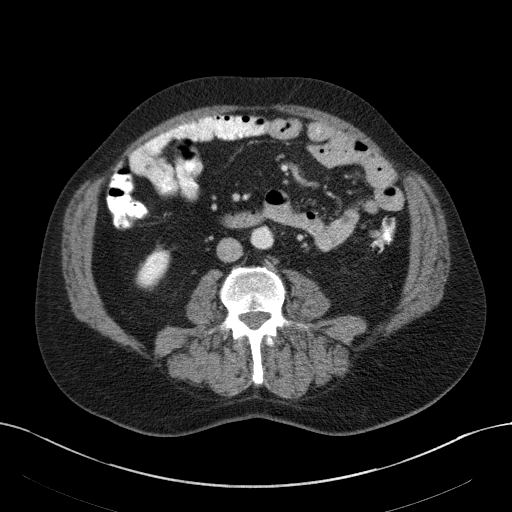
[im 59/98  soft-tissue]
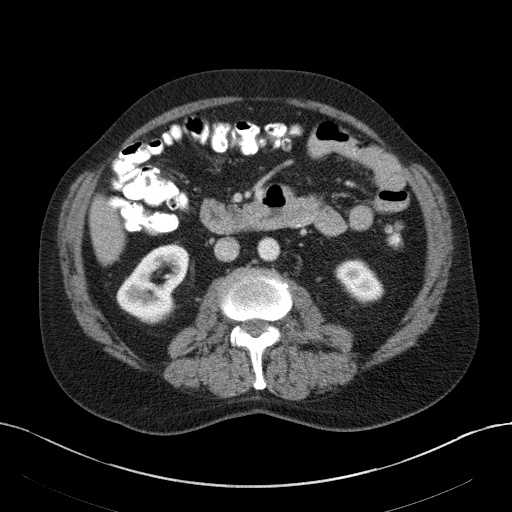
[im 68/98  soft-tissue]
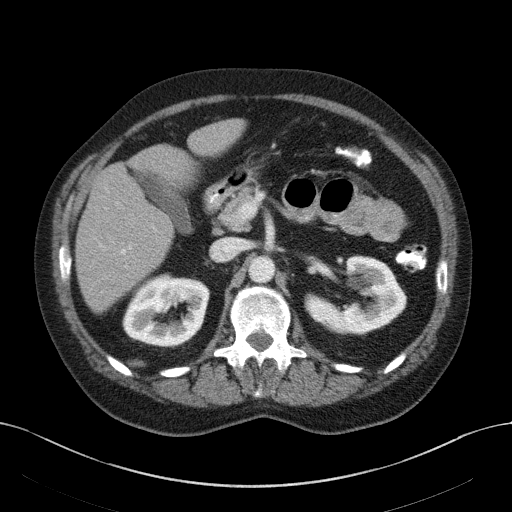
[im 68/98  bone]
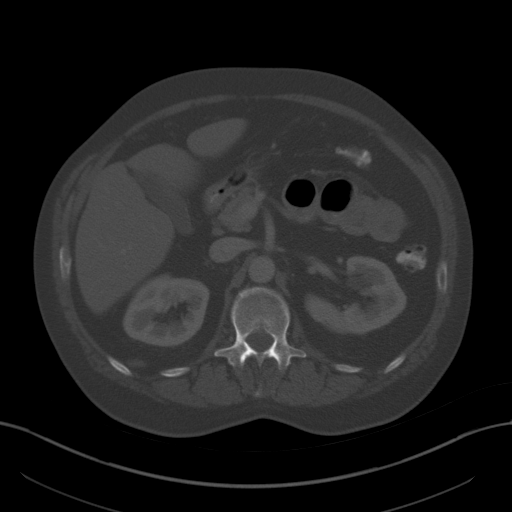
[im 78/98  soft-tissue]
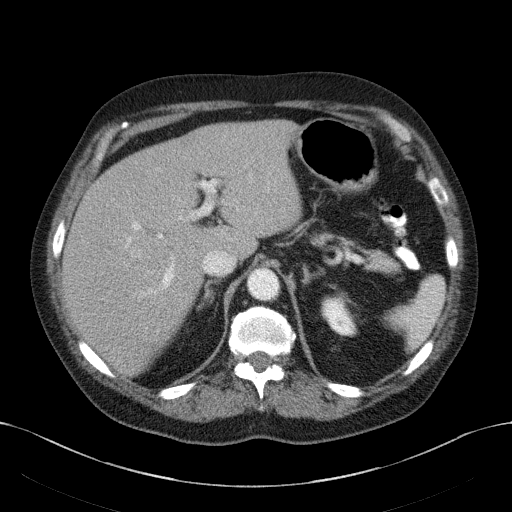
[im 83/98  soft-tissue]
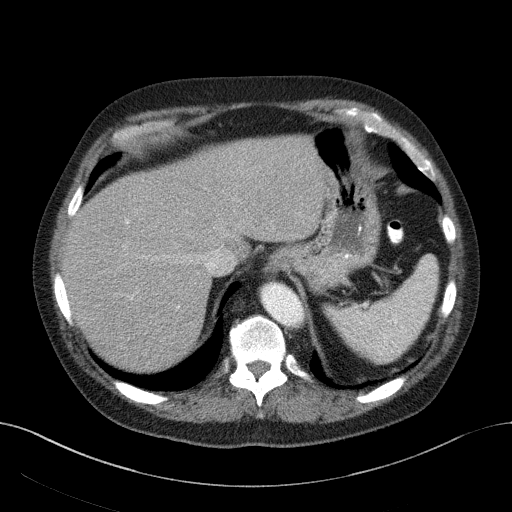
[im 93/98  soft-tissue]
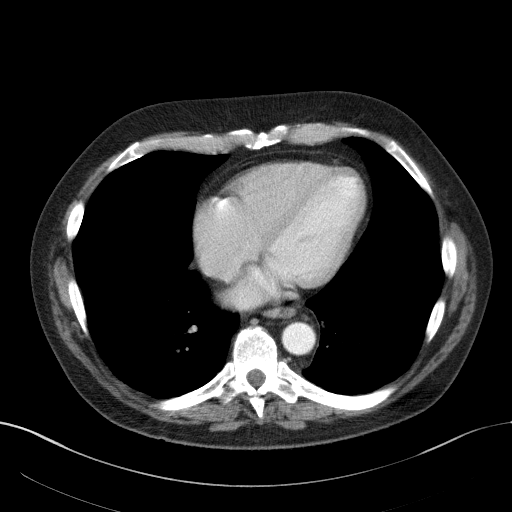

[Series 5: abd/pelvis 3.0 coronal · coronal · 0.95mm/px · 3 of 85 slices shown]
[im 29/85  soft-tissue]
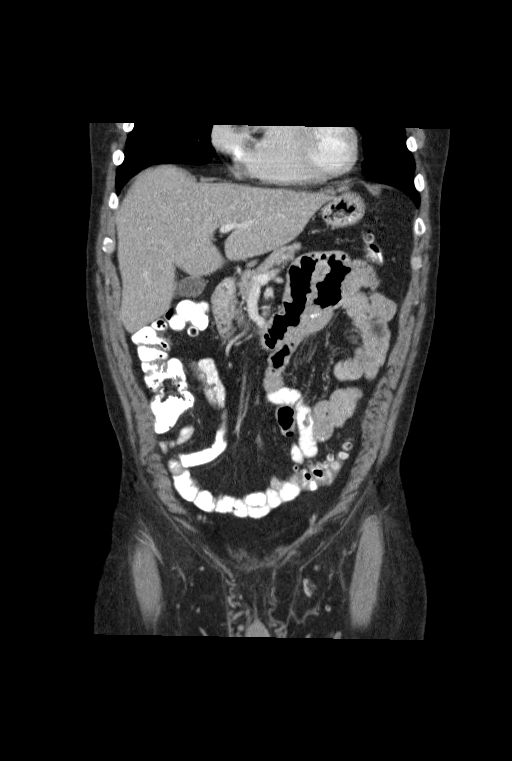
[im 38/85  soft-tissue]
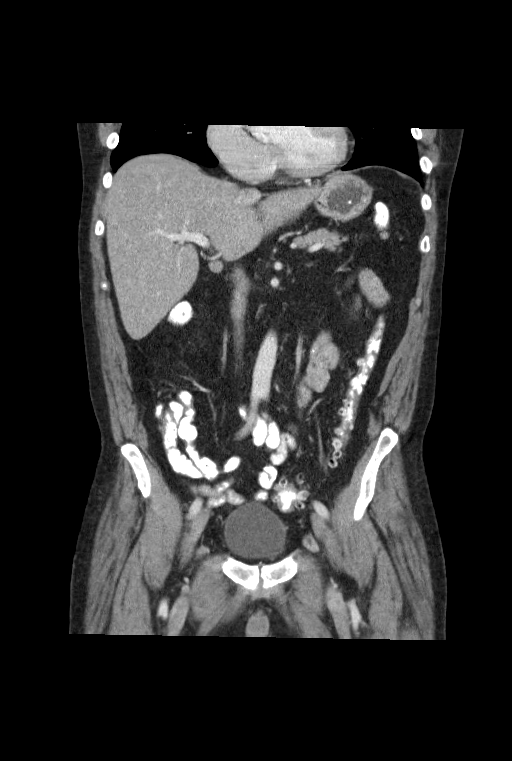
[im 47/85  soft-tissue]
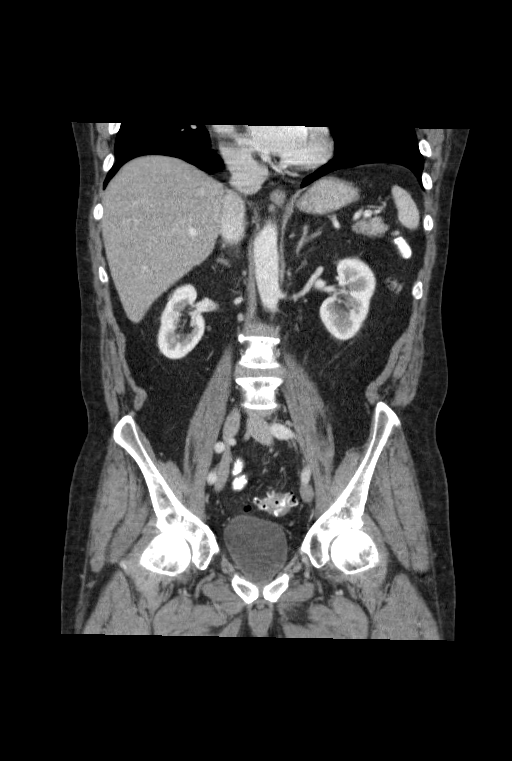

[15 of 46 positions shown; findings below may reference images not displayed]

FINDINGS: Lower chest: Small amount of subsegmental atelectasis and/or
scarring in the lung bases bilaterally.

Hepatobiliary: No focal hepatic lesions. No intra or extrahepatic
biliary ductal dilatation. Gallbladder is normal in appearance.

Pancreas: Unremarkable.

Spleen: Unremarkable.

Adrenals/Urinary Tract: Bilateral adrenal glands are normal in
appearance. 2 cm exophytic low-attenuation lesion in the upper pole
of the left kidney is compatible with a simple cyst. There also
several small low-attenuation lesions in the peripelvic aspect of
the lower pole the left kidney, compatible with small parapelvic
cysts. Right kidney is normal in appearance. No
hydroureteronephrosis to indicate urinary tract obstruction at this
time. Urinary bladder is normal in appearance.

Stomach/Bowel: The stomach is normal in appearance. No pathologic
dilatation of the small bowel or colon. Numerous colonic
diverticulae are noted, particularly in the region of the sigmoid
colon, without surrounding inflammatory changes to suggest an acute
diverticulitis at this time.

Vascular/Lymphatic: No significant atherosclerotic disease in the
abdominal or pelvic vasculature. No lymphadenopathy noted in the
abdomen or pelvis.

Reproductive: Status post prostatectomy.

Other: Small left inguinal hernia containing only fat incidentally
noted. No significant volume of ascites. No pneumoperitoneum.

Musculoskeletal: There are no aggressive appearing lytic or blastic
lesions noted in the visualized portions of the skeleton.
IMPRESSION: 1. No acute findings to account for the patient's symptoms.
2. Extensive colonic diverticulosis, particularly in the sigmoid
colon, without surrounding inflammatory changes to suggest an acute
diverticulitis at this time.
3. 2 cm simple cyst in the upper pole of the left kidney, and
several small parapelvic cysts in the lower pole of the left kidney
are incidentally noted.

## 2016-01-15 ENCOUNTER — Encounter: Payer: Self-pay | Admitting: Internal Medicine

## 2016-01-16 ENCOUNTER — Other Ambulatory Visit: Payer: Self-pay | Admitting: Internal Medicine

## 2016-01-18 NOTE — Telephone Encounter (Signed)
Pt is requesting refill on Trazodone.  Last OV: 09/04/2015 Last Fill: 08/06/2015 #60 and 3 RF UDS: None  Please advise.

## 2016-01-18 NOTE — Telephone Encounter (Signed)
Ok 60 and 5 RF 

## 2016-01-18 NOTE — Telephone Encounter (Signed)
Rx sent 

## 2016-01-29 DIAGNOSIS — Z0000 Encounter for general adult medical examination without abnormal findings: Secondary | ICD-10-CM | POA: Diagnosis not present

## 2016-01-29 DIAGNOSIS — N5201 Erectile dysfunction due to arterial insufficiency: Secondary | ICD-10-CM | POA: Diagnosis not present

## 2016-01-29 DIAGNOSIS — C61 Malignant neoplasm of prostate: Secondary | ICD-10-CM | POA: Diagnosis not present

## 2016-01-29 LAB — PSA

## 2016-02-04 ENCOUNTER — Encounter: Payer: Self-pay | Admitting: Internal Medicine

## 2016-02-26 ENCOUNTER — Encounter: Payer: Self-pay | Admitting: Internal Medicine

## 2016-02-26 ENCOUNTER — Telehealth: Payer: Self-pay | Admitting: *Deleted

## 2016-02-26 NOTE — Telephone Encounter (Signed)
I have my annual physical scheduled with Dr. Larose Kells on 8/16. My wife & I are taking a 10 day rail excursion thru the [facility] from 8/12 - 8/22. Please reschedule my physical. Please make the new appt. early in the morning since I have to fast. Thanks, Larry Elliott

## 2016-03-01 NOTE — Telephone Encounter (Signed)
Called. Left detailed vm informing pt that I am calling in regards to message sent to reschedule appt. Advised pt to reschedule via MyChart or call our office.

## 2016-04-25 ENCOUNTER — Other Ambulatory Visit: Payer: Self-pay | Admitting: Internal Medicine

## 2016-05-09 ENCOUNTER — Other Ambulatory Visit: Payer: Self-pay | Admitting: Internal Medicine

## 2016-06-08 ENCOUNTER — Encounter: Payer: No Typology Code available for payment source | Admitting: Internal Medicine

## 2016-07-22 ENCOUNTER — Ambulatory Visit (INDEPENDENT_AMBULATORY_CARE_PROVIDER_SITE_OTHER): Payer: Medicare Other | Admitting: Internal Medicine

## 2016-07-22 ENCOUNTER — Encounter: Payer: Self-pay | Admitting: Internal Medicine

## 2016-07-22 VITALS — BP 126/76 | HR 63 | Temp 97.9°F | Resp 14 | Ht 70.0 in | Wt 197.5 lb

## 2016-07-22 DIAGNOSIS — F419 Anxiety disorder, unspecified: Secondary | ICD-10-CM

## 2016-07-22 DIAGNOSIS — Z23 Encounter for immunization: Secondary | ICD-10-CM | POA: Diagnosis not present

## 2016-07-22 DIAGNOSIS — E785 Hyperlipidemia, unspecified: Secondary | ICD-10-CM | POA: Diagnosis not present

## 2016-07-22 DIAGNOSIS — I10 Essential (primary) hypertension: Secondary | ICD-10-CM

## 2016-07-22 DIAGNOSIS — F329 Major depressive disorder, single episode, unspecified: Secondary | ICD-10-CM

## 2016-07-22 DIAGNOSIS — F418 Other specified anxiety disorders: Secondary | ICD-10-CM

## 2016-07-22 DIAGNOSIS — G4700 Insomnia, unspecified: Secondary | ICD-10-CM

## 2016-07-22 LAB — BASIC METABOLIC PANEL
BUN: 12 mg/dL (ref 6–23)
CALCIUM: 8.6 mg/dL (ref 8.4–10.5)
CHLORIDE: 101 meq/L (ref 96–112)
CO2: 29 meq/L (ref 19–32)
CREATININE: 0.77 mg/dL (ref 0.40–1.50)
GFR: 105.35 mL/min (ref >60.00)
Glucose, Bld: 93 mg/dL (ref 70–99)
Potassium: 3.6 mEq/L (ref 3.5–5.1)
SODIUM: 137 meq/L (ref 135–145)

## 2016-07-22 LAB — CBC WITH DIFFERENTIAL/PLATELET
BASOS PCT: 0.4 % (ref 0.0–3.0)
Basophils Absolute: 0 10*3/uL (ref 0.0–0.1)
Eosinophils Absolute: 0.1 10*3/uL (ref 0.0–0.7)
Eosinophils Relative: 1.3 % (ref 0.0–5.0)
HEMATOCRIT: 42.5 % (ref 39.0–52.0)
HEMOGLOBIN: 14.7 g/dL (ref 13.0–17.0)
LYMPHS PCT: 18.8 % (ref 12.0–46.0)
Lymphs Abs: 1.2 10*3/uL (ref 0.7–4.0)
MCHC: 34.6 g/dL (ref 30.0–36.0)
MCV: 92.3 fl (ref 78.0–100.0)
MONO ABS: 0.6 10*3/uL (ref 0.1–1.0)
Monocytes Relative: 9 % (ref 3.0–12.0)
NEUTROS ABS: 4.4 10*3/uL (ref 1.4–7.7)
Neutrophils Relative %: 70.5 % (ref 43.0–77.0)
Platelets: 274 10*3/uL (ref 150.0–400.0)
RBC: 4.6 Mil/uL (ref 4.22–5.81)
RDW: 14.3 % (ref 11.5–15.5)
WBC: 6.3 10*3/uL (ref 4.0–10.5)

## 2016-07-22 LAB — LIPID PANEL
CHOLESTEROL: 213 mg/dL — AB (ref 0–200)
HDL: 48.8 mg/dL (ref >39.00)
Total CHOL/HDL Ratio: 4

## 2016-07-22 LAB — LDL CHOLESTEROL, DIRECT: Direct LDL: 98 mg/dL

## 2016-07-22 NOTE — Progress Notes (Signed)
Pre visit review using our clinic review tool, if applicable. No additional management support is needed unless otherwise documented below in the visit note. 

## 2016-07-22 NOTE — Assessment & Plan Note (Signed)
Td 2009 ;pneumonia shot 2009; prevnar-- 2015; zostavax 2011 . Flu shot today  CCS: Colonoscopy 2003 ,   09-2010, 09-2014 >>> next 2020 H/o prostate ca : [facility] urology regularly Diet and exercise discussed

## 2016-07-22 NOTE — Assessment & Plan Note (Signed)
HTN: Continue Vasotec, HCTZ. Check a CMP and CBC Hyperlipidemia: Continue Mevacor, check a FLP Anxiety depression and insomnia: Well-controlled on Lexapro and Desyrel. CAD: Nonobstructed, controlling CV RF, allergic to aspirin Prostate cancer: Follow-up by urology however after 10 year most likely he will come back to PCP. RTC one year.

## 2016-07-22 NOTE — Patient Instructions (Signed)
Get your blood work before you leave   Next visit in one year, CPX  Fall Prevention and [place] cause injuries and can affect all age groups. It is possible to use preventive measures to significantly decrease the likelihood of falls. There are many simple measures which can make your home safer and prevent falls. OUTDOORS  Repair cracks and edges of walkways and driveways.  Remove high doorway thresholds.  Trim shrubbery on the main path into your home.  Have good outside lighting.  Clear walkways of tools, rocks, debris, and clutter.  Check that handrails are not broken and are securely fastened. Both sides of steps should have handrails.  Have leaves, snow, and ice cleared regularly.  Use sand or salt on walkways during winter months.  In the garage, clean up grease or oil spills. BATHROOM  Install night lights.  Install grab bars by the toilet and in the tub and shower.  Use non-skid mats or decals in the tub or shower.  Place a plastic non-slip stool in the shower to sit on, if needed.  Keep floors dry and clean up all water on the floor immediately.  Remove soap buildup in the tub or shower on a regular basis.  Secure bath mats with non-slip, double-sided rug tape.  Remove throw rugs and tripping hazards from the floors. BEDROOMS  Install night lights.  Make sure a bedside light is easy to reach.  Do not use oversized bedding.  Keep a telephone by your bedside.  Have a firm chair with side arms to use for getting dressed.  Remove throw rugs and tripping hazards from the floor. KITCHEN  Keep handles on pots and pans turned toward the center of the stove. Use back burners when possible.  Clean up spills quickly and allow time for drying.  Avoid walking on wet floors.  Avoid hot utensils and knives.  Position shelves so they are not too high or low.  Place commonly used objects within easy reach.  If necessary, use a sturdy step stool  with a grab bar when reaching.  Keep electrical cables out of the way.  Do not use floor polish or wax that makes floors slippery. If you must use wax, use non-skid floor wax.  Remove throw rugs and tripping hazards from the floor. STAIRWAYS  Never leave objects on stairs.  Place handrails on both sides of stairways and use them. Fix any loose handrails. Make sure handrails on both sides of the stairways are as long as the stairs.  Check carpeting to make sure it is firmly attached along stairs. Make repairs to worn or loose carpet promptly.  Avoid placing throw rugs at the top or bottom of stairways, or properly secure the rug with carpet tape to prevent slippage. Get rid of throw rugs, if possible.  Have an electrician put in a light switch at the top and bottom of the stairs. OTHER FALL PREVENTION TIPS  Wear low-heel or rubber-soled shoes that are supportive and fit well. Wear closed toe shoes.  When using a stepladder, make sure it is fully opened and both spreaders are firmly locked. Do not climb a closed stepladder.  Add color or contrast paint or tape to grab bars and handrails in your home. Place contrasting color strips on first and last steps.  Learn and use mobility aids as needed. Install an electrical emergency response system.  Turn on lights to avoid dark areas. Replace light bulbs that burn out immediately. Get light  out immediately. Get light switches that glow.  Arrange furniture to create clear pathways. Keep furniture in the same place.  Firmly attach carpet with non-skid or double-sided tape.  Eliminate uneven floor surfaces.  Select a carpet pattern that does not visually hide the edge of steps.  Be aware of all pets. OTHER HOME SAFETY TIPS  Set the water temperature for 120 F (48.8 C).  Keep emergency numbers on or near the telephone.  Keep smoke detectors on every level of the home and near sleeping areas. Document Released: 09/30/2002 Document Revised: 04/10/2012  Document Reviewed: 12/30/2011 ExitCare Patient Information 2015 [employer]. This information is not intended to replace advice given to you by your health care provider. Make sure you discuss any questions you have with your health care provider.   Preventive Care for Adults Ages 65 and over  Blood pressure check.** / Every 1 to 2 years.  Lipid and cholesterol check.**/ Every 5 years beginning at age 20.  Lung cancer screening. / Every year if you are aged 55-80 years and have a 30-pack-year history of smoking and currently smoke or have quit within the past 15 years. Yearly screening is stopped once you have quit smoking for at least 15 years or develop a health problem that would prevent you from having lung cancer treatment.  Fecal occult blood test (FOBT) of stool. / Every year beginning at age 50 and continuing until age 75. You may not have to do this test if you get a colonoscopy every 10 years.  Flexible sigmoidoscopy** or colonoscopy.** / Every 5 years for a flexible sigmoidoscopy or every 10 years for a colonoscopy beginning at age 50 and continuing until age 75.  Hepatitis C blood test.** / For all people born from 1945 through 1965 and any individual with known risks for hepatitis C.  Abdominal aortic aneurysm (AAA) screening.** / A one-time screening for ages 65 to 75 years who are current or former smokers.  Skin self-exam. / Monthly.  Influenza vaccine. / Every year.  Tetanus, diphtheria, and acellular pertussis (Tdap/Td) vaccine.** / 1 dose of Td every 10 years.  Varicella vaccine.** / Consult your health care provider.  Zoster vaccine.** / 1 dose for adults aged 60 years or older.  Pneumococcal 13-valent conjugate (PCV13) vaccine.** / Consult your health care provider.  Pneumococcal polysaccharide (PPSV23) vaccine.** / 1 dose for all adults aged 65 years and older.  Meningococcal vaccine.** / Consult your health care provider.  Hepatitis A vaccine.** /  Consult your health care provider.  Hepatitis B vaccine.** / Consult your health care provider.  Haemophilus influenzae type b (Hib) vaccine.** / Consult your health care provider. **Family history and personal history of risk and conditions may change your health care provider's recommendations. Document Released: 12/06/2001 Document Revised: 10/15/2013 Document Reviewed: 03/07/2011 ExitCare Patient Information 2015 [employer]. This information is not intended to replace advice given to you by your health care provider. Make sure you discuss any questions you have with your health care provider.   

## 2016-07-22 NOTE — Progress Notes (Signed)
Subjective:    Patient ID: Larry Elliott, male    DOB: Feb 03, 1946, 70 y.o.   MRN: [id number]  DOS:  07/22/2016 Type of visit - description :   Here for Medicare AWV:  1. Risk factors based on Past M, S, F history: reviewed 2. Physical Activities:  Active but no routine exercise  3. Depression/mood: neg screening  4. Hearing:  Has hearing aids  5. ADL's: independent, drives  6. Fall Risk: no recent falls, prevention discussed , see AVS 7. home Safety: does feel safe at home  8. Height, weight, & visual acuity: see VS, sees eye doctor q year   9. Counseling: provided 10. Labs ordered based on risk factors: if needed  11. Referral Coordination: if needed 12. Care Plan, see assessment and plan , written personalized plan provided , see AVS 13. Cognitive Assessment: motor skills and cognition appropriate for age 41. Care team updated today  15. End-of-life care discussed , pending H-POA  In addition, today we discussed the following: HTN: Good med compliance, no recent ambulatory BPs High cholesterol: On statins, no apparent side effects Anxiety depression and insomnia: Currently well controlled with medications  Review of Systems Constitutional: No fever. No chills. No unexplained wt changes. No unusual sweats  HEENT: No dental problems, no ear discharge, no facial swelling, no voice changes. No eye discharge, no eye  redness , no  intolerance to light   Respiratory: No wheezing , no  difficulty breathing. No cough , no mucus production  Cardiovascular: No CP, no leg swelling , no  Palpitations  GI: no nausea, no vomiting, no diarrhea , no  abdominal pain.  No blood in the stools. No dysphagia, no odynophagia    Endocrine: No polyphagia, no polyuria , no polydipsia  GU: No dysuria, gross hematuria, difficulty urinating. No urinary urgency, no frequency.  Musculoskeletal: No joint swellings or unusual aches or pains  Skin: No change in the color of the skin, palor , no   Rash  Allergic, immunologic: No environmental allergies , no  food allergies  Neurological: No dizziness no  syncope. No headaches. No diplopia, no slurred, no slurred speech, no motor deficits, no facial  Numbness  Hematological: No enlarged lymph nodes, no easy bruising , no unusual bleedings  Psychiatry: No suicidal ideas, no hallucinations, no beavior problems, no confusion.  No unusual/severe anxiety, no depression   Past Medical History:  Diagnosis Date  . Anxiety and depression 11/04/2013  . BCC (basal cell carcinoma), leg    Right calf, L nape of neck at hairline  . CAD (coronary artery disease)    CP, cath, non-obstructive  . Diverticulosis    colon  . Erectile dysfunction following radical prostatectomy   . Hyperlipidemia   . Hypertension   . Insomnia   . Melanoma ([place]) 2010   face, sees derm   . Prostate CA ([place]) 10/08   s/p robotic surgery  . Spermatocele    Left    Past Surgical History:  Procedure Laterality Date  . APPENDECTOMY    . HERNIA REPAIR    . KNEE ARTHROSCOPY Left    meniscal tear  . PROSTATECTOMY  07-2007    robotic,  for prostate cancer    . SHOULDER ARTHROSCOPY  07-09-2013   Dr Tamera Punt     Social History   Social History  . Marital status: Married    Spouse name: N/A  . Number of children: 2  . Years of education: N/A  Occupational History  . 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 Smoking status: Never Smoker  . Smokeless tobacco: Never Used  . Alcohol use Yes     Comment: socially   . Drug use: Unknown  . Sexual activity: Not on file   Other Topics Concern  . Not on file   Social History Narrative   Lives w/ wife      Family History  Problem Relation Age of Onset  . Coronary artery disease Father 60    dx age 52s  . Stroke Mother   . Hypertension Neg Hx   . Diabetes Neg Hx   . Colon cancer Neg Hx   . Prostate cancer Neg Hx          Medication List       Accurate as of 07/22/16 10:09 AM. Always use your most recent med list.          enalapril 5 MG tablet Commonly known as:  VASOTEC Take 1 tablet (5 mg total) by mouth daily.   escitalopram 10 MG tablet Commonly known as:  LEXAPRO Take 1 tablet (10 mg total) by mouth daily.   FIBER LAXATIVE PO Take by mouth.   fish oil-omega-3 fatty acids 1000 MG capsule Take 2 g by mouth daily.   hydrochlorothiazide 25 MG tablet Commonly known as:  HYDRODIURIL Take 1 tablet (25 mg total) by mouth daily.   lovastatin 20 MG tablet Commonly known as:  MEVACOR Take 1 tablet (20 mg total) by mouth at bedtime.   multivitamin per tablet Take 1 tablet by mouth daily.   niacin 500 MG tablet Take 1,000 mg by mouth daily.   Red Yeast Rice Extract 600 MG Tabs Take 2 tablets by mouth 2 (two) times daily.   traZODone 50 MG tablet Commonly known as:  DESYREL Take 1-2 tablets (50-100 mg total) by mouth at bedtime as needed for sleep.   Turmeric 500 MG Tabs Take 1,000 mg by mouth daily.   vitamin E 100 UNIT capsule Take 100 Units by mouth daily.          Objective:   Physical Exam BP 126/76 (BP Location: Left Arm, Patient Position: Sitting, Cuff Size: Normal)   Pulse 63   Temp 97.9 F (36.6 C) (Oral)   Resp 14   Ht 5\' 10"  (1.778 m)   Wt 197 lb 8 oz (89.6 kg)   SpO2 97%   BMI 28.34 kg/m   General:   Well developed, well nourished . NAD.  Neck: No  thyromegaly . Normal carotid pulses HEENT:  Normocephalic . Face symmetric, atraumatic Lungs:  CTA B Normal respiratory effort, no intercostal retractions, no accessory muscle use. Heart: RRR,  no murmur.  No pretibial edema bilaterally  Abdomen:  Not distended, soft, non-tender. No rebound or rigidity.   Skin: Exposed areas without rash. Not pale. Not jaundice Neurologic:  alert & oriented X3.  Speech normal, gait appropriate for age and unassisted Strength symmetric and appropriate for age.   Psych: Cognition and judgment appear intact.  Cooperative with normal attention span and concentration.  Behavior appropriate. No anxious or depressed appearing.    Assessment & Plan:   Assessment  HTN Hyperlipidemia Anxiety depression insomnia  dc clonazepam 8-16, rx trazodone CAD, non-obstructive Prostate cancer-- 2008, robotic surgery, no incontinence, Dr Alinda Money ED past surgery  spermatocele, left Skin --Melanoma 2010, BCCs sees derm  PLAN: HTN:  Continue Vasotec, HCTZ. Check a CMP and CBC Hyperlipidemia: Continue Mevacor, check a FLP Anxiety depression and insomnia: Well-controlled on Lexapro and Desyrel. CAD: Nonobstructed, controlling CV RF, allergic to aspirin Prostate cancer: Follow-up by urology however after 10 year most likely he will come back to PCP. RTC one year.

## 2016-08-03 ENCOUNTER — Other Ambulatory Visit: Payer: Self-pay | Admitting: Internal Medicine

## 2016-08-08 ENCOUNTER — Other Ambulatory Visit: Payer: Self-pay | Admitting: Internal Medicine

## 2016-08-08 NOTE — Telephone Encounter (Signed)
Rx sent 

## 2016-08-08 NOTE — Telephone Encounter (Signed)
Ok 60 and 5 RF 

## 2016-08-08 NOTE — Telephone Encounter (Signed)
Pt is requesting refill on Trazodone.  Last OV: 07/22/2016 Last Fill: 01/18/2016 #60 and 5RF UDS: None  Please advise.

## 2016-09-07 ENCOUNTER — Other Ambulatory Visit: Payer: Self-pay | Admitting: Internal Medicine

## 2016-10-31 ENCOUNTER — Encounter: Payer: Self-pay | Admitting: Internal Medicine

## 2016-10-31 ENCOUNTER — Ambulatory Visit (INDEPENDENT_AMBULATORY_CARE_PROVIDER_SITE_OTHER): Payer: Medicare Other | Admitting: Internal Medicine

## 2016-10-31 VITALS — BP 128/78 | HR 68 | Temp 97.7°F | Resp 14 | Ht 70.0 in | Wt 207.0 lb

## 2016-10-31 DIAGNOSIS — E785 Hyperlipidemia, unspecified: Secondary | ICD-10-CM | POA: Diagnosis not present

## 2016-10-31 DIAGNOSIS — J069 Acute upper respiratory infection, unspecified: Secondary | ICD-10-CM

## 2016-10-31 DIAGNOSIS — I10 Essential (primary) hypertension: Secondary | ICD-10-CM | POA: Diagnosis not present

## 2016-10-31 MED ORDER — AZELASTINE HCL 0.1 % NA SOLN: 2.0000 | Freq: Every evening | NASAL | 3 refills | Status: DC | PRN

## 2016-10-31 NOTE — Progress Notes (Signed)
Pre visit review using our clinic review tool, if applicable. No additional management support is needed unless otherwise documented below in the visit note. 

## 2016-10-31 NOTE — Progress Notes (Signed)
Subjective:    Patient ID: Larry Elliott, male    DOB: Jun 07, 1946, 71 y.o.   MRN: [id number]  DOS:  10/31/2016 Type of visit - description : acute Interval history: 4 weeks history of runny nose, last week, he went to [place], it was very cold. He developed some sore throat, dry cough and raspy voice. Taking DayQuil and NyQuil.   Review of Systems No fever chills Mild sinus congestion + Nasal discharge in the last few days, brown to clear. No sputum per se. No nausea or vomiting  Past Medical History:  Diagnosis Date  . Anxiety and depression 11/04/2013  . BCC (basal cell carcinoma), leg    Right calf, L nape of neck at hairline  . CAD (coronary artery disease)    CP, cath, non-obstructive  . Diverticulosis    colon  . Erectile dysfunction following radical prostatectomy   . Hyperlipidemia   . Hypertension   . Insomnia   . Melanoma ([place]) 2010   face, sees derm   . Prostate CA ([place]) 10/08   s/p robotic surgery  . Spermatocele    Left    Past Surgical History:  Procedure Laterality Date  . APPENDECTOMY    . HERNIA REPAIR    . KNEE ARTHROSCOPY Left    meniscal tear  . PROSTATECTOMY  07-2007    robotic,  for prostate cancer    . SHOULDER ARTHROSCOPY  07-09-2013   Dr Tamera Punt     Social History   Social History  . Marital status: Married    Spouse name: N/A  . Number of children: 2  . Years of education: N/A   Occupational History  . 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 Smoking status: Never Smoker  . Smokeless tobacco: Never Used  . Alcohol use Yes     Comment: socially   . Drug use: Unknown  . Sexual activity: Not on file   Other Topics Concern  . Not on file   Social History Narrative   Lives w/ wife    Original from [place] as of 10/31/2016      Reactions   Aspirin    REACTION: swelling in face   Penicillins    REACTION: swelling in face      Medication List         Accurate as of 10/31/16 11:59 PM. Always use your most recent med list.          azelastine 0.1 % nasal spray Commonly known as:  ASTELIN Place 2 sprays into both nostrils at bedtime as needed for rhinitis. Use in each nostril as directed   enalapril 5 MG tablet Commonly known as:  VASOTEC Take 1 tablet (5 mg total) by mouth daily.   FIBER LAXATIVE PO Take by mouth.   fish oil-omega-3 fatty acids 1000 MG capsule Take 2 g by mouth daily.   hydrochlorothiazide 25 MG tablet Commonly known as:  HYDRODIURIL TAKE 1 TABLET (25 MG TOTAL) BY MOUTH DAILY.   lovastatin 20 MG tablet Commonly known as:  MEVACOR TAKE 1 TABLET (20 MG TOTAL) BY MOUTH AT BEDTIME.   multivitamin per tablet Take 1 tablet by mouth daily.   traZODone 50 MG tablet Commonly known as:  DESYREL Take 1-2 tablets (50-100 mg total) by mouth at bedtime as needed for sleep.   Turmeric 500 MG Tabs Take 1,000 mg by mouth  daily.   vitamin E 100 UNIT capsule Take 100 Units by mouth daily.          Objective:   Physical Exam BP 128/78 (BP Location: Left Arm, Patient Position: Sitting, Cuff Size: Normal)   Pulse 68   Temp 97.7 F (36.5 C) (Oral)   Resp 14   Ht 5\' 10"  (1.778 m)   Wt 207 lb (93.9 kg)   SpO2 97%   BMI 29.70 kg/m  General:   Well developed, well nourished . NAD.  HEENT:  Normocephalic . Face symmetric, atraumatic. TMs slightly bulge but not red. Nose congested, sinuses no TTP. Throat symmetric Lungs:  CTA B Normal respiratory effort, no intercostal retractions, no accessory muscle use. Heart: RRR,  no murmur.  No pretibial edema bilaterally  Skin: Not pale. Not jaundice Neurologic:  alert & oriented X3.  Speech normal, gait appropriate for age and unassisted Psych--  Cognition and judgment appear intact.  Cooperative with normal attention span and concentration.  Behavior appropriate. No anxious or depressed appearing.      Assessment & Plan:    Assessment   HTN Hyperlipidemia Anxiety depression insomnia  dc clonazepam 8-16, rx trazodone CAD, non-obstructive Prostate cancer-- 2008, robotic surgery, no incontinence, Dr Alinda Money ED past surgery  spermatocele, left Skin --Melanoma 2010, BCCs sees derm  PLAN: URI: Conservative treatment, if not better consider antibiotics. Add Astelin HTN: Well control Hyperlipidemia: Last triglycerides were elevated, he was taking Pravachol, niacin and red yeas rice. Currently only on Pravachol. Will come back fasting for labs. Anxiety depression and insomnia: Since the last visit, not taking Lexapro, reason?. Feeling well, continue with only Desyrel RTC ~ 06-2017, CPX

## 2016-10-31 NOTE — Patient Instructions (Addendum)
Come back fasting for blood work only, FLP DX dyslipidemia  ==============   Rest, fluids , tylenol  For cough:  Take Mucinex DM twice a day as needed until better  For nasal congestion: Use OTC Nasocort or Flonase : 2 nasal sprays on each side of the nose in the morning until you feel better Use ASTELIN a prescribed spray : 2 nasal sprays on each side of the nose at night until you feel better   Avoid decongestants such as  Pseudoephedrine or phenylephrine   Call if not gradually better over the next  10 days  Call anytime if the symptoms are severe

## 2016-11-01 ENCOUNTER — Other Ambulatory Visit: Payer: Medicare Other

## 2016-11-01 LAB — LIPID PANEL
Cholesterol: 161 mg/dL (ref 0–200)
HDL: 55.8 mg/dL (ref >39.00)
NONHDL: 105
Total CHOL/HDL Ratio: 3
Triglycerides: 233 mg/dL — ABNORMAL HIGH (ref 0.0–149.0)
VLDL: 46.6 mg/dL — ABNORMAL HIGH (ref 0.0–40.0)

## 2016-11-01 LAB — LDL CHOLESTEROL, DIRECT: LDL DIRECT: 70 mg/dL

## 2016-11-01 NOTE — Assessment & Plan Note (Signed)
URI: Conservative treatment, if not better consider antibiotics. Add Astelin HTN: Well control Hyperlipidemia: Last triglycerides were elevated, he was taking Pravachol, niacin and red yeas rice. Currently only on Pravachol. Will come back fasting for labs. Anxiety depression and insomnia: Since the last visit, not taking Lexapro, reason?. Feeling well, continue with only Desyrel RTC ~ 06-2017, CPX

## 2016-11-03 ENCOUNTER — Other Ambulatory Visit: Payer: Self-pay | Admitting: Internal Medicine

## 2016-11-17 ENCOUNTER — Other Ambulatory Visit: Payer: Self-pay | Admitting: Internal Medicine

## 2016-12-07 DIAGNOSIS — H52223 Regular astigmatism, bilateral: Secondary | ICD-10-CM | POA: Diagnosis not present

## 2016-12-07 DIAGNOSIS — H43813 Vitreous degeneration, bilateral: Secondary | ICD-10-CM | POA: Diagnosis not present

## 2016-12-07 DIAGNOSIS — H25093 Other age-related incipient cataract, bilateral: Secondary | ICD-10-CM | POA: Diagnosis not present

## 2017-01-09 ENCOUNTER — Other Ambulatory Visit: Payer: Self-pay

## 2017-01-09 MED ORDER — TRAZODONE HCL 50 MG PO TABS: 50.0000 mg | ORAL_TABLET | Freq: Every evening | ORAL | 3 refills | Status: DC | PRN

## 2017-04-01 ENCOUNTER — Encounter: Payer: Self-pay | Admitting: Internal Medicine

## 2017-04-03 ENCOUNTER — Other Ambulatory Visit: Payer: Self-pay | Admitting: Internal Medicine

## 2017-04-03 DIAGNOSIS — M25551 Pain in right hip: Secondary | ICD-10-CM

## 2017-04-03 DIAGNOSIS — M25552 Pain in left hip: Principal | ICD-10-CM

## 2017-04-03 MED ORDER — TRAZODONE HCL 100 MG PO TABS: 200.0000 mg | ORAL_TABLET | Freq: Every day | ORAL | 0 refills | Status: DC

## 2017-04-03 NOTE — Telephone Encounter (Signed)
Spoke w/ Pt, informed that we would place referral to Dr. Alvan Dame and recommendations for trazodone 100mg , Rx sent, instructed to STOP benadryl as its damaging his liver. Informed for pain Dr. Larose Kells recommended hydrocodone 7.5mg , Pt declined stating that Tylenol OTC seems to be helping for the pain. Informed him to call if he changes his mind regarding hydrocodone. Pt verbalized understanding.

## 2017-04-03 NOTE — Telephone Encounter (Signed)
Call the patient regarding his message (see below): 1. Arrange a referral with Dr. Alvan Dame, DX hip pain 2. For insomnia: Okay to take trazodone 200 mg (4 tabs). To make it easier, send trazodone 100 mg: 2 tablets at night, #60, no refills. 3.STOP Benadryl, he is taking 8 tablets, that is TOO MUCH 4. Pain seems to be his main concern, recommend hydrocodone 7.5 mg twice a day. By controlling the pain he will sleep better. Prescription ready to be send. #30, no refills JP  ============  Hi Dr. Larose Kells: Per my records, I have had two bad hips for a few years. You prescribed Trazodone to help me sleep. The prescription says take 1-2 for sleep. I have been taking four. Also, I have been taking as many as 8 pills each night - a sleep aid from [facility] - Diphenhydramine 25mg . Recommended dosage - two. Every morning I wake up, both of my hips are sore from tossing & turning all night. I would like to see Dr. Elliot Cousin at [facility] to determine what can be done. The # of this practice is [phone]. A good friend had both of her hips done by Dr. Alvan Dame & she & her husband highly recommend him.  I look forward to your response. Warm Regards, Lanae Crumbly

## 2017-04-21 ENCOUNTER — Other Ambulatory Visit: Payer: Self-pay | Admitting: Internal Medicine

## 2017-04-27 DIAGNOSIS — M25551 Pain in right hip: Secondary | ICD-10-CM | POA: Diagnosis not present

## 2017-04-27 DIAGNOSIS — M160 Bilateral primary osteoarthritis of hip: Secondary | ICD-10-CM | POA: Diagnosis not present

## 2017-04-27 DIAGNOSIS — M25552 Pain in left hip: Secondary | ICD-10-CM | POA: Diagnosis not present

## 2017-04-28 ENCOUNTER — Encounter: Payer: Self-pay | Admitting: Internal Medicine

## 2017-04-28 MED ORDER — TRAZODONE HCL 100 MG PO TABS: 200.0000 mg | ORAL_TABLET | Freq: Every day | ORAL | 3 refills | Status: DC

## 2017-05-07 ENCOUNTER — Encounter: Payer: Self-pay | Admitting: Internal Medicine

## 2017-06-01 DIAGNOSIS — M7061 Trochanteric bursitis, right hip: Secondary | ICD-10-CM | POA: Diagnosis not present

## 2017-06-01 DIAGNOSIS — M160 Bilateral primary osteoarthritis of hip: Secondary | ICD-10-CM | POA: Diagnosis not present

## 2017-06-01 DIAGNOSIS — M7062 Trochanteric bursitis, left hip: Secondary | ICD-10-CM | POA: Diagnosis not present

## 2017-06-02 DIAGNOSIS — C61 Malignant neoplasm of prostate: Secondary | ICD-10-CM | POA: Diagnosis not present

## 2017-06-02 LAB — PSA: PSA: <0.015

## 2017-07-24 ENCOUNTER — Other Ambulatory Visit: Payer: Self-pay | Admitting: Internal Medicine

## 2017-07-25 ENCOUNTER — Ambulatory Visit (INDEPENDENT_AMBULATORY_CARE_PROVIDER_SITE_OTHER): Payer: Medicare Other | Admitting: Internal Medicine

## 2017-07-25 ENCOUNTER — Encounter: Payer: Self-pay | Admitting: Internal Medicine

## 2017-07-25 VITALS — BP 116/68 | HR 60 | Temp 98.1°F | Resp 14 | Ht 70.0 in | Wt 205.4 lb

## 2017-07-25 DIAGNOSIS — Z23 Encounter for immunization: Secondary | ICD-10-CM

## 2017-07-25 DIAGNOSIS — G4700 Insomnia, unspecified: Secondary | ICD-10-CM

## 2017-07-25 DIAGNOSIS — F419 Anxiety disorder, unspecified: Secondary | ICD-10-CM

## 2017-07-25 DIAGNOSIS — I10 Essential (primary) hypertension: Secondary | ICD-10-CM | POA: Diagnosis not present

## 2017-07-25 DIAGNOSIS — Z0000 Encounter for general adult medical examination without abnormal findings: Secondary | ICD-10-CM

## 2017-07-25 DIAGNOSIS — F329 Major depressive disorder, single episode, unspecified: Secondary | ICD-10-CM

## 2017-07-25 DIAGNOSIS — E785 Hyperlipidemia, unspecified: Secondary | ICD-10-CM

## 2017-07-25 LAB — COMPREHENSIVE METABOLIC PANEL
ALT: 28 U/L (ref 0–53)
AST: 18 U/L (ref 0–37)
Albumin: 3.9 g/dL (ref 3.5–5.2)
Alkaline Phosphatase: 43 U/L (ref 39–117)
BUN: 14 mg/dL (ref 6–23)
CHLORIDE: 104 meq/L (ref 96–112)
CO2: 29 meq/L (ref 19–32)
Calcium: 9.1 mg/dL (ref 8.4–10.5)
Creatinine, Ser: 0.83 mg/dL (ref 0.40–1.50)
GFR: 96.34 mL/min (ref >60.00)
GLUCOSE: 98 mg/dL (ref 70–99)
POTASSIUM: 3.5 meq/L (ref 3.5–5.1)
SODIUM: 141 meq/L (ref 135–145)
TOTAL PROTEIN: 6 g/dL (ref 6.0–8.3)
Total Bilirubin: 0.8 mg/dL (ref 0.2–1.2)

## 2017-07-25 LAB — LIPID PANEL
CHOL/HDL RATIO: 5
Cholesterol: 182 mg/dL (ref 0–200)
HDL: 36.2 mg/dL — ABNORMAL LOW (ref >39.00)
NonHDL: 145.49
Triglycerides: 277 mg/dL — ABNORMAL HIGH (ref 0.0–149.0)
VLDL: 55.4 mg/dL — AB (ref 0.0–40.0)

## 2017-07-25 LAB — LDL CHOLESTEROL, DIRECT: LDL DIRECT: 94 mg/dL

## 2017-07-25 LAB — TSH: TSH: 1.13 u[IU]/mL (ref 0.35–4.50)

## 2017-07-25 NOTE — Patient Instructions (Signed)
GO TO THE LAB : Get the blood work     GO TO THE FRONT DESK Schedule your next appointment for a  checkup in a month's  Consider Medicare wellness visit with one of our nurses

## 2017-07-25 NOTE — Progress Notes (Signed)
Subjective:    Patient ID: Larry Elliott, male    DOB: 1945/12/23, 71 y.o.   MRN: [id number]  DOS:  07/25/2017 Type of visit - description : Routine checkup Interval history: HTN: Good compliance of medication, no ambulatory BPs High cholesterol: On statins, good compliance and tolerance Prostate cancer: Sees urology regularly  DJD: Got local shots at the hips, they helped. Admits is not as physically active as he needs to be  Review of Systems No chest pain or difficulty breathing No nausea, vomiting, diarrhea. No anxiety or depression  Past Medical History:  Diagnosis Date  . Anxiety and depression 11/04/2013  . BCC (basal cell carcinoma), leg    Right calf, L nape of neck at hairline  . CAD (coronary artery disease)    CP, cath, non-obstructive  . Diverticulosis    colon  . Erectile dysfunction following radical prostatectomy   . Hyperlipidemia   . Hypertension   . Insomnia   . Melanoma ([place]) 2010   face, sees derm   . Prostate CA ([place]) 10/08   s/p robotic surgery  . Spermatocele    Left    Past Surgical History:  Procedure Laterality Date  . APPENDECTOMY    . HERNIA REPAIR    . KNEE ARTHROSCOPY Left    meniscal tear  . PROSTATECTOMY  07-2007    robotic,  for prostate cancer    . SHOULDER ARTHROSCOPY  07-09-2013   Dr Tamera Punt     Social History   Social History  . Marital status: Married    Spouse name: N/A  . Number of children: 2  . Years of education: N/A   Occupational History  . retired Government social research officer     Social History Main Topics  . Smoking status: Never Smoker  . Smokeless tobacco: Never Used  . Alcohol use Yes     Comment: socially   . Drug use: Unknown  . Sexual activity: Not on file   Other Topics Concern  . Not on file   Social History Narrative   Lives w/ wife    Original from [place]      Allergies as of 07/25/2017      Reactions   Aspirin    REACTION: swelling in face   Penicillins    REACTION: swelling in face      Medication List       Accurate as of 07/25/17 11:59 PM. Always use your most recent med list.          enalapril 5 MG tablet Commonly known as:  VASOTEC Take 1 tablet (5 mg total) by mouth daily.   FIBER LAXATIVE PO Take by mouth.   fish oil-omega-3 fatty acids 1000 MG capsule Take 2 g by mouth daily.   hydrochlorothiazide 25 MG tablet Commonly known as:  HYDRODIURIL Take 1 tablet (25 mg total) by mouth daily.   lovastatin 20 MG tablet Commonly known as:  MEVACOR Take 1 tablet (20 mg total) by mouth at bedtime.   multivitamin per tablet Take 1 tablet by mouth daily.   traZODone 100 MG tablet Commonly known as:  DESYREL Take 2 tablets (200 mg total) by mouth at bedtime.   Turmeric 500 MG Tabs Take 1,000 mg by mouth daily.   vitamin E 100 UNIT capsule Take 100 Units by mouth daily.          Objective:   Physical Exam BP 116/68 (BP Location: Left Arm, Patient Position: Sitting, Cuff Size: Small)  Pulse 60   Temp 98.1 F (36.7 C) (Oral)   Resp 14   Ht 5\' 10"  (1.778 m)   Wt 205 lb 6 oz (93.2 kg)   SpO2 97%   BMI 29.47 kg/m   General:   Well developed, well nourished . NAD.  Neck: No  thyromegaly  HEENT:  Normocephalic . Face symmetric, atraumatic Lungs:  CTA B Normal respiratory effort, no intercostal retractions, no accessory muscle use. Heart: RRR,  no murmur.  No pretibial edema bilaterally  Abdomen:  Not distended, soft, non-tender. No rebound or rigidity.   Skin: Exposed areas without rash. Not pale. Not jaundice Neurologic:  alert & oriented X3.  Speech normal, gait  unassisted, limited by lower extremities DJD Strength symmetric and appropriate for age.  Psych: Cognition and judgment appear intact.  Cooperative with normal attention span and concentration.  Behavior appropriate. No anxious or depressed appearing.    Assessment & Plan:   Assessment  HTN Hyperlipidemia Anxiety depression insomnia  dc clonazepam 8-16, rx  trazodone CAD, non-obstructive Prostate cancer-- 2008, robotic surgery, no incontinence, Dr Alinda Money ED past surgery  spermatocele, left Skin --Melanoma 2010, BCCs sees derm  PLAN: Preventive care reviewed HTN: Seems controlled on enalapril and HCTZ. Check a CMP High cholesterol: On Mevacor, check a FLP, TSH Anxiety depression insomnia: Controlled on Desyrel at night DJD: f/u [place] [facility], s/p local injections @ hip which helped, encourage to increase physical activity. RTC 8 months

## 2017-07-25 NOTE — Progress Notes (Signed)
Pre visit review using our clinic review tool, if applicable. No additional management support is needed unless otherwise documented below in the visit note. 

## 2017-07-25 NOTE — Assessment & Plan Note (Signed)
-  Td 2009 ;pneumonia shot 2009; prevnar-- 2015; zostavax 2011; shingrix discussed  . Flu shot today  -CCS: Colonoscopy 2003 ,   09-2010, 09-2014 >>> next 2020 -H/o prostate ca : [facility] urology regularly Diet and exercise discussed

## 2017-07-26 NOTE — Assessment & Plan Note (Addendum)
Preventive care reviewed HTN: Seems controlled on enalapril and HCTZ. Check a CMP High cholesterol: On Mevacor, check a FLP, TSH Anxiety depression insomnia: Controlled on Desyrel at night DJD: f/u [place] [facility], s/p local injections @ hip which helped, encourage to increase physical activity. RTC 8 months

## 2017-07-27 ENCOUNTER — Other Ambulatory Visit: Payer: Self-pay

## 2017-07-27 MED ORDER — LOVASTATIN 20 MG PO TABS: 20.0000 mg | ORAL_TABLET | Freq: Every day | ORAL | 2 refills | Status: DC

## 2017-08-23 ENCOUNTER — Other Ambulatory Visit: Payer: Self-pay | Admitting: Internal Medicine

## 2017-09-23 ENCOUNTER — Other Ambulatory Visit: Payer: Self-pay | Admitting: Internal Medicine

## 2018-02-21 ENCOUNTER — Other Ambulatory Visit: Payer: Self-pay | Admitting: Internal Medicine

## 2018-03-26 ENCOUNTER — Ambulatory Visit: Payer: No Typology Code available for payment source | Admitting: Internal Medicine

## 2018-03-28 ENCOUNTER — Ambulatory Visit (INDEPENDENT_AMBULATORY_CARE_PROVIDER_SITE_OTHER): Payer: Medicare Other | Admitting: Internal Medicine

## 2018-03-28 ENCOUNTER — Encounter: Payer: Self-pay | Admitting: Internal Medicine

## 2018-03-28 VITALS — BP 116/78 | HR 52 | Temp 97.5°F | Resp 16 | Ht 70.0 in | Wt 206.2 lb

## 2018-03-28 DIAGNOSIS — I10 Essential (primary) hypertension: Secondary | ICD-10-CM

## 2018-03-28 DIAGNOSIS — F419 Anxiety disorder, unspecified: Secondary | ICD-10-CM | POA: Diagnosis not present

## 2018-03-28 DIAGNOSIS — Z09 Encounter for follow-up examination after completed treatment for conditions other than malignant neoplasm: Secondary | ICD-10-CM

## 2018-03-28 DIAGNOSIS — F329 Major depressive disorder, single episode, unspecified: Secondary | ICD-10-CM | POA: Diagnosis not present

## 2018-03-28 DIAGNOSIS — G4700 Insomnia, unspecified: Secondary | ICD-10-CM | POA: Diagnosis not present

## 2018-03-28 LAB — BASIC METABOLIC PANEL
BUN: 16 mg/dL (ref 6–23)
CHLORIDE: 102 meq/L (ref 96–112)
CO2: 27 mEq/L (ref 19–32)
Calcium: 9.5 mg/dL (ref 8.4–10.5)
Creatinine, Ser: 0.8 mg/dL (ref 0.40–1.50)
GFR: 100.33 mL/min (ref >60.00)
Glucose, Bld: 99 mg/dL (ref 70–99)
POTASSIUM: 3.5 meq/L (ref 3.5–5.1)
SODIUM: 140 meq/L (ref 135–145)

## 2018-03-28 MED ORDER — CLORAZEPATE DIPOTASSIUM 7.5 MG PO TABS: 7.5000 mg | ORAL_TABLET | Freq: Two times a day (BID) | ORAL | 1 refills | Status: DC | PRN

## 2018-03-28 NOTE — Progress Notes (Signed)
Subjective:    Patient ID: Larry Elliott, male    DOB: [birth year], 72 y.o.   MRN: [id number]  DOS:  03/28/2018 Type of visit - description : f/u Interval history: Medications reviewed, good compliance. Labs reviewed, due for a BMP MSK issues: At baseline He takes trazodone regularly, still has some difficulties sleeping.   Review of Systems  Denies depression or anxiety per se.  Past Medical History:  Diagnosis Date  . Anxiety and depression 11/04/2013  . BCC (basal cell carcinoma), leg    Right calf, L nape of neck at hairline  . CAD (coronary artery disease)    CP, cath, non-obstructive  . Diverticulosis    colon  . Erectile dysfunction following radical prostatectomy   . Hyperlipidemia   . Hypertension   . Insomnia   . Melanoma ([place]) 2010   face, sees derm   . Prostate CA ([place]) 10/08   s/p robotic surgery  . Spermatocele    Left    Past Surgical History:  Procedure Laterality Date  . APPENDECTOMY    . HERNIA REPAIR    . KNEE ARTHROSCOPY Left    meniscal tear  . PROSTATECTOMY  07-2007    robotic,  for prostate cancer    . SHOULDER ARTHROSCOPY  07-09-2013   Dr Tamera Punt     Social History   Socioeconomic History  . Marital status: Married    Spouse name: Not on file  . Number of children: 2  . Years of education: Not on file  . Highest education level: Not on file  Occupational History  . Occupation: retired Government social research officer   Social Needs  . Financial resource strain: Not on file  . Food insecurity:    Worry: Not on file    Inability: Not on file  . Transportation needs:    Medical: Not on file    Non-medical: Not on file  Tobacco Use  . Smoking status: Never Smoker  . Smokeless tobacco: Never Used  Substance and Sexual Activity  . Alcohol use: Yes    Comment: socially   . Drug use: Not on file  . Sexual activity: Not on file  Lifestyle  . Physical activity:    Days per week: Not on file    Minutes per session: Not on file  . Stress:  Not on file  Relationships  . Social connections:    Talks on phone: Not on file    Gets together: Not on file    Attends religious service: Not on file    Active member of club or organization: Not on file    Attends meetings of clubs or organizations: Not on file    Relationship status: Not on file  . Intimate partner violence:    Fear of current or ex partner: Not on file    Emotionally abused: Not on file    Physically abused: Not on file    Forced sexual activity: Not on file  Other Topics Concern  . Not on file  Social History Narrative   Lives w/ wife    Original from [place] as of 03/28/2018      Reactions   Aspirin    REACTION: swelling in face   Penicillins    REACTION: swelling in face      Medication List        Accurate as of 03/28/18 11:07 AM. Always use your most recent med list.  enalapril 5 MG tablet Commonly known as:  VASOTEC Take 1 tablet (5 mg total) by mouth daily.   FIBER LAXATIVE PO Take by mouth.   fish oil-omega-3 fatty acids 1000 MG capsule Take 2 g by mouth daily.   hydrochlorothiazide 25 MG tablet Commonly known as:  HYDRODIURIL Take 1 tablet (25 mg total) by mouth daily.   lovastatin 20 MG tablet Commonly known as:  MEVACOR Take 1 tablet (20 mg total) by mouth at bedtime.   multivitamin per tablet Take 1 tablet by mouth daily.   traZODone 100 MG tablet Commonly known as:  DESYREL Take 2 tablets (200 mg total) by mouth at bedtime.   Turmeric 500 MG Tabs Take 1,000 mg by mouth daily.   vitamin E 100 UNIT capsule Take 100 Units by mouth daily.          Objective:   Physical Exam BP 116/78 (BP Location: Right Arm, Patient Position: Sitting, Cuff Size: Large)   Pulse (!) 52   Temp (!) 97.5 F (36.4 C) (Oral)   Resp 16   Ht 5\' 10"  (1.778 m)   Wt 206 lb 3.2 oz (93.5 kg)   SpO2 97%   BMI 29.59 kg/m   General:   Well developed, well nourished . NAD.  HEENT:  Normocephalic . Face symmetric,  atraumatic Lungs:  CTA B Normal respiratory effort, no intercostal retractions, no accessory muscle use. Heart: RRR,  no murmur.  No pretibial edema bilaterally  Skin: Exposed areas without rash. Not pale. Not jaundice Neurologic:  alert & oriented X3.  Speech normal, gait  unassisted, limited by lower extremities DJD Strength symmetric and appropriate for age.  Psych: Cognition and judgment appear intact.  Cooperative with normal attention span and concentration.  Behavior appropriate. No anxious or depressed      Assessment & Plan:   Assessment  HTN Hyperlipidemia Anxiety depression insomnia  dc clonazepam d/t not being effective,  8-16, rx trazodone CAD, non-obstructive Prostate cancer-- 2008, robotic surgery, no incontinence, Dr Alinda Money ED past surgery  spermatocele, left Skin --Melanoma 2010, BCCs sees derm  PLAN: HTN: Well-controlled, continue HCTZ, Vasotec.  Check a BMP CAD: Asymptomatic Anxiety, depression, insomnia: Currently on trazodone, still having issues with difficulty sleeping, tips for healthy sleep provided, add Tranxene.  Call if not better RTC 6 months.

## 2018-03-28 NOTE — Patient Instructions (Addendum)
GO TO THE LAB : Get the blood work     GO TO THE FRONT DESK Schedule your next appointment for a physical exam in 6 months  Continue trazodone Add Tranxene 1 tablet  at bedtime. Watch for excessive somnolence.   HEALTHY SLEEP Sleep hygiene: Basic rules for a good night's sleep  Sleep only as much as you need to feel rested and then get out of bed  Keep a regular sleep schedule  Avoid forcing sleep  Exercise regularly for at least 20 minutes, preferably 4 to 5 hours before bedtime  Avoid caffeinated beverages after lunch  Avoid alcohol near bedtime: no "night cap"  Avoid smoking, especially in the evening  Do not go to bed hungry  Adjust bedroom environment  Avoid prolonged use of light-emitting screens before bedtime   Deal with your worries before bedtime

## 2018-03-29 NOTE — Assessment & Plan Note (Signed)
HTN: Well-controlled, continue HCTZ, Vasotec.  Check a BMP CAD: Asymptomatic Anxiety, depression, insomnia: Currently on trazodone, still having issues with difficulty sleeping, tips for healthy sleep provided, add Tranxene.  Call if not better RTC 6 months.

## 2018-04-15 ENCOUNTER — Other Ambulatory Visit: Payer: Self-pay | Admitting: Internal Medicine

## 2018-05-09 ENCOUNTER — Telehealth: Payer: Self-pay | Admitting: Internal Medicine

## 2018-05-09 NOTE — Telephone Encounter (Signed)
NCCR printed- no discrepancies noted- sent for scanning.  

## 2018-05-09 NOTE — Telephone Encounter (Signed)
Refill sent, sig is 1-2 qhs pr, #60, no  RF

## 2018-05-09 NOTE — Telephone Encounter (Signed)
Pt is requesting refill on clorazepate.  Last OV:  03/28/2018 Last Fill: 03/28/2018 #30 and 1RF (Pt sig: 1 tab bid prn) UDS: None  Please advise.

## 2018-05-17 ENCOUNTER — Encounter: Payer: Self-pay | Admitting: Internal Medicine

## 2018-05-25 ENCOUNTER — Other Ambulatory Visit: Payer: Self-pay | Admitting: Internal Medicine

## 2018-06-01 DIAGNOSIS — Z8546 Personal history of malignant neoplasm of prostate: Secondary | ICD-10-CM | POA: Diagnosis not present

## 2018-06-05 ENCOUNTER — Encounter: Payer: Self-pay | Admitting: Internal Medicine

## 2018-06-18 ENCOUNTER — Other Ambulatory Visit: Payer: Self-pay | Admitting: Internal Medicine

## 2018-06-18 NOTE — Telephone Encounter (Signed)
Requesting: Clorazepate 7.5mg  (1-2tabs) HS prn Contract: none found UDS: none found Last OV: 03/28/18 Next Ov: 09/27/18 Last refill: 05/09/18, #60, 0RF Database: no discrepancies found  Please advise.

## 2018-06-18 NOTE — Telephone Encounter (Signed)
sent 

## 2018-06-26 ENCOUNTER — Encounter: Payer: Self-pay | Admitting: Internal Medicine

## 2018-07-30 ENCOUNTER — Encounter: Payer: Self-pay | Admitting: Internal Medicine

## 2018-07-30 DIAGNOSIS — M549 Dorsalgia, unspecified: Secondary | ICD-10-CM

## 2018-07-31 NOTE — Progress Notes (Signed)
[facility] [place], Larry Elliott [address] Phone: [phone] Subjective:    I Larry Elliott am serving as a Education administrator for Dr. Hulan Elliott.   I'm seeing this patient by the request  of:  Larry Branch, MD   CC: Back pain  [id number]  Larry Elliott is a 72 y.o. male coming in with complaint of back pain. Was setting up chairs and tables at church when he injured his back. States the pain is getting better.  Onset- 2 weeks  Location- right lower back  Duration-Daily since injury Character-aching sensation with a sharp pain with certain movements Aggravating factors- flexion  Therapies tried- Topical Severity-initially 8 out of 10 but slowly improving.  Now only 4 out of 10     Past Medical History:  Diagnosis Date  . Anxiety and depression 11/04/2013  . BCC (basal cell carcinoma), leg    Right calf, L nape of neck at hairline  . CAD (coronary artery disease)    CP, cath, non-obstructive  . Diverticulosis    Larry  . Erectile dysfunction following radical prostatectomy   . Hyperlipidemia   . Hypertension   . Insomnia   . Melanoma ([place]) 2010   face, sees derm   . Prostate CA ([place]) 10/08   s/p robotic surgery  . Spermatocele    Left   Past Surgical History:  Procedure Laterality Date  . APPENDECTOMY    . HERNIA REPAIR    . KNEE ARTHROSCOPY Left    meniscal tear  . PROSTATECTOMY  07-2007    robotic,  for prostate cancer    . SHOULDER ARTHROSCOPY  07-09-2013   Dr Tamera Punt    Social History   Socioeconomic History  . Marital status: Married    Spouse name: Not on file  . Number of children: 2  . Years of education: Not on file  . Highest education level: Not on file  Occupational History  . Occupation: retired Government social research officer   Social Needs  . Financial resource strain: Not on file  . Food insecurity:    Worry: Not on file    Inability: Not on file  . Transportation needs:    Medical: Not on file   Non-medical: Not on file  Tobacco Use  . Smoking status: Never Smoker  . Smokeless tobacco: Never Used  Substance and Sexual Activity  . Alcohol use: Yes    Comment: socially   . Drug use: Not on file  . Sexual activity: Not on file  Lifestyle  . Physical activity:    Days per week: Not on file    Minutes per session: Not on file  . Stress: Not on file  Relationships  . Social connections:    Talks on phone: Not on file    Gets together: Not on file    Attends religious service: Not on file    Active member of club or organization: Not on file    Attends meetings of clubs or organizations: Not on file    Relationship status: Not on file  Other Topics Concern  . Not on file  Social History Narrative   Lives w/ wife    Original from [place]   Allergies  Allergen Reactions  . Aspirin     REACTION: swelling in face  . Penicillins     REACTION: swelling in face   Family History  Problem Relation Age of Onset  . Coronary artery disease Father 23  dx age 84s  . Stroke Mother   . Hypertension Neg Hx   . Diabetes Neg Hx   . Larry cancer Neg Hx   . Prostate cancer Neg Hx      Current Outpatient Medications (Cardiovascular):  .  enalapril (VASOTEC) 5 MG tablet, Take 1 tablet (5 mg total) by mouth daily. .  hydrochlorothiazide (HYDRODIURIL) 25 MG tablet, Take 1 tablet (25 mg total) by mouth daily. Marland Kitchen  lovastatin (MEVACOR) 20 MG tablet, Take 1 tablet (20 mg total) by mouth at bedtime.     Current Outpatient Medications (Other):  Marland Kitchen  Calcium Polycarbophil (FIBER LAXATIVE PO), Take by mouth.   .  clorazepate (TRANXENE) 7.5 MG tablet, TAKE ONE TO TWO TABLETS BY MOUTH DAILY AT BEDTIME AS NEEDED FOR ANXIETY .  fish oil-omega-3 fatty acids 1000 MG capsule, Take 2 g by mouth daily.   .  multivitamin (THERAGRAN) per tablet, Take 1 tablet by mouth daily.   .  traZODone (DESYREL) 100 MG tablet, Take 2 tablets (200 mg total) by mouth at bedtime. .  Turmeric 500 MG TABS, Take  1,000 mg by mouth daily. .  vitamin E 100 UNIT capsule, Take 100 Units by mouth daily.   Marland Kitchen  gabapentin (NEURONTIN) 100 MG capsule, Take 2 capsules (200 mg total) by mouth at bedtime.    Past medical history, social, surgical and family history all reviewed in electronic medical record.  No pertanent information unless stated regarding to the chief complaint.   Review of Systems:  No headache, visual changes, nausea, vomiting, diarrhea, constipation, dizziness, abdominal pain, skin rash, fevers, chills, night sweats, weight loss, swollen lymph nodes, body aches, joint swelling,  chest pain, shortness of breath, mood changes.  Positive muscle aches  Objective  Blood pressure 104/70, pulse 76, height 5\' 10"  (1.778 m), weight 215 lb (97.5 kg), SpO2 95 %.    General: No apparent distress alert and oriented x3 mood and affect normal, dressed appropriately.  HEENT: Pupils equal, extraocular movements intact  Respiratory: Patient's speak in full sentences and does not appear short of breath  Cardiovascular: No lower extremity edema, non tender, no erythema  Skin: Warm dry intact with no signs of infection or rash on extremities or on axial skeleton.  Abdomen: Soft nontender obese Neuro: Cranial nerves II through XII are intact, neurovascularly intact in all extremities with 2+ DTRs and 2+ pulses.  Lymph: No lymphadenopathy of posterior or anterior cervical chain or axillae bilaterally.  Gait mild antalgic MSK:  Non tender with mild limited range of motion and good stability and symmetric strength and tone of shoulders, elbows, wrist, hip, knee and ankles bilaterally.  Arthritic changes of multiple joints  Back Exam:  Inspection: Loss of lordosis with poor core strength Motion: Flexion 2 deg, Extension 15 deg, Side Bending to 25 deg bilaterally,  Rotation to 20 deg bilaterally  SLR laying: Negative  XSLR laying: Negative  Palpable tenderness: Tender to palpation the paraspinal musculature  lumbar spine severely on the right side. FABER: negative. Sensory change: Gross sensation intact to all lumbar and sacral dermatomes.  Reflexes: 2+ at both patellar tendons, 2+ at achilles tendons, Babinski's downgoing.  Strength at foot  Plantar-flexion: 5/5 Dorsi-flexion: 5/5 Eversion: 5/5 Inversion: 5/5  Leg strength  Quad: 5/5 Hamstring: 5/5 Hip flexor: 5/5 Hip abductors: 4/5 but symmetric   [id number]; 15 additional minutes spent for Therapeutic exercises as stated in above notes.  This included exercises focusing on stretching, strengthening, with significant focus on eccentric aspects.  Long term goals include an improvement in range of motion, strength, endurance as well as avoiding reinjury. Patient's frequency would include in 1-2 times a day, 3-5 times a week for a duration of 6-12 weeks. Low back exercises that included:  Pelvic tilt/bracing instruction to focus on control of the pelvic girdle and lower abdominal muscles  Glute strengthening exercises, focusing on proper firing of the glutes without engaging the low back muscles Proper stretching techniques for maximum relief for the hamstrings, hip flexors, low back and some rotation where tolerated   Proper technique shown and discussed handout in great detail with ATC.  All questions were discussed and answered.       Impression and Recommendations:     This case required medical decision making of moderate complexity. The above documentation has been reviewed and is accurate and complete Lyndal Pulley, DO       Note: This dictation was prepared with Dragon dictation along with smaller phrase technology. Any transcriptional errors that result from this process are unintentional.

## 2018-08-03 ENCOUNTER — Encounter: Payer: Self-pay | Admitting: Family Medicine

## 2018-08-03 ENCOUNTER — Ambulatory Visit (INDEPENDENT_AMBULATORY_CARE_PROVIDER_SITE_OTHER): Payer: Medicare Other | Admitting: Family Medicine

## 2018-08-03 ENCOUNTER — Ambulatory Visit (INDEPENDENT_AMBULATORY_CARE_PROVIDER_SITE_OTHER)
Admission: RE | Admit: 2018-08-03 | Discharge: 2018-08-03 | Disposition: A | Discharge status: Home / Self Care | Payer: Medicare Other | Source: Ambulatory Visit | Attending: Family Medicine | Admitting: Family Medicine

## 2018-08-03 VITALS — BP 104/70 | HR 76 | Ht 70.0 in | Wt 215.0 lb

## 2018-08-03 DIAGNOSIS — M545 Low back pain, unspecified: Secondary | ICD-10-CM

## 2018-08-03 DIAGNOSIS — G8929 Other chronic pain: Secondary | ICD-10-CM | POA: Diagnosis not present

## 2018-08-03 DIAGNOSIS — Z23 Encounter for immunization: Secondary | ICD-10-CM | POA: Diagnosis not present

## 2018-08-03 DIAGNOSIS — M5441 Lumbago with sciatica, right side: Secondary | ICD-10-CM | POA: Diagnosis not present

## 2018-08-03 MED ORDER — METHYLPREDNISOLONE ACETATE 80 MG/ML IJ SUSP
80.0000 mg | Freq: Once | INTRAMUSCULAR | Status: AC
  Administered 2018-08-03: 80 mg via INTRAMUSCULAR

## 2018-08-03 MED ORDER — KETOROLAC TROMETHAMINE 60 MG/2ML IM SOLN
60.0000 mg | Freq: Once | INTRAMUSCULAR | Status: AC
  Administered 2018-08-03: 60 mg via INTRAMUSCULAR

## 2018-08-03 MED ORDER — GABAPENTIN 100 MG PO CAPS: 200.0000 mg | ORAL_CAPSULE | Freq: Every day | ORAL | 3 refills | Status: DC

## 2018-08-03 NOTE — Patient Instructions (Signed)
Good to see you  Xray  downstairs Ice 20 minutes 2 times daily. Usually after activity and before bed. Exercises 3 times a week.  2 injections today  To help with pain  See me again in 4 weeks

## 2018-08-03 NOTE — Assessment & Plan Note (Signed)
Mild radicular symptoms with significant tightness of the hamstrings.  Poor core strength and a acute accident 2 weeks ago.  Concern for potential herniated disc.  Past medical history significant for adenocarcinoma also x-rays ordered today.  Discussed the possibility of prednisone and 2 injections given instead.  Discussed icing regimen.  Home exercise given work with Product/process development scientist.  Following up again in 2 weeks for further evaluation

## 2018-08-21 ENCOUNTER — Telehealth: Payer: Self-pay | Admitting: Internal Medicine

## 2018-08-22 NOTE — Telephone Encounter (Signed)
Ok RF, UDS and contract on RTC by 09/2018

## 2018-08-22 NOTE — Telephone Encounter (Signed)
Pt is requesting refill on Tranxene.   Last OV: 03/28/2018 Last Fill: 06/18/2018 #60 and 1RF Pt sig: 1-2 tablets qhs prn UDS: None  Does Pt need UDS and contract at next visit for this medication?   NCCR printed- no discrepancies noted- sent for scanning.

## 2018-08-31 ENCOUNTER — Ambulatory Visit: Payer: Medicare Other | Admitting: Family Medicine

## 2018-09-27 ENCOUNTER — Encounter: Payer: Medicare Other | Admitting: Internal Medicine

## 2018-10-02 ENCOUNTER — Ambulatory Visit (INDEPENDENT_AMBULATORY_CARE_PROVIDER_SITE_OTHER): Payer: Medicare Other | Admitting: Internal Medicine

## 2018-10-02 ENCOUNTER — Encounter: Payer: Self-pay | Admitting: Internal Medicine

## 2018-10-02 VITALS — BP 118/78 | HR 79 | Temp 97.9°F | Resp 16 | Ht 70.0 in | Wt 210.5 lb

## 2018-10-02 DIAGNOSIS — F419 Anxiety disorder, unspecified: Secondary | ICD-10-CM

## 2018-10-02 DIAGNOSIS — M1991 Primary osteoarthritis, unspecified site: Secondary | ICD-10-CM | POA: Diagnosis not present

## 2018-10-02 DIAGNOSIS — F329 Major depressive disorder, single episode, unspecified: Secondary | ICD-10-CM | POA: Diagnosis not present

## 2018-10-02 DIAGNOSIS — E785 Hyperlipidemia, unspecified: Secondary | ICD-10-CM

## 2018-10-02 DIAGNOSIS — Z8546 Personal history of malignant neoplasm of prostate: Secondary | ICD-10-CM

## 2018-10-02 DIAGNOSIS — I10 Essential (primary) hypertension: Secondary | ICD-10-CM | POA: Diagnosis not present

## 2018-10-02 MED ORDER — ZOSTER VAC RECOMB ADJUVANTED 50 MCG/0.5ML IM SUSR: 0.5000 mL | Freq: Once | INTRAMUSCULAR | 1 refills | Status: AC

## 2018-10-02 MED ORDER — TETANUS-DIPHTH-ACELL PERTUSSIS 5-2.5-18.5 LF-MCG/0.5 IM SUSP: 0.5000 mL | Freq: Once | INTRAMUSCULAR | 0 refills | Status: AC

## 2018-10-02 NOTE — Patient Instructions (Addendum)
Please schedule Medicare Wellness with Glenard Haring.  GO TO THE LAB : Get the blood work     GO TO THE FRONT DESK Schedule your next appointment for a checkup in 6 to 8 months

## 2018-10-02 NOTE — Progress Notes (Signed)
Subjective:    Patient ID: Larry Elliott, male    DOB: Jul 17, 1946, 72 y.o.   MRN: [id number]  DOS:  10/02/2018 Type of visit - description : rov Back pain: Note from Dr. Tamala Julian reviewed High cholesterol: Good compliance with medication, due for labs HTN: Good compliance with medication, no ambulatory BPs History of BCC, has not seen Derm in a while. Insomnia: Only requiring trazodone at this time   Review of Systems Denies chest pain or difficulty breathing No nausea, vomiting, diarrhea No anxiety or depression  Past Medical History:  Diagnosis Date  . Anxiety and depression 11/04/2013  . BCC (basal cell carcinoma), leg    Right calf, L nape of neck at hairline  . CAD (coronary artery disease)    CP, cath, non-obstructive  . Diverticulosis    colon  . Erectile dysfunction following radical prostatectomy   . Hyperlipidemia   . Hypertension   . Insomnia   . Melanoma ([place]) 2010   face, sees derm   . Prostate CA ([place]) 10/08   s/p robotic surgery  . Spermatocele    Left    Past Surgical History:  Procedure Laterality Date  . APPENDECTOMY    . HERNIA REPAIR    . KNEE ARTHROSCOPY Left    meniscal tear  . PROSTATECTOMY  07-2007    robotic,  for prostate cancer    . SHOULDER ARTHROSCOPY  07-09-2013   Dr Tamera Punt     Social History   Socioeconomic History  . Marital status: Married    Spouse name: Not on file  . Number of children: 2  . Years of education: Not on file  . Highest education level: Not on file  Occupational History  . Occupation: retired Government social research officer   Social Needs  . Financial resource strain: Not on file  . Food insecurity:    Worry: Not on file    Inability: Not on file  . Transportation needs:    Medical: Not on file    Non-medical: Not on file  Tobacco Use  . Smoking status: Never Smoker  . Smokeless tobacco: Never Used  Substance and Sexual Activity  . Alcohol use: Yes    Comment: socially   . Drug use: Not on file  . Sexual  activity: Not on file  Lifestyle  . Physical activity:    Days per week: Not on file    Minutes per session: Not on file  . Stress: Not on file  Relationships  . Social connections:    Talks on phone: Not on file    Gets together: Not on file    Attends religious service: Not on file    Active member of club or organization: Not on file    Attends meetings of clubs or organizations: Not on file    Relationship status: Not on file  . Intimate partner violence:    Fear of current or ex partner: Not on file    Emotionally abused: Not on file    Physically abused: Not on file    Forced sexual activity: Not on file  Other Topics Concern  . Not on file  Social History Narrative   Lives w/ wife    Original from [place]      Allergies as of 10/02/2018      Reactions   Aspirin    REACTION: swelling in face   Penicillins    REACTION: swelling in face      Medication List  Accurate as of 10/02/18 11:59 PM. Always use your most recent med list.          enalapril 5 MG tablet Commonly known as:  VASOTEC Take 1 tablet (5 mg total) by mouth daily.   FIBER LAXATIVE PO Take by mouth.   fish oil-omega-3 fatty acids 1000 MG capsule Take 2 g by mouth daily.   hydrochlorothiazide 25 MG tablet Commonly known as:  HYDRODIURIL Take 1 tablet (25 mg total) by mouth daily.   lovastatin 20 MG tablet Commonly known as:  MEVACOR Take 1 tablet (20 mg total) by mouth at bedtime.   multivitamin per tablet Take 1 tablet by mouth daily.   Tdap 5-2.5-18.5 LF-MCG/0.5 injection Commonly known as:  BOOSTRIX Inject 0.5 mLs into the muscle once for 1 dose.   traZODone 100 MG tablet Commonly known as:  DESYREL Take 2 tablets (200 mg total) by mouth at bedtime.   Turmeric 500 MG Tabs Take 1,000 mg by mouth daily.   vitamin E 100 UNIT capsule Take 100 Units by mouth daily.   Zoster Vaccine Adjuvanted injection Commonly known as:  SHINGRIX Inject 0.5 mLs into the muscle once  for 1 dose.           Objective:   Physical Exam BP 118/78 (BP Location: Left Arm, Patient Position: Sitting, Cuff Size: Small)   Pulse 79   Temp 97.9 F (36.6 C) (Oral)   Resp 16   Ht 5\' 10"  (1.778 m)   Wt 210 lb 8 oz (95.5 kg)   SpO2 97%   BMI 30.20 kg/m  General: Well developed, NAD, BMI noted Neck: No  thyromegaly  HEENT:  Normocephalic . Face symmetric, atraumatic Lungs:  CTA B Normal respiratory effort, no intercostal retractions, no accessory muscle use. Heart: RRR,  no murmur.  Trace pretibial edema bilaterally  Abdomen:  Not distended, soft, non-tender. No rebound or rigidity.   Skin: Exposed areas without rash. Not pale. Not jaundice Neurologic:  alert & oriented X3.  Speech normal, gait appropriate for age and unassisted Strength symmetric and appropriate for age.  Psych: Cognition and judgment appear intact.  Cooperative with normal attention span and concentration.  Behavior appropriate. No anxious or depressed appearing.     Assessment & Plan:    Assessment  HTN Hyperlipidemia Anxiety depression insomnia  dc clonazepam d/t not being effective,  8-16, rx trazodone CAD, non-obstructive Prostate cancer-- 2008, robotic surgery, no incontinence, Dr Alinda Money: now f/u per PCP as of 05-2018 [facility] ED past surgery  spermatocele, left Skin --Melanoma 2010, BCCs sees derm  PLAN: HTN: Seems well controlled on Vasotec, HCTZ.  Check a CMP and CBC. Hyperlipidemia: On lovastatin, check a FLP.  Diet discussed, currently following just a regular diet Anxiety depression insomnia: Only requiring trazodone.  RF as needed. CAD: Asymptomatic. [facility]: Unable to exercise much, mostly due to hip pain, encouraged to think about water exercises. dermatology, encouraged to call them, due for a visit. Back pain: Was seen by Dr. Tamala Julian 08/03/2018, mild radicular symptoms, had XRs, got  methylprednisolone & IM ketorolac injections. Also rx gabapentin, better H/o  prostate cancer:  Per Dr. Alinda Money note 05/2018, now f/u per PCP, last PSA undetectable 8-20 19.  Next PSA  2020 Preventive care reviewed RTC 6-8 months

## 2018-10-02 NOTE — Assessment & Plan Note (Addendum)
-  Td 2009, rec booster @ the pharmacy ;pneumonia shot 2009; prevnar-- 2015; zostavax 2011; shingrix discussed, RX printed   . Flu shot today  -CCS: Colonoscopy 2003 ,   09-2010, 09-2014 >>> next 2020 -H/o prostate ca : Released from urology care 05-2018

## 2018-10-02 NOTE — Progress Notes (Signed)
Pre visit review using our clinic review tool, if applicable. No additional management support is needed unless otherwise documented below in the visit note. 

## 2018-10-03 LAB — CBC WITH DIFFERENTIAL/PLATELET
BASOS PCT: 1.5 % (ref 0.0–3.0)
Basophils Absolute: 0.1 10*3/uL (ref 0.0–0.1)
Eosinophils Absolute: 0.1 10*3/uL (ref 0.0–0.7)
Eosinophils Relative: 1.2 % (ref 0.0–5.0)
HCT: 46.2 % (ref 39.0–52.0)
Hemoglobin: 15.8 g/dL (ref 13.0–17.0)
Lymphocytes Relative: 18 % (ref 12.0–46.0)
Lymphs Abs: 1.5 10*3/uL (ref 0.7–4.0)
MCHC: 34.2 g/dL (ref 30.0–36.0)
MCV: 91.9 fl (ref 78.0–100.0)
Monocytes Absolute: 0.7 10*3/uL (ref 0.1–1.0)
Monocytes Relative: 8.3 % (ref 3.0–12.0)
NEUTROS ABS: 5.8 10*3/uL (ref 1.4–7.7)
Neutrophils Relative %: 71 % (ref 43.0–77.0)
Platelets: 288 10*3/uL (ref 150.0–400.0)
RBC: 5.03 Mil/uL (ref 4.22–5.81)
RDW: 14 % (ref 11.5–15.5)
WBC: 8.2 10*3/uL (ref 4.0–10.5)

## 2018-10-03 LAB — LDL CHOLESTEROL, DIRECT: Direct LDL: 135 mg/dL

## 2018-10-03 LAB — COMPREHENSIVE METABOLIC PANEL
ALT: 49 U/L (ref 0–53)
AST: 25 U/L (ref 0–37)
Albumin: 4.3 g/dL (ref 3.5–5.2)
Alkaline Phosphatase: 59 U/L (ref 39–117)
BUN: 14 mg/dL (ref 6–23)
CO2: 30 mEq/L (ref 19–32)
Calcium: 10.1 mg/dL (ref 8.4–10.5)
Chloride: 102 mEq/L (ref 96–112)
Creatinine, Ser: 1 mg/dL (ref 0.40–1.50)
GFR: 77.45 mL/min (ref >60.00)
Glucose, Bld: 91 mg/dL (ref 70–99)
POTASSIUM: 3.9 meq/L (ref 3.5–5.1)
Sodium: 142 mEq/L (ref 135–145)
Total Bilirubin: 1 mg/dL (ref 0.2–1.2)
Total Protein: 6.6 g/dL (ref 6.0–8.3)

## 2018-10-03 LAB — LIPID PANEL
Cholesterol: 229 mg/dL — ABNORMAL HIGH (ref 0–200)
HDL: 42.3 mg/dL (ref >39.00)
NonHDL: 186.71
Total CHOL/HDL Ratio: 5
Triglycerides: 365 mg/dL — ABNORMAL HIGH (ref 0.0–149.0)
VLDL: 73 mg/dL — ABNORMAL HIGH (ref 0.0–40.0)

## 2018-10-03 NOTE — Assessment & Plan Note (Addendum)
HTN: Seems well controlled on Vasotec, HCTZ.  Check a CMP and CBC. Hyperlipidemia: On lovastatin, check a FLP.  Diet discussed, currently following just a regular diet Anxiety depression insomnia: Only requiring trazodone.  RF as needed. CAD: Asymptomatic. [facility]: Unable to exercise much, mostly due to hip pain, encouraged to think about water exercises. dermatology, encouraged to call them, due for a visit. Back pain: Was seen by Dr. Tamala Julian 08/03/2018, mild radicular symptoms, had XRs, got  methylprednisolone & IM ketorolac injections. Also rx gabapentin, better H/o  prostate cancer: Per Dr. Alinda Money note 05/2018, now f/u per PCP, last PSA undetectable 8-20 19.  Next PSA  2020 Preventive care reviewed RTC 6-8 months

## 2018-10-08 MED ORDER — ATORVASTATIN CALCIUM 40 MG PO TABS: 40.0000 mg | ORAL_TABLET | Freq: Every day | ORAL | 3 refills | Status: DC

## 2018-10-08 NOTE — Addendum Note (Signed)
Addended byDamita Dunnings D on: 10/08/2018 08:08 AM   Modules accepted: Orders

## 2019-01-23 ENCOUNTER — Other Ambulatory Visit: Payer: Self-pay | Admitting: Internal Medicine

## 2019-01-24 ENCOUNTER — Other Ambulatory Visit: Payer: Self-pay

## 2019-01-24 ENCOUNTER — Other Ambulatory Visit (INDEPENDENT_AMBULATORY_CARE_PROVIDER_SITE_OTHER): Payer: Medicare Other

## 2019-01-24 DIAGNOSIS — E785 Hyperlipidemia, unspecified: Secondary | ICD-10-CM | POA: Diagnosis not present

## 2019-01-24 LAB — LIPID PANEL
Cholesterol: 160 mg/dL (ref 0–200)
HDL: 38.7 mg/dL — ABNORMAL LOW (ref >39.00)
NonHDL: 121.01
Total CHOL/HDL Ratio: 4
Triglycerides: 244 mg/dL — ABNORMAL HIGH (ref 0.0–149.0)
VLDL: 48.8 mg/dL — ABNORMAL HIGH (ref 0.0–40.0)

## 2019-01-24 LAB — LDL CHOLESTEROL, DIRECT: Direct LDL: 78 mg/dL

## 2019-01-24 LAB — AST: AST: 27 U/L (ref 0–37)

## 2019-01-24 LAB — ALT: ALT: 43 U/L (ref 0–53)

## 2019-02-11 ENCOUNTER — Other Ambulatory Visit: Payer: Self-pay | Admitting: Internal Medicine

## 2019-03-18 ENCOUNTER — Encounter: Payer: Self-pay | Admitting: Internal Medicine

## 2019-03-19 ENCOUNTER — Ambulatory Visit (HOSPITAL_BASED_OUTPATIENT_CLINIC_OR_DEPARTMENT_OTHER)
Admission: RE | Admit: 2019-03-19 | Discharge: 2019-03-19 | Disposition: A | Discharge status: Home / Self Care | Payer: Medicare Other | Source: Ambulatory Visit | Attending: Internal Medicine | Admitting: Internal Medicine

## 2019-03-19 ENCOUNTER — Other Ambulatory Visit: Payer: Self-pay

## 2019-03-19 ENCOUNTER — Ambulatory Visit (INDEPENDENT_AMBULATORY_CARE_PROVIDER_SITE_OTHER): Payer: Medicare Other | Admitting: Internal Medicine

## 2019-03-19 DIAGNOSIS — M7989 Other specified soft tissue disorders: Secondary | ICD-10-CM | POA: Insufficient documentation

## 2019-03-19 DIAGNOSIS — R600 Localized edema: Secondary | ICD-10-CM | POA: Diagnosis not present

## 2019-03-19 DIAGNOSIS — M79604 Pain in right leg: Secondary | ICD-10-CM | POA: Diagnosis not present

## 2019-03-19 DIAGNOSIS — M79605 Pain in left leg: Secondary | ICD-10-CM | POA: Diagnosis not present

## 2019-03-19 NOTE — Progress Notes (Signed)
Subjective:    Patient ID: Larry Elliott, male    DOB: 1946-04-20, 73 y.o.   MRN: [id number]  DOS:  03/19/2019 Type of visit - description: Virtual Visit via Video Note  I connected with@ on 03/19/19 at  2:40 PM EDT by a video enabled telemedicine application and verified that I am speaking with the correct person using two identifiers.   THIS ENCOUNTER IS A VIRTUAL VISIT DUE TO COVID-19 - PATIENT WAS NOT SEEN IN THE OFFICE. PATIENT HAS CONSENTED TO VIRTUAL VISIT / TELEMEDICINE VISIT   Location of patient: home  Location of provider: office  I discussed the limitations of evaluation and management by telemedicine and the availability of in person appointments. The patient expressed understanding and agreed to proceed.  History of Present Illness: Acute visit About 3 weeks ago he noticed severe pain at the posterior calf, near the Achilles tendon associated with swelling of the foot and calf up to the knee. Does not recall any injury. Overall the swelling and pain have decreased a little but they are not gone. Today, the left foot is a slightly swollen as well.  Review of Systems  Denies fever chills No chest pain no difficulty breathing No claudication per se or the rash of the lower extremities No cough He has been sedentary lately due to quarantine.  Past Medical History:  Diagnosis Date  . Anxiety and depression 11/04/2013  . BCC (basal cell carcinoma), leg    Right calf, L nape of neck at hairline  . CAD (coronary artery disease)    CP, cath, non-obstructive  . Diverticulosis    colon  . Erectile dysfunction following radical prostatectomy   . Hyperlipidemia   . Hypertension   . Insomnia   . Melanoma ([place]) 2010   face, sees derm   . Prostate CA ([place]) 10/08   s/p robotic surgery  . Spermatocele    Left    Past Surgical History:  Procedure Laterality Date  . APPENDECTOMY    . HERNIA REPAIR    . KNEE ARTHROSCOPY Left    meniscal tear  . PROSTATECTOMY   07-2007    robotic,  for prostate cancer    . SHOULDER ARTHROSCOPY  07-09-2013   Dr Tamera Punt     Social History   Socioeconomic History  . Marital status: Married    Spouse name: Not on file  . Number of children: 2  . Years of education: Not on file  . Highest education level: Not on file  Occupational History  . Occupation: retired Government social research officer   Social Needs  . Financial resource strain: Not on file  . Food insecurity:    Worry: Not on file    Inability: Not on file  . Transportation needs:    Medical: Not on file    Non-medical: Not on file  Tobacco Use  . Smoking status: Never Smoker  . Smokeless tobacco: Never Used  Substance and Sexual Activity  . Alcohol use: Yes    Comment: socially   . Drug use: Not on file  . Sexual activity: Not on file  Lifestyle  . Physical activity:    Days per week: Not on file    Minutes per session: Not on file  . Stress: Not on file  Relationships  . Social connections:    Talks on phone: Not on file    Gets together: Not on file    Attends religious service: Not on file    Active member  of club or organization: Not on file    Attends meetings of clubs or organizations: Not on file    Relationship status: Not on file  . Intimate partner violence:    Fear of current or ex partner: Not on file    Emotionally abused: Not on file    Physically abused: Not on file    Forced sexual activity: Not on file  Other Topics Concern  . Not on file  Social History Narrative   Lives w/ wife    Original from [place]      Allergies as of 03/19/2019      Reactions   Aspirin    REACTION: swelling in face   Penicillins    REACTION: swelling in face      Medication List       Accurate as of Mar 19, 2019  2:44 PM. If you have any questions, ask your nurse or doctor.        atorvastatin 40 MG tablet Commonly known as:  LIPITOR Take 1 tablet (40 mg total) by mouth at bedtime.   enalapril 5 MG tablet Commonly known as:  VASOTEC  Take 1 tablet (5 mg total) by mouth daily.   FIBER LAXATIVE PO Take by mouth.   fish oil-omega-3 fatty acids 1000 MG capsule Take 2 g by mouth daily.   hydrochlorothiazide 25 MG tablet Commonly known as:  HYDRODIURIL Take 1 tablet (25 mg total) by mouth daily.   lovastatin 20 MG tablet Commonly known as:  MEVACOR Take 1 tablet (20 mg total) by mouth at bedtime.   multivitamin per tablet Take 1 tablet by mouth daily.   traZODone 100 MG tablet Commonly known as:  DESYREL Take 2 tablets (200 mg total) by mouth at bedtime.   Turmeric 500 MG Tabs Take 1,000 mg by mouth daily.   vitamin E 100 UNIT capsule Take 100 Units by mouth daily.           Objective:   Physical Exam There were no vitals taken for this visit. This is a virtual video visit.  The patient is alert oriented x3, walking around his house without apparent impairment.  I asked him to focus the camera to the lower extremities, I was able to see that the right lower extremity. It is slightly swollen   Assessment     Assessment  HTN Hyperlipidemia Anxiety depression insomnia  dc clonazepam d/t not being effective,  8-16, rx trazodone CAD, non-obstructive Prostate cancer-- 2008, robotic surgery, no incontinence, Dr Alinda Money: now f/u per PCP as of 05-2018 [facility] ED past surgery  spermatocele, left Skin --Melanoma 2010, BCCs sees derm  PLAN: Right calf swelling and pain: As described above, minimal swelling at the left foot.  DVT must be ruled out. We arrange for ultrasound of bilateral lower extremities, patient will call the x-ray department and get it done today.  Further advised with results. Addendum: Ultrasound was negative, will schedule office visit face-to-face for this week   I discussed the assessment and treatment plan with the patient. The patient was provided an opportunity to ask questions and all were answered. The patient agreed with the plan and demonstrated an understanding of the instructions.    The patient was advised to call back or seek an in-person evaluation if the symptoms worsen or if the condition fails to improve as anticipated.

## 2019-03-20 ENCOUNTER — Telehealth: Payer: Self-pay | Admitting: Internal Medicine

## 2019-03-20 NOTE — Telephone Encounter (Signed)
thx

## 2019-03-20 NOTE — Telephone Encounter (Signed)
Larry Elliott requested for face to face this week called pt left detailed msg to call back to make appt

## 2019-03-20 NOTE — Assessment & Plan Note (Signed)
Right calf swelling and pain: As described above, minimal swelling at the left foot.  DVT must be ruled out. We arrange for ultrasound of bilateral lower extremities, patient will call the x-ray department and get it done today.  Further advised with results. Addendum: Ultrasound was negative, will schedule office visit face-to-face for this week

## 2019-03-21 ENCOUNTER — Other Ambulatory Visit: Payer: Self-pay

## 2019-03-21 ENCOUNTER — Ambulatory Visit (HOSPITAL_BASED_OUTPATIENT_CLINIC_OR_DEPARTMENT_OTHER)
Admission: RE | Admit: 2019-03-21 | Discharge: 2019-03-21 | Disposition: A | Discharge status: Home / Self Care | Payer: Medicare Other | Source: Ambulatory Visit | Attending: Internal Medicine | Admitting: Internal Medicine

## 2019-03-21 ENCOUNTER — Encounter (HOSPITAL_BASED_OUTPATIENT_CLINIC_OR_DEPARTMENT_OTHER): Payer: Self-pay

## 2019-03-21 ENCOUNTER — Ambulatory Visit (INDEPENDENT_AMBULATORY_CARE_PROVIDER_SITE_OTHER): Payer: Medicare Other | Admitting: Internal Medicine

## 2019-03-21 ENCOUNTER — Encounter: Payer: Self-pay | Admitting: Internal Medicine

## 2019-03-21 VITALS — BP 112/72 | HR 70 | Temp 97.5°F | Ht 70.0 in | Wt 226.0 lb

## 2019-03-21 DIAGNOSIS — M79661 Pain in right lower leg: Secondary | ICD-10-CM

## 2019-03-21 DIAGNOSIS — R600 Localized edema: Secondary | ICD-10-CM

## 2019-03-21 DIAGNOSIS — M79604 Pain in right leg: Secondary | ICD-10-CM | POA: Diagnosis not present

## 2019-03-21 LAB — CBC WITH DIFFERENTIAL/PLATELET
Basophils Absolute: 0 10*3/uL (ref 0.0–0.1)
Basophils Relative: 0.4 % (ref 0.0–3.0)
Eosinophils Absolute: 0.2 10*3/uL (ref 0.0–0.7)
Eosinophils Relative: 1.9 % (ref 0.0–5.0)
HCT: 40.2 % (ref 39.0–52.0)
Hemoglobin: 13.9 g/dL (ref 13.0–17.0)
Lymphocytes Relative: 13.2 % (ref 12.0–46.0)
Lymphs Abs: 1.2 10*3/uL (ref 0.7–4.0)
MCHC: 34.5 g/dL (ref 30.0–36.0)
MCV: 91.1 fl (ref 78.0–100.0)
Monocytes Absolute: 0.9 10*3/uL (ref 0.1–1.0)
Monocytes Relative: 9.6 % (ref 3.0–12.0)
Neutro Abs: 6.7 10*3/uL (ref 1.4–7.7)
Neutrophils Relative %: 74.9 % (ref 43.0–77.0)
Platelets: 259 10*3/uL (ref 150.0–400.0)
RBC: 4.41 Mil/uL (ref 4.22–5.81)
RDW: 13.9 % (ref 11.5–15.5)
WBC: 9 10*3/uL (ref 4.0–10.5)

## 2019-03-21 LAB — COMPREHENSIVE METABOLIC PANEL
ALT: 43 U/L (ref 0–53)
AST: 23 U/L (ref 0–37)
Albumin: 3.8 g/dL (ref 3.5–5.2)
Alkaline Phosphatase: 70 U/L (ref 39–117)
BUN: 13 mg/dL (ref 6–23)
CO2: 29 mEq/L (ref 19–32)
Calcium: 8.9 mg/dL (ref 8.4–10.5)
Chloride: 106 mEq/L (ref 96–112)
Creatinine, Ser: 0.81 mg/dL (ref 0.40–1.50)
GFR: 92.81 mL/min (ref >60.00)
Glucose, Bld: 89 mg/dL (ref 70–99)
Potassium: 4 mEq/L (ref 3.5–5.1)
Sodium: 143 mEq/L (ref 135–145)
Total Bilirubin: 0.7 mg/dL (ref 0.2–1.2)
Total Protein: 6 g/dL (ref 6.0–8.3)

## 2019-03-21 LAB — URIC ACID: Uric Acid, Serum: 5.5 mg/dL (ref 4.0–7.8)

## 2019-03-21 LAB — BRAIN NATRIURETIC PEPTIDE: Pro B Natriuretic peptide (BNP): 62 pg/mL (ref 0.0–100.0)

## 2019-03-21 NOTE — Progress Notes (Signed)
Subjective:    Patient ID: Larry Elliott, male    DOB: 06/03/1946, 73 y.o.   MRN: [id number]  DOS:  03/21/2019 Type of visit - description: acute See yesterday's visit. The patient has the right leg swelling from the knee down for 3 weeks. At some point his toes on the right side were somewhat red but that resolved. The calf itself has not been red or hot but somewhat painful particularly by the Achilles tendon area. No injury. He has mild swelling but mostly around the left ankle on the left leg.  After our tele-visit yesterday, ultrasound was ordered: No DVT.   Wt Readings from Last 3 Encounters:  03/21/19 226 lb (102.5 kg)  10/02/18 210 lb 8 oz (95.5 kg)  08/03/18 215 lb (97.5 kg)     Review of Systems Denies fever chills No chest pain no difficulty breathing No DOE.  No orthopnea No cough or sputum production No recent prolonged trips. I noted significant weight gain, he admits to eating more snacks during the quarantine, but to his knowledge he is not eating more salt.   Past Medical History:  Diagnosis Date  . Anxiety and depression 11/04/2013  . BCC (basal cell carcinoma), leg    Right calf, L nape of neck at hairline  . CAD (coronary artery disease)    CP, cath, non-obstructive  . Diverticulosis    colon  . Erectile dysfunction following radical prostatectomy   . Hyperlipidemia   . Hypertension   . Insomnia   . Melanoma ([place]) 2010   face, sees derm   . Prostate CA ([place]) 10/08   s/p robotic surgery  . Spermatocele    Left    Past Surgical History:  Procedure Laterality Date  . APPENDECTOMY    . HERNIA REPAIR    . KNEE ARTHROSCOPY Left    meniscal tear  . PROSTATECTOMY  07-2007    robotic,  for prostate cancer    . SHOULDER ARTHROSCOPY  07-09-2013   Dr Tamera Punt     Social History   Socioeconomic History  . Marital status: Married    Spouse name: Not on file  . Number of children: 2  . Years of education: Not on file  . Highest education  level: Not on file  Occupational History  . Occupation: retired Government social research officer   Social Needs  . Financial resource strain: Not on file  . Food insecurity:    Worry: Not on file    Inability: Not on file  . Transportation needs:    Medical: Not on file    Non-medical: Not on file  Tobacco Use  . Smoking status: Never Smoker  . Smokeless tobacco: Never Used  Substance and Sexual Activity  . Alcohol use: Yes    Comment: socially   . Drug use: Not on file  . Sexual activity: Not on file  Lifestyle  . Physical activity:    Days per week: Not on file    Minutes per session: Not on file  . Stress: Not on file  Relationships  . Social connections:    Talks on phone: Not on file    Gets together: Not on file    Attends religious service: Not on file    Active member of club or organization: Not on file    Attends meetings of clubs or organizations: Not on file    Relationship status: Not on file  . Intimate partner violence:    Fear of current or  ex partner: Not on file    Emotionally abused: Not on file    Physically abused: Not on file    Forced sexual activity: Not on file  Other Topics Concern  . Not on file  Social History Narrative   Lives w/ wife    Original from [place] as of 03/21/2019      Reactions   Aspirin    REACTION: swelling in face   Penicillins    REACTION: swelling in face      Medication List       Accurate as of Mar 21, 2019 11:59 PM. If you have any questions, ask your nurse or doctor.        atorvastatin 40 MG tablet Commonly known as:  LIPITOR Take 1 tablet (40 mg total) by mouth at bedtime.   enalapril 5 MG tablet Commonly known as:  VASOTEC Take 1 tablet (5 mg total) by mouth daily.   FIBER LAXATIVE PO Take by mouth.   fish oil-omega-3 fatty acids 1000 MG capsule Take 2 g by mouth daily.   hydrochlorothiazide 25 MG tablet Commonly known as:  HYDRODIURIL Take 1 tablet (25 mg total) by mouth daily.    multivitamin per tablet Take 1 tablet by mouth daily.   traZODone 100 MG tablet Commonly known as:  DESYREL Take 2 tablets (200 mg total) by mouth at bedtime.   Turmeric 500 MG Tabs Take 1,000 mg by mouth daily.   vitamin E 100 UNIT capsule Take 100 Units by mouth daily.           Objective:   Physical Exam BP 112/72 (BP Location: Right Arm, Patient Position: Sitting, Cuff Size: Normal)   Pulse 70   Temp (!) 97.5 F (36.4 C) (Oral)   Ht 5\' 10"  (1.778 m)   Wt 226 lb (102.5 kg)   SpO2 93%   BMI 32.43 kg/m  General:   Well developed, NAD, BMI noted.  HEENT:  Normocephalic . Face symmetric, atraumatic Neck: No JVD at 45 degrees Lungs:  CTA B Normal respiratory effort, no intercostal retractions, no accessory muscle use. Heart: RRR,  no murmur. Pedal femoral pulses. Lower extremities: Right calf is half inch larger in circunference compared to the left.  Pitting edema: ~ ++/+++ at the right pretibial area and ankle.  Less noticeable on the left leg. Abdomen:  Not distended, soft, non-tender. No rebound or rigidity. Inguinal area: No lymphadenopathies MSK: Ankles knees and feet: No synovitis Skin: Not pale. Not jaundice.  No evidence of cellulitis at the lower extremities Neurologic:  alert & oriented X3.  Speech normal, gait appropriate for age and unassisted.  Mild limping noted. Psych--  Cognition and judgment appear intact.  Cooperative with normal attention span and concentration.  Behavior appropriate. No anxious or depressed appearing.     Assessment     Assessment  HTN Hyperlipidemia Anxiety depression insomnia  dc clonazepam d/t not being effective,  8-16, rx trazodone CAD, non-obstructive Prostate cancer-- 2008, robotic surgery, no incontinence, Dr Alinda Money: now f/u per PCP as of 05-2018 [facility] ED past surgery  spermatocele, left Skin --Melanoma 2010, BCCs sees derm  PLAN: R>L lower extremity edema, right calf pain with ambulation: Symptoms  started 3 weeks ago, ultrasound yesterday negative for DVT on either leg. No history of injury. On exam no evidence of CHF, gout or cellulitis on clinical grounds. Suspect the lower extremity edema is multifactorial and related to weight gain, inactivity. Plan: CMP, CBC,  uric acid, BNP.  Also x-rays. Follow-up in 3 weeks, sooner if needed.  Consider Ortho referral.

## 2019-03-21 NOTE — Patient Instructions (Addendum)
Get the blood work     [facility] Schedule your next appointment   For a check up in 3 weeks   Leg elevation  Low salt diet  Try to loose some weight, weight daily  Call if worse, chest pain, leg redness or increased pain   Ortho referral ??

## 2019-03-22 NOTE — Assessment & Plan Note (Signed)
R>L lower extremity edema, right calf pain with ambulation: Symptoms started 3 weeks ago, ultrasound yesterday negative for DVT on either leg. No history of injury. On exam no evidence of CHF, gout or cellulitis on clinical grounds. Suspect the lower extremity edema is multifactorial and related to weight gain, inactivity. Plan: CMP, CBC, uric acid, BNP.  Also x-rays. Follow-up in 3 weeks, sooner if needed.  Consider Ortho referral.

## 2019-03-31 ENCOUNTER — Encounter: Payer: Self-pay | Admitting: Internal Medicine

## 2019-04-19 ENCOUNTER — Ambulatory Visit: Payer: Medicare Other | Admitting: Internal Medicine

## 2019-05-13 ENCOUNTER — Other Ambulatory Visit: Payer: Self-pay

## 2019-05-13 ENCOUNTER — Ambulatory Visit (INDEPENDENT_AMBULATORY_CARE_PROVIDER_SITE_OTHER): Payer: Medicare Other | Admitting: Internal Medicine

## 2019-05-13 ENCOUNTER — Encounter: Payer: Self-pay | Admitting: Internal Medicine

## 2019-05-13 VITALS — BP 122/76 | HR 75 | Temp 98.3°F | Resp 16 | Ht 70.0 in | Wt 219.2 lb

## 2019-05-13 DIAGNOSIS — K5900 Constipation, unspecified: Secondary | ICD-10-CM

## 2019-05-13 DIAGNOSIS — R600 Localized edema: Secondary | ICD-10-CM

## 2019-05-13 DIAGNOSIS — I10 Essential (primary) hypertension: Secondary | ICD-10-CM | POA: Diagnosis not present

## 2019-05-13 MED ORDER — SHINGRIX 50 MCG/0.5ML IM SUSR: 0.5000 mL | Freq: Once | INTRAMUSCULAR | 1 refills | Status: AC

## 2019-05-13 MED ORDER — TETANUS-DIPHTH-ACELL PERTUSSIS 5-2.5-18.5 LF-MCG/0.5 IM SUSP: 0.5000 mL | Freq: Once | INTRAMUSCULAR | 0 refills | Status: AC

## 2019-05-13 NOTE — Progress Notes (Signed)
Pre visit review using our clinic review tool, if applicable. No additional management support is needed unless otherwise documented below in the visit note. 

## 2019-05-13 NOTE — Progress Notes (Signed)
Subjective:    Patient ID: Larry Elliott, male    DOB: [birth year], 73 y.o.   MRN: [id number]  DOS:  05/13/2019 Type of visit - description: rov Since the last office visit he is doing well, lower extremity edema resolved Reports chronic problems with several attempts to have a BM every day resulting in  dry stools and not feeling  complete emptying after a BM.  He reports today that he is also having cramps at the lower abdomen sometimes. He is taking fiber supplements; does not take calcium   Review of Systems  Denies fever chills No weight loss No nausea, vomiting, diarrhea.  No blood in the stools.  Past Medical History:  Diagnosis Date  . Anxiety and depression 11/04/2013  . BCC (basal cell carcinoma), leg    Right calf, L nape of neck at hairline  . CAD (coronary artery disease)    CP, cath, non-obstructive  . Diverticulosis    colon  . Erectile dysfunction following radical prostatectomy   . Hyperlipidemia   . Hypertension   . Insomnia   . Melanoma ([place]) 2010   face, sees derm   . Prostate CA ([place]) 10/08   s/p robotic surgery  . Spermatocele    Left    Past Surgical History:  Procedure Laterality Date  . APPENDECTOMY    . HERNIA REPAIR    . KNEE ARTHROSCOPY Left    meniscal tear  . PROSTATECTOMY  07-2007    robotic,  for prostate cancer    . SHOULDER ARTHROSCOPY  07-09-2013   Dr Tamera Punt     Social History   Socioeconomic History  . Marital status: Married    Spouse name: Not on file  . Number of children: 2  . Years of education: Not on file  . Highest education level: Not on file  Occupational History  . Occupation: retired Government social research officer   Social Needs  . Financial resource strain: Not on file  . Food insecurity    Worry: Not on file    Inability: Not on file  . Transportation needs    Medical: Not on file    Non-medical: Not on file  Tobacco Use  . Smoking status: Never Smoker  . Smokeless tobacco: Never Used  Substance and Sexual  Activity  . Alcohol use: Yes    Comment: socially   . Drug use: Not on file  . Sexual activity: Not on file  Lifestyle  . Physical activity    Days per week: Not on file    Minutes per session: Not on file  . Stress: Not on file  Relationships  . Social Herbalist on phone: Not on file    Gets together: Not on file    Attends religious service: Not on file    Active member of club or organization: Not on file    Attends meetings of clubs or organizations: Not on file    Relationship status: Not on file  . Intimate partner violence    Fear of current or ex partner: Not on file    Emotionally abused: Not on file    Physically abused: Not on file    Forced sexual activity: Not on file  Other Topics Concern  . Not on file  Social History Narrative   Lives w/ wife    Original from [place]      Allergies as of 05/13/2019      Reactions   Aspirin  REACTION: swelling in face   Penicillins    REACTION: swelling in face      Medication List       Accurate as of May 13, 2019 11:59 PM. If you have any questions, ask your nurse or doctor.        atorvastatin 40 MG tablet Commonly known as: LIPITOR Take 1 tablet (40 mg total) by mouth at bedtime.   enalapril 5 MG tablet Commonly known as: VASOTEC Take 1 tablet (5 mg total) by mouth daily.   FIBER LAXATIVE PO Take by mouth.   fish oil-omega-3 fatty acids 1000 MG capsule Take 2 g by mouth daily.   hydrochlorothiazide 25 MG tablet Commonly known as: HYDRODIURIL Take 1 tablet (25 mg total) by mouth daily.   multivitamin per tablet Take 1 tablet by mouth daily.   Shingrix injection Generic drug: Zoster Vaccine Adjuvanted Inject 0.5 mLs into the muscle once for 1 dose. Started by: Kathlene November, MD   Tdap 5-2.5-18.5 LF-MCG/0.5 injection Commonly known as: BOOSTRIX Inject 0.5 mLs into the muscle once for 1 dose. Started by: Kathlene November, MD   traZODone 100 MG tablet Commonly known as: DESYREL Take 2  tablets (200 mg total) by mouth at bedtime.   Turmeric 500 MG Tabs Take 1,000 mg by mouth daily.   vitamin E 100 UNIT capsule Take 100 Units by mouth daily.           Objective:   Physical Exam BP 122/76 (BP Location: Left Arm, Patient Position: Sitting, Cuff Size: Small)   Pulse 75   Temp 98.3 F (36.8 C) (Oral)   Resp 16   Ht 5\' 10"  (1.778 m)   Wt 219 lb 4 oz (99.5 kg)   SpO2 96%   BMI 31.46 kg/m      General:   Well developed, NAD, BMI noted.  HEENT:  Normocephalic . Face symmetric, atraumatic Lungs:  CTA B Normal respiratory effort, no intercostal retractions, no accessory muscle use. Heart: RRR,  no murmur.  no pretibial edema bilaterally  Abdomen:  Not distended, soft, non-tender. No rebound or rigidity.   Skin: Not pale. Not jaundice Neurologic:  alert & oriented X3.  Speech normal, gait appropriate for age and unassisted Psych--  Cognition and judgment appear intact.  Cooperative with normal attention span and concentration.  Behavior appropriate. No anxious or depressed appearing.  Assessment    Assessment  HTN Hyperlipidemia Anxiety depression insomnia  dc clonazepam d/t not being effective,  8-16, rx trazodone CAD, non-obstructive Prostate cancer-- 2008, robotic surgery, no incontinence, Dr Alinda Money: now f/u per PCP as of 05-2018 [facility]D ED past surgery  spermatocele, left Skin --Melanoma 2010, BCCs sees derm  PLAN: R>L lower extremity edema, right calf pain with ambulation: Since the last visit, labs were okay, symptoms resolved. HTN: Controlled, continue Vasotec, HCTZ. Constipation: As described above, recommend to continue fiber supplements with plenty of fluids.  Also MiraLAX 3 times a week.  Call if not gradually better.  No red flag symptoms. Had 3 colonoscopies 2003, 2011 and 2015, next is due at the end of the year. H/o  Prostate cancer: We will check a PSA when he comes back Preventive care: Tdap and Shingrix printed.  Strongly encourage  a flu shot this season RTC CPX 09/2019

## 2019-05-13 NOTE — Patient Instructions (Signed)
  GO TO THE FRONT DESK Schedule your next appointment   for a physical exam December 2020  Take 1 or 2 capsules of fiber daily  with plenty of fluids  MiraLAX 17 g mixed with water : 3 times a week  Call if not better

## 2019-05-14 NOTE — Assessment & Plan Note (Signed)
R>L lower extremity edema, right calf pain with ambulation: Since the last visit, labs were okay, symptoms resolved. HTN: Controlled, continue Vasotec, HCTZ. Constipation: As described above, recommend to continue fiber supplements with plenty of fluids.  Also MiraLAX 3 times a week.  Call if not gradually better.  No red flag symptoms. Had 3 colonoscopies 2003, 2011 and 2015, next is due at the end of the year. H/o  Prostate cancer: We will check a PSA when he comes back Preventive care: Tdap and Shingrix printed.  Strongly encourage a flu shot this season RTC CPX 09/2019

## 2019-07-09 ENCOUNTER — Other Ambulatory Visit: Payer: Self-pay

## 2019-07-09 ENCOUNTER — Ambulatory Visit (INDEPENDENT_AMBULATORY_CARE_PROVIDER_SITE_OTHER): Payer: Medicare Other

## 2019-07-09 DIAGNOSIS — Z23 Encounter for immunization: Secondary | ICD-10-CM | POA: Diagnosis not present

## 2019-07-16 ENCOUNTER — Other Ambulatory Visit: Payer: Self-pay | Admitting: Internal Medicine

## 2019-07-17 ENCOUNTER — Other Ambulatory Visit: Payer: Self-pay | Admitting: Internal Medicine

## 2019-07-26 ENCOUNTER — Other Ambulatory Visit: Payer: Self-pay

## 2019-07-26 MED ORDER — ATORVASTATIN CALCIUM 40 MG PO TABS: 40.0000 mg | ORAL_TABLET | Freq: Every day | ORAL | 1 refills | Status: DC

## 2019-07-31 ENCOUNTER — Other Ambulatory Visit: Payer: Self-pay

## 2019-07-31 ENCOUNTER — Encounter: Payer: Self-pay | Admitting: Internal Medicine

## 2019-07-31 ENCOUNTER — Ambulatory Visit (INDEPENDENT_AMBULATORY_CARE_PROVIDER_SITE_OTHER): Payer: Medicare Other | Admitting: Internal Medicine

## 2019-07-31 VITALS — BP 114/66 | HR 77 | Temp 97.5°F | Resp 16 | Ht 70.0 in | Wt 230.2 lb

## 2019-07-31 DIAGNOSIS — M19042 Primary osteoarthritis, left hand: Secondary | ICD-10-CM | POA: Diagnosis not present

## 2019-07-31 NOTE — Progress Notes (Signed)
Subjective:    Patient ID: Larry Elliott, male    DOB: February 02, 1946, 73 y.o.   MRN: [id number]  DOS:  07/31/2019 Type of visit - description: Acute 4 weeks history of pain at the base of the left thumb.  The patient is left-handed. He denies any injury, swelling, redness, puffiness. The days he uses his hands a lot, he also have some throbbing and burning feeling even proximal from the left wrist.   Review of Systems  Denies neck pain No actual numbness or paresthesias No trigger finger phenomenon  Past Medical History:  Diagnosis Date  . Anxiety and depression 11/04/2013  . BCC (basal cell carcinoma), leg    Right calf, L nape of neck at hairline  . CAD (coronary artery disease)    CP, cath, non-obstructive  . Diverticulosis    colon  . Erectile dysfunction following radical prostatectomy   . Hyperlipidemia   . Hypertension   . Insomnia   . Melanoma ([place]) 2010   face, sees derm   . Prostate CA ([place]) 10/08   s/p robotic surgery  . Spermatocele    Left    Past Surgical History:  Procedure Laterality Date  . APPENDECTOMY    . HERNIA REPAIR    . KNEE ARTHROSCOPY Left    meniscal tear  . PROSTATECTOMY  07-2007    robotic,  for prostate cancer    . SHOULDER ARTHROSCOPY  07-09-2013   Dr Tamera Punt     Social History   Socioeconomic History  . Marital status: Married    Spouse name: Not on file  . Number of children: 2  . Years of education: Not on file  . Highest education level: Not on file  Occupational History  . Occupation: retired Government social research officer   Social Needs  . Financial resource strain: Not on file  . Food insecurity    Worry: Not on file    Inability: Not on file  . Transportation needs    Medical: Not on file    Non-medical: Not on file  Tobacco Use  . Smoking status: Never Smoker  . Smokeless tobacco: Never Used  Substance and Sexual Activity  . Alcohol use: Yes    Comment: socially   . Drug use: Not on file  . Sexual activity: Not on  file  Lifestyle  . Physical activity    Days per week: Not on file    Minutes per session: Not on file  . Stress: Not on file  Relationships  . Social Herbalist on phone: Not on file    Gets together: Not on file    Attends religious service: Not on file    Active member of club or organization: Not on file    Attends meetings of clubs or organizations: Not on file    Relationship status: Not on file  . Intimate partner violence    Fear of current or ex partner: Not on file    Emotionally abused: Not on file    Physically abused: Not on file    Forced sexual activity: Not on file  Other Topics Concern  . Not on file  Social History Narrative   Lives w/ wife    Original from [place]      Allergies as of 07/31/2019      Reactions   Aspirin    REACTION: swelling in face   Penicillins    REACTION: swelling in face      Medication  List       Accurate as of July 31, 2019 11:59 PM. If you have any questions, ask your nurse or doctor.        atorvastatin 40 MG tablet Commonly known as: LIPITOR Take 1 tablet (40 mg total) by mouth at bedtime.   enalapril 5 MG tablet Commonly known as: VASOTEC TAKE ONE TABLET BY MOUTH ONE TIME DAILY   FIBER LAXATIVE PO Take by mouth.   fish oil-omega-3 fatty acids 1000 MG capsule Take 2 g by mouth daily.   hydrochlorothiazide 25 MG tablet Commonly known as: HYDRODIURIL Take 1 tablet (25 mg total) by mouth daily.   multivitamin per tablet Take 1 tablet by mouth daily.   traZODone 100 MG tablet Commonly known as: DESYREL Take 2 tablets (200 mg total) by mouth at bedtime.   Turmeric 500 MG Tabs Take 1,000 mg by mouth daily.   vitamin E 100 UNIT capsule Take 100 Units by mouth daily.           Objective:   Physical Exam BP 114/66 (BP Location: Right Arm, Patient Position: Sitting, Cuff Size: Normal)   Pulse 77   Temp (!) 97.5 F (36.4 C) (Temporal)   Resp 16   Ht 5\' 10"  (1.778 m)   Wt 230 lb 4 oz  (104.4 kg)   SpO2 96%   BMI 33.04 kg/m  General:   Well developed, NAD, BMI noted. HEENT:  Normocephalic . Face symmetric, atraumatic MSK: Hands are symmetric, both have is consistent with DJD.  No synovitis on either hand. + TTP at the base of the first left metatarsal.  No redness or warmness. Neurologic:  alert & oriented X3.  Speech normal, gait appropriate for age and unassisted.  Motor exam normal. Psych--  Cognition and judgment appear intact.  Cooperative with normal attention span and concentration.  Behavior appropriate. No anxious or depressed appearing.      Assessment     Assessment  HTN Hyperlipidemia Anxiety depression insomnia  dc clonazepam d/t not being effective,  8-16, rx trazodone CAD, non-obstructive Prostate cancer-- 2008, robotic surgery, no incontinence, Dr Alinda Money: now f/u per PCP as of 05-2018 [facility] ED past surgery  spermatocele, left Skin --Melanoma 2010, BCCs sees derm  PLAN: DJD, wrist: Suspect symptoms related to DJD, likely will benefit from seeing orthopedics, local injection?Marland Kitchen No evidence of inflammatory arthritis Plan: Ortho referral, Voltaren gel topically, call if not better.

## 2019-07-31 NOTE — Progress Notes (Signed)
Pre visit review using our clinic review tool, if applicable. No additional management support is needed unless otherwise documented below in the visit note. 

## 2019-07-31 NOTE — Patient Instructions (Addendum)
Please schedule Medicare Wellness with Glenard Haring.   Will refer you to orthopedics  Warm compress  Get OTC Voltaren Gel, apply 3 or 4 times a day

## 2019-08-01 NOTE — Assessment & Plan Note (Signed)
DJD, wrist: Suspect symptoms related to DJD, likely will benefit from seeing orthopedics, local injection?Marland Kitchen No evidence of inflammatory arthritis Plan: Ortho referral, Voltaren gel topically, call if not better.

## 2019-08-08 DIAGNOSIS — M1812 Unilateral primary osteoarthritis of first carpometacarpal joint, left hand: Secondary | ICD-10-CM | POA: Diagnosis not present

## 2019-08-20 ENCOUNTER — Other Ambulatory Visit: Payer: Self-pay | Admitting: Internal Medicine

## 2019-09-10 DIAGNOSIS — M1812 Unilateral primary osteoarthritis of first carpometacarpal joint, left hand: Secondary | ICD-10-CM | POA: Diagnosis not present

## 2019-09-24 ENCOUNTER — Telehealth: Payer: Self-pay

## 2019-09-24 NOTE — Telephone Encounter (Signed)
Copied from [place] [id number]. Topic: General - Other >> Sep 24, 2019  3:51 PM Wynetta Emery, [place] C wrote: Reason for CRM: pt returning office call for prescreening

## 2019-09-25 ENCOUNTER — Ambulatory Visit (INDEPENDENT_AMBULATORY_CARE_PROVIDER_SITE_OTHER): Payer: Medicare Other | Admitting: Internal Medicine

## 2019-09-25 ENCOUNTER — Other Ambulatory Visit: Payer: Self-pay

## 2019-09-25 ENCOUNTER — Encounter: Payer: Self-pay | Admitting: Internal Medicine

## 2019-09-25 VITALS — BP 122/76 | HR 65 | Temp 97.2°F | Resp 18 | Ht 70.0 in | Wt 231.4 lb

## 2019-09-25 DIAGNOSIS — M79604 Pain in right leg: Secondary | ICD-10-CM

## 2019-09-25 DIAGNOSIS — M79605 Pain in left leg: Secondary | ICD-10-CM | POA: Diagnosis not present

## 2019-09-25 DIAGNOSIS — M48061 Spinal stenosis, lumbar region without neurogenic claudication: Secondary | ICD-10-CM

## 2019-09-25 NOTE — Progress Notes (Signed)
Pre visit review using our clinic review tool, if applicable. No additional management support is needed unless otherwise documented below in the visit note. 

## 2019-09-25 NOTE — Progress Notes (Addendum)
Subjective:    Patient ID: Larry Elliott, male    DOB: [birth year], 73 y.o.   MRN: [id number]  DOS:  09/25/2019 Type of visit - description: Acute For the last 2 to 3 months, if he sits for too long he has numbness at the lateral aspect of the right leg. Also, after going shopping and walking for 30 to 40 minutes he developed right leg pain, reports that the whole pain gets sore but sxs decrease with rest.   Review of Systems Denies fever chills No chest pain, no difficulty breathing No palpitations Has lower extremity edema, at baseline No actual back pain. No paresthesias at rest  Past Medical History:  Diagnosis Date  . Anxiety and depression 11/04/2013  . BCC (basal cell carcinoma), leg    Right calf, L nape of neck at hairline  . CAD (coronary artery disease)    CP, cath, non-obstructive  . Diverticulosis    colon  . Erectile dysfunction following radical prostatectomy   . Hyperlipidemia   . Hypertension   . Insomnia   . Melanoma ([place]) 2010   face, sees derm   . Prostate CA ([place]) 10/08   s/p robotic surgery  . Spermatocele    Left    Past Surgical History:  Procedure Laterality Date  . APPENDECTOMY    . HERNIA REPAIR    . KNEE ARTHROSCOPY Left    meniscal tear  . PROSTATECTOMY  07-2007    robotic,  for prostate cancer    . SHOULDER ARTHROSCOPY  07-09-2013   Dr Tamera Punt     Social History   Socioeconomic History  . Marital status: Married    Spouse name: Not on file  . Number of children: 2  . Years of education: Not on file  . Highest education level: Not on file  Occupational History  . Occupation: retired Government social research officer   Social Needs  . Financial resource strain: Not on file  . Food insecurity    Worry: Not on file    Inability: Not on file  . Transportation needs    Medical: Not on file    Non-medical: Not on file  Tobacco Use  . Smoking status: Never Smoker  . Smokeless tobacco: Never Used  Substance and Sexual Activity  . Alcohol  use: Yes    Comment: socially   . Drug use: Not on file  . Sexual activity: Not on file  Lifestyle  . Physical activity    Days per week: Not on file    Minutes per session: Not on file  . Stress: Not on file  Relationships  . Social Herbalist on phone: Not on file    Gets together: Not on file    Attends religious service: Not on file    Active member of club or organization: Not on file    Attends meetings of clubs or organizations: Not on file    Relationship status: Not on file  . Intimate partner violence    Fear of current or ex partner: Not on file    Emotionally abused: Not on file    Physically abused: Not on file    Forced sexual activity: Not on file  Other Topics Concern  . Not on file  Social History Narrative   Lives w/ wife    Original from [place]      Allergies as of 09/25/2019      Reactions   Aspirin    REACTION: swelling  in face   Penicillins    REACTION: swelling in face      Medication List       Accurate as of September 25, 2019  1:55 PM. If you have any questions, ask your nurse or doctor.        atorvastatin 40 MG tablet Commonly known as: LIPITOR Take 1 tablet (40 mg total) by mouth at bedtime.   enalapril 5 MG tablet Commonly known as: VASOTEC TAKE ONE TABLET BY MOUTH ONE TIME DAILY   FIBER LAXATIVE PO Take by mouth.   fish oil-omega-3 fatty acids 1000 MG capsule Take 2 g by mouth daily.   hydrochlorothiazide 25 MG tablet Commonly known as: HYDRODIURIL Take 1 tablet (25 mg total) by mouth daily.   multivitamin per tablet Take 1 tablet by mouth daily.   traZODone 100 MG tablet Commonly known as: DESYREL Take 2 tablets (200 mg total) by mouth at bedtime.   Turmeric 500 MG Tabs Take 1,000 mg by mouth daily.   vitamin E 100 UNIT capsule Take 100 Units by mouth daily.           Objective:   Physical Exam BP 122/76 (BP Location: Left Arm, Patient Position: Sitting, Cuff Size: Normal)   Pulse 65   Temp  (!) 97.2 F (36.2 C) (Temporal)   Resp 18   Ht 5\' 10"  (1.778 m)   Wt 231 lb 6 oz (105 kg)   SpO2 98%   BMI 33.20 kg/m  General:   Well developed, NAD, BMI noted.  HEENT:  Normocephalic . Face symmetric, atraumatic CV: + Edema, right leg, at baseline. Normal femoral and pedal pulses, feet are warm. MSK: No TTP at the lumbar spine Abdomen:  Not distended, soft, non-tender. No rebound or rigidity.   Skin: Not pale. Not jaundice Neurologic:  alert & oriented X3.  Speech normal, gait somewhat antalgic but unassisted Motor: Symmetric DTRs: Decreased right knee jerk, normal left knee jerk.  Absent bilateral ankle jerk. Psych--  Cognition and judgment appear intact.  Cooperative with normal attention span and concentration.  Behavior appropriate. No anxious or depressed appearing.     Assessment     Assessment  HTN Hyperlipidemia Anxiety depression insomnia  dc clonazepam d/t not being effective,  8-16, rx trazodone CAD, non-obstructive Prostate cancer-- 2008, robotic surgery, no incontinence, Dr Alinda Money: now f/u per PCP as of 05-2018 [facility] ED past surgery  spermatocele, left Skin --Melanoma 2010, BCCs sees derm LE edema R>L (-) US DVT 2020  PLAN: Leg pain: As described above, vascular exam is essentially normal, suspect symptoms related to spinal stenosis. Plan:  Refer to Ortho for further evaluation. Currently on Advil, recommend to switch to Tylenol Patient declined further pain medication prescriptions Addendum: For completeness we will check ABIs.  Patient aware.   This visit occurred during the SARS-CoV-2 public health emergency.  Safety protocols were in place, including screening questions prior to the visit, additional usage of staff PPE, and extensive cleaning of exam room while observing appropriate contact time as indicated for disinfecting solutions.

## 2019-09-26 NOTE — Assessment & Plan Note (Addendum)
As described above, vascular exam is essentially normal, suspect symptoms related to spinal stenosis. Plan:  Refer to Ortho for further evaluation. Currently on Advil, recommend to switch to Tylenol Patient declined further pain medication prescriptions Addendum: For completeness we will check ABIs.  Patient aware.

## 2019-09-27 ENCOUNTER — Telehealth (HOSPITAL_COMMUNITY): Payer: Self-pay | Admitting: *Deleted

## 2019-09-27 NOTE — Telephone Encounter (Signed)
09/27/19 left VM asking for return call to schedule.

## 2019-09-27 NOTE — Addendum Note (Signed)
Addended by: Damita Dunnings D on: 09/27/2019 11:43 AM   Modules accepted: Orders

## 2019-09-27 NOTE — Telephone Encounter (Signed)
Left VM asking for call back

## 2019-10-01 ENCOUNTER — Other Ambulatory Visit: Payer: Self-pay

## 2019-10-01 ENCOUNTER — Ambulatory Visit (HOSPITAL_COMMUNITY)
Admission: RE | Admit: 2019-10-01 | Discharge: 2019-10-01 | Disposition: A | Discharge status: Home / Self Care | Payer: Medicare Other | Source: Ambulatory Visit | Attending: Internal Medicine | Admitting: Internal Medicine

## 2019-10-01 DIAGNOSIS — M79605 Pain in left leg: Secondary | ICD-10-CM

## 2019-10-01 DIAGNOSIS — M79604 Pain in right leg: Secondary | ICD-10-CM | POA: Insufficient documentation

## 2019-10-04 ENCOUNTER — Encounter: Payer: Self-pay | Admitting: Internal Medicine

## 2019-10-04 ENCOUNTER — Other Ambulatory Visit: Payer: Self-pay | Admitting: Internal Medicine

## 2019-10-04 MED ORDER — HYDROCODONE-ACETAMINOPHEN 7.5-325 MG PO TABS: 1.0000 | ORAL_TABLET | Freq: Two times a day (BID) | ORAL | 0 refills | Status: DC | PRN

## 2019-10-15 ENCOUNTER — Other Ambulatory Visit: Payer: Self-pay | Admitting: Internal Medicine

## 2019-10-17 ENCOUNTER — Encounter: Payer: Self-pay | Admitting: Internal Medicine

## 2019-10-17 ENCOUNTER — Ambulatory Visit (INDEPENDENT_AMBULATORY_CARE_PROVIDER_SITE_OTHER): Payer: Medicare Other | Admitting: Internal Medicine

## 2019-10-17 ENCOUNTER — Other Ambulatory Visit: Payer: Self-pay

## 2019-10-17 VITALS — BP 132/79 | HR 91 | Temp 97.1°F | Resp 19 | Ht 70.0 in | Wt 228.0 lb

## 2019-10-17 DIAGNOSIS — E785 Hyperlipidemia, unspecified: Secondary | ICD-10-CM | POA: Diagnosis not present

## 2019-10-17 DIAGNOSIS — M79604 Pain in right leg: Secondary | ICD-10-CM

## 2019-10-17 DIAGNOSIS — Z8546 Personal history of malignant neoplasm of prostate: Secondary | ICD-10-CM | POA: Diagnosis not present

## 2019-10-17 DIAGNOSIS — I10 Essential (primary) hypertension: Secondary | ICD-10-CM | POA: Diagnosis not present

## 2019-10-17 LAB — LIPID PANEL
Cholesterol: 155 mg/dL (ref 0–200)
HDL: 39.3 mg/dL (ref >39.00)
NonHDL: 116.07
Total CHOL/HDL Ratio: 4
Triglycerides: 351 mg/dL — ABNORMAL HIGH (ref 0.0–149.0)
VLDL: 70.2 mg/dL — ABNORMAL HIGH (ref 0.0–40.0)

## 2019-10-17 LAB — LDL CHOLESTEROL, DIRECT: Direct LDL: 65 mg/dL

## 2019-10-17 LAB — BASIC METABOLIC PANEL
BUN: 13 mg/dL (ref 6–23)
CO2: 28 mEq/L (ref 19–32)
Calcium: 9.6 mg/dL (ref 8.4–10.5)
Chloride: 104 mEq/L (ref 96–112)
Creatinine, Ser: 0.84 mg/dL (ref 0.40–1.50)
GFR: 88.86 mL/min (ref >60.00)
Glucose, Bld: 98 mg/dL (ref 70–99)
Potassium: 4.1 mEq/L (ref 3.5–5.1)
Sodium: 142 mEq/L (ref 135–145)

## 2019-10-17 LAB — PSA: PSA: 0 ng/mL — ABNORMAL LOW (ref 0.10–4.00)

## 2019-10-17 NOTE — Assessment & Plan Note (Signed)
Immunizations: Up-to-date Next colonoscopy due, already received letter from GI and plans to proceed next year History of prostate cancer: Rechecking a PSA today

## 2019-10-17 NOTE — Progress Notes (Signed)
Subjective:    Patient ID: Larry Elliott, male    DOB: February 20, 1946, 73 y.o.   MRN: [id number]  DOS:  10/17/2019 Type of visit - description: Routine office visit Right leg pain: Recent ABIs reviewed, saw orthopedic surgery, planning on MRI.  Did not tolerate painkillers Anxiety depression insomnia: Controlled with Desyrel HTN: No ambulatory BPs, good med compliance High cholesterol: Due for FLP   Review of Systems  Denies fever chills No chest pain no difficulty breathing No lower extremity edema Continue with right leg pain, no bladder or bowel incontinence.  Mild paresthesias lower extremities.  Some right lower extremity weakness but no dropfoot  Past Medical History:  Diagnosis Date  . Anxiety and depression 11/04/2013  . BCC (basal cell carcinoma), leg    Right calf, L nape of neck at hairline  . CAD (coronary artery disease)    CP, cath, non-obstructive  . Diverticulosis    colon  . Erectile dysfunction following radical prostatectomy   . Hyperlipidemia   . Hypertension   . Insomnia   . Melanoma ([place]) 2010   face, sees derm   . Prostate CA ([place]) 10/08   s/p robotic surgery  . Spermatocele    Left    Past Surgical History:  Procedure Laterality Date  . APPENDECTOMY    . HERNIA REPAIR    . KNEE ARTHROSCOPY Left    meniscal tear  . PROSTATECTOMY  07-2007    robotic,  for prostate cancer    . SHOULDER ARTHROSCOPY  07-09-2013   Dr Tamera Punt     Social History   Socioeconomic History  . Marital status: Married    Spouse name: Not on file  . Number of children: 2  . Years of education: Not on file  . Highest education level: Not on file  Occupational History  . Occupation: retired Government social research officer   Tobacco Use  . Smoking status: Never Smoker  . Smokeless tobacco: Never Used  Substance and Sexual Activity  . Alcohol use: Yes    Comment: socially   . Drug use: Not on file  . Sexual activity: Not on file  Other Topics Concern  . Not on file  Social  History Narrative   Lives w/ wife    Original from [place]   Social Determinants of Health   Financial Resource Strain:   . Difficulty of Paying Living Expenses: Not on file  Food Insecurity:   . Worried About Charity fundraiser in the Last Year: Not on file  . Ran Out of Food in the Last Year: Not on file  Transportation Needs:   . Lack of Transportation (Medical): Not on file  . Lack of Transportation (Non-Medical): Not on file  Physical Activity:   . Days of Exercise per Week: Not on file  . Minutes of Exercise per Session: Not on file  Stress:   . Feeling of Stress : Not on file  Social Connections:   . Frequency of Communication with Friends and Family: Not on file  . Frequency of Social Gatherings with Friends and Family: Not on file  . Attends Religious Services: Not on file  . Active Member of Clubs or Organizations: Not on file  . Attends Archivist Meetings: Not on file  . Marital Status: Not on file  Intimate Partner Violence:   . Fear of Current or Ex-Partner: Not on file  . Emotionally Abused: Not on file  . Physically Abused: Not on file  .  Sexually Abused: Not on file      Allergies as of 10/17/2019      Reactions   Aspirin    REACTION: swelling in face   Penicillins    REACTION: swelling in face      Medication List       Accurate as of October 17, 2019  8:25 AM. If you have any questions, ask your nurse or doctor.        atorvastatin 40 MG tablet Commonly known as: LIPITOR Take 1 tablet (40 mg total) by mouth at bedtime.   enalapril 5 MG tablet Commonly known as: VASOTEC Take 1 tablet (5 mg total) by mouth daily.   FIBER LAXATIVE PO Take by mouth.   fish oil-omega-3 fatty acids 1000 MG capsule Take 2 g by mouth daily.   hydrochlorothiazide 25 MG tablet Commonly known as: HYDRODIURIL Take 1 tablet (25 mg total) by mouth daily.   HYDROcodone-acetaminophen 7.5-325 MG tablet Commonly known as: NORCO Take 1 tablet by mouth 2  (two) times daily as needed for moderate pain.   multivitamin per tablet Take 1 tablet by mouth daily.   traZODone 100 MG tablet Commonly known as: DESYREL Take 2 tablets (200 mg total) by mouth at bedtime.   Turmeric 500 MG Tabs Take 1,000 mg by mouth daily.   vitamin E 100 UNIT capsule Take 100 Units by mouth daily.           Objective:   Physical Exam BP 132/79 (BP Location: Left Arm, Patient Position: Sitting, Cuff Size: Normal)   Pulse 91   Temp (!) 97.1 F (36.2 C) (Temporal)   Resp 19   Ht 5\' 10"  (1.778 m)   Wt 228 lb (103.4 kg)   SpO2 96%   BMI 32.71 kg/m  General:   Well developed, NAD, BMI noted.  HEENT:  Normocephalic . Face symmetric, atraumatic Lungs:  CTA B Normal respiratory effort, no intercostal retractions, no accessory muscle use. Heart: RRR,  no murmur.  no pretibial edema bilaterally  Abdomen:  Not distended, soft, non-tender. No rebound or rigidity.   Skin: Not pale. Not jaundice Neurologic:  alert & oriented X3.  Speech normal, gait and transferring: Somewhat limited by back pain, has a difficult time getting onto the examining table Psych--  Cognition and judgment appear intact.  Cooperative with normal attention span and concentration.  Behavior appropriate. No anxious or depressed appearing.     Assessment      Assessment  HTN Hyperlipidemia Anxiety depression insomnia  dc clonazepam d/t not being effective,  8-16, rx trazodone CAD, non-obstructive Prostate cancer-- 2008, robotic surgery, no incontinence, Dr Alinda Money: now f/u per PCP as of 05-2018 [facility] ED past surgery  spermatocele, left Skin --Melanoma 2010, BCCs sees derm LE edema R>L (-) US DVT 2020  PLAN: HTN: Recommend ambulatory BPs, continue enalapril.  HCTZ.  Check a BMP Hyperlipidemia: On Lipitor, check FLP. Anxiety, depression, insomnia: Controlled on trazodone. Leg pain: ABIs Noncompressible arteries, toe brachial index normal bilaterally.  Seen by Ortho, x-rays  done, to have a MRI next week.  I prescribed hydrocodone, he did not like how he feel,  had insomnia, some SOB without wheezing, coughing or itching.  Doubt allergic reaction but will list in the allergy list as intolerance. Currently taking prescriptions from Ortho including a muscle relaxant and apparently A steroid. History of prostate cancer: Check a PSA Preventive care reviewed RTC 6 months   This visit occurred during the SARS-CoV-2 public health emergency.  Safety protocols were in place, including screening questions prior to the visit, additional usage of staff PPE, and extensive cleaning of exam room while observing appropriate contact time as indicated for disinfecting solutions.

## 2019-10-17 NOTE — Progress Notes (Signed)
Pre visit review using our clinic review tool, if applicable. No additional management support is needed unless otherwise documented below in the visit note. 

## 2019-10-17 NOTE — Patient Instructions (Addendum)
Please schedule Medicare Wellness with Larry Elliott.   GO TO THE LAB : Get the blood work     GO TO THE FRONT DESK Schedule your next appointment for a checkup in 6 months   Check your blood pressure twice a month BP GOAL is between 110/65 and  135/85. If it is consistently higher or lower, let me know

## 2019-10-19 NOTE — Assessment & Plan Note (Signed)
HTN: Recommend ambulatory BPs, continue enalapril.  HCTZ.  Check a BMP Hyperlipidemia: On Lipitor, check FLP. Anxiety, depression, insomnia: Controlled on trazodone. Leg pain: ABIs Noncompressible arteries, toe brachial index normal bilaterally.  Seen by Ortho, x-rays done, to have a MRI next week.  I prescribed hydrocodone, he did not like how he feel,  had insomnia, some SOB without wheezing, coughing or itching.  Doubt allergic reaction but will list in the allergy list as intolerance. Currently taking prescriptions from Ortho including a muscle relaxant and apparently A steroid. History of prostate cancer: Check a PSA Preventive care reviewed RTC 6 months

## 2019-11-19 ENCOUNTER — Other Ambulatory Visit: Payer: Self-pay | Admitting: Orthopedic Surgery

## 2019-11-26 NOTE — Pre-Procedure Instructions (Signed)
Your procedure is scheduled on Wednesday, February 10th, from 1:16 PM to 5:32 PM.  Report to [facility] Main Entrance "A" at 10:15 A.M., and check in at the Admitting office.  Call this number if you have problems the morning of surgery:  [phone]  Call [phone] if you have any questions prior to your surgery date Monday-Friday 8am-4pm    Remember:  Do not eat after midnight the night before your surgery.  You may drink clear liquids until 10:15 AM the morning of your surgery.    Clear liquids allowed are: Water, Non-Citrus Juices (without pulp), Carbonated Beverages, Clear Tea, Black Coffee Only, and Gatorade    Enhanced Recovery after Surgery for Orthopedics Enhanced Recovery after Surgery is a protocol used to improve the stress on your body and your recovery after surgery.  .  . The day of surgery (if you do NOT have diabetes):  o Drink ONE (1) Pre-Surgery Clear Ensure  by 10:15 AM the morning of surgery.   o This drink was given to you during your hospital  pre-op appointment visit. o Nothing else to drink after completing the  Pre-Surgery Clear Ensure.  Please, if able, drink it in one setting. DO NOT SIP.     Take these medicines the morning of surgery with A SIP OF WATER :  IF NEEDED: cyclobenzaprine (FLEXERIL)  methocarbamol (ROBAXIN)   7 days prior to surgery STOP taking any Aspirin (unless otherwise instructed by your surgeon), Aleve, Naproxen, Ibuprofen, Motrin, Advil, Goody's, BC's, all herbal medications, fish oil, and all vitamins.    The Morning of Surgery  Do not wear jewelry.  Do not wear lotions, powders, colognes, or deodorant  Men may shave face and neck.  Do not bring valuables to the hospital.  [facility] is not responsible for any belongings or valuables.  If you are a smoker, DO NOT Smoke 24 hours prior to surgery  If you wear a CPAP at night please bring your mask the morning of surgery   Remember that you must have someone to  transport you home after your surgery, and remain with you for 24 hours if you are discharged the same day.   Please bring cases for contacts, glasses, hearing aids, dentures or bridgework because it cannot be worn into surgery.    Leave your suitcase in the car.  After surgery it may be brought to your room.  For patients admitted to the hospital, discharge time will be determined by your treatment team.  Patients discharged the day of surgery will not be allowed to drive home.    Special instructions:   [place]- Preparing For Surgery  Before surgery, you can play an important role. Because skin is not sterile, your skin needs to be as free of germs as possible. You can reduce the number of germs on your skin by washing with CHG (chlorahexidine gluconate) Soap before surgery.  CHG is an antiseptic cleaner which kills germs and bonds with the skin to continue killing germs even after washing.    Oral Hygiene is also important to reduce your risk of infection.  Remember - BRUSH YOUR TEETH THE MORNING OF SURGERY WITH YOUR REGULAR TOOTHPASTE  Please do not use if you have an allergy to CHG or antibacterial soaps. If your skin becomes reddened/irritated stop using the CHG.  Do not shave (including legs and underarms) for at least 48 hours prior to first CHG shower. It is OK to shave your face.  Please  follow these instructions carefully.   1. Shower the NIGHT BEFORE SURGERY and the MORNING OF SURGERY with CHG Soap.   2. If you chose to wash your hair, wash your hair first as usual with your normal shampoo.  3. After you shampoo, rinse your hair and body thoroughly to remove the shampoo.  4. Use CHG as you would any other liquid soap. You can apply CHG directly to the skin and wash gently with a scrungie or a clean washcloth.   5. Apply the CHG Soap to your body ONLY FROM THE NECK DOWN.  Do not use on open wounds or open sores. Avoid contact with your eyes, ears, mouth and genitals  (private parts). Wash Face and genitals (private parts)  with your normal soap.   6. Wash thoroughly, paying special attention to the area where your surgery will be performed.  7. Thoroughly rinse your body with warm water from the neck down.  8. DO NOT shower/wash with your normal soap after using and rinsing off the CHG Soap.  9. Pat yourself dry with a CLEAN TOWEL.  10. Wear CLEAN PAJAMAS to bed the night before surgery, wear comfortable clothes the morning of surgery  11. Place CLEAN SHEETS on your bed the night of your first shower and DO NOT SLEEP WITH PETS.    Day of Surgery:  Please shower the morning of surgery with the CHG soap Do not apply any deodorants/lotions. Please wear clean clothes to the hospital/surgery center.   Remember to brush your teeth WITH YOUR REGULAR TOOTHPASTE.   Please read over the following fact sheets that you were given.

## 2019-11-27 ENCOUNTER — Other Ambulatory Visit: Payer: Self-pay

## 2019-11-27 ENCOUNTER — Encounter (HOSPITAL_COMMUNITY): Payer: Self-pay

## 2019-11-27 ENCOUNTER — Encounter (HOSPITAL_COMMUNITY)
Admission: RE | Admit: 2019-11-27 | Discharge: 2019-11-27 | Disposition: A | Discharge status: Home / Self Care | Payer: Medicare Other | Source: Ambulatory Visit | Attending: Orthopedic Surgery | Admitting: Orthopedic Surgery

## 2019-11-27 DIAGNOSIS — I10 Essential (primary) hypertension: Secondary | ICD-10-CM | POA: Insufficient documentation

## 2019-11-27 DIAGNOSIS — Z01818 Encounter for other preprocedural examination: Secondary | ICD-10-CM | POA: Diagnosis not present

## 2019-11-27 HISTORY — DX: Unspecified osteoarthritis, unspecified site: M19.90

## 2019-11-27 LAB — CBC WITH DIFFERENTIAL/PLATELET
Abs Immature Granulocytes: 0.04 10*3/uL (ref 0.00–0.07)
Basophils Absolute: 0.1 10*3/uL (ref 0.0–0.1)
Basophils Relative: 1 %
Eosinophils Absolute: 0.1 10*3/uL (ref 0.0–0.5)
Eosinophils Relative: 1 %
HCT: 47.1 % (ref 39.0–52.0)
Hemoglobin: 15.6 g/dL (ref 13.0–17.0)
Immature Granulocytes: 1 %
Lymphocytes Relative: 13 %
Lymphs Abs: 1 10*3/uL (ref 0.7–4.0)
MCH: 30.6 pg (ref 26.0–34.0)
MCHC: 33.1 g/dL (ref 30.0–36.0)
MCV: 92.5 fL (ref 80.0–100.0)
Monocytes Absolute: 0.9 10*3/uL (ref 0.1–1.0)
Monocytes Relative: 12 %
Neutro Abs: 5.5 10*3/uL (ref 1.7–7.7)
Neutrophils Relative %: 72 %
Platelets: 279 10*3/uL (ref 150–400)
RBC: 5.09 MIL/uL (ref 4.22–5.81)
RDW: 13.7 % (ref 11.5–15.5)
WBC: 7.6 10*3/uL (ref 4.0–10.5)
nRBC: 0 % (ref 0.0–0.2)

## 2019-11-27 LAB — COMPREHENSIVE METABOLIC PANEL
ALT: 51 U/L — ABNORMAL HIGH (ref 0–44)
AST: 34 U/L (ref 15–41)
Albumin: 3.8 g/dL (ref 3.5–5.0)
Alkaline Phosphatase: 70 U/L (ref 38–126)
Anion gap: 9 (ref 5–15)
BUN: 12 mg/dL (ref 8–23)
CO2: 26 mmol/L (ref 22–32)
Calcium: 9.2 mg/dL (ref 8.9–10.3)
Chloride: 104 mmol/L (ref 98–111)
Creatinine, Ser: 0.87 mg/dL (ref 0.61–1.24)
GFR calc Af Amer: >60 mL/min (ref >60)
GFR calc non Af Amer: >60 mL/min (ref >60)
Glucose, Bld: 106 mg/dL — ABNORMAL HIGH (ref 70–99)
Potassium: 3.4 mmol/L — ABNORMAL LOW (ref 3.5–5.1)
Sodium: 139 mmol/L (ref 135–145)
Total Bilirubin: 0.8 mg/dL (ref 0.3–1.2)
Total Protein: 6.5 g/dL (ref 6.5–8.1)

## 2019-11-27 LAB — URINALYSIS, ROUTINE W REFLEX MICROSCOPIC
Bilirubin Urine: NEGATIVE
Glucose, UA: NEGATIVE mg/dL
Hgb urine dipstick: NEGATIVE
Ketones, ur: NEGATIVE mg/dL
Leukocytes,Ua: NEGATIVE
Nitrite: NEGATIVE
Protein, ur: NEGATIVE mg/dL
Specific Gravity, Urine: 1.02 (ref 1.005–1.030)
pH: 5 (ref 5.0–8.0)

## 2019-11-27 LAB — SURGICAL PCR SCREEN
MRSA, PCR: NEGATIVE
Staphylococcus aureus: NEGATIVE

## 2019-11-27 LAB — TYPE AND SCREEN
ABO/RH(D): O POS
Antibody Screen: NEGATIVE

## 2019-11-27 LAB — PROTIME-INR
INR: 0.9 (ref 0.8–1.2)
Prothrombin Time: 12.2 seconds (ref 11.4–15.2)

## 2019-11-27 LAB — ABO/RH: ABO/RH(D): O POS

## 2019-11-27 LAB — APTT: aPTT: 31 seconds (ref 24–36)

## 2019-11-27 NOTE — Progress Notes (Signed)
PCP - Dr. Kathlene November Cardiologist - Denies GI: Dr. Laurence Spates  PPM/ICD - Denies  Chest x-ray - 06/14/2010 EKG - 11/27/2019 Stress Test - Denies ECHO - Denies Cardiac Cath - 06/15/2010  Sleep Study - Denies  Patient denies being diabetic.  Blood Thinner Instructions: N/A Aspirin Instructions: N/A  ERAS Protcol - Yes  PRE-SURGERY Ensure  COVID TEST- Scheduled 11/30/2019   Coronavirus Screening  Have you experienced the following symptoms:  Cough yes/no: No Fever (>100.66F)  yes/no: No Runny nose yes/no: No Sore throat yes/no: No Difficulty breathing/shortness of breath  yes/no: No  Have you or a family member traveled in the last 14 days and where? yes/no: No   If the patient indicates "YES" to the above questions, their PAT will be rescheduled to limit the exposure to others and, the surgeon will be notified. THE PATIENT WILL NEED TO BE ASYMPTOMATIC FOR 14 DAYS.   If the patient is not experiencing any of these symptoms, the PAT nurse will instruct them to NOT bring anyone with them to their appointment since they may have these symptoms or traveled as well.   Please remind your patients and families that hospital visitation restrictions are in effect and the importance of the restrictions.     Anesthesia review:   Patient denies shortness of breath, fever, cough and chest pain at PAT appointment   All instructions explained to the patient, with a verbal understanding of the material. Patient agrees to go over the instructions while at home for a better understanding. Patient also instructed to self quarantine after being tested for COVID-19. The opportunity to ask questions was provided.

## 2019-11-30 ENCOUNTER — Other Ambulatory Visit (HOSPITAL_COMMUNITY)
Admission: RE | Admit: 2019-11-30 | Discharge: 2019-11-30 | Disposition: A | Discharge status: Home / Self Care | Payer: Medicare Other | Source: Ambulatory Visit | Attending: Orthopedic Surgery | Admitting: Orthopedic Surgery

## 2019-11-30 DIAGNOSIS — Z20822 Contact with and (suspected) exposure to covid-19: Secondary | ICD-10-CM | POA: Diagnosis not present

## 2019-11-30 DIAGNOSIS — Z01812 Encounter for preprocedural laboratory examination: Secondary | ICD-10-CM | POA: Insufficient documentation

## 2019-11-30 LAB — SARS CORONAVIRUS 2 (TAT 6-24 HRS): SARS Coronavirus 2: NEGATIVE

## 2019-12-03 MED ORDER — VANCOMYCIN HCL 1500 MG/300ML IV SOLN
1500.0000 mg | INTRAVENOUS | Status: AC
  Administered 2019-12-04: 1500 mg via INTRAVENOUS
  Filled 2019-12-03 (×2): qty 300

## 2019-12-04 ENCOUNTER — Ambulatory Visit (HOSPITAL_COMMUNITY)
Admission: RE | Disposition: A | Discharge status: Home / Self Care | Payer: Self-pay | Source: Home / Self Care | Attending: Orthopedic Surgery

## 2019-12-04 ENCOUNTER — Encounter (HOSPITAL_COMMUNITY): Payer: Self-pay | Admitting: Orthopedic Surgery

## 2019-12-04 ENCOUNTER — Observation Stay (HOSPITAL_COMMUNITY)
Admission: RE | Admit: 2019-12-04 | Discharge: 2019-12-05 | Disposition: A | Discharge status: Home / Self Care | Payer: Medicare Other | Attending: Orthopedic Surgery | Admitting: Orthopedic Surgery

## 2019-12-04 ENCOUNTER — Inpatient Hospital Stay (HOSPITAL_COMMUNITY): Payer: Medicare Other

## 2019-12-04 ENCOUNTER — Inpatient Hospital Stay (HOSPITAL_COMMUNITY): Payer: Medicare Other | Admitting: Vascular Surgery

## 2019-12-04 ENCOUNTER — Inpatient Hospital Stay (HOSPITAL_COMMUNITY): Payer: Medicare Other | Admitting: Anesthesiology

## 2019-12-04 ENCOUNTER — Other Ambulatory Visit: Payer: Self-pay

## 2019-12-04 DIAGNOSIS — I2510 Atherosclerotic heart disease of native coronary artery without angina pectoris: Secondary | ICD-10-CM | POA: Insufficient documentation

## 2019-12-04 DIAGNOSIS — G4700 Insomnia, unspecified: Secondary | ICD-10-CM | POA: Insufficient documentation

## 2019-12-04 DIAGNOSIS — M48061 Spinal stenosis, lumbar region without neurogenic claudication: Secondary | ICD-10-CM | POA: Diagnosis not present

## 2019-12-04 DIAGNOSIS — Z8546 Personal history of malignant neoplasm of prostate: Secondary | ICD-10-CM | POA: Diagnosis not present

## 2019-12-04 DIAGNOSIS — M4316 Spondylolisthesis, lumbar region: Secondary | ICD-10-CM | POA: Diagnosis not present

## 2019-12-04 DIAGNOSIS — Z79899 Other long term (current) drug therapy: Secondary | ICD-10-CM | POA: Diagnosis not present

## 2019-12-04 DIAGNOSIS — M5410 Radiculopathy, site unspecified: Secondary | ICD-10-CM | POA: Diagnosis present

## 2019-12-04 DIAGNOSIS — Z85820 Personal history of malignant melanoma of skin: Secondary | ICD-10-CM | POA: Insufficient documentation

## 2019-12-04 DIAGNOSIS — I10 Essential (primary) hypertension: Secondary | ICD-10-CM | POA: Diagnosis not present

## 2019-12-04 DIAGNOSIS — F419 Anxiety disorder, unspecified: Secondary | ICD-10-CM | POA: Insufficient documentation

## 2019-12-04 DIAGNOSIS — M5416 Radiculopathy, lumbar region: Principal | ICD-10-CM | POA: Insufficient documentation

## 2019-12-04 DIAGNOSIS — E785 Hyperlipidemia, unspecified: Secondary | ICD-10-CM | POA: Diagnosis not present

## 2019-12-04 DIAGNOSIS — Z85828 Personal history of other malignant neoplasm of skin: Secondary | ICD-10-CM | POA: Insufficient documentation

## 2019-12-04 DIAGNOSIS — Z419 Encounter for procedure for purposes other than remedying health state, unspecified: Secondary | ICD-10-CM

## 2019-12-04 DIAGNOSIS — M4326 Fusion of spine, lumbar region: Secondary | ICD-10-CM | POA: Diagnosis not present

## 2019-12-04 DIAGNOSIS — Z981 Arthrodesis status: Secondary | ICD-10-CM | POA: Diagnosis not present

## 2019-12-04 HISTORY — PX: TRANSFORAMINAL LUMBAR INTERBODY FUSION (TLIF) WITH PEDICLE SCREW FIXATION 1 LEVEL: SHX6141

## 2019-12-04 SURGERY — TRANSFORAMINAL LUMBAR INTERBODY FUSION (TLIF) WITH PEDICLE SCREW FIXATION 1 LEVEL
Anesthesia: General | Site: Spine Lumbar | Laterality: Right

## 2019-12-04 MED ORDER — ACETAMINOPHEN 650 MG RE SUPP: 650.0000 mg | RECTAL | Status: DC | PRN

## 2019-12-04 MED ORDER — CLORAZEPATE DIPOTASSIUM 3.75 MG PO TABS
7.5000 mg | ORAL_TABLET | Freq: Every evening | ORAL | Status: DC | PRN
  Filled 2019-12-04: qty 2

## 2019-12-04 MED ORDER — OXYCODONE HCL 5 MG PO TABS: 5.0000 mg | ORAL_TABLET | Freq: Once | ORAL | Status: DC | PRN

## 2019-12-04 MED ORDER — OXYCODONE HCL 5 MG/5ML PO SOLN: 5.0000 mg | Freq: Once | ORAL | Status: DC | PRN

## 2019-12-04 MED ORDER — ALUM & MAG HYDROXIDE-SIMETH 200-200-20 MG/5ML PO SUSP: 30.0000 mL | Freq: Four times a day (QID) | ORAL | Status: DC | PRN

## 2019-12-04 MED ORDER — SUGAMMADEX SODIUM 200 MG/2ML IV SOLN
INTRAVENOUS | Status: DC | PRN
  Administered 2019-12-04: 200 mg via INTRAVENOUS

## 2019-12-04 MED ORDER — CYCLOBENZAPRINE HCL 5 MG PO TABS
5.0000 mg | ORAL_TABLET | Freq: Every evening | ORAL | Status: DC | PRN
  Administered 2019-12-04 – 2019-12-05 (×2): 5 mg via ORAL
  Filled 2019-12-04 (×2): qty 1

## 2019-12-04 MED ORDER — MENTHOL 3 MG MT LOZG: 1.0000 | LOZENGE | OROMUCOSAL | Status: DC | PRN

## 2019-12-04 MED ORDER — MELATONIN 10 MG PO TABS: 10.0000 mg | ORAL_TABLET | Freq: Every day | ORAL | Status: DC

## 2019-12-04 MED ORDER — ATORVASTATIN CALCIUM 40 MG PO TABS
40.0000 mg | ORAL_TABLET | Freq: Every day | ORAL | Status: DC
  Administered 2019-12-04: 40 mg via ORAL
  Filled 2019-12-04: qty 1

## 2019-12-04 MED ORDER — ENALAPRIL MALEATE 5 MG PO TABS
5.0000 mg | ORAL_TABLET | Freq: Every day | ORAL | Status: DC
  Administered 2019-12-05: 5 mg via ORAL
  Filled 2019-12-04 (×2): qty 1

## 2019-12-04 MED ORDER — PHENOL 1.4 % MT LIQD: 1.0000 | OROMUCOSAL | Status: DC | PRN

## 2019-12-04 MED ORDER — PROPOFOL 500 MG/50ML IV EMUL
INTRAVENOUS | Status: DC | PRN
  Administered 2019-12-04: 50 ug/kg/min via INTRAVENOUS

## 2019-12-04 MED ORDER — SODIUM CHLORIDE 0.9% FLUSH
3.0000 mL | Freq: Two times a day (BID) | INTRAVENOUS | Status: DC
  Administered 2019-12-04: 3 mL via INTRAVENOUS

## 2019-12-04 MED ORDER — BUPIVACAINE LIPOSOME 1.3 % IJ SUSP
20.0000 mL | INTRAMUSCULAR | Status: AC
  Administered 2019-12-04: 20 mL
  Filled 2019-12-04: qty 20

## 2019-12-04 MED ORDER — ONDANSETRON HCL 4 MG/2ML IJ SOLN: 4.0000 mg | Freq: Four times a day (QID) | INTRAMUSCULAR | Status: DC | PRN

## 2019-12-04 MED ORDER — METHYLENE BLUE 0.5 % INJ SOLN
INTRAVENOUS | Status: AC
  Filled 2019-12-04: qty 10

## 2019-12-04 MED ORDER — CALCIUM POLYCARBOPHIL 625 MG PO TABS
625.0000 mg | ORAL_TABLET | Freq: Every day | ORAL | Status: DC
  Administered 2019-12-05: 625 mg via ORAL
  Filled 2019-12-04 (×2): qty 1

## 2019-12-04 MED ORDER — METHYLENE BLUE 0.5 % INJ SOLN
INTRAVENOUS | Status: DC | PRN
  Administered 2019-12-04: 1 mL via SUBMUCOSAL

## 2019-12-04 MED ORDER — ROCURONIUM BROMIDE 10 MG/ML (PF) SYRINGE
PREFILLED_SYRINGE | INTRAVENOUS | Status: AC
  Filled 2019-12-04: qty 10

## 2019-12-04 MED ORDER — FENTANYL CITRATE (PF) 100 MCG/2ML IJ SOLN
INTRAMUSCULAR | Status: DC | PRN
  Administered 2019-12-04 (×3): 50 ug via INTRAVENOUS
  Administered 2019-12-04: 100 ug via INTRAVENOUS

## 2019-12-04 MED ORDER — BUPIVACAINE-EPINEPHRINE 0.25% -1:200000 IJ SOLN
INTRAMUSCULAR | Status: DC | PRN
  Administered 2019-12-04: 30 mL

## 2019-12-04 MED ORDER — SODIUM CHLORIDE 0.9% FLUSH: 3.0000 mL | INTRAVENOUS | Status: DC | PRN

## 2019-12-04 MED ORDER — OXYCODONE-ACETAMINOPHEN 5-325 MG PO TABS
1.0000 | ORAL_TABLET | ORAL | Status: DC | PRN
  Administered 2019-12-04 – 2019-12-05 (×3): 2 via ORAL
  Filled 2019-12-04 (×3): qty 2

## 2019-12-04 MED ORDER — ZOLPIDEM TARTRATE 5 MG PO TABS: 5.0000 mg | ORAL_TABLET | Freq: Every evening | ORAL | Status: DC | PRN

## 2019-12-04 MED ORDER — LIDOCAINE 2% (20 MG/ML) 5 ML SYRINGE
INTRAMUSCULAR | Status: DC | PRN
  Administered 2019-12-04: 100 mg via INTRAVENOUS

## 2019-12-04 MED ORDER — ACETAMINOPHEN 325 MG PO TABS: 650.0000 mg | ORAL_TABLET | ORAL | Status: DC | PRN

## 2019-12-04 MED ORDER — BUPIVACAINE HCL (PF) 0.25 % IJ SOLN
INTRAMUSCULAR | Status: AC
  Filled 2019-12-04: qty 30

## 2019-12-04 MED ORDER — ONDANSETRON HCL 4 MG/2ML IJ SOLN
INTRAMUSCULAR | Status: DC | PRN
  Administered 2019-12-04: 4 mg via INTRAVENOUS

## 2019-12-04 MED ORDER — MORPHINE SULFATE (PF) 2 MG/ML IV SOLN: 1.0000 mg | INTRAVENOUS | Status: DC | PRN

## 2019-12-04 MED ORDER — DOXYLAMINE SUCCINATE (SLEEP) 25 MG PO TABS: 25.0000 mg | ORAL_TABLET | Freq: Every day | ORAL | Status: DC

## 2019-12-04 MED ORDER — FENTANYL CITRATE (PF) 250 MCG/5ML IJ SOLN
INTRAMUSCULAR | Status: AC
  Filled 2019-12-04: qty 5

## 2019-12-04 MED ORDER — LACTATED RINGERS IV SOLN: INTRAVENOUS | Status: DC

## 2019-12-04 MED ORDER — HYDROCHLOROTHIAZIDE 25 MG PO TABS
25.0000 mg | ORAL_TABLET | Freq: Every day | ORAL | Status: DC
  Administered 2019-12-05: 25 mg via ORAL
  Filled 2019-12-04: qty 1

## 2019-12-04 MED ORDER — PHENYLEPHRINE HCL-NACL 10-0.9 MG/250ML-% IV SOLN
INTRAVENOUS | Status: DC | PRN
  Administered 2019-12-04: 15 ug/min via INTRAVENOUS

## 2019-12-04 MED ORDER — ROCURONIUM BROMIDE 50 MG/5ML IV SOSY
PREFILLED_SYRINGE | INTRAVENOUS | Status: DC | PRN
  Administered 2019-12-04: 50 mg via INTRAVENOUS
  Administered 2019-12-04 (×2): 20 mg via INTRAVENOUS

## 2019-12-04 MED ORDER — ONDANSETRON HCL 4 MG/2ML IJ SOLN: 4.0000 mg | Freq: Once | INTRAMUSCULAR | Status: DC | PRN

## 2019-12-04 MED ORDER — ONDANSETRON HCL 4 MG PO TABS: 4.0000 mg | ORAL_TABLET | Freq: Four times a day (QID) | ORAL | Status: DC | PRN

## 2019-12-04 MED ORDER — VITAMIN E 45 MG (100 UNIT) PO CAPS: 100.0000 [IU] | ORAL_CAPSULE | Freq: Every day | ORAL | Status: DC

## 2019-12-04 MED ORDER — 0.9 % SODIUM CHLORIDE (POUR BTL) OPTIME
TOPICAL | Status: DC | PRN
  Administered 2019-12-04 (×2): 1000 mL

## 2019-12-04 MED ORDER — DIPHENHYDRAMINE HCL 25 MG PO TABS: 25.0000 mg | ORAL_TABLET | Freq: Every day | ORAL | Status: DC

## 2019-12-04 MED ORDER — SENNOSIDES-DOCUSATE SODIUM 8.6-50 MG PO TABS: 1.0000 | ORAL_TABLET | Freq: Every evening | ORAL | Status: DC | PRN

## 2019-12-04 MED ORDER — FENTANYL CITRATE (PF) 100 MCG/2ML IJ SOLN: 25.0000 ug | INTRAMUSCULAR | Status: DC | PRN

## 2019-12-04 MED ORDER — BISACODYL 5 MG PO TBEC: 5.0000 mg | DELAYED_RELEASE_TABLET | Freq: Every day | ORAL | Status: DC | PRN

## 2019-12-04 MED ORDER — FLEET ENEMA 7-19 GM/118ML RE ENEM: 1.0000 | ENEMA | Freq: Once | RECTAL | Status: DC | PRN

## 2019-12-04 MED ORDER — DOCUSATE SODIUM 100 MG PO CAPS
100.0000 mg | ORAL_CAPSULE | Freq: Two times a day (BID) | ORAL | Status: DC
  Administered 2019-12-04 – 2019-12-05 (×2): 100 mg via ORAL
  Filled 2019-12-04 (×2): qty 1

## 2019-12-04 MED ORDER — TRAZODONE HCL 100 MG PO TABS
200.0000 mg | ORAL_TABLET | Freq: Every day | ORAL | Status: DC
  Administered 2019-12-04: 200 mg via ORAL
  Filled 2019-12-04 (×2): qty 2

## 2019-12-04 MED ORDER — ONDANSETRON HCL 4 MG/2ML IJ SOLN
INTRAMUSCULAR | Status: AC
  Filled 2019-12-04: qty 2

## 2019-12-04 MED ORDER — LIDOCAINE 2% (20 MG/ML) 5 ML SYRINGE
INTRAMUSCULAR | Status: AC
  Filled 2019-12-04: qty 10

## 2019-12-04 MED ORDER — EPINEPHRINE PF 1 MG/ML IJ SOLN
INTRAMUSCULAR | Status: AC
  Filled 2019-12-04: qty 1

## 2019-12-04 MED ORDER — METHOCARBAMOL 500 MG PO TABS
500.0000 mg | ORAL_TABLET | Freq: Four times a day (QID) | ORAL | Status: DC | PRN
  Administered 2019-12-05: 500 mg via ORAL
  Filled 2019-12-04: qty 1

## 2019-12-04 MED ORDER — THROMBIN 20000 UNITS EX SOLR
CUTANEOUS | Status: DC | PRN
  Administered 2019-12-04: 14:00:00 20000 [IU] via TOPICAL

## 2019-12-04 MED ORDER — POVIDONE-IODINE 7.5 % EX SOLN
Freq: Once | CUTANEOUS | Status: DC
  Filled 2019-12-04: qty 118

## 2019-12-04 MED ORDER — VANCOMYCIN HCL 1500 MG/300ML IV SOLN
1500.0000 mg | Freq: Once | INTRAVENOUS | Status: AC
  Administered 2019-12-04: 1500 mg via INTRAVENOUS
  Filled 2019-12-04: qty 300

## 2019-12-04 MED ORDER — SODIUM CHLORIDE 0.9 % IV SOLN
250.0000 mL | INTRAVENOUS | Status: DC
  Administered 2019-12-04: 250 mL via INTRAVENOUS

## 2019-12-04 MED ORDER — DOXYLAMINE SUCCINATE (SLEEP) 25 MG PO TABS
25.0000 mg | ORAL_TABLET | Freq: Every day | ORAL | Status: DC
  Administered 2019-12-04: 25 mg via ORAL
  Filled 2019-12-04 (×2): qty 1

## 2019-12-04 MED ORDER — PROPOFOL 10 MG/ML IV BOLUS
INTRAVENOUS | Status: DC | PRN
  Administered 2019-12-04: 30 mg via INTRAVENOUS
  Administered 2019-12-04: 130 mg via INTRAVENOUS
  Administered 2019-12-04: 40 mg via INTRAVENOUS

## 2019-12-04 MED ORDER — THROMBIN (RECOMBINANT) 20000 UNITS EX SOLR
CUTANEOUS | Status: AC
  Filled 2019-12-04: qty 20000

## 2019-12-04 MED ORDER — PROPOFOL 10 MG/ML IV BOLUS
INTRAVENOUS | Status: AC
  Filled 2019-12-04: qty 20

## 2019-12-04 SURGICAL SUPPLY — 98 items
AGENT HMST KT MTR STRL THRMB (HEMOSTASIS)
APL SKNCLS STERI-STRIP NONHPOA (GAUZE/BANDAGES/DRESSINGS) ×1
BENZOIN TINCTURE PRP APPL 2/3 (GAUZE/BANDAGES/DRESSINGS) ×3 IMPLANT
BONE VIVIGEN FORMABLE 10CC (Bone Implant) ×3 IMPLANT
BUR PRECISION FLUTE 5.0 (BURR) ×2 IMPLANT
BUR PRESCISION 1.7 ELITE (BURR) ×3 IMPLANT
BUR ROUND FLUTED 5 RND (BURR) ×2 IMPLANT
BUR ROUND FLUTED 5MM RND (BURR) ×1
BUR ROUND PRECISION 4.0 (BURR) IMPLANT
BUR ROUND PRECISION 4.0MM (BURR)
BUR SABER RD CUTTING 3.0 (BURR) IMPLANT
BUR SABER RD CUTTING 3.0MM (BURR)
CAGE BULLET CONCORDE 9X10X27 (Cage) ×1 IMPLANT
CAGE BULLET CONCORDE 9X10X27MM (Cage) ×1 IMPLANT
CARTRIDGE OIL MAESTRO DRILL (MISCELLANEOUS) ×1 IMPLANT
CLOSURE STERI-STRIP 1/2X4 (GAUZE/BANDAGES/DRESSINGS) ×1
CLOSURE WOUND 1/2 X4 (GAUZE/BANDAGES/DRESSINGS) ×2
CLSR STERI-STRIP ANTIMIC 1/2X4 (GAUZE/BANDAGES/DRESSINGS) ×1 IMPLANT
CNTNR URN SCR LID CUP LEK RST (MISCELLANEOUS) ×1 IMPLANT
CONT SPEC 4OZ STRL OR WHT (MISCELLANEOUS) ×3
COVER BACK TABLE 60X90IN (DRAPES) ×3 IMPLANT
COVER MAYO STAND STRL (DRAPES) ×6 IMPLANT
COVER SURGICAL LIGHT HANDLE (MISCELLANEOUS) ×3 IMPLANT
COVER WAND RF STERILE (DRAPES) ×3 IMPLANT
DIFFUSER DRILL AIR PNEUMATIC (MISCELLANEOUS) ×3 IMPLANT
DRAIN CHANNEL 15F RND FF W/TCR (WOUND CARE) IMPLANT
DRAPE C-ARM 42X72 X-RAY (DRAPES) ×3 IMPLANT
DRAPE C-ARMOR (DRAPES) ×2 IMPLANT
DRAPE POUCH INSTRU U-SHP 10X18 (DRAPES) ×3 IMPLANT
DRAPE SURG 17X23 STRL (DRAPES) ×12 IMPLANT
DURAPREP 26ML APPLICATOR (WOUND CARE) ×3 IMPLANT
ELECT BLADE 4.0 EZ CLEAN MEGAD (MISCELLANEOUS) ×3
ELECT CAUTERY BLADE 6.4 (BLADE) ×3 IMPLANT
ELECT REM PT RETURN 9FT ADLT (ELECTROSURGICAL) ×3
ELECTRODE BLDE 4.0 EZ CLN MEGD (MISCELLANEOUS) ×1 IMPLANT
ELECTRODE REM PT RTRN 9FT ADLT (ELECTROSURGICAL) ×1 IMPLANT
EVACUATOR SILICONE 100CC (DRAIN) IMPLANT
FEE INTRAOP MONITOR IMPULS NCS (MISCELLANEOUS) IMPLANT
FILTER STRAW FLUID ASPIR (MISCELLANEOUS) ×3 IMPLANT
GAUZE 4X4 16PLY RFD (DISPOSABLE) ×3 IMPLANT
GAUZE SPONGE 4X4 12PLY STRL (GAUZE/BANDAGES/DRESSINGS) ×3 IMPLANT
GLOVE BIO SURGEON STRL SZ7 (GLOVE) ×3 IMPLANT
GLOVE BIO SURGEON STRL SZ8 (GLOVE) ×3 IMPLANT
GLOVE BIOGEL PI IND STRL 7.0 (GLOVE) ×1 IMPLANT
GLOVE BIOGEL PI IND STRL 8 (GLOVE) ×1 IMPLANT
GLOVE BIOGEL PI INDICATOR 7.0 (GLOVE) ×2
GLOVE BIOGEL PI INDICATOR 8 (GLOVE) ×2
GOWN STRL REUS W/ TWL LRG LVL3 (GOWN DISPOSABLE) ×2 IMPLANT
GOWN STRL REUS W/ TWL XL LVL3 (GOWN DISPOSABLE) ×1 IMPLANT
GOWN STRL REUS W/TWL LRG LVL3 (GOWN DISPOSABLE) ×6
GOWN STRL REUS W/TWL XL LVL3 (GOWN DISPOSABLE) ×3
GRAFT BNE MATRIX VG FRMBL L 10 (Bone Implant) IMPLANT
INTRAOP MONITOR FEE IMPULS NCS (MISCELLANEOUS) ×1
INTRAOP MONITOR FEE IMPULSE (MISCELLANEOUS) ×2
IV CATH 14GX2 1/4 (CATHETERS) ×3 IMPLANT
KIT BASIN OR (CUSTOM PROCEDURE TRAY) ×3 IMPLANT
KIT POSITION SURG JACKSON T1 (MISCELLANEOUS) ×3 IMPLANT
KIT TURNOVER KIT B (KITS) ×3 IMPLANT
MARKER SKIN DUAL TIP RULER LAB (MISCELLANEOUS) ×6 IMPLANT
NDL 18GX1X1/2 (RX/OR ONLY) (NEEDLE) ×1 IMPLANT
NDL HYPO 25GX1X1/2 BEV (NEEDLE) ×1 IMPLANT
NDL SPNL 18GX3.5 QUINCKE PK (NEEDLE) ×2 IMPLANT
NEEDLE 18GX1X1/2 (RX/OR ONLY) (NEEDLE) ×3 IMPLANT
NEEDLE 22X1 1/2 (OR ONLY) (NEEDLE) ×6 IMPLANT
NEEDLE HYPO 25GX1X1/2 BEV (NEEDLE) ×3 IMPLANT
NEEDLE SPNL 18GX3.5 QUINCKE PK (NEEDLE) ×6 IMPLANT
NS IRRIG 1000ML POUR BTL (IV SOLUTION) ×5 IMPLANT
OIL CARTRIDGE MAESTRO DRILL (MISCELLANEOUS) ×3
PACK LAMINECTOMY ORTHO (CUSTOM PROCEDURE TRAY) ×3 IMPLANT
PACK UNIVERSAL I (CUSTOM PROCEDURE TRAY) ×3 IMPLANT
PAD ARMBOARD 7.5X6 YLW CONV (MISCELLANEOUS) ×6 IMPLANT
PATTIES SURGICAL .5 X1 (DISPOSABLE) ×3 IMPLANT
PATTIES SURGICAL .5X1.5 (GAUZE/BANDAGES/DRESSINGS) ×3 IMPLANT
PROBE PED 2.3 SCRW/BT NCS (MISCELLANEOUS) IMPLANT
PROBE PEDCLE 2.3 SCRW/BALL TIP (MISCELLANEOUS) ×2
ROD PRE BENT EXPEDIUM 35MM (Rod) ×4 IMPLANT
SCREW SET SINGLE INNER (Screw) ×8 IMPLANT
SCREW VIPER CORT FIX 6.00X30 (Screw) ×8 IMPLANT
SPONGE INTESTINAL PEANUT (DISPOSABLE) ×3 IMPLANT
SPONGE SURGIFOAM ABS GEL 100 (HEMOSTASIS) ×3 IMPLANT
STRIP CLOSURE SKIN 1/2X4 (GAUZE/BANDAGES/DRESSINGS) ×4 IMPLANT
SURGIFLO W/THROMBIN 8M KIT (HEMOSTASIS) IMPLANT
SUT MNCRL AB 4-0 PS2 18 (SUTURE) ×3 IMPLANT
SUT VIC AB 0 CT1 18XCR BRD 8 (SUTURE) ×1 IMPLANT
SUT VIC AB 0 CT1 8-18 (SUTURE) ×3
SUT VIC AB 1 CT1 18XCR BRD 8 (SUTURE) ×1 IMPLANT
SUT VIC AB 1 CT1 8-18 (SUTURE) ×3
SUT VIC AB 2-0 CT2 18 VCP726D (SUTURE) ×3 IMPLANT
SYR 20ML LL LF (SYRINGE) ×6 IMPLANT
SYR BULB IRRIGATION 50ML (SYRINGE) ×3 IMPLANT
SYR CONTROL 10ML LL (SYRINGE) ×8 IMPLANT
SYR TB 1ML LUER SLIP (SYRINGE) ×3 IMPLANT
TAP EXPEDIUM DL 4.35 (INSTRUMENTS) ×2 IMPLANT
TAP EXPEDIUM DL 5.0 (INSTRUMENTS) ×2 IMPLANT
TAP EXPEDIUM DL 6.0 (INSTRUMENTS) ×2 IMPLANT
TRAY FOLEY MTR SLVR 16FR STAT (SET/KITS/TRAYS/PACK) ×3 IMPLANT
WATER STERILE IRR 1000ML POUR (IV SOLUTION) ×3 IMPLANT
YANKAUER SUCT BULB TIP NO VENT (SUCTIONS) ×3 IMPLANT

## 2019-12-04 NOTE — H&P (Signed)
PREOPERATIVE H&P  Chief Complaint: Right > left leg pain  HPI: VERNE CHAUSSEE is a 74 y.o. male who presents with ongoing pain in the bilateral legs  MRI reveals severe spinal stenosis and instability at L4/5  Patient has failed multiple forms of conservative care and continues to have pain (see office notes for additional details regarding the patient's full course of treatment)  Past Medical History:  Diagnosis Date   Anxiety and depression 11/04/2013   Arthritis    BCC (basal cell carcinoma), leg    Right calf, L nape of neck at hairline   CAD (coronary artery disease)    CP, cath, non-obstructive   Diverticulosis    colon   Erectile dysfunction following radical prostatectomy    Hyperlipidemia    Hypertension    Insomnia    Melanoma ([place]) 2010   face, sees derm    Prostate CA ([place]) 10/08   s/p robotic surgery   Spermatocele    Left   Past Surgical History:  Procedure Laterality Date   APPENDECTOMY     HERNIA REPAIR     KNEE ARTHROSCOPY Left    meniscal tear   PROSTATECTOMY  07-2007    robotic,  for prostate cancer     SHOULDER ARTHROSCOPY  07-09-2013   Dr Tamera Punt    Social History   Socioeconomic History   Marital status: Married    Spouse name: Not on file   Number of children: 2   Years of education: Not on file   Highest education level: Not on file  Occupational History   Occupation: retired Government social research officer   Tobacco Use   Smoking status: Never Smoker   Smokeless tobacco: Never Used  Substance and Sexual Activity   Alcohol use: Yes    Alcohol/week: 7.0 standard drinks    Types: 7 Glasses of wine per week    Comment: socially; 1 glass of wine/day   Drug use: Never   Sexual activity: Not on file  Other Topics Concern   Not on file  Social History Narrative   Lives w/ wife    Original from [place]   Social Determinants of Health   Financial Resource Strain:    Difficulty of Paying Living Expenses: Not on file  Food Insecurity:    Worried About Charity fundraiser in the Last Year: Not on file   [employer] of Food in the Last Year: Not on file  Transportation Needs:    Lack of Transportation (Medical): Not on file   Lack of Transportation (Non-Medical): Not on file  Physical Activity:    Days of Exercise per Week: Not on file   Minutes of Exercise per Session: Not on file  Stress:    Feeling of Stress : Not on file  Social Connections:    Frequency of Communication with Friends and Family: Not on file   Frequency of Social Gatherings with Friends and Family: Not on file   Attends Religious Services: Not on file   Active Member of Clubs or Organizations: Not on file   Attends Archivist Meetings: Not on file   Marital Status: Not on file   Family History  Problem Relation Age of Onset   Coronary artery disease Father 12       dx age 5s   Stroke Mother    Hypertension Neg Hx    Diabetes Neg Hx    Colon cancer Neg Hx    Prostate cancer Neg  Hx    Allergies  Allergen Reactions   Aspirin Other (See Comments)    REACTION: swelling in face   Hydrocodone     Insomnia, mild SOB (w/o cough-wheezing)   Penicillins Other (See Comments)    REACTION: swelling in face  Did it involve swelling of the face/tongue/throat, SOB, or low BP? Yes Did it involve sudden or severe rash/hives, skin peeling, or any reaction on the inside of your mouth or nose? No Did you need to seek medical attention at a hospital or doctor's office? Yes When did it last happen?      [id number] played Hockey If all above answers are "NO", may proceed with cephalosporin use.   Prior to Admission medications   Medication Sig Start Date End Date Taking? Authorizing Provider  atorvastatin (LIPITOR) 40 MG tablet Take 1 tablet (40 mg total) by mouth at bedtime. 07/26/19  Yes Paz, Alda Berthold, MD  Calcium Polycarbophil (FIBER LAXATIVE PO) Take 1 tablet by mouth daily.    Yes [provider]  clorazepate (TRANXENE) 7.5 MG tablet Take 7.5  mg by mouth at bedtime as needed for anxiety.   Yes [provider]  cyclobenzaprine (FLEXERIL) 5 MG tablet Take 5 mg by mouth at bedtime as needed for muscle spasms.   Yes [provider]  diphenhydrAMINE (BENADRYL) 25 MG tablet Take 25 mg by mouth at bedtime.   Yes [provider]  diphenhydramine-acetaminophen (TYLENOL PM) 25-500 MG TABS tablet Take 1 tablet by mouth at bedtime as needed.   Yes [provider]  doxylamine, Sleep, (UNISOM) 25 MG tablet Take 25 mg by mouth at bedtime.   Yes [provider]  enalapril (VASOTEC) 5 MG tablet Take 1 tablet (5 mg total) by mouth daily. 10/15/19  Yes Paz, Alda Berthold, MD  fish oil-omega-3 fatty acids 1000 MG capsule Take 2 g by mouth daily.     Yes [provider]  hydrochlorothiazide (HYDRODIURIL) 25 MG tablet Take 1 tablet (25 mg total) by mouth daily. 07/17/19  Yes Paz, Alda Berthold, MD  Melatonin 10 MG TABS Take 10 mg by mouth at bedtime.   Yes [provider]  methocarbamol (ROBAXIN) 500 MG tablet Take 500 mg by mouth every 6 (six) hours as needed for muscle spasms.   Yes [provider]  multivitamin [facility]) per tablet Take 1 tablet by mouth daily.     Yes [provider]  traZODone (DESYREL) 100 MG tablet Take 2 tablets (200 mg total) by mouth at bedtime. 08/21/19  Yes Paz, Alda Berthold, MD  vitamin E 100 UNIT capsule Take 100 Units by mouth daily.     Yes [provider]     All other systems have been reviewed and were otherwise negative with the exception of those mentioned in the HPI and as above.  Physical Exam: Vitals:   12/04/19 0813 12/04/19 0817  BP:  (!) 149/82  Pulse: 87   Resp: 19   Temp: 98.2 F (36.8 C)   SpO2: 95%     Body mass index is 33 kg/m.  General: Alert, no acute distress Cardiovascular: No pedal edema Respiratory: No cyanosis, no use of accessory musculature Skin: No lesions in the area of chief complaint Neurologic: Sensation  intact distally Psychiatric: Patient is competent for consent with normal mood and affect Lymphatic: No axillary or cervical lymphadenopathy   Assessment/Plan: SEVERE LUMBAR 4-LUMBAR 5 SPINAL STENOSIS WITH A GRADE 1 LUMBAR 4-LUMBAR 5 SPONDYLOLISTHESIS Plan for Procedure(s): RIGHT-SIDED TRANSFORAMINAL LUMBAR  INTERBODY FUSION LUMBAR 4-5 WITH INSTRUMENTATION AND ALLOGRAFT   Norva Karvonen, MD 12/04/2019 8:41 AM

## 2019-12-04 NOTE — Op Note (Signed)
PATIENT NAME: Larry Elliott RECORD NO.:   [id number]   DATE OF BIRTH: 1945-12-17   DATE OF PROCEDURE: 12/04/2019                               OPERATIVE REPORT     PREOPERATIVE DIAGNOSES: 1. Bilateral lumbar radiculopathy. 2. Severe L4-5 spinal stenosis. 3. L4-5 spondylolisthesis   POSTOPERATIVE DIAGNOSES: 1. Bilateral lumbar radiculopathy. 2. Severe L4-5 spinal stenosis. 3. L4-5 spondylolisthesis   PROCEDURES: 1. L4/5 decompression 2. Right-sided L4-5 transforaminal lumbar interbody fusion. 3. Left-sided L4-5 posterolateral fusion. 4. Insertion of interbody device x1 (10 mm Concorde intervertebral spacer). 5. Placement of segmental posterior instrumentation L4, L5 bilaterally  6. Use of local autograft. 7. Use of morselized allograft - Vivigen 8. Intraoperative use of fluoroscopy.   SURGEON:  Phylliss Bob, MD.   ASSISTANTPricilla Holm, PA-C.   ANESTHESIA:  General endotracheal anesthesia.   COMPLICATIONS:  None.   DISPOSITION:  Stable.   ESTIMATED BLOOD LOSS:  100cc   INDICATIONS FOR SURGERY:  Briefly, Larry Elliott is a pleasant 74 year old male who did present to me with severe and ongoing pain in the right and left legs. I did feel that the symptoms were secondary to the findings noted above.   The patient failed conservative care and did wish to proceed with the procedure  noted above.   OPERATIVE DETAILS:  On 12/04/2019, the patient was brought to surgery and general endotracheal anesthesia was administered.  The patient was placed prone on a well-padded flat Jackson bed with a spinal frame.  Antibiotics were given and a time-out procedure was performed. The back was prepped and draped in the usual fashion.  A midline incision was made overlying the L4-5 intervertebral spaces.  The fascia was incised at the midline.  The paraspinal musculature was bluntly swept laterally.  Anatomic landmarks for the pedicles were exposed. Using fluoroscopy, I  did cannulate the L4 and L5 pedicles bilaterally, using a medial to lateral cortical trajectory technique.  At this point, 6 x 30 mm screws were placed into the left pedicles, and a 35 mm rod was placed into the tulip heads of the screw, and caps were also placed.  Distraction was then applied across the L4-5 intervertebral space, and the caps were then provisionally tightened.  On the right side, bone wax was placed into the cannulated pedicle holes.  I then proceeded with the decompressive aspect of the procedure at the L4-5 level.  A partial facetectomy was performed bilaterally at L4-5, decompressing the L4-5 intervertebral space.  I was very pleased with the decompression. With an assistant holding medial retraction of the traversing right L5 nerve, I did perform an annulotomy at the posterolateral aspect of the L4-5 intervertebral space.  I then used a series of curettes and pituitary rongeurs to perform a thorough and complete intervertebral diskectomy.  The intervertebral space was then liberally packed with autograft as well as allograft in the form of Vivigen, as was the appropriate-sized intervertebral spacer (10 mm, lordotic).  The spacer was then tamped into position in the usual fashion.  I was very pleased with the press-fit of the spacer.  I then placed 6 mm screws on the right at L4 and L5. A 35-mm rod was then placed and caps were placed. The distraction was then released on the contralateral side.  All caps were then locked.  The wound was copiously irrigated with  a total of approximately 3 L prior to placing the bone graft.  Additional autograft and allograft was then packed into the posterolateral gutter on the left side to help aid in the success of the fusion.  The wound was  explored for any undue bleeding and there was no substantial bleeding encountered. Gel-Foam was placed over the laminectomy site.  The wound was then closed in layers using #1 Vicryl followed by 2-0 Vicryl,  followed by 4-0 Monocryl.  Benzoin and Steri-Strips were applied followed by sterile dressing.     Of note, did use triggered EMG to test the screws on the left, and there was no screw that tested below 20 mA. There was no abnormal EMG activity noted throughout the surgery.   Of note, Pricilla Holm was my assistant throughout surgery, and did aid in retraction, placement of the hardware, suctioning, and closure.       Phylliss Bob, MD

## 2019-12-04 NOTE — Progress Notes (Signed)
Pharmacy Antibiotic Note  Larry Elliott is a 74 y.o. male admitted on 12/04/2019 s/p RIGHT-SIDED TRANSFORAMINAL LUMBAR INTERBODY FUSION LUMBAR FOUR THROUGH FIVE WITH INSTRUMENTATION AND ALLOGRAFT (Right Spine Lumbar) .  Pharmacy has been consulted for vancomycin dosing for post surgical prophylaxis. No drains noted.   Plan: Vancomycin 1500mg  IV x 1,  12 hours after preop dose Pharmacy will sign off.   Height: 5\' 10"  (177.8 cm) Weight: 230 lb (104.3 kg) IBW/kg (Calculated) : 73  Temp (24hrs), Avg:98.2 F (36.8 C), Min:97.9 F (36.6 C), Max:98.5 F (36.9 C)  No results for input(s): WBC, CREATININE, LATICACIDVEN, VANCOTROUGH, VANCOPEAK, VANCORANDOM, GENTTROUGH, GENTPEAK, GENTRANDOM, TOBRATROUGH, TOBRAPEAK, TOBRARND, AMIKACINPEAK, AMIKACINTROU, AMIKACIN in the last 168 hours.  Estimated Creatinine Clearance: 87.4 mL/min (by C-G formula based on SCr of 0.87 mg/dL).    Allergies  Allergen Reactions  . Aspirin Other (See Comments)    REACTION: swelling in face  . Hydrocodone     Insomnia, mild SOB (w/o cough-wheezing)  . Penicillins Other (See Comments)    REACTION: swelling in face  Did it involve swelling of the face/tongue/throat, SOB, or low BP? Yes Did it involve sudden or severe rash/hives, skin peeling, or any reaction on the inside of your mouth or nose? No Did you need to seek medical attention at a hospital or doctor's office? Yes When did it last happen?[id number] played Hockey If all above answers are "NO", may proceed with cephalosporin use.    [name] A. Levada Dy, PharmD, BCPS, FNKF Clinical Pharmacist [place] Please utilize Amion for appropriate phone number to reach the unit pharmacist ([place])   12/04/2019 6:02 PM

## 2019-12-04 NOTE — Anesthesia Postprocedure Evaluation (Signed)
Anesthesia Post Note  Patient: Larry Elliott  Procedure(s) Performed: RIGHT-SIDED TRANSFORAMINAL LUMBAR INTERBODY FUSION LUMBAR FOUR THROUGH FIVE WITH INSTRUMENTATION AND ALLOGRAFT (Right Spine Lumbar)     Patient location during evaluation: PACU Anesthesia Type: General Level of consciousness: awake and alert Pain management: pain level controlled Vital Signs Assessment: post-procedure vital signs reviewed and stable Respiratory status: spontaneous breathing, nonlabored ventilation and respiratory function stable Cardiovascular status: blood pressure returned to baseline and stable Postop Assessment: no apparent nausea or vomiting Anesthetic complications: no    Last Vitals:  Vitals:   12/04/19 1724 12/04/19 1741  BP: 123/71 131/60  Pulse: 80 74  Resp: 20 20  Temp:  36.9 C  SpO2: 94%     Last Pain:  Vitals:   12/04/19 1741  TempSrc: Oral  PainSc:                  Lidia Collum

## 2019-12-04 NOTE — Transfer of Care (Signed)
Immediate Anesthesia Transfer of Care Note  Patient: Larry Elliott  Procedure(s) Performed: RIGHT-SIDED TRANSFORAMINAL LUMBAR INTERBODY FUSION LUMBAR FOUR THROUGH FIVE WITH INSTRUMENTATION AND ALLOGRAFT (Right Spine Lumbar)  Patient Location: PACU  Anesthesia Type:General  Level of Consciousness: awake, alert , oriented and sedated  Airway & Oxygen Therapy: Patient Spontanous Breathing and Patient connected to face mask oxygen  Post-op Assessment: Report given to RN, Post -op Vital signs reviewed and stable and Patient moving all extremities  Post vital signs: Reviewed and stable  Last Vitals:  Vitals Value Taken Time  BP 120/61 12/04/19 1625  Temp    Pulse 81 12/04/19 1628  Resp 21 12/04/19 1628  SpO2 97 % 12/04/19 1628  Vitals shown include unvalidated device data.  Last Pain:  Vitals:   12/04/19 0921  PainSc: 0-No pain      Patients Stated Pain Goal: 3 ([id number])  Complications: No apparent anesthesia complications

## 2019-12-04 NOTE — Anesthesia Procedure Notes (Signed)
Procedure Name: Intubation Date/Time: 12/04/2019 12:41 PM Performed by: Scheryl Darter, CRNA Pre-anesthesia Checklist: Patient identified, Emergency Drugs available, Suction available and Patient being monitored Patient Re-evaluated:Patient Re-evaluated prior to induction Oxygen Delivery Method: Circle System Utilized Preoxygenation: Pre-oxygenation with 100% oxygen Induction Type: IV induction Ventilation: Mask ventilation without difficulty Laryngoscope Size: Miller and 3 Grade View: Grade I Tube type: Oral Tube size: 7.5 mm Number of attempts: 1 Airway Equipment and Method: Stylet and Oral airway Placement Confirmation: ETT inserted through vocal cords under direct vision,  positive ETCO2 and breath sounds checked- equal and bilateral Secured at: 24 cm Tube secured with: Tape Dental Injury: Teeth and Oropharynx as per pre-operative assessment

## 2019-12-04 NOTE — Anesthesia Preprocedure Evaluation (Signed)
Anesthesia Evaluation  Patient identified by MRN, date of birth, ID band Patient awake    Reviewed: Allergy & Precautions, NPO status , Patient's Chart, lab work & pertinent test results  History of Anesthesia Complications Negative for: history of anesthetic complications  Airway Mallampati: II  TM Distance: >3 FB Neck ROM: Full    Dental  (+) Teeth Intact   Pulmonary neg pulmonary ROS,    Pulmonary exam normal        Cardiovascular hypertension, Pt. on medications + CAD (nonobstructive)  Normal cardiovascular exam     Neuro/Psych PSYCHIATRIC DISORDERS Anxiety Depression negative neurological ROS     GI/Hepatic negative GI ROS, Neg liver ROS,   Endo/Other  negative endocrine ROS  Renal/GU negative Renal ROS   H/o prostate cancer    Musculoskeletal negative musculoskeletal ROS (+)   Abdominal   Peds  Hematology negative hematology ROS (+)   Anesthesia Other Findings   Reproductive/Obstetrics                            Anesthesia Physical Anesthesia Plan  ASA: III  Anesthesia Plan: General   Post-op Pain Management:    Induction: Intravenous  PONV Risk Score and Plan: 2 and Ondansetron, Dexamethasone, Treatment may vary due to age or medical condition and Midazolam  Airway Management Planned: Oral ETT  Additional Equipment: None  Intra-op Plan:   Post-operative Plan: Extubation in OR  Informed Consent: I have reviewed the patients History and Physical, chart, labs and discussed the procedure including the risks, benefits and alternatives for the proposed anesthesia with the patient or authorized representative who has indicated his/her understanding and acceptance.     Dental advisory given  Plan Discussed with:   Anesthesia Plan Comments:         Anesthesia Quick Evaluation

## 2019-12-05 DIAGNOSIS — M5416 Radiculopathy, lumbar region: Secondary | ICD-10-CM | POA: Diagnosis not present

## 2019-12-05 DIAGNOSIS — E785 Hyperlipidemia, unspecified: Secondary | ICD-10-CM | POA: Diagnosis not present

## 2019-12-05 DIAGNOSIS — M48061 Spinal stenosis, lumbar region without neurogenic claudication: Secondary | ICD-10-CM | POA: Diagnosis not present

## 2019-12-05 DIAGNOSIS — I10 Essential (primary) hypertension: Secondary | ICD-10-CM | POA: Diagnosis not present

## 2019-12-05 DIAGNOSIS — I2510 Atherosclerotic heart disease of native coronary artery without angina pectoris: Secondary | ICD-10-CM | POA: Diagnosis not present

## 2019-12-05 DIAGNOSIS — M4316 Spondylolisthesis, lumbar region: Secondary | ICD-10-CM | POA: Diagnosis not present

## 2019-12-05 MED ORDER — OXYCODONE-ACETAMINOPHEN 5-325 MG PO TABS: 1.0000 | ORAL_TABLET | ORAL | 0 refills | Status: DC | PRN

## 2019-12-05 MED FILL — Thrombin (Recombinant) For Soln 20000 Unit: CUTANEOUS | Qty: 1 | Status: AC

## 2019-12-05 NOTE — Progress Notes (Signed)
Patient is discharged from room 3C10 at this time. Alert and in stable condition. IV site d/c'd and instructions read to patient and daughter with understanding verbalized. Left unit via wheelchair with all belongings at side.

## 2019-12-05 NOTE — Evaluation (Signed)
Physical Therapy Evaluation Patient Details Name: Larry Elliott MRN: [id number] DOB: 11-Apr-1946 Today's Date: 12/05/2019   History of Present Illness  Pt is a 74 y.o. M with significant PMH of prostate CA who presents s/p L4/5 decompression and fusion.  Clinical Impression  Patient evaluated by Physical Therapy with no further acute PT needs identified. Pt reports excellent pain control and resolution of radicular symptoms. Pt ambulating hallway distances without an assistive device or physical assist. Education provided regarding brace use, spinal precautions, cryotherapy, generalized walking program, car transfer technique. All education has been completed and the patient has no further questions. No follow-up Physical Therapy or equipment needs. PT is signing off. Thank you for this referral.     Follow Up Recommendations No PT follow up    Equipment Recommendations  None recommended by PT    Recommendations for Other Services       Precautions / Restrictions Precautions Precautions: Back Precaution Booklet Issued: Yes (comment) Precaution Comments: Verbally reviewed, provided written handout Required Braces or Orthoses: Spinal Brace Spinal Brace: Thoracolumbosacral orthotic Restrictions Weight Bearing Restrictions: No      Mobility  Bed Mobility               General bed mobility comments: Sitting EOB upon arrival  Transfers Overall transfer level: Modified independent Equipment used: None                Ambulation/Gait Ambulation/Gait assistance: Modified independent (Device/Increase time) Gait Distance (Feet): 400 Feet Assistive device: None Gait Pattern/deviations: Step-through pattern;Wide base of support     General Gait Details: Pt with increased right foot external rotation, wide BOS, no gross unsteadiness  Stairs            Wheelchair Mobility    Modified Rankin (Stroke Patients Only)       Balance Overall balance assessment:  Mild deficits observed, not formally tested                                           Pertinent Vitals/Pain Pain Assessment: Faces Faces Pain Scale: Hurts a little bit Pain Location: surgical site Pain Descriptors / Indicators: Operative site guarding Pain Intervention(s): Monitored during session    Home Living Family/patient expects to be discharged to:: Private residence Living Arrangements: Spouse/significant other Available Help at Discharge: Family Type of Home: House Home Access: Stairs to enter Entrance Stairs-Rails: Left(bar he can hold onto on left side) Entrance Stairs-Number of Steps: 1 Home Layout: One level Home Equipment: Environmental consultant - 2 wheels      Prior Function Level of Independence: Independent               Hand Dominance        Extremity/Trunk Assessment   Upper Extremity Assessment Upper Extremity Assessment: Defer to OT evaluation    Lower Extremity Assessment Lower Extremity Assessment: Overall WFL for tasks assessed    Cervical / Trunk Assessment Cervical / Trunk Assessment: Other exceptions Cervical / Trunk Exceptions: s/p L4/5 fusion  Communication   Communication: HOH  Cognition Arousal/Alertness: Awake/alert Behavior During Therapy: WFL for tasks assessed/performed Overall Cognitive Status: Within Functional Limits for tasks assessed                                        General Comments  Exercises     Assessment/Plan    PT Assessment Patent does not need any further PT services  PT Problem List         PT Treatment Interventions      PT Goals (Current goals can be found in the Care Plan section)  Acute Rehab PT Goals Patient Stated Goal: "no pain." PT Goal Formulation: All assessment and education complete, DC therapy    Frequency     Barriers to discharge        Co-evaluation               AM-PAC PT "6 Clicks" Mobility  Outcome Measure Help needed turning from  your back to your side while in a flat bed without using bedrails?: None Help needed moving from lying on your back to sitting on the side of a flat bed without using bedrails?: None Help needed moving to and from a bed to a chair (including a wheelchair)?: None Help needed standing up from a chair using your arms (e.g., wheelchair or bedside chair)?: None Help needed to walk in hospital room?: None Help needed climbing 3-5 steps with a railing? : A Little 6 Click Score: 23    End of Session Equipment Utilized During Treatment: Back brace Activity Tolerance: Patient tolerated treatment well Patient left: Other (comment)(handoff to OT) Nurse Communication: Mobility status PT Visit Diagnosis: Pain;Difficulty in walking, not elsewhere classified (R26.2) Pain - part of body: (back)    Time: [id number] PT Time Calculation (min) (ACUTE ONLY): 21 min   Charges:   PT Evaluation $PT Eval Low Complexity: [place], PT, DPT Acute Rehabilitation Services Pager [phone] Office [phone]   Larry Elliott 12/05/2019, 8:32 AM

## 2019-12-05 NOTE — Evaluation (Addendum)
Occupational Therapy Evaluation and Discharge  Patient Details Name: Larry Elliott MRN: [id number] DOB: 19-May-1946 Today's Date: 12/05/2019    History of Present Illness Pt is a 74 y.o. M with significant PMH of prostate CA who presents s/p L4/5 decompression and fusion.   Clinical Impression   PTA patient independent. Admitted for above and limited by problem list below, including pain, back precautions. Educated on back precautions, ADL compensatory techniques, recommendations, and safety.  Pt completing LB ADLs with min assist, brace mgmt with min assist, toilet transfers with supervision and grooming with supervision.  Patient will have support from family as needed at discharge. No further OT needs identified at this time and OT will sign off.      Follow Up Recommendations  No OT follow up;Supervision - Intermittent    Equipment Recommendations  None recommended by OT    Recommendations for Other Services       Precautions / Restrictions Precautions Precautions: Back Precaution Booklet Issued: Yes (comment) Precaution Comments: Verbally reviewed, provided written handout Required Braces or Orthoses: Spinal Brace Spinal Brace: Thoracolumbosacral orthotic;Applied in sitting position Restrictions Weight Bearing Restrictions: No      Mobility Bed Mobility               General bed mobility comments: OOB with PT upon entry   Transfers Overall transfer level: Needs assistance Equipment used: None Transfers: Sit to/from Stand Sit to Stand: Supervision;Modified independent (Device/Increase time)         General transfer comment: supervision to modified independent for safety     Balance Overall balance assessment: Mild deficits observed, not formally tested                                         ADL either performed or assessed with clinical judgement   ADL Overall ADL's : Needs assistance/impaired     Grooming:  Supervision/safety;Standing   Upper Body Bathing: Set up;Sitting   Lower Body Bathing: Minimal assistance;Sit to/from stand;Sitting/lateral leans;Cueing for compensatory techniques;Cueing for back precautions   Upper Body Dressing : Minimal assistance;Sitting Upper Body Dressing Details (indicate cue type and reason): for brace mgmt  Lower Body Dressing: Minimal assistance;Sit to/from stand;Cueing for compensatory techniques;Cueing for back precautions Lower Body Dressing Details (indicate cue type and reason): limited ability to figure 4 and reach B LEs, assist required to don depends and cueing for technique Toilet Transfer: Ambulation;Modified Independent       Tub/ Shower Transfer: Supervision/safety;Ambulation;Grab bars;Shower Scientist, research (medical) Details (indicate cue type and reason): simulated in room Functional mobility during ADLs: Supervision/safety General ADL Comments: pt educated on back precautions, ADL compensatory techniques and recommendations      Vision         Perception     Praxis      Pertinent Vitals/Pain Pain Assessment: Faces Faces Pain Scale: Hurts a little bit Pain Location: surgical site Pain Descriptors / Indicators: Operative site guarding Pain Intervention(s): Monitored during session;Repositioned     Hand Dominance     Extremity/Trunk Assessment Upper Extremity Assessment Upper Extremity Assessment: Overall WFL for tasks assessed   Lower Extremity Assessment Lower Extremity Assessment: Defer to PT evaluation   Cervical / Trunk Assessment Cervical / Trunk Assessment: Other exceptions Cervical / Trunk Exceptions: s/p L4/5 fusion   Communication Communication Communication: HOH   Cognition Arousal/Alertness: Awake/alert Behavior During Therapy: WFL for tasks assessed/performed Overall Cognitive Status:  Within Functional Limits for tasks assessed                                     General Comments        Exercises     Shoulder Instructions      Home Living Family/patient expects to be discharged to:: Private residence Living Arrangements: Spouse/significant other Available Help at Discharge: Family Type of Home: House Home Access: Stairs to enter Technical brewer of Steps: 1 Entrance Stairs-Rails: Left(bar he can hold onto on left side) Home Layout: One level     Bathroom Shower/Tub: Occupational psychologist: [place]: Environmental consultant - 2 wheels;Shower seat - built in;Grab bars - toilet;Grab bars - tub/shower          Prior Functioning/Environment Level of Independence: Independent                 OT Problem List: Pain;Decreased activity tolerance;Decreased knowledge of use of DME or AE;Decreased knowledge of precautions      OT Treatment/Interventions:      OT Goals(Current goals can be found in the care plan section) Acute Rehab OT Goals Patient Stated Goal: "no pain." OT Goal Formulation: With patient  OT Frequency:     Barriers to D/C:            Co-evaluation              AM-PAC OT "6 Clicks" Daily Activity     Outcome Measure Help from another person eating meals?: None Help from another person taking care of personal grooming?: A Little Help from another person toileting, which includes using toliet, bedpan, or urinal?: A Little Help from another person bathing (including washing, rinsing, drying)?: A Little Help from another person to put on and taking off regular upper body clothing?: A Little Help from another person to put on and taking off regular lower body clothing?: A Little 6 Click Score: 19   End of Session Equipment Utilized During Treatment: Back brace Nurse Communication: Mobility status  Activity Tolerance: Patient tolerated treatment well Patient left: with call bell/phone within reach  OT Visit Diagnosis: Other abnormalities of gait and mobility (R26.89);Pain Pain - part of body: (back)                 Time: [id number] OT Time Calculation (min): 21 min Charges:  OT General Charges $OT Visit: 1 Visit OT Evaluation $OT Eval Low Complexity: 1 Low  Jolaine Artist, OT Acute Rehabilitation Services Pager [phone] Office [phone]    Delight Stare 12/05/2019, 8:46 AM

## 2019-12-05 NOTE — Care Management Obs Status (Signed)
MEDICARE OBSERVATION STATUS NOTIFICATION   Patient Details  Name: BRINLEY LEINER MRN: [id number] Date of Birth: October 07, 1946   Medicare Observation Status Notification Given:  Yes    Ella Bodo, RN 12/05/2019, 10:24 AM

## 2019-12-05 NOTE — Care Management CC44 (Signed)
Condition Code 44 Documentation Completed  Patient Details  Name: Larry Elliott MRN: [id number] Date of Birth: 1945-12-03   Condition Code 44 given:  Yes Patient signature on Condition Code 44 notice:  Yes Documentation of 2 MD's agreement:  Yes Code 44 added to claim:  Yes    Ella Bodo, RN 12/05/2019, 10:25 AM

## 2019-12-05 NOTE — Progress Notes (Signed)
    Patient doing well Patient denies leg pain Minimal back pain   Physical Exam: Vitals:   12/04/19 2328 12/05/19 0423  BP: (!) 107/57 121/67  Pulse: 69 74  Resp: 18 20  Temp: 98.5 F (36.9 C) 98 F (36.7 C)  SpO2: 93% 91%    Dressing in place Patient looks excellent NVI  POD #1 s/p L4/5 decompression and fusion  - up with PT/OT, encourage ambulation - Percocet for pain, Robaxin for muscle spasms - d/c home today with f/u in 2 weeks

## 2019-12-08 ENCOUNTER — Encounter: Payer: Self-pay | Admitting: Internal Medicine

## 2019-12-09 ENCOUNTER — Other Ambulatory Visit: Payer: Self-pay | Admitting: Internal Medicine

## 2019-12-09 ENCOUNTER — Encounter: Payer: Self-pay | Admitting: *Deleted

## 2019-12-09 MED ORDER — CLORAZEPATE DIPOTASSIUM 7.5 MG PO TABS: 7.5000 mg | ORAL_TABLET | Freq: Every evening | ORAL | 0 refills | Status: DC | PRN

## 2019-12-09 MED FILL — Heparin Sodium (Porcine) Inj 1000 Unit/ML: INTRAMUSCULAR | Qty: 30 | Status: AC

## 2019-12-09 MED FILL — Sodium Chloride IV Soln 0.9%: INTRAVENOUS | Qty: 1000 | Status: AC

## 2019-12-12 NOTE — Discharge Summary (Signed)
Patient ID: Larry Elliott MRN: [id number] DOB/AGE: [birth year] 74 y.o.  Admit date: 12/04/2019 Discharge date: 12/05/2019  Admission Diagnoses:  Active Problems:   Radiculopathy   Discharge Diagnoses:  Same  Past Medical History:  Diagnosis Date  . Anxiety and depression 11/04/2013  . Arthritis   . BCC (basal cell carcinoma), leg    Right calf, L nape of neck at hairline  . CAD (coronary artery disease)    CP, cath, non-obstructive  . Diverticulosis    colon  . Erectile dysfunction following radical prostatectomy   . Hyperlipidemia   . Hypertension   . Insomnia   . Melanoma ([place]) 2010   face, sees derm   . Prostate CA ([place]) 10/08   s/p robotic surgery  . Spermatocele    Left    Surgeries: Procedure(s): RIGHT-SIDED TRANSFORAMINAL LUMBAR INTERBODY FUSION LUMBAR FOUR THROUGH FIVE WITH INSTRUMENTATION AND ALLOGRAFT on 12/04/2019   Consultants: None  Discharged Condition: Improved  Hospital Course: Larry Elliott is an 74 y.o. male who was admitted 12/04/2019 for operative treatment of radiculopathy. Patient has severe unremitting pain that affects sleep, daily activities, and work/hobbies. After pre-op clearance the patient was taken to the operating room on 12/04/2019 and underwent  Procedure(s): RIGHT-SIDED TRANSFORAMINAL LUMBAR INTERBODY FUSION LUMBAR FOUR THROUGH FIVE WITH INSTRUMENTATION AND ALLOGRAFT.    Patient was given perioperative antibiotics:  Anti-infectives (From admission, onward)   Start     Dose/Rate Route Frequency Ordered Stop   12/04/19 2200  vancomycin (VANCOREADY) IVPB 1500 mg/300 mL     1,500 mg 150 mL/hr over 120 Minutes Intravenous  Once 12/04/19 1805 12/05/19 0852   12/04/19 1200  vancomycin (VANCOREADY) IVPB 1500 mg/300 mL     1,500 mg 150 mL/hr over 120 Minutes Intravenous To ShortStay [facility] 12/03/19 1148 12/04/19 1145       Patient was given sequential compression devices, early ambulation to prevent DVT.  Patient benefited  maximally from hospital stay and there were no complications.    Recent vital signs: BP 132/78 (BP Location: Left Arm)   Pulse (!) 101   Temp 98.3 F (36.8 C) (Oral)   Resp 16   Ht 5\' 10"  (1.778 m)   Wt 104.3 kg   SpO2 93%   BMI 33.00 kg/m    Discharge Medications:   Allergies as of 12/05/2019      Reactions   Aspirin Other (See Comments)   REACTION: swelling in face   Hydrocodone    Insomnia, mild SOB (w/o cough-wheezing)   Penicillins Other (See Comments)   REACTION: swelling in face Did it involve swelling of the face/tongue/throat, SOB, or low BP? Yes Did it involve sudden or severe rash/hives, skin peeling, or any reaction on the inside of your mouth or nose? No Did you need to seek medical attention at a hospital or doctor's office? Yes When did it last happen?[id number] played Hockey If all above answers are "NO", may proceed with cephalosporin use.      Medication List    TAKE these medications   atorvastatin 40 MG tablet Commonly known as: LIPITOR Take 1 tablet (40 mg total) by mouth at bedtime.   cyclobenzaprine 5 MG tablet Commonly known as: FLEXERIL Take 5 mg by mouth at bedtime as needed for muscle spasms.   diphenhydrAMINE 25 MG tablet Commonly known as: BENADRYL Take 25 mg by mouth at bedtime.   diphenhydramine-acetaminophen 25-500 MG Tabs tablet Commonly known as: TYLENOL PM Take 1 tablet by mouth at bedtime  as needed.   doxylamine (Sleep) 25 MG tablet Commonly known as: UNISOM Take 25 mg by mouth at bedtime.   enalapril 5 MG tablet Commonly known as: VASOTEC Take 1 tablet (5 mg total) by mouth daily.   FIBER LAXATIVE PO Take 1 tablet by mouth daily.   hydrochlorothiazide 25 MG tablet Commonly known as: HYDRODIURIL Take 1 tablet (25 mg total) by mouth daily.   Melatonin 10 MG Tabs Take 10 mg by mouth at bedtime.   methocarbamol 500 MG tablet Commonly known as: ROBAXIN Take 500 mg by mouth every 6 (six) hours as needed for muscle  spasms.   multivitamin per tablet Take 1 tablet by mouth daily.   oxyCODONE-acetaminophen 5-325 MG tablet Commonly known as: PERCOCET/ROXICET Take 1-2 tablets by mouth every 4 (four) hours as needed for moderate pain or severe pain.   traZODone 100 MG tablet Commonly known as: DESYREL Take 2 tablets (200 mg total) by mouth at bedtime.   vitamin E 45 MG (100 UNITS) capsule Take 100 Units by mouth daily.       Diagnostic Studies: DG Lumbar Spine 2-3 Views  Result Date: 12/04/2019 CLINICAL DATA:  L4/L5 posterior fusion and discectomy EXAM: LUMBAR SPINE - 2-3 VIEW; DG C-ARM 1-60 MIN COMPARISON:  12/04/2019 FINDINGS: Two fluoroscopic images were obtained during the performance of the procedure and are provided for interpretation only. Images demonstrate L4/L5 discectomy and posterior fusion. Alignment is anatomic. FLUOROSCOPY TIME:  29 seconds IMPRESSION: 1. L4/L5 discectomy and posterior fusion as above. Electronically Signed   By: Randa Ngo M.D.   On: 12/04/2019 16:07   DG Lumbar Spine 1 View  Result Date: 12/04/2019 CLINICAL DATA:  Surgical localization. EXAM: LUMBAR SPINE - 1 VIEW COMPARISON:  MRI of October 21, 2019. FINDINGS: Single intraoperative cross-table lateral projection was obtained of the lumbar spine. This image demonstrates surgical probes directed toward the posterior spinous process is of L2 and L4. IMPRESSION: Surgical localization as described above. Electronically Signed   By: Marijo Conception M.D.   On: 12/04/2019 13:29   DG C-Arm 1-60 Min  Result Date: 12/04/2019 CLINICAL DATA:  L4/L5 posterior fusion and discectomy EXAM: LUMBAR SPINE - 2-3 VIEW; DG C-ARM 1-60 MIN COMPARISON:  12/04/2019 FINDINGS: Two fluoroscopic images were obtained during the performance of the procedure and are provided for interpretation only. Images demonstrate L4/L5 discectomy and posterior fusion. Alignment is anatomic. FLUOROSCOPY TIME:  29 seconds IMPRESSION: 1. L4/L5 discectomy and  posterior fusion as above. Electronically Signed   By: Randa Ngo M.D.   On: 12/04/2019 16:07    Disposition: Discharge disposition: 01-Home or Self Care        POD #1 s/p L4/5 decompression and fusion  - up with PT/OT, encourage ambulation - Percocet for pain, Robaxin for muscle spasms -Scripts for pain sent to pharmacy electronically  -D/C instructions sheet printed and in chart -D/C today  -F/U in office 2 weeks   Signed: Lennie Muckle Ariyannah Pauling 12/12/2019, 11:44 AM

## 2019-12-13 ENCOUNTER — Encounter: Payer: Self-pay | Admitting: Internal Medicine

## 2019-12-16 NOTE — Telephone Encounter (Signed)
LMOM to see if Pt wants to pick up handicap placard or mail them.

## 2019-12-18 ENCOUNTER — Encounter: Payer: Self-pay | Admitting: Internal Medicine

## 2019-12-19 ENCOUNTER — Other Ambulatory Visit: Payer: Self-pay

## 2019-12-23 ENCOUNTER — Ambulatory Visit: Payer: Medicare Other | Attending: Internal Medicine

## 2019-12-23 ENCOUNTER — Ambulatory Visit (INDEPENDENT_AMBULATORY_CARE_PROVIDER_SITE_OTHER): Payer: Medicare Other | Admitting: Internal Medicine

## 2019-12-23 ENCOUNTER — Encounter: Payer: Self-pay | Admitting: Internal Medicine

## 2019-12-23 ENCOUNTER — Ambulatory Visit: Payer: Medicare Other | Admitting: Internal Medicine

## 2019-12-23 ENCOUNTER — Other Ambulatory Visit: Payer: Self-pay

## 2019-12-23 VITALS — BP 137/81 | HR 84 | Temp 96.7°F | Resp 16 | Ht 70.0 in | Wt 227.4 lb

## 2019-12-23 DIAGNOSIS — Z23 Encounter for immunization: Secondary | ICD-10-CM | POA: Insufficient documentation

## 2019-12-23 DIAGNOSIS — M7989 Other specified soft tissue disorders: Secondary | ICD-10-CM | POA: Diagnosis not present

## 2019-12-23 NOTE — Progress Notes (Signed)
   Covid-19 Vaccination Clinic  Name:  TIMMOTHY NONNEMACHER    MRN: [id number] DOB: 1945-11-23  12/23/2019  Mr. Stavis was observed post Covid-19 immunization for 15 minutes without incidence. He was provided with Vaccine Information Sheet and instruction to access the V-Safe system.   Mr. Testerman was instructed to call 911 with any severe reactions post vaccine: Marland Kitchen Difficulty breathing  . Swelling of your face and throat  . A fast heartbeat  . A bad rash all over your body  . Dizziness and weakness    Immunizations Administered    Name Date Dose VIS Date Route   Pfizer COVID-19 Vaccine 12/23/2019 11:42 AM 0.3 mL 10/04/2019 Intramuscular   Manufacturer: [place]   Lot: [id number]   [place]: [id number]

## 2019-12-23 NOTE — Patient Instructions (Signed)
    STOP BY THE FIRST FLOOR:  Please arrange for ultrasound on your right leg  Keep the legs elevated  Call if not gradually better

## 2019-12-23 NOTE — Progress Notes (Signed)
Subjective:    Patient ID: Larry Elliott, male    DOB: Sep 19, 1946, 74 y.o.   MRN: [id number]  DOS:  12/23/2019 Type of visit - description: Acute  C/o  lower extremity edema, worse on the right leg, sxs started after he had back surgery 12/04/2019. The surgery actually went well, back pain and leg pain improved. Today for the first time the legs started to " look normal", meaning the swelling has decreased  Wt Readings from Last 3 Encounters:  12/23/19 227 lb 6 oz (103.1 kg)  12/04/19 230 lb (104.3 kg)  11/27/19 228 lb 8 oz (103.6 kg)   Review of Systems Denies fever chills No chest pain no difficulty breathing   Past Medical History:  Diagnosis Date  . Anxiety and depression 11/04/2013  . Arthritis   . BCC (basal cell carcinoma), leg    Right calf, L nape of neck at hairline  . CAD (coronary artery disease)    CP, cath, non-obstructive  . Diverticulosis    colon  . Erectile dysfunction following radical prostatectomy   . Hyperlipidemia   . Hypertension   . Insomnia   . Melanoma ([place]) 2010   face, sees derm   . Prostate CA ([place]) 10/08   s/p robotic surgery  . Spermatocele    Left    Past Surgical History:  Procedure Laterality Date  . APPENDECTOMY    . HERNIA REPAIR    . KNEE ARTHROSCOPY Left    meniscal tear  . PROSTATECTOMY  07-2007    robotic,  for prostate cancer    . SHOULDER ARTHROSCOPY  07-09-2013   Dr Tamera Punt   . TRANSFORAMINAL LUMBAR INTERBODY FUSION (TLIF) WITH PEDICLE SCREW FIXATION 1 LEVEL Right 12/04/2019   Procedure: RIGHT-SIDED TRANSFORAMINAL LUMBAR INTERBODY FUSION LUMBAR FOUR THROUGH FIVE WITH INSTRUMENTATION AND ALLOGRAFT;  Surgeon: Phylliss Bob, MD;  Location: [facility];  Service: Orthopedics;  Laterality: Right;    Allergies as of 12/23/2019      Reactions   Aspirin Other (See Comments)   REACTION: swelling in face   Hydrocodone    Insomnia, mild SOB (w/o cough-wheezing)   Penicillins Other (See Comments)   REACTION: swelling in face  Did it involve swelling of the face/tongue/throat, SOB, or low BP? Yes Did it involve sudden or severe rash/hives, skin peeling, or any reaction on the inside of your mouth or nose? No Did you need to seek medical attention at a hospital or doctor's office? Yes When did it last happen?[id number] played Hockey If all above answers are "NO", may proceed with cephalosporin use.      Medication List       Accurate as of December 23, 2019  2:24 PM. If you have any questions, ask your nurse or doctor.        atorvastatin 40 MG tablet Commonly known as: LIPITOR Take 1 tablet (40 mg total) by mouth at bedtime.   clorazepate 7.5 MG tablet Commonly known as: TRANXENE Take 1 tablet (7.5 mg total) by mouth at bedtime as needed for anxiety.   cyclobenzaprine 5 MG tablet Commonly known as: FLEXERIL Take 5 mg by mouth at bedtime as needed for muscle spasms.   diphenhydrAMINE 25 MG tablet Commonly known as: BENADRYL Take 25 mg by mouth at bedtime.   diphenhydramine-acetaminophen 25-500 MG Tabs tablet Commonly known as: TYLENOL PM Take 1 tablet by mouth at bedtime as needed.   doxylamine (Sleep) 25 MG tablet Commonly known as: UNISOM Take 25 mg by mouth  at bedtime.   enalapril 5 MG tablet Commonly known as: VASOTEC Take 1 tablet (5 mg total) by mouth daily.   FIBER LAXATIVE PO Take 1 tablet by mouth daily.   hydrochlorothiazide 25 MG tablet Commonly known as: HYDRODIURIL Take 1 tablet (25 mg total) by mouth daily.   Melatonin 10 MG Tabs Take 10 mg by mouth at bedtime.   methocarbamol 500 MG tablet Commonly known as: ROBAXIN Take 500 mg by mouth every 6 (six) hours as needed for muscle spasms.   multivitamin per tablet Take 1 tablet by mouth daily.   oxyCODONE-acetaminophen 5-325 MG tablet Commonly known as: PERCOCET/ROXICET Take 1-2 tablets by mouth every 4 (four) hours as needed for moderate pain or severe pain.   traZODone 100 MG tablet Commonly known as: DESYREL  Take 2 tablets (200 mg total) by mouth at bedtime.   vitamin E 45 MG (100 UNITS) capsule Take 100 Units by mouth daily.         Objective:   Physical Exam BP 137/81 (BP Location: Left Arm, Patient Position: Sitting, Cuff Size: Normal)   Pulse 84   Temp (!) 96.7 F (35.9 C) (Temporal)   Resp 16   Ht 5\' 10"  (1.778 m)   Wt 227 lb 6 oz (103.1 kg) Comment: w/ back brace  SpO2 97%   BMI 32.62 kg/m  General:   Well developed, NAD, BMI noted. HEENT:  Normocephalic . Face symmetric, atraumatic lungs:  CTA B Normal respiratory effort, no intercostal retractions, no accessory muscle use. Heart: RRR,  no murmur.  Lower extremities:  Calves symmetric and nontender. More distally, there is some swelling on the right leg near the ankle. Skin: Not pale. Not jaundice Neurologic:  alert & oriented X3.  Speech normal, gait appropriate for age, unassisted, slightly limited by back brace Psych--  Cognition and judgment appear intact.  Cooperative with normal attention span and concentration.  Behavior appropriate. No anxious or depressed appearing.      Assessment      Assessment  HTN Hyperlipidemia Anxiety depression insomnia  dc clonazepam d/t not being effective,  8-16, rx trazodone CAD, non-obstructive Prostate cancer-- 2008, robotic surgery, no incontinence, Dr Alinda Money: now f/u per PCP as of 05-2018 [facility] ED past surgery  spermatocele, left Skin --Melanoma 2010, BCCs sees derm LE edema R>L (-) US DVT 2020  PLAN: Lower extremity edema, R>L. In the context of recent surgery and decreased mobilization, will get a ultrasound to rule out DVT. If negative, will recommend increase physical activity gradually as recommended by surgery and leg elevation. Leg pain: Since the last visit, source of pain was determined to be his back, status post extensive back surgery, doing well.  This visit occurred during the SARS-CoV-2 public health emergency.  Safety protocols were in place,  including screening questions prior to the visit, additional usage of staff PPE, and extensive cleaning of exam room while observing appropriate contact time as indicated for disinfecting solutions.

## 2019-12-23 NOTE — Progress Notes (Signed)
Pre visit review using our clinic review tool, if applicable. No additional management support is needed unless otherwise documented below in the visit note. 

## 2019-12-24 ENCOUNTER — Ambulatory Visit (HOSPITAL_BASED_OUTPATIENT_CLINIC_OR_DEPARTMENT_OTHER)
Admission: RE | Admit: 2019-12-24 | Discharge: 2019-12-24 | Disposition: A | Discharge status: Home / Self Care | Payer: Medicare Other | Source: Ambulatory Visit | Attending: Internal Medicine | Admitting: Internal Medicine

## 2019-12-24 DIAGNOSIS — M7989 Other specified soft tissue disorders: Secondary | ICD-10-CM | POA: Insufficient documentation

## 2019-12-24 NOTE — Assessment & Plan Note (Signed)
Lower extremity edema, R>L. In the context of recent surgery and decreased mobilization, will get a ultrasound to rule out DVT. If negative, will recommend increase physical activity gradually as recommended by surgery and leg elevation. Leg pain: Since the last visit, source of pain was determined to be his back, status post extensive back surgery, doing well.

## 2020-01-02 ENCOUNTER — Other Ambulatory Visit: Payer: Self-pay | Admitting: Internal Medicine

## 2020-01-03 MED ORDER — CLORAZEPATE DIPOTASSIUM 7.5 MG PO TABS: 7.5000 mg | ORAL_TABLET | Freq: Every evening | ORAL | 2 refills | Status: DC | PRN

## 2020-01-03 NOTE — Telephone Encounter (Signed)
Requesting:tranxene Contract:no UDS:n/a Last OV:12/23/19 Next OV:04/16/20 Last Refill:12/09/19  #[id number] Database:   Please advise

## 2020-01-03 NOTE — Telephone Encounter (Signed)
Received Lovastatin request. Pt's current med list shows atorvastatin is his current cholesterol medication. Attempted to contact pharmacy but they do not open until 10am today. Attempted to reach pt to verify what he is taking at home and left message for pt to return my call.

## 2020-01-03 NOTE — Telephone Encounter (Signed)
PDMP okay, Rx sent 

## 2020-01-14 DIAGNOSIS — Z9889 Other specified postprocedural states: Secondary | ICD-10-CM | POA: Diagnosis not present

## 2020-01-14 DIAGNOSIS — M5416 Radiculopathy, lumbar region: Secondary | ICD-10-CM | POA: Diagnosis not present

## 2020-01-21 ENCOUNTER — Ambulatory Visit: Payer: Medicare Other | Attending: Internal Medicine

## 2020-01-21 DIAGNOSIS — Z23 Encounter for immunization: Secondary | ICD-10-CM

## 2020-01-21 NOTE — Progress Notes (Signed)
   Covid-19 Vaccination Clinic  Name:  Larry Elliott    MRN: [id number] DOB: January 06, 1946  01/21/2020  Larry Elliott was observed post Covid-19 immunization for 15 minutes without incident. He was provided with Vaccine Information Sheet and instruction to access the V-Safe system.   Larry Elliott was instructed to call 911 with any severe reactions post vaccine: Marland Kitchen Difficulty breathing  . Swelling of face and throat  . A fast heartbeat  . A bad rash all over body  . Dizziness and weakness   Immunizations Administered    Name Date Dose VIS Date Route   Pfizer COVID-19 Vaccine 01/21/2020 11:58 AM 0.3 mL 10/04/2019 Intramuscular   Manufacturer: [place]   Lot: [id number]   [place]: [id number]

## 2020-02-25 DIAGNOSIS — Z9889 Other specified postprocedural states: Secondary | ICD-10-CM | POA: Diagnosis not present

## 2020-02-25 DIAGNOSIS — M5416 Radiculopathy, lumbar region: Secondary | ICD-10-CM | POA: Diagnosis not present

## 2020-02-26 ENCOUNTER — Other Ambulatory Visit: Payer: Self-pay | Admitting: Internal Medicine

## 2020-03-19 ENCOUNTER — Other Ambulatory Visit: Payer: Self-pay | Admitting: Internal Medicine

## 2020-04-11 ENCOUNTER — Telehealth: Payer: Self-pay | Admitting: Internal Medicine

## 2020-04-13 NOTE — Telephone Encounter (Signed)
PDMP okay, Rx sent 

## 2020-04-13 NOTE — Telephone Encounter (Signed)
Clorazepate refill.   Last OV: 12/23/2019 Last Fill: 01/03/2020 #30 and 2RF Pt sig: 1 tab qhs prn UDS: None

## 2020-04-16 ENCOUNTER — Encounter: Payer: Self-pay | Admitting: Internal Medicine

## 2020-04-16 ENCOUNTER — Other Ambulatory Visit: Payer: Self-pay

## 2020-04-16 ENCOUNTER — Ambulatory Visit (INDEPENDENT_AMBULATORY_CARE_PROVIDER_SITE_OTHER): Payer: Medicare Other | Admitting: Internal Medicine

## 2020-04-16 VITALS — BP 114/77 | HR 80 | Temp 97.5°F | Resp 18 | Ht 70.0 in | Wt 226.5 lb

## 2020-04-16 DIAGNOSIS — Z79899 Other long term (current) drug therapy: Secondary | ICD-10-CM | POA: Diagnosis not present

## 2020-04-16 DIAGNOSIS — G4700 Insomnia, unspecified: Secondary | ICD-10-CM | POA: Diagnosis not present

## 2020-04-16 DIAGNOSIS — I10 Essential (primary) hypertension: Secondary | ICD-10-CM | POA: Diagnosis not present

## 2020-04-16 DIAGNOSIS — F419 Anxiety disorder, unspecified: Secondary | ICD-10-CM | POA: Diagnosis not present

## 2020-04-16 DIAGNOSIS — F329 Major depressive disorder, single episode, unspecified: Secondary | ICD-10-CM | POA: Diagnosis not present

## 2020-04-16 NOTE — Patient Instructions (Addendum)
Please schedule Medicare Wellness with Larry Elliott.    For difficulty sleeping and anxiety, only take Tranxene and trazodone. Do not take Unisom, Tylenol PM or Benadryl.  I mistakenly sent a prescription forLovastatin or Mevacor.  If that is sent to you from the pharmacy please do not take it, continue atorvastatin.   GO TO THE LAB : Get the blood work     [place] back for a checkup in 3 months

## 2020-04-16 NOTE — Progress Notes (Signed)
Subjective:    Patient ID: Larry Elliott, male    DOB: 1946/04/24, 74 y.o.   MRN: [id number]  DOS:  04/16/2020 Type of visit - description: Routine follow-up Feeling well. We talk about his recent surgery, he is doing great. We also talk about anxiety and insomnia.    Review of Systems See above   Past Medical History:  Diagnosis Date   Anxiety and depression 11/04/2013   Arthritis    BCC (basal cell carcinoma), leg    Right calf, L nape of neck at hairline   CAD (coronary artery disease)    CP, cath, non-obstructive   Diverticulosis    colon   Erectile dysfunction following radical prostatectomy    Hyperlipidemia    Hypertension    Insomnia    Melanoma ([place]) 2010   face, sees derm    Prostate CA ([place]) 10/08   s/p robotic surgery   Spermatocele    Left    Past Surgical History:  Procedure Laterality Date   APPENDECTOMY     HERNIA REPAIR     KNEE ARTHROSCOPY Left    meniscal tear   PROSTATECTOMY  07-2007    robotic,  for prostate cancer     SHOULDER ARTHROSCOPY  07-09-2013   Dr Tamera Punt    TRANSFORAMINAL LUMBAR INTERBODY FUSION (TLIF) WITH PEDICLE SCREW FIXATION 1 LEVEL Right 12/04/2019   Procedure: RIGHT-SIDED TRANSFORAMINAL LUMBAR INTERBODY FUSION LUMBAR FOUR THROUGH FIVE WITH INSTRUMENTATION AND ALLOGRAFT;  Surgeon: Phylliss Bob, MD;  Location: [facility];  Service: Orthopedics;  Laterality: Right;    Allergies as of 04/16/2020      Reactions   Aspirin Other (See Comments)   REACTION: swelling in face   Hydrocodone    Insomnia, mild SOB (w/o cough-wheezing)   Penicillins Other (See Comments)   REACTION: swelling in face Did it involve swelling of the face/tongue/throat, SOB, or low BP? Yes Did it involve sudden or severe rash/hives, skin peeling, or any reaction on the inside of your mouth or nose? No Did you need to seek medical attention at a hospital or doctor's office? Yes When did it last happen?[id number] played Hockey If all  above answers are NO, may proceed with cephalosporin use.      Medication List       Accurate as of April 16, 2020 11:59 PM. If you have any questions, ask your nurse or doctor.        STOP taking these medications   diphenhydrAMINE 25 MG tablet Commonly known as: BENADRYL Stopped by: Bishop Dublin, CMA   diphenhydramine-acetaminophen 25-500 MG Tabs tablet Commonly known as: TYLENOL PM Stopped by: Bishop Dublin, CMA   doxylamine (Sleep) 25 MG tablet Commonly known as: UNISOM Stopped by: Bishop Dublin, CMA   lovastatin 20 MG tablet Commonly known as: MEVACOR Stopped by: Bishop Dublin, CMA   oxyCODONE-acetaminophen 5-325 MG tablet Commonly known as: PERCOCET/ROXICET Stopped by: Kathlene November, MD     TAKE these medications   atorvastatin 40 MG tablet Commonly known as: LIPITOR Take 1 tablet (40 mg total) by mouth at bedtime.   clorazepate 7.5 MG tablet Commonly known as: TRANXENE TAKE ONE TABLET BY MOUTH AT BEDTIME AS NEEDED FOR ANXIETY   cyclobenzaprine 5 MG tablet Commonly known as: FLEXERIL Take 5 mg by mouth at bedtime as needed for muscle spasms.   enalapril 5 MG tablet Commonly known as: VASOTEC Take 1 tablet (5 mg total) by mouth daily.   FIBER LAXATIVE PO Take  1 tablet by mouth daily.   hydrochlorothiazide 25 MG tablet Commonly known as: HYDRODIURIL Take 1 tablet (25 mg total) by mouth daily.   Melatonin 10 MG Tabs Take 10 mg by mouth at bedtime.   methocarbamol 500 MG tablet Commonly known as: ROBAXIN Take 500 mg by mouth every 6 (six) hours as needed for muscle spasms.   multivitamin per tablet Take 1 tablet by mouth daily.   traZODone 100 MG tablet Commonly known as: DESYREL Take 2 tablets (200 mg total) by mouth at bedtime.   vitamin E 45 MG (100 UNITS) capsule Take 100 Units by mouth daily.          Objective:   Physical Exam BP 114/77 (BP Location: Left Arm, Patient Position: Sitting, Cuff Size: Normal)    Pulse 80    Temp  (!) 97.5 F (36.4 C) (Temporal)    Resp 18    Ht 5\' 10"  (1.778 m)    Wt 226 lb 8 oz (102.7 kg)    SpO2 93%    BMI 32.50 kg/m  General:   Well developed, NAD, BMI noted. HEENT:  Normocephalic . Face symmetric, atraumatic Lungs:  CTA B Normal respiratory effort, no intercostal retractions, no accessory muscle use. Heart: RRR,  no murmur.  Lower extremities: no pretibial edema bilaterally  Skin: Not pale. Not jaundice Neurologic:  alert & oriented X3.  Speech normal, gait appropriate for age and unassisted Psych--  Cognition and judgment appear intact.  Cooperative with normal attention span and concentration.  Behavior appropriate. No anxious or depressed appearing.      Assessment      Assessment  HTN Hyperlipidemia Anxiety depression insomnia  dc clonazepam d/t not being effective,  8-16, rx trazodone + tranxene  CAD, non-obstructive Prostate cancer-- 2008, robotic surgery, no incontinence, Dr Alinda Money: now f/u per PCP as of 05-2018 [facility] ED past surgery  spermatocele, left Skin --Melanoma 2010, BCCs sees derm LE edema R>L (-) US DVT 2020 ABI 09/2019 WNL   PLAN: HTN: Seems well controlled, continue Vasotec, HCTZ.  Check CMP Hyperlipidemia: On Lipitor, controlled.  (I  sent lovastatin, I think it was an error, will call the pharmacy and cancel the order). Anxiety, depression, insomnia: med list includes Tranxene, trazodone -- but also Unisom, Tylenol PM, Benadryl are on his list. Recommend to only take Tranxene and trazodone, evidently that is what he is requiring to sleep well and reports no excessive somnolence.  Check a UDS. [facility], MSK: Had back surgery, since then, leg pain, completely resolved.  His medication list includes Robaxin and Flexeril, recommend not to take both. RTC 3 months    this visit occurred during the SARS-CoV-2 public health emergency.  Safety protocols were in place, including screening questions prior to the visit, additional usage of staff PPE, and  extensive cleaning of exam room while observing appropriate contact time as indicated for disinfecting solutions.

## 2020-04-16 NOTE — Progress Notes (Signed)
Pre visit review using our clinic review tool, if applicable. No additional management support is needed unless otherwise documented below in the visit note. 

## 2020-04-17 LAB — COMPREHENSIVE METABOLIC PANEL
ALT: 58 U/L — ABNORMAL HIGH (ref 0–53)
AST: 28 U/L (ref 0–37)
Albumin: 4.4 g/dL (ref 3.5–5.2)
Alkaline Phosphatase: 94 U/L (ref 39–117)
BUN: 29 mg/dL — ABNORMAL HIGH (ref 6–23)
CO2: 29 mEq/L (ref 19–32)
Calcium: 10.2 mg/dL (ref 8.4–10.5)
Chloride: 106 mEq/L (ref 96–112)
Creatinine, Ser: 0.76 mg/dL (ref 0.40–1.50)
GFR: 99.6 mL/min (ref >60.00)
Glucose, Bld: 109 mg/dL — ABNORMAL HIGH (ref 70–99)
Potassium: 4 mEq/L (ref 3.5–5.1)
Sodium: 140 mEq/L (ref 135–145)
Total Bilirubin: 0.8 mg/dL (ref 0.2–1.2)
Total Protein: 6.8 g/dL (ref 6.0–8.3)

## 2020-04-18 NOTE — Assessment & Plan Note (Signed)
HTN: Seems well controlled, continue Vasotec, HCTZ.  Check CMP Hyperlipidemia: On Lipitor, controlled.  (I  sent lovastatin, I think it was an error, will call the pharmacy and cancel the order). Anxiety, depression, insomnia: med list includes Tranxene, trazodone -- but also Unisom, Tylenol PM, Benadryl are on his list. Recommend to only take Tranxene and trazodone, evidently that is what he is requiring to sleep well and reports no excessive somnolence.  Check a UDS. [facility], MSK: Had back surgery, since then, leg pain, completely resolved.  His medication list includes Robaxin and Flexeril, recommend not to take both. RTC 3 months

## 2020-04-19 LAB — DM TEMPLATE

## 2020-04-19 LAB — DRUG MONITORING, PANEL 8 WITH CONFIRMATION, URINE
6 Acetylmorphine: NEGATIVE ng/mL (ref <10)
Alcohol Metabolites: NEGATIVE ng/mL
Alphahydroxyalprazolam: NEGATIVE ng/mL (ref <25)
Alphahydroxymidazolam: NEGATIVE ng/mL (ref <50)
Alphahydroxytriazolam: NEGATIVE ng/mL (ref <50)
Aminoclonazepam: NEGATIVE ng/mL (ref <25)
Amphetamines: NEGATIVE ng/mL (ref <500)
Benzodiazepines: POSITIVE ng/mL — AB (ref <100)
Buprenorphine, Urine: NEGATIVE ng/mL (ref <5)
Cocaine Metabolite: NEGATIVE ng/mL (ref <150)
Creatinine: 67.7 mg/dL
Hydroxyethylflurazepam: NEGATIVE ng/mL (ref <50)
Lorazepam: NEGATIVE ng/mL (ref <50)
MDMA: NEGATIVE ng/mL (ref <500)
Marijuana Metabolite: NEGATIVE ng/mL (ref <20)
Nordiazepam: NEGATIVE ng/mL (ref <50)
Opiates: NEGATIVE ng/mL (ref <100)
Oxazepam: 505 ng/mL — ABNORMAL HIGH (ref <50)
Oxidant: NEGATIVE ug/mL
Oxycodone: NEGATIVE ng/mL (ref <100)
Temazepam: NEGATIVE ng/mL (ref <50)
pH: 5.6 (ref 4.5–9.0)

## 2020-05-25 DIAGNOSIS — M545 Low back pain: Secondary | ICD-10-CM | POA: Diagnosis not present

## 2020-07-01 ENCOUNTER — Encounter: Payer: Self-pay | Admitting: Internal Medicine

## 2020-07-06 ENCOUNTER — Encounter: Payer: Self-pay | Admitting: Internal Medicine

## 2020-07-14 ENCOUNTER — Encounter: Payer: Self-pay | Admitting: Internal Medicine

## 2020-07-17 ENCOUNTER — Ambulatory Visit: Payer: Medicare Other | Admitting: Internal Medicine

## 2020-07-28 ENCOUNTER — Other Ambulatory Visit: Payer: Self-pay | Admitting: Internal Medicine

## 2020-08-03 ENCOUNTER — Other Ambulatory Visit: Payer: Self-pay

## 2020-08-03 ENCOUNTER — Ambulatory Visit (INDEPENDENT_AMBULATORY_CARE_PROVIDER_SITE_OTHER): Payer: Medicare Other | Admitting: Internal Medicine

## 2020-08-03 ENCOUNTER — Encounter: Payer: Self-pay | Admitting: Internal Medicine

## 2020-08-03 VITALS — BP 117/80 | HR 87 | Temp 97.6°F | Resp 18 | Ht 70.0 in | Wt 222.1 lb

## 2020-08-03 DIAGNOSIS — F32A Depression, unspecified: Secondary | ICD-10-CM

## 2020-08-03 DIAGNOSIS — Z23 Encounter for immunization: Secondary | ICD-10-CM | POA: Diagnosis not present

## 2020-08-03 DIAGNOSIS — F419 Anxiety disorder, unspecified: Secondary | ICD-10-CM | POA: Diagnosis not present

## 2020-08-03 DIAGNOSIS — I10 Essential (primary) hypertension: Secondary | ICD-10-CM | POA: Diagnosis not present

## 2020-08-03 NOTE — Progress Notes (Signed)
Subjective:    Patient ID: Larry Elliott, male    DOB: [birth year], 74 y.o.   MRN: [id number]  DOS:  08/03/2020 Type of visit - description: Follow-up  Since the last office visit he is doing well. Medication list & recent labs reviewed.   Review of Systems See above   Past Medical History:  Diagnosis Date  . Anxiety and depression 11/04/2013  . Arthritis   . BCC (basal cell carcinoma), leg    Right calf, L nape of neck at hairline  . CAD (coronary artery disease)    CP, cath, non-obstructive  . Diverticulosis    colon  . Erectile dysfunction following radical prostatectomy   . Hyperlipidemia   . Hypertension   . Insomnia   . Melanoma ([place]) 2010   face, sees derm   . Prostate CA ([place]) 10/08   s/p robotic surgery  . Spermatocele    Left    Past Surgical History:  Procedure Laterality Date  . APPENDECTOMY    . HERNIA REPAIR    . KNEE ARTHROSCOPY Left    meniscal tear  . PROSTATECTOMY  07-2007    robotic,  for prostate cancer    . SHOULDER ARTHROSCOPY  07-09-2013   Dr Tamera Punt   . TRANSFORAMINAL LUMBAR INTERBODY FUSION (TLIF) WITH PEDICLE SCREW FIXATION 1 LEVEL Right 12/04/2019   Procedure: RIGHT-SIDED TRANSFORAMINAL LUMBAR INTERBODY FUSION LUMBAR FOUR THROUGH FIVE WITH INSTRUMENTATION AND ALLOGRAFT;  Surgeon: Phylliss Bob, MD;  Location: [facility];  Service: Orthopedics;  Laterality: Right;    Allergies as of 08/03/2020      Reactions   Aspirin Other (See Comments)   REACTION: swelling in face   Hydrocodone    Insomnia, mild SOB (w/o cough-wheezing)   Penicillins Other (See Comments)   REACTION: swelling in face Did it involve swelling of the face/tongue/throat, SOB, or low BP? Yes Did it involve sudden or severe rash/hives, skin peeling, or any reaction on the inside of your mouth or nose? No Did you need to seek medical attention at a hospital or doctor's office? Yes When did it last happen?[id number] played Hockey If all above answers are "NO", may  proceed with cephalosporin use.      Medication List       Accurate as of August 03, 2020 11:59 PM. If you have any questions, ask your nurse or doctor.        STOP taking these medications   cyclobenzaprine 5 MG tablet Commonly known as: FLEXERIL Stopped by: Kathlene November, MD   methocarbamol 500 MG tablet Commonly known as: ROBAXIN Stopped by: Kathlene November, MD     TAKE these medications   atorvastatin 40 MG tablet Commonly known as: LIPITOR Take 1 tablet (40 mg total) by mouth at bedtime.   clorazepate 7.5 MG tablet Commonly known as: TRANXENE TAKE ONE TABLET BY MOUTH AT BEDTIME AS NEEDED FOR ANXIETY   enalapril 5 MG tablet Commonly known as: VASOTEC Take 1 tablet (5 mg total) by mouth daily.   FIBER LAXATIVE PO Take 1 tablet by mouth daily.   hydrochlorothiazide 25 MG tablet Commonly known as: HYDRODIURIL Take 1 tablet (25 mg total) by mouth daily.   Melatonin 10 MG Tabs Take 10 mg by mouth at bedtime.   multivitamin per tablet Take 1 tablet by mouth daily.   traZODone 100 MG tablet Commonly known as: DESYREL Take 2 tablets (200 mg total) by mouth at bedtime.   vitamin E 45 MG (100 UNITS) capsule Take 100  Units by mouth daily.          Objective:   Physical Exam BP 117/80 (BP Location: Left Arm, Patient Position: Sitting, Cuff Size: Normal)   Pulse 87   Temp 97.6 F (36.4 C) (Oral)   Resp 18   Ht 5\' 10"  (1.778 m)   Wt 222 lb 2 oz (100.8 kg)   SpO2 93%   BMI 31.87 kg/m  General:   Well developed, NAD, BMI noted. HEENT:  Normocephalic . Face symmetric, atraumatic Lungs:  CTA B Normal respiratory effort, no intercostal retractions, no accessory muscle use. Heart: RRR,  no murmur.  Lower extremities: no pretibial edema bilaterally  Skin: Not pale. Not jaundice Neurologic:  alert & oriented X3.  Speech normal, gait appropriate for age and unassisted Psych--  Cognition and judgment appear intact.  Cooperative with normal attention span and  concentration.  Behavior appropriate. No anxious or depressed appearing.      Assessment      Assessment  HTN Hyperlipidemia Anxiety depression insomnia  dc clonazepam d/t not being effective,  8-16, rx trazodone + tranxene  CAD, non-obstructive Prostate cancer-- 2008, robotic surgery, no incontinence, Dr Alinda Money: now f/u per PCP as of 05-2018 [facility] ED past surgery  spermatocele, left Skin --Melanoma 2010, BCCs sees derm LE edema R>L (-) US DVT 2020 ABI 09/2019 WNL   PLAN: HTN: On Vasotec, HCTZ, last BMP satisfactory, well controlled, no change Anxiety, depression, insomnia: Continue Tranxene and trazodone DJ[facility]: Not taking any muscle relaxant, medication list edited. Preventive care:  Flu shot today To have a Covid vaccine booster soon Unclear if he had his 2nd Shingrix dose.  Recommend to proceed if needed RTC CPX 3 to 4 months  This visit occurred during the SARS-CoV-2 public health emergency.  Safety protocols were in place, including screening questions prior to the visit, additional usage of staff PPE, and extensive cleaning of exam room while observing appropriate contact time as indicated for disinfecting solutions.

## 2020-08-03 NOTE — Progress Notes (Signed)
Pre visit review using our clinic review tool, if applicable. No additional management support is needed unless otherwise documented below in the visit note. 

## 2020-08-03 NOTE — Patient Instructions (Addendum)
Please make sure you proceed with your 2nd Shingrix vaccine at your pharmacy.      GO TO THE FRONT DESK, PLEASE SCHEDULE YOUR APPOINTMENTS Come back for a physical exam in 3 to 4 months

## 2020-08-04 NOTE — Assessment & Plan Note (Signed)
HTN: On Vasotec, HCTZ, last BMP satisfactory, well controlled, no change Anxiety, depression, insomnia: Continue Tranxene and trazodone DJD: Not taking any muscle relaxant, medication list edited. Preventive care:  Flu shot today To have a Covid vaccine booster soon Unclear if he had his 2nd Shingrix dose.  Recommend to proceed if needed RTC CPX 3 to 4 months

## 2020-08-18 ENCOUNTER — Encounter: Payer: Self-pay | Admitting: Internal Medicine

## 2020-08-18 DIAGNOSIS — L600 Ingrowing nail: Secondary | ICD-10-CM

## 2020-08-21 DIAGNOSIS — Z23 Encounter for immunization: Secondary | ICD-10-CM | POA: Diagnosis not present

## 2020-08-26 DIAGNOSIS — H401432 Capsular glaucoma with pseudoexfoliation of lens, bilateral, moderate stage: Secondary | ICD-10-CM | POA: Diagnosis not present

## 2020-08-26 DIAGNOSIS — H43813 Vitreous degeneration, bilateral: Secondary | ICD-10-CM | POA: Diagnosis not present

## 2020-08-26 DIAGNOSIS — H52223 Regular astigmatism, bilateral: Secondary | ICD-10-CM | POA: Diagnosis not present

## 2020-08-26 DIAGNOSIS — Q141 Congenital malformation of retina: Secondary | ICD-10-CM | POA: Diagnosis not present

## 2020-08-26 DIAGNOSIS — H25013 Cortical age-related cataract, bilateral: Secondary | ICD-10-CM | POA: Diagnosis not present

## 2020-08-26 DIAGNOSIS — H2513 Age-related nuclear cataract, bilateral: Secondary | ICD-10-CM | POA: Diagnosis not present

## 2020-08-26 DIAGNOSIS — H5203 Hypermetropia, bilateral: Secondary | ICD-10-CM | POA: Diagnosis not present

## 2020-08-26 DIAGNOSIS — H02889 Meibomian gland dysfunction of unspecified eye, unspecified eyelid: Secondary | ICD-10-CM | POA: Diagnosis not present

## 2020-08-26 DIAGNOSIS — H25043 Posterior subcapsular polar age-related cataract, bilateral: Secondary | ICD-10-CM | POA: Diagnosis not present

## 2020-08-31 ENCOUNTER — Ambulatory Visit (INDEPENDENT_AMBULATORY_CARE_PROVIDER_SITE_OTHER): Payer: Medicare Other | Admitting: Podiatry

## 2020-08-31 ENCOUNTER — Other Ambulatory Visit: Payer: Self-pay

## 2020-08-31 VITALS — BP 133/74 | HR 83 | Temp 98.0°F

## 2020-08-31 DIAGNOSIS — B351 Tinea unguium: Secondary | ICD-10-CM

## 2020-08-31 DIAGNOSIS — L600 Ingrowing nail: Secondary | ICD-10-CM | POA: Diagnosis not present

## 2020-08-31 DIAGNOSIS — L608 Other nail disorders: Secondary | ICD-10-CM | POA: Diagnosis not present

## 2020-08-31 DIAGNOSIS — M79674 Pain in right toe(s): Secondary | ICD-10-CM

## 2020-08-31 DIAGNOSIS — G8929 Other chronic pain: Secondary | ICD-10-CM | POA: Diagnosis not present

## 2020-08-31 DIAGNOSIS — L603 Nail dystrophy: Secondary | ICD-10-CM | POA: Diagnosis not present

## 2020-08-31 MED ORDER — SULFAMETHOXAZOLE-TRIMETHOPRIM 800-160 MG PO TABS: 1.0000 | ORAL_TABLET | Freq: Two times a day (BID) | ORAL | 0 refills | Status: DC

## 2020-08-31 NOTE — Patient Instructions (Signed)

## 2020-09-02 NOTE — Progress Notes (Signed)
Subjective:   Patient ID: Larry Elliott, male   DOB: 74 y.o.   MRN: [id number]   HPI 74 year old male presents the office today for concerns of ingrown toenails the right big toe, lateral aspect of his been ongoing for 1 month. He says that has become more tender he is noticed redness and swelling to the nail border but denies any drainage or pus or any red streaks. He said no recent treatment.   Review of Systems  All other systems reviewed and are negative.  Past Medical History:  Diagnosis Date  . Anxiety and depression 11/04/2013  . Arthritis   . BCC (basal cell carcinoma), leg    Right calf, L nape of neck at hairline  . CAD (coronary artery disease)    CP, cath, non-obstructive  . Diverticulosis    colon  . Erectile dysfunction following radical prostatectomy   . Hyperlipidemia   . Hypertension   . Insomnia   . Melanoma ([place]) 2010   face, sees derm   . Prostate CA ([place]) 10/08   s/p robotic surgery  . Spermatocele    Left    Past Surgical History:  Procedure Laterality Date  . APPENDECTOMY    . HERNIA REPAIR    . KNEE ARTHROSCOPY Left    meniscal tear  . PROSTATECTOMY  07-2007    robotic,  for prostate cancer    . SHOULDER ARTHROSCOPY  07-09-2013   Dr Tamera Punt   . TRANSFORAMINAL LUMBAR INTERBODY FUSION (TLIF) WITH PEDICLE SCREW FIXATION 1 LEVEL Right 12/04/2019   Procedure: RIGHT-SIDED TRANSFORAMINAL LUMBAR INTERBODY FUSION LUMBAR FOUR THROUGH FIVE WITH INSTRUMENTATION AND ALLOGRAFT;  Surgeon: Phylliss Bob, MD;  Location: [facility];  Service: Orthopedics;  Laterality: Right;     Current Outpatient Medications:  .  atorvastatin (LIPITOR) 40 MG tablet, Take 1 tablet (40 mg total) by mouth at bedtime., Disp: 90 tablet, Rfl: 1 .  Calcium Polycarbophil (FIBER LAXATIVE PO), Take 1 tablet by mouth daily. , Disp: , Rfl:  .  clorazepate (TRANXENE) 7.5 MG tablet, TAKE ONE TABLET BY MOUTH AT BEDTIME AS NEEDED FOR ANXIETY, Disp: 30 tablet, Rfl: 3 .  enalapril (VASOTEC) 5 MG  tablet, Take 1 tablet (5 mg total) by mouth daily., Disp: 90 tablet, Rfl: 1 .  hydrochlorothiazide (HYDRODIURIL) 25 MG tablet, Take 1 tablet (25 mg total) by mouth daily., Disp: 90 tablet, Rfl: 0 .  Melatonin 10 MG TABS, Take 10 mg by mouth at bedtime., Disp: , Rfl:  .  multivitamin (THERAGRAN) per tablet, Take 1 tablet by mouth daily.  , Disp: , Rfl:  .  sulfamethoxazole-trimethoprim (BACTRIM DS) 800-160 MG tablet, Take 1 tablet by mouth 2 (two) times daily., Disp: 14 tablet, Rfl: 0 .  traZODone (DESYREL) 100 MG tablet, Take 2 tablets (200 mg total) by mouth at bedtime., Disp: 60 tablet, Rfl: 5 .  vitamin E 100 UNIT capsule, Take 100 Units by mouth daily.  , Disp: , Rfl:   Allergies  Allergen Reactions  . Aspirin Other (See Comments)    REACTION: swelling in face  . Hydrocodone     Insomnia, mild SOB (w/o cough-wheezing)  . Penicillins Other (See Comments)    REACTION: swelling in face  Did it involve swelling of the face/tongue/throat, SOB, or low BP? Yes Did it involve sudden or severe rash/hives, skin peeling, or any reaction on the inside of your mouth or nose? No Did you need to seek medical attention at a hospital or doctor's office? Yes When  did it last happen?[id number] played Hockey If all above answers are "NO", may proceed with cephalosporin use.        Objective:  Physical Exam  General: AAO x3, NAD  Dermatological: Incurvation present to the right lateral hallux nail border. There is localized edema and erythema with very minimal purulence at the distal aspect. There is no ascending cellulitis. No malodor. The nail is mildly hypertrophic, dystrophic, yellow discoloration. No open lesions.   Vascular: Dorsalis Pedis artery and Posterior Tibial artery pedal pulses are 2/4 bilateral with immedate capillary fill time.  There is no pain with calf compression, swelling, warmth, erythema.   Neruologic: Grossly intact via light touch bilateral.   Musculoskeletal:  Tenderness the right lateral nail border without any other areas of discomfort. Muscular strength 5/5 in all groups tested bilateral.  Gait: Unassisted, Nonantalgic.       Assessment:   Right lateral hallux symptomatic ingrown toenail  Plan:  -Treatment options discussed including all alternatives, risks, and complications -Etiology of symptoms were discussed -At this time, the patient is requesting partial nail removal with chemical matricectomy to the symptomatic portion of the nail. Risks and complications were discussed with the patient for which they understand and written consent was obtained. Under sterile conditions a total of 3 mL of a mixture of 2% lidocaine plain and 0.5% Marcaine plain was infiltrated in a hallux block fashion. Once anesthetized, the skin was prepped in sterile fashion. A tourniquet was then applied. Next the lateral aspect of hallux nail border was then sharply excised making sure to remove the entire offending nail border. Once the nails were ensured to be removed area was debrided and the underlying skin was intact. After removal of the nail there was no further purulence of the nail bed appear to be healthy. Next phenol was then applied under standard conditions and copiously irrigated. Silvadene was applied. A dry sterile dressing was applied. After application of the dressing the tourniquet was removed and there is found to be an immediate capillary refill time to the digit. The patient tolerated the procedure well any complications. Post procedure instructions were discussed the patient for which he verbally understood. Follow-up in one week for nail check or sooner if any problems are to arise. Discussed signs/symptoms of infection and directed to call the office immediately should any occur or go directly to the emergency room. In the meantime, encouraged to call the office with any questions, concerns, changes symptoms. -Bactrim -Nail sent for culture   Trula Slade DPM

## 2020-09-07 ENCOUNTER — Ambulatory Visit (INDEPENDENT_AMBULATORY_CARE_PROVIDER_SITE_OTHER): Payer: Medicare Other | Admitting: Podiatry

## 2020-09-07 ENCOUNTER — Other Ambulatory Visit: Payer: Self-pay

## 2020-09-07 DIAGNOSIS — B351 Tinea unguium: Secondary | ICD-10-CM

## 2020-09-07 DIAGNOSIS — L600 Ingrowing nail: Secondary | ICD-10-CM

## 2020-09-07 NOTE — Patient Instructions (Signed)

## 2020-09-11 ENCOUNTER — Other Ambulatory Visit: Payer: Self-pay | Admitting: Internal Medicine

## 2020-09-15 NOTE — Progress Notes (Signed)
Subjective: Larry Elliott is a 74 y.o.  male returns to office today for follow up evaluation after having right Hallux latearl partial nail avulsion performed. Patient has been soaking using epsom salts and applying topical antibiotic covered with bandaid daily.  States to be doing well with any issues.  No pain currently denies any drainage or pus any redness or red streaks.  Patient denies fevers, chills, nausea, vomiting. Denies any calf pain, chest pain, SOB.   Objective:  General: Well developed, nourished, in no acute distress, alert and oriented x3   Dermatology: Skin is warm, dry and supple bilateral. Right hallux nail border appears to be clean, dry, with mild granular tissue and surrounding scab. There is no surrounding erythema, edema, drainage/purulence. The remaining nails appear unremarkable at this time. There are no other lesions or other signs of infection present.  Neurovascular status: Intact. No lower extremity swelling; No pain with calf compression bilateral.  Musculoskeletal: Decreased tenderness to palpation of the right hallux nail fold. Muscular strength within normal limits bilateral.   Assesement and Plan: S/p partial nail avulsion, doing well.   -Continue soaking in epsom salts twice a day followed by antibiotic ointment and a band-aid. Can leave uncovered at night. Continue this until completely healed.  -If the area has not healed in 2 weeks, call the office for follow-up appointment, or sooner if any problems arise.  -Monitor for any signs/symptoms of infection. Call the office immediately if any occur or go directly to the emergency room. Call with any questions/concerns. -Awaiting nail culture result.  We will contact him once I receive the results.  Celesta Gentile, DPM

## 2020-09-18 ENCOUNTER — Telehealth: Payer: Self-pay | Admitting: Internal Medicine

## 2020-09-19 NOTE — Telephone Encounter (Signed)
Requesting:  tranxene Contract:n/a UDS:03/2020 Last Visit:08/03/20 Next Visit:10/2020 Last Refill:03/2020  Please Advise

## 2020-09-22 NOTE — Telephone Encounter (Signed)
PDMP okay, prescription sent 

## 2020-09-30 ENCOUNTER — Encounter: Payer: Self-pay | Admitting: Internal Medicine

## 2020-10-01 ENCOUNTER — Telehealth: Payer: Self-pay

## 2020-10-01 NOTE — Telephone Encounter (Signed)
Called patient to set him up for Video call per Dr. Larose Kells in regards to a mychart message the patient sent him. Patient's wife answered the call and stated that the patient was asleep and would try to call back if he gets up before 5 pm today, if not we will call him in the morning.

## 2020-10-02 ENCOUNTER — Encounter: Payer: Self-pay | Admitting: Internal Medicine

## 2020-10-02 ENCOUNTER — Telehealth (INDEPENDENT_AMBULATORY_CARE_PROVIDER_SITE_OTHER): Payer: Medicare Other | Admitting: Internal Medicine

## 2020-10-02 VITALS — BP 157/88 | Ht 70.0 in | Wt 220.0 lb

## 2020-10-02 DIAGNOSIS — G4700 Insomnia, unspecified: Secondary | ICD-10-CM | POA: Diagnosis not present

## 2020-10-02 MED ORDER — ZOLPIDEM TARTRATE 10 MG PO TABS: 5.0000 mg | ORAL_TABLET | Freq: Every evening | ORAL | 0 refills | Status: DC | PRN

## 2020-10-02 NOTE — Telephone Encounter (Signed)
LMOM informing Pt to return call/send mychart message to schedule virtual visit today. Only time available is 2:40pm w/ PCP.

## 2020-10-02 NOTE — Progress Notes (Signed)
Subjective:    Patient ID: Larry Elliott, male    DOB: [birth year], 74 y.o.   MRN: [id number]  DOS:  10/02/2020 Type of visit - description: Virtual Visit via Video Note  I connected with the above patient  by a video enabled telemedicine application and verified that I am speaking with the correct person using two identifiers.   THIS ENCOUNTER IS A VIRTUAL VISIT DUE TO COVID-19 - PATIENT WAS NOT SEEN IN THE OFFICE. PATIENT HAS CONSENTED TO VIRTUAL VISIT / TELEMEDICINE VISIT   Location of patient: home  Location of provider: office  Persons participating in the virtual visit: patient, provider   I discussed the limitations of evaluation and management by telemedicine and the availability of in person appointments. The patient expressed understanding and agreed to proceed.  Acute Has a long history of insomnia, until recently he used to tell me that it was well controlled with clorazepate and trazodone.  Lately however he is having a very hard time falling asleep. Once he falls asleep, he is okay.  Denies any major stress or anxiety at this point.   Review of Systems See above   Past Medical History:  Diagnosis Date  . Anxiety and depression 11/04/2013  . Arthritis   . BCC (basal cell carcinoma), leg    Right calf, L nape of neck at hairline  . CAD (coronary artery disease)    CP, cath, non-obstructive  . Diverticulosis    colon  . Erectile dysfunction following radical prostatectomy   . Hyperlipidemia   . Hypertension   . Insomnia   . Melanoma ([place]) 2010   face, sees derm   . Prostate CA ([place]) 10/08   s/p robotic surgery  . Spermatocele    Left    Past Surgical History:  Procedure Laterality Date  . APPENDECTOMY    . HERNIA REPAIR    . KNEE ARTHROSCOPY Left    meniscal tear  . PROSTATECTOMY  07-2007    robotic,  for prostate cancer    . SHOULDER ARTHROSCOPY  07-09-2013   Dr Tamera Punt   . TRANSFORAMINAL LUMBAR INTERBODY FUSION (TLIF) WITH PEDICLE SCREW  FIXATION 1 LEVEL Right 12/04/2019   Procedure: RIGHT-SIDED TRANSFORAMINAL LUMBAR INTERBODY FUSION LUMBAR FOUR THROUGH FIVE WITH INSTRUMENTATION AND ALLOGRAFT;  Surgeon: Phylliss Bob, MD;  Location: [facility];  Service: Orthopedics;  Laterality: Right;    Allergies as of 10/02/2020      Reactions   Aspirin Other (See Comments)   REACTION: swelling in face   Hydrocodone    Insomnia, mild SOB (w/o cough-wheezing)   Penicillins Other (See Comments)   REACTION: swelling in face Did it involve swelling of the face/tongue/throat, SOB, or low BP? Yes Did it involve sudden or severe rash/hives, skin peeling, or any reaction on the inside of your mouth or nose? No Did you need to seek medical attention at a hospital or doctor's office? Yes When did it last happen?[id number] played Hockey If all above answers are "NO", may proceed with cephalosporin use.      Medication List       Accurate as of October 02, 2020  2:58 PM. If you have any questions, ask your nurse or doctor.        atorvastatin 40 MG tablet Commonly known as: LIPITOR Take 1 tablet (40 mg total) by mouth at bedtime.   clorazepate 7.5 MG tablet Commonly known as: TRANXENE TAKE ONE TABLET BY MOUTH AT BEDTIME AS NEEDED FOR ANXIETY   enalapril  5 MG tablet Commonly known as: VASOTEC Take 1 tablet (5 mg total) by mouth daily.   FIBER LAXATIVE PO Take 1 tablet by mouth daily.   hydrochlorothiazide 25 MG tablet Commonly known as: HYDRODIURIL Take 1 tablet (25 mg total) by mouth daily.   Melatonin 10 MG Tabs Take 10 mg by mouth at bedtime.   multivitamin per tablet Take 1 tablet by mouth daily.   sulfamethoxazole-trimethoprim 800-160 MG tablet Commonly known as: BACTRIM DS Take 1 tablet by mouth 2 (two) times daily.   traZODone 100 MG tablet Commonly known as: DESYREL Take 2 tablets (200 mg total) by mouth at bedtime.   vitamin E 45 MG (100 UNITS) capsule Take 100 Units by mouth daily.           Objective:   Physical Exam Ht 5\' 10"  (1.778 m)   Wt 220 lb (99.8 kg)   BMI 31.57 kg/m  This is a virtual video visit, he is alert oriented x3.  In no distress.    Assessment      Assessment  HTN Hyperlipidemia Anxiety depression insomnia  dc clonazepam d/t not being effective,  8-16, rx trazodone + tranxene  CAD, non-obstructive Prostate cancer-- 2008, robotic surgery, no incontinence, Dr Alinda Money: now f/u per PCP as of 05-2018 [facility] ED past surgery  spermatocele, left Skin --Melanoma 2010, BCCs sees derm LE edema R>L (-) US DVT 2020 ABI 09/2019 WNL   PLAN: Insomnia: This is a long-term problem, until now I thought it was well controlled on clorazepate and trazodone. The problem has been so bad that he at times took 12 OTC sleeping meds.  Strongly rec not to do that ever again. On chart review, he used to take doxepin in 2014, clonazepam in 2016. On clorazepate since 2019 and trazodone since 2018. Plan: Stop clorazepate, decrease trazodone to 100 mg qhs Start Ambien 10 mg nightly Sleep habit extensively discussed. Call me next week if this is not working. Also message with summary of instructions sent     I discussed the assessment and treatment plan with the patient. The patient was provided an opportunity to ask questions and all were answered. The patient agreed with the plan and demonstrated an understanding of the instructions.   The patient was advised to call back or seek an in-person evaluation if the symptoms worsen or if the condition fails to improve as anticipated.

## 2020-10-02 NOTE — Progress Notes (Signed)
Pre visit review using our clinic review tool, if applicable. No additional management support is needed unless otherwise documented below in the visit note. 

## 2020-10-03 NOTE — Assessment & Plan Note (Signed)
Insomnia: This is a long-term problem, until now I thought it was well controlled on clorazepate and trazodone. The problem has been so bad that he at times took 12 OTC sleeping meds.  Strongly rec not to do that ever again. On chart review, he used to take doxepin in 2014, clonazepam in 2016. On clorazepate since 2019 and trazodone since 2018. Plan: Stop clorazepate, decrease trazodone to 100 mg qhs Start Ambien 10 mg nightly Sleep habit extensively discussed. Call me next week if this is not working. Also message with summary of instructions sent

## 2020-10-08 ENCOUNTER — Encounter: Payer: Self-pay | Admitting: Internal Medicine

## 2020-10-19 ENCOUNTER — Encounter: Payer: Self-pay | Admitting: Internal Medicine

## 2020-10-28 ENCOUNTER — Telehealth: Payer: Self-pay | Admitting: Internal Medicine

## 2020-10-29 NOTE — Telephone Encounter (Signed)
Requesting: Ambien 10mg  Contract: None UDS: None Last Visit: 10/02/2020 Next Visit: 11/04/2020  Last Refill: 10/02/2020 #30 and 0RF  Please Advise

## 2020-10-29 NOTE — Telephone Encounter (Signed)
PDMP okay, prescription sent 

## 2020-11-01 ENCOUNTER — Encounter: Payer: Self-pay | Admitting: Internal Medicine

## 2020-11-02 ENCOUNTER — Other Ambulatory Visit: Payer: Self-pay | Admitting: Internal Medicine

## 2020-11-02 NOTE — Telephone Encounter (Signed)
Advise patient: I sent a prescription for trazodone, he is only supposed to take 1 tablet at bedtime. He is also on Ambien.

## 2020-11-02 NOTE — Telephone Encounter (Signed)
Mychart message sent.

## 2020-11-02 NOTE — Telephone Encounter (Signed)
Should Pt be doing trazodone and Ambien?

## 2020-11-03 DIAGNOSIS — Z1152 Encounter for screening for COVID-19: Secondary | ICD-10-CM | POA: Diagnosis not present

## 2020-11-04 ENCOUNTER — Other Ambulatory Visit: Payer: Self-pay

## 2020-11-04 ENCOUNTER — Telehealth (INDEPENDENT_AMBULATORY_CARE_PROVIDER_SITE_OTHER): Payer: Medicare Other | Admitting: Internal Medicine

## 2020-11-04 ENCOUNTER — Encounter: Payer: Self-pay | Admitting: Internal Medicine

## 2020-11-04 VITALS — BP 144/85 | HR 70 | Ht 70.0 in

## 2020-11-04 DIAGNOSIS — Z8546 Personal history of malignant neoplasm of prostate: Secondary | ICD-10-CM | POA: Diagnosis not present

## 2020-11-04 DIAGNOSIS — E785 Hyperlipidemia, unspecified: Secondary | ICD-10-CM

## 2020-11-04 DIAGNOSIS — Z1211 Encounter for screening for malignant neoplasm of colon: Secondary | ICD-10-CM

## 2020-11-04 DIAGNOSIS — I10 Essential (primary) hypertension: Secondary | ICD-10-CM

## 2020-11-04 DIAGNOSIS — B349 Viral infection, unspecified: Secondary | ICD-10-CM | POA: Diagnosis not present

## 2020-11-04 NOTE — Progress Notes (Unsigned)
Pre visit review using our clinic review tool, if applicable. No additional management support is needed unless otherwise documented below in the visit note. 

## 2020-11-04 NOTE — Progress Notes (Unsigned)
Subjective:    Patient ID: Larry Elliott, male    DOB: 1946/09/20, 75 y.o.   MRN: [id number]  DOS:  11/04/2020 Type of visit - description: Virtual Visit via Video Note  I connected with the above patient  by a video enabled telemedicine application and verified that I am speaking with the correct person using two identifiers.   THIS ENCOUNTER IS A VIRTUAL VISIT DUE TO COVID-19 - PATIENT WAS NOT SEEN IN THE OFFICE. PATIENT HAS CONSENTED TO VIRTUAL VISIT / TELEMEDICINE VISIT   Location of patient: home  Location of provider: office  Persons participating in the virtual visit: patient, provider   I discussed the limitations of evaluation and management by telemedicine and the availability of in person appointments. The patient expressed understanding and agreed to proceed.  Follow-up This was supposed to be in person visit but he developed symptoms last week: Headache, nose congestion, decreased appetite, some sweats. No fever but some chills. No chest pain no difficulty breathing. No cough Mild right earache. All symptoms are much better today. He was tested for COVID yesterday, results pending.  We also discussed his blood pressure, insomnia, cholesterol and history of prostate cancer.      Review of Systems Denies any dysuria, urinary incontinence, blood in the urine. Insomnia is well controlled.   Past Medical History:  Diagnosis Date  . Anxiety and depression 11/04/2013  . Arthritis   . BCC (basal cell carcinoma), leg    Right calf, L nape of neck at hairline  . CAD (coronary artery disease)    CP, cath, non-obstructive  . Diverticulosis    colon  . Erectile dysfunction following radical prostatectomy   . Hyperlipidemia   . Hypertension   . Insomnia   . Melanoma ([place]) 2010   face, sees derm   . Prostate CA ([place]) 10/08   s/p robotic surgery  . Spermatocele    Left    Past Surgical History:  Procedure Laterality Date  . APPENDECTOMY    . HERNIA  REPAIR    . KNEE ARTHROSCOPY Left    meniscal tear  . PROSTATECTOMY  07-2007    robotic,  for prostate cancer    . SHOULDER ARTHROSCOPY  07-09-2013   Dr Tamera Punt   . TRANSFORAMINAL LUMBAR INTERBODY FUSION (TLIF) WITH PEDICLE SCREW FIXATION 1 LEVEL Right 12/04/2019   Procedure: RIGHT-SIDED TRANSFORAMINAL LUMBAR INTERBODY FUSION LUMBAR FOUR THROUGH FIVE WITH INSTRUMENTATION AND ALLOGRAFT;  Surgeon: Phylliss Bob, MD;  Location: [facility];  Service: Orthopedics;  Laterality: Right;    Allergies as of 11/04/2020      Reactions   Aspirin Other (See Comments)   REACTION: swelling in face   Hydrocodone    Insomnia, mild SOB (w/o cough-wheezing)   Penicillins Other (See Comments)   REACTION: swelling in face Did it involve swelling of the face/tongue/throat, SOB, or low BP? Yes Did it involve sudden or severe rash/hives, skin peeling, or any reaction on the inside of your mouth or nose? No Did you need to seek medical attention at a hospital or doctor's office? Yes When did it last happen?[id number] played Hockey If all above answers are "NO", may proceed with cephalosporin use.      Medication List       Accurate as of November 04, 2020 10:01 AM. If you have any questions, ask your nurse or doctor.        atorvastatin 40 MG tablet Commonly known as: LIPITOR Take 1 tablet (40 mg total) by  mouth at bedtime.   enalapril 5 MG tablet Commonly known as: VASOTEC Take 1 tablet (5 mg total) by mouth daily.   FIBER LAXATIVE PO Take 1 tablet by mouth daily.   hydrochlorothiazide 25 MG tablet Commonly known as: HYDRODIURIL Take 1 tablet (25 mg total) by mouth daily.   Melatonin 10 MG Tabs Take 10 mg by mouth at bedtime.   multivitamin per tablet Take 1 tablet by mouth daily.   traZODone 100 MG tablet Commonly known as: DESYREL Take 1 tablet (100 mg total) by mouth at bedtime.   vitamin E 45 MG (100 UNITS) capsule Take 100 Units by mouth daily.   zolpidem 10 MG tablet Commonly  known as: AMBIEN Take one-half to one tablet by mouth at bedtime as needed for sleep          Objective:   Physical Exam Ht 5\' 10"  (1.778 m)   BMI 31.57 kg/m  This is a virtual video visit.  He looks well, alert oriented x3, in no distress.    Assessment      Assessment  HTN Hyperlipidemia Anxiety depression insomnia  dc clonazepam d/t not being effective,  8-16, rx trazodone + tranxene  CAD, non-obstructive Prostate cancer-- 2008, robotic surgery, no incontinence, Dr Alinda Money: now f/u per PCP as of 05-2018 [facility] ED past surgery  spermatocele, left Skin --Melanoma 2010, BCCs sees derm LE edema R>L (-) US DVT 2020 ABI 09/2019 WNL   PLAN: Viral syndrome: As described above, feeling actually much better, test for COVID done yesterday, results pending. HTN: Ambulatory BPs mostly and then 140 over 80s. Continue enalapril, HCTZ.  Check a CMP and CBC High cholesterol, on atorvastatin, check FLP Preventive care reviewed RTC for labs next week, fasting (as long viral syndrome is resolved and COVID-negative).    RTC for routine visit in 4 months.     This visit occurred during the SARS-CoV-2 public health emergency.  Safety protocols were in place, including screening questions prior to the visit, additional usage of staff PPE, and extensive cleaning of exam room while observing appropriate contact time as indicated for disinfecting solutions.      I discussed the assessment and treatment plan with the patient. The patient was provided an opportunity to ask questions and all were answered. The patient agreed with the plan and demonstrated an understanding of the instructions.   The patient was advised to call back or seek an in-person evaluation if the symptoms worsen or if the condition fails to improve as anticipated.

## 2020-11-05 NOTE — Assessment & Plan Note (Signed)
Immunizations: Up-to-date, consider PNM 23 booster at some point Due for a colonoscopy, referred to [facility]. History of prostate cancer, released from urology, no symptoms, check a PSA.

## 2020-11-05 NOTE — Assessment & Plan Note (Signed)
Viral syndrome: As described above, feeling actually much better, test for COVID done yesterday, results pending. HTN: Ambulatory BPs mostly and then 140 over 80s. Continue enalapril, HCTZ.  Check a CMP and CBC High cholesterol, on atorvastatin, check FLP Preventive care reviewed RTC for labs next week, fasting (as long viral syndrome is resolved and COVID-negative).    RTC for routine visit in 4 months.

## 2020-11-07 ENCOUNTER — Other Ambulatory Visit: Payer: Self-pay

## 2020-11-07 ENCOUNTER — Encounter (HOSPITAL_COMMUNITY): Payer: Self-pay | Admitting: Emergency Medicine

## 2020-11-07 ENCOUNTER — Ambulatory Visit (HOSPITAL_COMMUNITY)
Admission: EM | Admit: 2020-11-07 | Discharge: 2020-11-07 | Disposition: A | Discharge status: Home / Self Care | Payer: Medicare Other | Attending: Family Medicine | Admitting: Family Medicine

## 2020-11-07 DIAGNOSIS — T161XXA Foreign body in right ear, initial encounter: Secondary | ICD-10-CM

## 2020-11-07 DIAGNOSIS — H9201 Otalgia, right ear: Secondary | ICD-10-CM

## 2020-11-07 MED ORDER — NEOMYCIN-POLYMYXIN-HC 3.5-10000-1 OT SUSP: 4.0000 [drp] | Freq: Three times a day (TID) | OTIC | 0 refills | Status: DC

## 2020-11-07 NOTE — ED Triage Notes (Signed)
Patient c/o RT sided ear pain x 1 week.   Patient denies fever or ear drainage.  Patient denies any other cold-like symptoms.   Patient denies taking any medications for symptoms.

## 2020-11-09 NOTE — ED Provider Notes (Signed)
[facility]   [id number] 11/07/20 Arrival Time: [place] PLAN:  1. Right ear pain   2. Acute foreign body of right ear canal, initial encounter    Plastic hearing aid piece removed from ear canal with alligator forceps. No complications.  Begin: Meds ordered this encounter  Medications  . neomycin-polymyxin-hydrocortisone (CORTISPORIN) 3.5-10000-1 OTIC suspension    Sig: Place 4 drops into the right ear 3 (three) times daily.    Dispense:  10 mL    Refill:  0    May f/u with PCP or here as needed.  Reviewed expectations re: course of current medical issues. Questions answered. Outlined signs and symptoms indicating need for more acute intervention. Patient verbalized understanding. After Visit Summary given.   SUBJECTIVE: History from: patient.  Larry Elliott is a 75 y.o. male who presents with complaint of R ear pressure; past week; afebrile; no ear drainage or bleeding. Otherwise well. Wears hearing aids.  Social History   Tobacco Use  Smoking Status Never Smoker  Smokeless Tobacco Never Used     OBJECTIVE:  Vitals:   11/07/20 1619  BP: (!) 167/89  Pulse: 84  Resp: 16  Temp: 98.3 F (36.8 C)  TempSrc: Oral  SpO2: 96%     General appearance: alert Ear Canal: gray rubber foreign body in canal on the right; after removal canal appears very inflamed; no drainage TM: normal Neck: supple without LAD Lungs: unlabored respirations Skin: warm and dry Psychological: alert and cooperative; normal mood and affect  Allergies  Allergen Reactions  . Aspirin Other (See Comments)    REACTION: swelling in face  . Hydrocodone     Insomnia, mild SOB (w/o cough-wheezing)  . Penicillins Other (See Comments)    REACTION: swelling in face  Did it involve swelling of the face/tongue/throat, SOB, or low BP? Yes Did it involve sudden or severe rash/hives, skin peeling, or any reaction on the inside of your mouth or nose? No Did you need to  seek medical attention at a hospital or doctor's office? Yes When did it last happen?[id number] played Hockey If all above answers are "NO", may proceed with cephalosporin use.    Past Medical History:  Diagnosis Date  . Anxiety and depression 11/04/2013  . Arthritis   . BCC (basal cell carcinoma), leg    Right calf, L nape of neck at hairline  . CAD (coronary artery disease)    CP, cath, non-obstructive  . Diverticulosis    colon  . Erectile dysfunction following radical prostatectomy   . Hyperlipidemia   . Hypertension   . Insomnia   . Melanoma ([place]) 2010   face, sees derm   . Prostate CA ([place]) 10/08   s/p robotic surgery  . Spermatocele    Left   Family History  Problem Relation Age of Onset  . Coronary artery disease Father 53       dx age 20s  . Stroke Mother   . Hypertension Neg Hx   . Diabetes Neg Hx   . Colon cancer Neg Hx   . Prostate cancer Neg Hx    Social History   Socioeconomic History  . Marital status: Married    Spouse name: Not on file  . Number of children: 2  . Years of education: Not on file  . Highest education level: Not on file  Occupational History  . Occupation: retired [employer] social research officer   Tobacco Use  . Smoking status: Never Smoker  . Smokeless tobacco:  Never Used  Vaping Use  . Vaping Use: Never used  Substance and Sexual Activity  . Alcohol use: Yes    Alcohol/week: 7.0 standard drinks    Types: 7 Glasses of wine per week    Comment: socially; 1 glass of wine/day  . Drug use: Never  . Sexual activity: Not on file  Other Topics Concern  . Not on file  Social History Narrative   Lives w/ wife    Original from [place]   Social Determinants of Health   Financial Resource Strain: Not on file  Food Insecurity: Not on file  Transportation Needs: Not on file  Physical Activity: Not on file  Stress: Not on file  Social Connections: Not on file  Intimate Partner Violence: Not on file            Vanessa Kick,  MD 11/09/20 1251

## 2020-11-11 ENCOUNTER — Encounter: Payer: Self-pay | Admitting: Internal Medicine

## 2020-11-11 DIAGNOSIS — H9201 Otalgia, right ear: Secondary | ICD-10-CM

## 2020-11-19 DIAGNOSIS — H9201 Otalgia, right ear: Secondary | ICD-10-CM | POA: Diagnosis not present

## 2020-11-19 DIAGNOSIS — M26609 Unspecified temporomandibular joint disorder, unspecified side: Secondary | ICD-10-CM | POA: Diagnosis not present

## 2020-11-27 DIAGNOSIS — M7061 Trochanteric bursitis, right hip: Secondary | ICD-10-CM | POA: Diagnosis not present

## 2020-11-27 DIAGNOSIS — M160 Bilateral primary osteoarthritis of hip: Secondary | ICD-10-CM | POA: Diagnosis not present

## 2020-12-16 ENCOUNTER — Other Ambulatory Visit (INDEPENDENT_AMBULATORY_CARE_PROVIDER_SITE_OTHER): Payer: Medicare Other

## 2020-12-16 ENCOUNTER — Other Ambulatory Visit: Payer: Self-pay

## 2020-12-16 DIAGNOSIS — I10 Essential (primary) hypertension: Secondary | ICD-10-CM

## 2020-12-16 DIAGNOSIS — E785 Hyperlipidemia, unspecified: Secondary | ICD-10-CM | POA: Diagnosis not present

## 2020-12-16 DIAGNOSIS — Z8546 Personal history of malignant neoplasm of prostate: Secondary | ICD-10-CM | POA: Diagnosis not present

## 2020-12-16 LAB — CBC WITH DIFFERENTIAL/PLATELET
Basophils Absolute: 0.1 10*3/uL (ref 0.0–0.1)
Basophils Relative: 0.7 % (ref 0.0–3.0)
Eosinophils Absolute: 0.1 10*3/uL (ref 0.0–0.7)
Eosinophils Relative: 1.4 % (ref 0.0–5.0)
HCT: 47.1 % (ref 39.0–52.0)
Hemoglobin: 15.8 g/dL (ref 13.0–17.0)
Lymphocytes Relative: 15.9 % (ref 12.0–46.0)
Lymphs Abs: 1.3 10*3/uL (ref 0.7–4.0)
MCHC: 33.6 g/dL (ref 30.0–36.0)
MCV: 91.2 fl (ref 78.0–100.0)
Monocytes Absolute: 0.8 10*3/uL (ref 0.1–1.0)
Monocytes Relative: 9.5 % (ref 3.0–12.0)
Neutro Abs: 5.8 10*3/uL (ref 1.4–7.7)
Neutrophils Relative %: 72.5 % (ref 43.0–77.0)
Platelets: 327 10*3/uL (ref 150.0–400.0)
RBC: 5.17 Mil/uL (ref 4.22–5.81)
RDW: 13.9 % (ref 11.5–15.5)
WBC: 8 10*3/uL (ref 4.0–10.5)

## 2020-12-16 LAB — COMPREHENSIVE METABOLIC PANEL
ALT: 80 U/L — ABNORMAL HIGH (ref 0–53)
AST: 33 U/L (ref 0–37)
Albumin: 4.3 g/dL (ref 3.5–5.2)
Alkaline Phosphatase: 86 U/L (ref 39–117)
BUN: 18 mg/dL (ref 6–23)
CO2: 27 mEq/L (ref 19–32)
Calcium: 9.8 mg/dL (ref 8.4–10.5)
Chloride: 102 mEq/L (ref 96–112)
Creatinine, Ser: 0.99 mg/dL (ref 0.40–1.50)
GFR: 73.69 mL/min (ref >60.00)
Glucose, Bld: 116 mg/dL — ABNORMAL HIGH (ref 70–99)
Potassium: 3.9 mEq/L (ref 3.5–5.1)
Sodium: 139 mEq/L (ref 135–145)
Total Bilirubin: 0.7 mg/dL (ref 0.2–1.2)
Total Protein: 6.8 g/dL (ref 6.0–8.3)

## 2020-12-16 LAB — LDL CHOLESTEROL, DIRECT: Direct LDL: 133 mg/dL

## 2020-12-16 LAB — PSA: PSA: 0 ng/mL — ABNORMAL LOW (ref 0.10–4.00)

## 2020-12-16 LAB — LIPID PANEL
Cholesterol: 248 mg/dL — ABNORMAL HIGH (ref 0–200)
HDL: 47.6 mg/dL (ref >39.00)
Total CHOL/HDL Ratio: 5
Triglycerides: 460 mg/dL — ABNORMAL HIGH (ref 0.0–149.0)

## 2020-12-18 DIAGNOSIS — M7061 Trochanteric bursitis, right hip: Secondary | ICD-10-CM | POA: Diagnosis not present

## 2020-12-22 ENCOUNTER — Encounter: Payer: Self-pay | Admitting: Internal Medicine

## 2020-12-23 DIAGNOSIS — H25013 Cortical age-related cataract, bilateral: Secondary | ICD-10-CM | POA: Diagnosis not present

## 2020-12-23 DIAGNOSIS — Q141 Congenital malformation of retina: Secondary | ICD-10-CM | POA: Diagnosis not present

## 2020-12-23 DIAGNOSIS — H2513 Age-related nuclear cataract, bilateral: Secondary | ICD-10-CM | POA: Diagnosis not present

## 2020-12-23 DIAGNOSIS — H25043 Posterior subcapsular polar age-related cataract, bilateral: Secondary | ICD-10-CM | POA: Diagnosis not present

## 2020-12-23 DIAGNOSIS — H43813 Vitreous degeneration, bilateral: Secondary | ICD-10-CM | POA: Diagnosis not present

## 2020-12-23 DIAGNOSIS — H401411 Capsular glaucoma with pseudoexfoliation of lens, right eye, mild stage: Secondary | ICD-10-CM | POA: Diagnosis not present

## 2021-01-01 ENCOUNTER — Encounter: Payer: Self-pay | Admitting: Internal Medicine

## 2021-01-11 DIAGNOSIS — Z86010 Personal history of colonic polyps: Secondary | ICD-10-CM | POA: Diagnosis not present

## 2021-01-21 DIAGNOSIS — Z01812 Encounter for preprocedural laboratory examination: Secondary | ICD-10-CM | POA: Diagnosis not present

## 2021-01-26 DIAGNOSIS — Z86010 Personal history of colonic polyps: Secondary | ICD-10-CM | POA: Diagnosis not present

## 2021-01-26 DIAGNOSIS — D128 Benign neoplasm of rectum: Secondary | ICD-10-CM | POA: Diagnosis not present

## 2021-01-26 DIAGNOSIS — K640 First degree hemorrhoids: Secondary | ICD-10-CM | POA: Diagnosis not present

## 2021-01-26 DIAGNOSIS — K5730 Diverticulosis of large intestine without perforation or abscess without bleeding: Secondary | ICD-10-CM | POA: Diagnosis not present

## 2021-01-26 DIAGNOSIS — D125 Benign neoplasm of sigmoid colon: Secondary | ICD-10-CM | POA: Diagnosis not present

## 2021-01-26 DIAGNOSIS — D124 Benign neoplasm of descending colon: Secondary | ICD-10-CM | POA: Diagnosis not present

## 2021-01-28 ENCOUNTER — Telehealth: Payer: Self-pay | Admitting: Internal Medicine

## 2021-01-28 NOTE — Telephone Encounter (Signed)
Requesting:ambien Contract:04/16/20 UDS:04/16/20 Last Visit:11/04/20 Next Visit:03/18/21 Last Refill:10/28/20  Please Advise

## 2021-01-29 DIAGNOSIS — D128 Benign neoplasm of rectum: Secondary | ICD-10-CM | POA: Diagnosis not present

## 2021-01-29 DIAGNOSIS — D125 Benign neoplasm of sigmoid colon: Secondary | ICD-10-CM | POA: Diagnosis not present

## 2021-01-29 DIAGNOSIS — D124 Benign neoplasm of descending colon: Secondary | ICD-10-CM | POA: Diagnosis not present

## 2021-01-29 NOTE — Telephone Encounter (Signed)
PDMP ok, rx sent  

## 2021-01-31 ENCOUNTER — Other Ambulatory Visit: Payer: Self-pay | Admitting: Internal Medicine

## 2021-03-17 ENCOUNTER — Other Ambulatory Visit: Payer: Self-pay | Admitting: Internal Medicine

## 2021-03-18 ENCOUNTER — Other Ambulatory Visit: Payer: Self-pay

## 2021-03-18 ENCOUNTER — Ambulatory Visit: Payer: Medicare Other | Attending: Internal Medicine

## 2021-03-18 ENCOUNTER — Ambulatory Visit (INDEPENDENT_AMBULATORY_CARE_PROVIDER_SITE_OTHER): Payer: Medicare Other | Admitting: Internal Medicine

## 2021-03-18 VITALS — BP 127/84 | HR 83 | Temp 98.3°F | Ht 70.0 in | Wt 228.0 lb

## 2021-03-18 DIAGNOSIS — E785 Hyperlipidemia, unspecified: Secondary | ICD-10-CM | POA: Diagnosis not present

## 2021-03-18 DIAGNOSIS — G4700 Insomnia, unspecified: Secondary | ICD-10-CM

## 2021-03-18 DIAGNOSIS — Z23 Encounter for immunization: Secondary | ICD-10-CM

## 2021-03-18 DIAGNOSIS — I10 Essential (primary) hypertension: Secondary | ICD-10-CM | POA: Diagnosis not present

## 2021-03-18 NOTE — Progress Notes (Signed)
   Covid-19 Vaccination Clinic  Name:  Larry Elliott    MRN: [id number] DOB: Dec 02, 1945  03/18/2021  Mr. Veronica was observed post Covid-19 immunization for 15 minutes without incident. He was provided with Vaccine Information Sheet and instruction to access the V-Safe system.   Mr. Etchison was instructed to call 911 with any severe reactions post vaccine: Marland Kitchen Difficulty breathing  . Swelling of face and throat  . A fast heartbeat  . A bad rash all over body  . Dizziness and weakness   Immunizations Administered    Name Date Dose VIS Date Route   PFIZER Comrnaty([place] TOP) Covid-19 Vaccine 03/18/2021  2:45 PM 0.3 mL 10/01/2020 Intramuscular   Manufacturer: [employer], [employer]   Lot: [id number]   NDC: [phone]

## 2021-03-18 NOTE — Progress Notes (Signed)
Subjective:    Patient ID: Larry Elliott, male    DOB: 1945-11-24, 75 y.o.   MRN: [id number]  DOS:  03/18/2021 Type of visit - description: F/U  Here for a routine checkup. In general feels well. Good med compliance. LFTs recently elevated.  Does not drink alcohol, takes tylenol rarely  Denies chest pain or difficulty breathing.  No lower extremity edema No dysuria, gross hematuria.  No urinary incontinence  Review of Systems See above   Past Medical History:  Diagnosis Date  . Anxiety and depression 11/04/2013  . Arthritis   . BCC (basal cell carcinoma), leg    Right calf, L nape of neck at hairline  . CAD (coronary artery disease)    CP, cath, non-obstructive  . Diverticulosis    colon  . Erectile dysfunction following radical prostatectomy   . Hyperlipidemia   . Hypertension   . Insomnia   . Melanoma ([place]) 2010   face, sees derm   . Prostate CA ([place]) 10/08   s/p robotic surgery  . Spermatocele    Left    Past Surgical History:  Procedure Laterality Date  . APPENDECTOMY    . HERNIA REPAIR    . KNEE ARTHROSCOPY Left    meniscal tear  . PROSTATECTOMY  07-2007    robotic,  for prostate cancer    . SHOULDER ARTHROSCOPY  07-09-2013   Dr Tamera Punt   . TRANSFORAMINAL LUMBAR INTERBODY FUSION (TLIF) WITH PEDICLE SCREW FIXATION 1 LEVEL Right 12/04/2019   Procedure: RIGHT-SIDED TRANSFORAMINAL LUMBAR INTERBODY FUSION LUMBAR FOUR THROUGH FIVE WITH INSTRUMENTATION AND ALLOGRAFT;  Surgeon: Phylliss Bob, MD;  Location: [facility];  Service: Orthopedics;  Laterality: Right;    Allergies as of 03/18/2021      Reactions   Aspirin Other (See Comments)   REACTION: swelling in face   Hydrocodone    Insomnia, mild SOB (w/o cough-wheezing)   Penicillins Other (See Comments)   REACTION: swelling in face Did it involve swelling of the face/tongue/throat, SOB, or low BP? Yes Did it involve sudden or severe rash/hives, skin peeling, or any reaction on the inside of your mouth or  nose? No Did you need to seek medical attention at a hospital or doctor's office? Yes When did it last happen?[id number] played Hockey If all above answers are "NO", may proceed with cephalosporin use.      Medication List       Accurate as of Mar 18, 2021  9:14 PM. If you have any questions, ask your nurse or doctor.        atorvastatin 40 MG tablet Commonly known as: LIPITOR Take 1 tablet (40 mg total) by mouth at bedtime.   enalapril 5 MG tablet Commonly known as: VASOTEC Take 1 tablet (5 mg total) by mouth daily.   FIBER LAXATIVE PO Take 1 tablet by mouth daily.   hydrochlorothiazide 25 MG tablet Commonly known as: HYDRODIURIL Take 1 tablet (25 mg total) by mouth daily.   Melatonin 10 MG Tabs Take 10 mg by mouth at bedtime.   multivitamin per tablet Take 1 tablet by mouth daily.   neomycin-polymyxin-hydrocortisone 3.5-10000-1 OTIC suspension Commonly known as: CORTISPORIN Place 4 drops into the right ear 3 (three) times daily.   traZODone 100 MG tablet Commonly known as: DESYREL Take 1 tablet (100 mg total) by mouth at bedtime.   vitamin E 45 MG (100 UNITS) capsule Take 100 Units by mouth daily.   zolpidem 10 MG tablet Commonly known as: AMBIEN TAKE  HALF TO ONE TABLET BY MOUTH AT BEDTIME AS NEEDED FOR SLEEP          Objective:   Physical Exam BP 127/84 (BP Location: Right Arm, Patient Position: Sitting, Cuff Size: Large)   Pulse 83   Temp 98.3 F (36.8 C) (Temporal)   Ht 5\' 10"  (1.778 m)   Wt 228 lb (103.4 kg)   SpO2 94%   BMI 32.71 kg/m  General:   Well developed, NAD, BMI noted. HEENT:  Normocephalic . Face symmetric, atraumatic Lungs:  CTA B Normal respiratory effort, no intercostal retractions, no accessory muscle use. Heart: RRR,  no murmur.  Lower extremities: no pretibial edema bilaterally  Skin: Not pale. Not jaundice Neurologic:  alert & oriented X3.  Speech normal, gait appropriate for age and unassisted Psych--   Cognition and judgment appear intact.  Cooperative with normal attention span and concentration.  Behavior appropriate. No anxious or depressed appearing.      Assessment    Assessment  HTN Hyperlipidemia Anxiety depression insomnia  dc clonazepam d/t not being effective,  8-16, rx trazodone + tranxene  CAD, non-obstructive Prostate cancer-- 2008, robotic surgery, no incontinence, Dr Alinda Money: now f/u per PCP as of 05-2018 [facility] ED past surgery  spermatocele, left Skin --Melanoma 2010, BCCs sees derm LE edema R>L (-) US DVT 2020 ABI 09/2019 WNL   PLAN: HTN: Seems well controlled, continue enalapril, HCTZ.  Last BMP satisfactory Hyperlipidemia: Last LDL elevated, apparently he was not taking atorvastatin regularly, he is now.  Check FLP, AST, ALT Increased LFTs: See last labs, does not drink wine but rarely, very seldom takes Tylenol, recheck LFTs. Insomnia: Currently on trazodone and Ambien.  Contract signed.  Reports good response to medications Preventive care: Recommend COVID-vaccine #4 RTC 5 months   This visit occurred during the SARS-CoV-2 public health emergency.  Safety protocols were in place, including screening questions prior to the visit, additional usage of staff PPE, and extensive cleaning of exam room while observing appropriate contact time as indicated for disinfecting solutions.

## 2021-03-18 NOTE — Patient Instructions (Signed)
Recommend to proceed with your COVID-vaccine #4  Please see your dermatologist  GO TO THE LAB : Get the blood work     [place], [place] back for a checkup in 5 months

## 2021-03-18 NOTE — Assessment & Plan Note (Signed)
HTN: Seems well controlled, continue enalapril, HCTZ.  Last BMP satisfactory Hyperlipidemia: Last LDL elevated, apparently he was not taking atorvastatin regularly, he is now.  Check FLP, AST, ALT Increased LFTs: See last labs, does not drink wine but rarely, very seldom takes Tylenol, recheck LFTs. Insomnia: Currently on trazodone and Ambien.  Contract signed.  Reports good response to medications Preventive care: Recommend COVID-vaccine #4 RTC 5 months

## 2021-03-18 NOTE — Telephone Encounter (Signed)
Appt later today.

## 2021-03-19 LAB — LIPID PANEL
Cholesterol: 140 mg/dL (ref 0–200)
HDL: 38.2 mg/dL — ABNORMAL LOW
NonHDL: 102.04
Total CHOL/HDL Ratio: 4
Triglycerides: 354 mg/dL — ABNORMAL HIGH (ref 0.0–149.0)
VLDL: 70.8 mg/dL — ABNORMAL HIGH (ref 0.0–40.0)

## 2021-03-19 LAB — AST: AST: 24 U/L (ref 0–37)

## 2021-03-19 LAB — ALT: ALT: 43 U/L (ref 0–53)

## 2021-03-19 LAB — LDL CHOLESTEROL, DIRECT: Direct LDL: 62 mg/dL

## 2021-03-19 MED ORDER — ATORVASTATIN CALCIUM 40 MG PO TABS: 40.0000 mg | ORAL_TABLET | Freq: Every day | ORAL | 3 refills | Status: DC

## 2021-03-19 NOTE — Telephone Encounter (Signed)
Requesting: Ambien Contract: 03/18/21 UDS: Last Visit: Next Visit: 04/16/20 Last Refill: 01/29/2021  Please Advise

## 2021-03-19 NOTE — Addendum Note (Signed)
Addended by: Wynonia Musty A on: 03/19/2021 04:22 PM   Modules accepted: Orders

## 2021-03-23 ENCOUNTER — Other Ambulatory Visit: Payer: Self-pay | Admitting: Internal Medicine

## 2021-03-23 NOTE — Telephone Encounter (Signed)
PDMP reviewed, he got 30 tablets of 02/27/2021.  Too early to refill.

## 2021-03-26 ENCOUNTER — Other Ambulatory Visit (HOSPITAL_BASED_OUTPATIENT_CLINIC_OR_DEPARTMENT_OTHER): Payer: Self-pay

## 2021-03-26 DIAGNOSIS — Z23 Encounter for immunization: Secondary | ICD-10-CM | POA: Diagnosis not present

## 2021-03-26 MED ORDER — PFIZER-BIONT COVID-19 VAC-TRIS 30 MCG/0.3ML IM SUSP
INTRAMUSCULAR | 0 refills | Status: DC
  Filled 2021-03-26: qty 0.3, 1d supply, fill #0

## 2021-03-29 ENCOUNTER — Encounter: Payer: Self-pay | Admitting: Internal Medicine

## 2021-03-29 ENCOUNTER — Telehealth: Payer: Self-pay | Admitting: Internal Medicine

## 2021-03-29 NOTE — Telephone Encounter (Signed)
PDMP okay, Rx sent 

## 2021-03-29 NOTE — Telephone Encounter (Signed)
Requesting: Ambien Contract: 03/18/21 UDS: Last Visit: Next Visit: 04/16/20 Last Refill: 01/29/2021  Please Advise

## 2021-04-20 ENCOUNTER — Encounter: Payer: Self-pay | Admitting: Internal Medicine

## 2021-04-29 ENCOUNTER — Other Ambulatory Visit: Payer: Self-pay | Admitting: Internal Medicine

## 2021-05-12 DIAGNOSIS — Z20822 Contact with and (suspected) exposure to covid-19: Secondary | ICD-10-CM | POA: Diagnosis not present

## 2021-06-23 ENCOUNTER — Telehealth: Payer: Self-pay | Admitting: Internal Medicine

## 2021-06-24 NOTE — Telephone Encounter (Signed)
Requesting: Ambien '10mg'$  Contract: 10/02/2018, needs new contract at next ov UDS: 04/16/2020 Last Visit: 03/18/2021 Next Visit: 08/18/2021 Last Refill: 03/29/2021 #30 and 2RF  Please Advise

## 2021-06-24 NOTE — Telephone Encounter (Signed)
PDMP reviewed, few days early but this is a long weekend thus will refill [place]

## 2021-06-29 ENCOUNTER — Other Ambulatory Visit (HOSPITAL_BASED_OUTPATIENT_CLINIC_OR_DEPARTMENT_OTHER): Payer: Self-pay

## 2021-06-30 DIAGNOSIS — H2513 Age-related nuclear cataract, bilateral: Secondary | ICD-10-CM | POA: Diagnosis not present

## 2021-06-30 DIAGNOSIS — Q141 Congenital malformation of retina: Secondary | ICD-10-CM | POA: Diagnosis not present

## 2021-06-30 DIAGNOSIS — H25043 Posterior subcapsular polar age-related cataract, bilateral: Secondary | ICD-10-CM | POA: Diagnosis not present

## 2021-06-30 DIAGNOSIS — H43813 Vitreous degeneration, bilateral: Secondary | ICD-10-CM | POA: Diagnosis not present

## 2021-06-30 DIAGNOSIS — H401411 Capsular glaucoma with pseudoexfoliation of lens, right eye, mild stage: Secondary | ICD-10-CM | POA: Diagnosis not present

## 2021-06-30 DIAGNOSIS — H25013 Cortical age-related cataract, bilateral: Secondary | ICD-10-CM | POA: Diagnosis not present

## 2021-08-18 ENCOUNTER — Encounter: Payer: Self-pay | Admitting: Internal Medicine

## 2021-08-18 ENCOUNTER — Ambulatory Visit: Payer: Medicare Other | Attending: Internal Medicine

## 2021-08-18 ENCOUNTER — Ambulatory Visit (INDEPENDENT_AMBULATORY_CARE_PROVIDER_SITE_OTHER): Payer: Medicare Other | Admitting: Internal Medicine

## 2021-08-18 ENCOUNTER — Other Ambulatory Visit: Payer: Self-pay

## 2021-08-18 VITALS — BP 138/72 | HR 76 | Temp 98.2°F | Resp 18 | Ht 70.0 in | Wt 233.2 lb

## 2021-08-18 DIAGNOSIS — R739 Hyperglycemia, unspecified: Secondary | ICD-10-CM

## 2021-08-18 DIAGNOSIS — F419 Anxiety disorder, unspecified: Secondary | ICD-10-CM | POA: Diagnosis not present

## 2021-08-18 DIAGNOSIS — I10 Essential (primary) hypertension: Secondary | ICD-10-CM

## 2021-08-18 DIAGNOSIS — F32A Depression, unspecified: Secondary | ICD-10-CM

## 2021-08-18 DIAGNOSIS — G4700 Insomnia, unspecified: Secondary | ICD-10-CM

## 2021-08-18 DIAGNOSIS — Z23 Encounter for immunization: Secondary | ICD-10-CM | POA: Diagnosis not present

## 2021-08-18 LAB — BASIC METABOLIC PANEL
BUN: 21 mg/dL (ref 6–23)
CO2: 31 mEq/L (ref 19–32)
Calcium: 9.7 mg/dL (ref 8.4–10.5)
Chloride: 101 mEq/L (ref 96–112)
Creatinine, Ser: 0.84 mg/dL (ref 0.40–1.50)
GFR: 83.96 mL/min (ref >60.00)
Glucose, Bld: 105 mg/dL — ABNORMAL HIGH (ref 70–99)
Potassium: 4.2 mEq/L (ref 3.5–5.1)
Sodium: 141 mEq/L (ref 135–145)

## 2021-08-18 LAB — HEMOGLOBIN A1C: Hgb A1c MFr Bld: 6.1 % (ref 4.6–6.5)

## 2021-08-18 NOTE — Patient Instructions (Addendum)
Recommend to proceed with new covid booster (bivalent) at your pharmacy or alternatively you can also do this at our pharmacy today.    Check the  blood pressure once or twice a month BP GOAL is between 110/65 and  135/85. If it is consistently higher or lower, let me know    GO TO THE LAB : Get the blood work     [place], [place] back for   checkup in 4 months

## 2021-08-18 NOTE — Progress Notes (Signed)
Subjective:    Patient ID: Larry Elliott, male    DOB: 1946/07/28, 75 y.o.   MRN: [id number]  DOS:  08/18/2021 Type of visit - description: rov   Since the last office visit is doing well. Denies any major problems.  Specifically denies chest pain or difficulty breathing. No dysuria or gross hematuria.  No difficulty urinating.  No urgency. Vision is poor, to have cataract surgery soon.  Review of Systems See above   Past Medical History:  Diagnosis Date   Anxiety and depression 11/04/2013   Arthritis    BCC (basal cell carcinoma), leg    Right calf, L nape of neck at hairline   CAD (coronary artery disease)    CP, cath, non-obstructive   Diverticulosis    colon   Erectile dysfunction following radical prostatectomy    Hyperlipidemia    Hypertension    Insomnia    Melanoma ([place]) 2010   face, sees derm    Prostate CA ([place]) 10/08   s/p robotic surgery   Spermatocele    Left    Past Surgical History:  Procedure Laterality Date   APPENDECTOMY     HERNIA REPAIR     KNEE ARTHROSCOPY Left    meniscal tear   PROSTATECTOMY  07-2007    robotic,  for prostate cancer     SHOULDER ARTHROSCOPY  07-09-2013   Dr Tamera Punt    TRANSFORAMINAL LUMBAR INTERBODY FUSION (TLIF) WITH PEDICLE SCREW FIXATION 1 LEVEL Right 12/04/2019   Procedure: RIGHT-SIDED TRANSFORAMINAL LUMBAR INTERBODY FUSION LUMBAR FOUR THROUGH FIVE WITH INSTRUMENTATION AND ALLOGRAFT;  Surgeon: Phylliss Bob, MD;  Location: [facility];  Service: Orthopedics;  Laterality: Right;    Allergies as of 08/18/2021       Reactions   Aspirin Other (See Comments)   REACTION: swelling in face   Hydrocodone    Insomnia, mild SOB (w/o cough-wheezing)   Penicillins Other (See Comments)   REACTION: swelling in face Did it involve swelling of the face/tongue/throat, SOB, or low BP? Yes Did it involve sudden or severe rash/hives, skin peeling, or any reaction on the inside of your mouth or nose? No Did you need to seek medical  attention at a hospital or doctor's office? Yes When did it last happen?      [id number] played Hockey If all above answers are "NO", may proceed with cephalosporin use.        Medication List        Accurate as of August 18, 2021 11:59 PM. If you have any questions, ask your nurse or doctor.          STOP taking these medications    Pfizer-BioNT COVID-19 Vac-TriS Susp injection Generic drug: COVID-19 mRNA Vac-TriS Therapist, music) Stopped by: Kathlene November, MD       TAKE these medications    atorvastatin 40 MG tablet Commonly known as: LIPITOR Take 1 tablet (40 mg total) by mouth at bedtime.   enalapril 5 MG tablet Commonly known as: VASOTEC TAKE ONE TABLET BY MOUTH ONE TIME DAILY   FIBER LAXATIVE PO Take 1 tablet by mouth daily.   hydrochlorothiazide 25 MG tablet Commonly known as: HYDRODIURIL TAKE ONE TABLET BY MOUTH ONE TIME DAILY   Melatonin 10 MG Tabs Take 10 mg by mouth at bedtime.   multivitamin per tablet Take 1 tablet by mouth daily.   neomycin-polymyxin-hydrocortisone 3.5-10000-1 OTIC suspension Commonly known as: CORTISPORIN Place 4 drops into the right ear 3 (three) times daily.   traZODone 100  MG tablet Commonly known as: DESYREL Take 1 tablet (100 mg total) by mouth at bedtime.   vitamin E 45 MG (100 UNITS) capsule Take 100 Units by mouth daily.   zolpidem 10 MG tablet Commonly known as: AMBIEN Take one-half to one tablet by mouth at bedtime as needed for sleep           Objective:   Physical Exam BP 138/72 (BP Location: Left Arm, Patient Position: Sitting, Cuff Size: Small)   Pulse 76   Temp 98.2 F (36.8 C) (Oral)   Resp 18   Ht 5\' 10"  (1.778 m)   Wt 233 lb 4 oz (105.8 kg)   SpO2 93%   BMI 33.47 kg/m  General:   Well developed, NAD, BMI noted.  HEENT:  Normocephalic . Face symmetric, atraumatic Lungs:  CTA B Normal respiratory effort, no intercostal retractions, no accessory muscle use. Heart: RRR,  no murmur.  Abdomen:   Not distended, soft, non-tender. No rebound or rigidity.  No bruit. Skin: Not pale. Not jaundice Lower extremities: no pretibial edema bilaterally  Neurologic:  alert & oriented X3.  Speech normal, gait and transferring somewhat limited by DJD  psych--  Cognition and judgment appear intact.  Cooperative with normal attention span and concentration.  Behavior appropriate. No anxious or depressed appearing.     Assessment    Assessment  HTN Hyperlipidemia Anxiety depression insomnia  dc clonazepam d/t not being effective,  8-16, rx trazodone + tranxene  CAD, non-obstructive Prostate cancer-- 2008, robotic surgery, no incontinence, Dr Alinda Money: now f/u per PCP as of 05-2018 [facility] ED past surgery  spermatocele, left Skin --Melanoma 2010, BCCs sees derm LE edema R>L (-) US DVT 2020 ABI 09/2019 WNL  PLAN: HTN: BP today is great, continue HCTZ, enalapril, check BMP Mild hyperglycemia Per chart review: Check A1c Anxiety, depression, insomnia: Well-controlled on trazodone and Ambien.  Contract signed. Cataract surgery scheduled per patient. DJD: Noted gait and transferring slightly limited. Preventive care: Flu shot today, recommend COVID booster RTC 4 months   This visit occurred during the SARS-CoV-2 public health emergency.  Safety protocols were in place, including screening questions prior to the visit, additional usage of staff PPE, and extensive cleaning of exam room while observing appropriate contact time as indicated for disinfecting solutions.

## 2021-08-18 NOTE — Progress Notes (Signed)
   Covid-19 Vaccination Clinic  Name:  Larry Elliott    MRN: [id number] DOB: 1946-03-17  08/18/2021  [name] was observed post Covid-19 immunization for 15 minutes without incident. He was provided with Vaccine Information Sheet and instruction to access the V-Safe system.   [name] was instructed to call 911 with any severe reactions post vaccine: Difficulty breathing  Swelling of face and throat  A fast heartbeat  A bad rash all over body  Dizziness and weakness   Immunizations Administered     Name Date Dose VIS Date Route   Pfizer Covid-19 Vaccine Bivalent Booster 08/18/2021 11:38 AM 0.3 mL 06/23/2021 Intramuscular   Manufacturer: [place]   Lot: [id number]   [place]: [phone]

## 2021-08-19 DIAGNOSIS — H2513 Age-related nuclear cataract, bilateral: Secondary | ICD-10-CM | POA: Diagnosis not present

## 2021-08-19 DIAGNOSIS — H25043 Posterior subcapsular polar age-related cataract, bilateral: Secondary | ICD-10-CM | POA: Diagnosis not present

## 2021-08-19 DIAGNOSIS — H2512 Age-related nuclear cataract, left eye: Secondary | ICD-10-CM | POA: Diagnosis not present

## 2021-08-19 DIAGNOSIS — H25013 Cortical age-related cataract, bilateral: Secondary | ICD-10-CM | POA: Diagnosis not present

## 2021-08-19 NOTE — Assessment & Plan Note (Signed)
HTN: BP today is great, continue HCTZ, enalapril, check BMP Mild hyperglycemia Per chart review: Check A1c Anxiety, depression, insomnia: Well-controlled on trazodone and Ambien.  Contract signed. Cataract surgery scheduled per patient. DJD: Noted gait and transferring slightly limited. Preventive care: Flu shot today, recommend COVID booster RTC 4 months

## 2021-08-27 DIAGNOSIS — H2512 Age-related nuclear cataract, left eye: Secondary | ICD-10-CM | POA: Diagnosis not present

## 2021-08-27 DIAGNOSIS — H25012 Cortical age-related cataract, left eye: Secondary | ICD-10-CM | POA: Diagnosis not present

## 2021-08-27 DIAGNOSIS — H25041 Posterior subcapsular polar age-related cataract, right eye: Secondary | ICD-10-CM | POA: Diagnosis not present

## 2021-08-27 DIAGNOSIS — H25011 Cortical age-related cataract, right eye: Secondary | ICD-10-CM | POA: Diagnosis not present

## 2021-08-27 DIAGNOSIS — H2511 Age-related nuclear cataract, right eye: Secondary | ICD-10-CM | POA: Diagnosis not present

## 2021-09-10 ENCOUNTER — Other Ambulatory Visit (HOSPITAL_BASED_OUTPATIENT_CLINIC_OR_DEPARTMENT_OTHER): Payer: Self-pay

## 2021-09-10 DIAGNOSIS — Z23 Encounter for immunization: Secondary | ICD-10-CM | POA: Diagnosis not present

## 2021-09-10 MED ORDER — PFIZER COVID-19 VAC BIVALENT 30 MCG/0.3ML IM SUSP
INTRAMUSCULAR | 0 refills | Status: DC
  Filled 2021-09-10: qty 0.3, 1d supply, fill #0

## 2021-09-13 ENCOUNTER — Telehealth: Payer: Self-pay | Admitting: Internal Medicine

## 2021-09-13 DIAGNOSIS — H2511 Age-related nuclear cataract, right eye: Secondary | ICD-10-CM | POA: Diagnosis not present

## 2021-09-13 NOTE — Telephone Encounter (Signed)
Left message for patient to call back and schedule Medicare Annual Wellness Visit (AWV) in office.   If not able to come in office, please offer to do virtually or by telephone.  Left office number and my jabber [phone].  Last AWV:07/22/2016  Please schedule at anytime with Nurse Health Advisor.

## 2021-09-14 DIAGNOSIS — H2511 Age-related nuclear cataract, right eye: Secondary | ICD-10-CM | POA: Diagnosis not present

## 2021-09-20 ENCOUNTER — Telehealth: Payer: Self-pay | Admitting: Internal Medicine

## 2021-09-20 NOTE — Telephone Encounter (Signed)
PDMP okay, Rx sent 

## 2021-09-20 NOTE — Telephone Encounter (Signed)
Requesting: ambien 10mg  Contract: 08/18/2021 UDS: 04/16/2020 Last Visit: 08/18/2021 Next Visit: 12/22/2021 Last Refill: 06/24/2021 #30 and 2rf  Please Advise

## 2021-09-28 DIAGNOSIS — Z20822 Contact with and (suspected) exposure to covid-19: Secondary | ICD-10-CM | POA: Diagnosis not present

## 2021-10-11 DIAGNOSIS — M7062 Trochanteric bursitis, left hip: Secondary | ICD-10-CM | POA: Diagnosis not present

## 2021-10-11 DIAGNOSIS — M7061 Trochanteric bursitis, right hip: Secondary | ICD-10-CM | POA: Diagnosis not present

## 2021-10-13 ENCOUNTER — Other Ambulatory Visit: Payer: Self-pay | Admitting: Internal Medicine

## 2021-11-03 ENCOUNTER — Other Ambulatory Visit: Payer: Self-pay | Admitting: Internal Medicine

## 2021-11-26 ENCOUNTER — Telehealth: Payer: Self-pay | Admitting: Internal Medicine

## 2021-11-26 NOTE — Telephone Encounter (Signed)
Left message for patient to call back and schedule Medicare Annual Wellness Visit (AWV) in office.   If not able to come in office, please offer to do virtually or by telephone.  Left office number and my jabber [phone].  Last AWV:07/22/2016  Please schedule at anytime with Nurse Health Advisor.

## 2021-12-22 ENCOUNTER — Ambulatory Visit: Payer: Medicare Other | Admitting: Internal Medicine

## 2021-12-24 ENCOUNTER — Encounter: Payer: Self-pay | Admitting: Internal Medicine

## 2021-12-25 ENCOUNTER — Encounter: Payer: Self-pay | Admitting: Internal Medicine

## 2021-12-29 ENCOUNTER — Encounter: Payer: Self-pay | Admitting: Family Medicine

## 2021-12-29 ENCOUNTER — Other Ambulatory Visit (HOSPITAL_BASED_OUTPATIENT_CLINIC_OR_DEPARTMENT_OTHER): Payer: Self-pay

## 2021-12-29 ENCOUNTER — Ambulatory Visit (INDEPENDENT_AMBULATORY_CARE_PROVIDER_SITE_OTHER): Payer: Medicare Other | Admitting: Family Medicine

## 2021-12-29 VITALS — BP 141/71 | HR 81 | Temp 98.5°F | Ht 70.0 in | Wt 235.0 lb

## 2021-12-29 DIAGNOSIS — J0140 Acute pansinusitis, unspecified: Secondary | ICD-10-CM | POA: Diagnosis not present

## 2021-12-29 MED ORDER — DOXYCYCLINE HYCLATE 100 MG PO TABS
100.0000 mg | ORAL_TABLET | Freq: Two times a day (BID) | ORAL | 0 refills | Status: AC
  Filled 2021-12-29: qty 14, 7d supply, fill #0

## 2021-12-29 MED ORDER — FLUTICASONE PROPIONATE 50 MCG/ACT NA SUSP
2.0000 | Freq: Every day | NASAL | 0 refills | Status: DC
  Filled 2021-12-29: qty 16, 30d supply, fill #0

## 2021-12-29 NOTE — Progress Notes (Signed)
? ?Acute Office Visit ? ?Subjective:  ? ? Patient ID: Larry Elliott, male    DOB: [birth year], 76 y.o.   MRN: [id number] ? ?CC: ongoing URI  ? ? ?HPI ?Patient is in today for ongoing URI symptoms.  ? ?Patient reports for the past 2 weeks he has had ongoing nasal congestion with discolored mucus, sore throat, dry cough, sinus pressure, headaches, ear pressure.  He has been taking extra Tylenol which will help with the headache temporarily.  He denies any chest pain, shortness of breath, wheezing, fevers, chills, body aches, loss of taste or smell, GI/GU symptoms. ? ? ? ?Past Medical History:  ?Diagnosis Date  ? Anxiety and depression 11/04/2013  ? Arthritis   ? BCC (basal cell carcinoma), leg   ? Right calf, L nape of neck at hairline  ? CAD (coronary artery disease)   ? CP, cath, non-obstructive  ? Diverticulosis   ? colon  ? Erectile dysfunction following radical prostatectomy   ? Hyperlipidemia   ? Hypertension   ? Insomnia   ? Melanoma ([place]) 2010  ? face, sees derm   ? Prostate CA ([place]) 10/08  ? s/p robotic surgery  ? Spermatocele   ? Left  ? ? ?Past Surgical History:  ?Procedure Laterality Date  ? APPENDECTOMY    ? HERNIA REPAIR    ? KNEE ARTHROSCOPY Left   ? meniscal tear  ? PROSTATECTOMY  07-2007  ?  robotic,  for prostate cancer    ? SHOULDER ARTHROSCOPY  07-09-2013  ? Dr Tamera Punt   ? TRANSFORAMINAL LUMBAR INTERBODY FUSION (TLIF) WITH PEDICLE SCREW FIXATION 1 LEVEL Right 12/04/2019  ? Procedure: RIGHT-SIDED TRANSFORAMINAL LUMBAR INTERBODY FUSION LUMBAR FOUR THROUGH FIVE WITH INSTRUMENTATION AND ALLOGRAFT;  Surgeon: Phylliss Bob, MD;  Location: [facility];  Service: Orthopedics;  Laterality: Right;  ? ? ?Family History  ?Problem Relation Age of Onset  ? Coronary artery disease Father 49  ?     dx age 70s  ? Stroke Mother   ? Hypertension Neg Hx   ? Diabetes Neg Hx   ? Colon cancer Neg Hx   ? Prostate cancer Neg Hx   ? ? ?Social History  ? ?Socioeconomic History  ? Marital status: Married  ?  Spouse name: Not on  file  ? Number of children: 2  ? Years of education: Not on file  ? Highest education level: Not on file  ?Occupational History  ? Occupation: retired Government social research officer   ?Tobacco Use  ? Smoking status: Never  ? Smokeless tobacco: Never  ?Vaping Use  ? Vaping Use: Never used  ?Substance and Sexual Activity  ? Alcohol use: Yes  ?  Alcohol/week: 7.0 standard drinks  ?  Types: 7 Glasses of wine per week  ?  Comment: socially; 1 glass of wine/day  ? Drug use: Never  ? Sexual activity: Not on file  ?Other Topics Concern  ? Not on file  ?Social History Narrative  ? Lives w/ wife   ? Original from [place]  ? ?Social Determinants of Health  ? ?Financial Resource Strain: Not on file  ?Food Insecurity: Not on file  ?Transportation Needs: Not on file  ?Physical Activity: Not on file  ?Stress: Not on file  ?Social Connections: Not on file  ?Intimate Partner Violence: Not on file  ? ? ?Outpatient Medications Prior to Visit  ?Medication Sig Dispense Refill  ? atorvastatin (LIPITOR) 40 MG tablet Take 1 tablet (40 mg total) by mouth at bedtime. [place]  tablet 3  ? Calcium Polycarbophil (FIBER LAXATIVE PO) Take 1 tablet by mouth daily.    ? COVID-19 mRNA bivalent vaccine, Pfizer, (PFIZER COVID-19 VAC BIVALENT) injection Inject into the muscle. 0.3 mL 0  ? enalapril (VASOTEC) 5 MG tablet TAKE ONE TABLET BY MOUTH ONE TIME DAILY 90 tablet 1  ? hydrochlorothiazide (HYDRODIURIL) 25 MG tablet TAKE ONE TABLET BY MOUTH ONE TIME DAILY 90 tablet 1  ? Melatonin 10 MG TABS Take 10 mg by mouth at bedtime.    ? multivitamin (THERAGRAN) per tablet Take 1 tablet by mouth daily.    ? neomycin-polymyxin-hydrocortisone (CORTISPORIN) 3.5-10000-1 OTIC suspension Place 4 drops into the right ear 3 (three) times daily. 10 mL 0  ? traZODone (DESYREL) 100 MG tablet TAKE ONE TABLET BY MOUTH DAILY AT BEDTIME 90 tablet 0  ? vitamin E 100 UNIT capsule Take 100 Units by mouth daily.    ? zolpidem (AMBIEN) 10 MG tablet TAKE HALF TO ONE TABLET BY MOUTH AT BEDTIME AS  NEEDED FOR SLEEP 30 tablet 3  ? ?No facility-administered medications prior to visit.  ? ? ?Allergies  ?Allergen Reactions  ? Aspirin Other (See Comments)  ?  REACTION: swelling in face  ? Hydrocodone   ?  Insomnia, mild SOB (w/o cough-wheezing)  ? Penicillins Other (See Comments)  ?  REACTION: swelling in face ? ?Did it involve swelling of the face/tongue/throat, SOB, or low BP? Yes ?Did it involve sudden or severe rash/hives, skin peeling, or any reaction on the inside of your mouth or nose? No ?Did you need to seek medical attention at a hospital or doctor's office? Yes ?When did it last happen?      [id number] played Hockey ?If all above answers are ?NO?, may proceed with cephalosporin use.  ? ? ?Review of Systems ?All review of systems negative except what is listed in the HPI ? ?   ?Objective:  ?  ?Physical Exam ?Vitals reviewed.  ?Constitutional:   ?   Appearance: Normal appearance.  ?HENT:  ?   Head: Normocephalic and atraumatic.  ?   Right Ear: Tympanic membrane normal.  ?   Left Ear: Tympanic membrane normal.  ?   Nose: Congestion present.  ?   Mouth/Throat:  ?   Mouth: Mucous membranes are moist.  ?   Pharynx: Oropharynx is clear.  ?Eyes:  ?   Conjunctiva/sclera: Conjunctivae normal.  ?Cardiovascular:  ?   Rate and Rhythm: Normal rate and regular rhythm.  ?Pulmonary:  ?   Effort: Pulmonary effort is normal.  ?   Breath sounds: Normal breath sounds.  ?Musculoskeletal:  ?   Cervical back: Normal range of motion and neck supple.  ?Skin: ?   General: Skin is warm and dry.  ?Neurological:  ?   General: No focal deficit present.  ?   Mental Status: He is alert and oriented to person, place, and time. Mental status is at baseline.  ?Psychiatric:     ?   Mood and Affect: Mood normal.     ?   Behavior: Behavior normal.     ?   Thought Content: Thought content normal.     ?   Judgment: Judgment normal.  ? ? ?BP (!) 141/71   Pulse 81   Temp 98.5 ?F (36.9 ?C)   Ht '5\' 10"'$  (1.778 m)   Wt 235 lb (106.6 kg)   SpO2  96%   BMI 33.72 kg/m?  ?Wt Readings from Last 3 Encounters:  ?12/29/21 235 lb (  106.6 kg)  ?08/18/21 233 lb 4 oz (105.8 kg)  ?03/18/21 228 lb (103.4 kg)  ? ? ?There are no preventive care reminders to display for this patient. ? ?There are no preventive care reminders to display for this patient. ? ? ?Lab Results  ?Component Value Date  ? TSH 1.13 07/25/2017  ? ?Lab Results  ?Component Value Date  ? WBC 8.0 12/16/2020  ? HGB 15.8 12/16/2020  ? HCT 47.1 12/16/2020  ? MCV 91.2 12/16/2020  ? PLT 327.0 12/16/2020  ? ?Lab Results  ?Component Value Date  ? NA 141 08/18/2021  ? K 4.2 08/18/2021  ? CO2 31 08/18/2021  ? GLUCOSE 105 (H) 08/18/2021  ? BUN 21 08/18/2021  ? CREATININE 0.84 08/18/2021  ? BILITOT 0.7 12/16/2020  ? ALKPHOS 86 12/16/2020  ? AST 24 03/18/2021  ? ALT 43 03/18/2021  ? PROT 6.8 12/16/2020  ? ALBUMIN 4.3 12/16/2020  ? CALCIUM 9.7 08/18/2021  ? ANIONGAP 9 11/27/2019  ? GFR 83.96 08/18/2021  ? ?Lab Results  ?Component Value Date  ? CHOL 140 03/18/2021  ? ?Lab Results  ?Component Value Date  ? HDL 38.20 (L) 03/18/2021  ? ?Lab Results  ?Component Value Date  ? [place] 56 02/19/2013  ? ?Lab Results  ?Component Value Date  ? TRIG 354.0 (H) 03/18/2021  ? ?Lab Results  ?Component Value Date  ? CHOLHDL 4 03/18/2021  ? ?Lab Results  ?Component Value Date  ? HGBA1C 6.1 08/18/2021  ? ? ?   ?Assessment & Plan:  ? ?1. Acute non-recurrent pansinusitis ?Start antibiotics (doxy) and Flonase. ?Continue supportive measures including rest, hydration, humidifier use, steam showers, warm compresses to sinuses, warm liquids with lemon and honey, and over-the-counter cough, cold, and analgesics as needed.  ? ? ?- doxycycline (VIBRA-TABS) 100 MG tablet; Take 1 tablet (100 mg total) by mouth 2 (two) times daily for 7 days.  Dispense: 14 tablet; Refill: 0 ?- fluticasone (FLONASE) 50 MCG/ACT nasal spray; Instill 2 sprays into both nostrils daily.  Dispense: 16 g; Refill: 0 ? ? ? ?Please contact office for follow-up if symptoms do  not improve or worsen. Seek emergency care if symptoms become severe. ? ?Patient aware of signs/symptoms requiring further/urgent evaluation.  ? ? ?Terrilyn Saver, NP ? ?

## 2021-12-29 NOTE — Progress Notes (Signed)
Sx for 2 weeks ?Nasal and chest congestion ?Headache ?Dry cough ?Only taken regular tylenol ?

## 2021-12-29 NOTE — Patient Instructions (Signed)
Start antibiotics and Flonase. ?Continue supportive measures including rest, hydration, humidifier use, steam showers, warm compresses to sinuses, warm liquids with lemon and honey, and over-the-counter cough, cold, and analgesics as needed.  ? ?

## 2022-01-01 DIAGNOSIS — Z20822 Contact with and (suspected) exposure to covid-19: Secondary | ICD-10-CM | POA: Diagnosis not present

## 2022-01-04 ENCOUNTER — Telehealth: Payer: Self-pay | Admitting: Internal Medicine

## 2022-01-04 NOTE — Telephone Encounter (Signed)
Left message for patient to call back and schedule Medicare Annual Wellness Visit (AWV) in office.  ? ?If not able to come in office, please offer to do virtually or by telephone.  Left office number and my jabber [phone]. ? ?Last AWV:07/22/2016 ? ?Please schedule at anytime with Nurse Health Advisor. ?  ?

## 2022-01-19 ENCOUNTER — Telehealth: Payer: Self-pay | Admitting: Internal Medicine

## 2022-01-20 NOTE — Telephone Encounter (Signed)
Rx denied. Letter sent to Pt on 12/24/21 that he is overdue for visit.  ?

## 2022-01-20 NOTE — Telephone Encounter (Signed)
Requesting:ambien 10 mg ?Contract:08/18/21 ?UDS:04/16/20 ?Last Visit:12/29/21 ?Next Visit:unknown ?Last Refill:09/20/21 ? ?Please Advise  ?

## 2022-01-21 DIAGNOSIS — Z20822 Contact with and (suspected) exposure to covid-19: Secondary | ICD-10-CM | POA: Diagnosis not present

## 2022-01-22 ENCOUNTER — Other Ambulatory Visit: Payer: Self-pay | Admitting: Internal Medicine

## 2022-01-23 ENCOUNTER — Encounter: Payer: Self-pay | Admitting: Internal Medicine

## 2022-01-25 ENCOUNTER — Ambulatory Visit (INDEPENDENT_AMBULATORY_CARE_PROVIDER_SITE_OTHER): Payer: Medicare Other | Admitting: Internal Medicine

## 2022-01-25 ENCOUNTER — Encounter: Payer: Self-pay | Admitting: Internal Medicine

## 2022-01-25 VITALS — BP 152/84 | HR 92 | Temp 97.7°F | Resp 16 | Ht 70.0 in | Wt 238.0 lb

## 2022-01-25 DIAGNOSIS — Z8546 Personal history of malignant neoplasm of prostate: Secondary | ICD-10-CM

## 2022-01-25 DIAGNOSIS — I10 Essential (primary) hypertension: Secondary | ICD-10-CM | POA: Diagnosis not present

## 2022-01-25 DIAGNOSIS — Z23 Encounter for immunization: Secondary | ICD-10-CM | POA: Diagnosis not present

## 2022-01-25 DIAGNOSIS — R739 Hyperglycemia, unspecified: Secondary | ICD-10-CM | POA: Diagnosis not present

## 2022-01-25 DIAGNOSIS — E785 Hyperlipidemia, unspecified: Secondary | ICD-10-CM | POA: Diagnosis not present

## 2022-01-25 DIAGNOSIS — G4700 Insomnia, unspecified: Secondary | ICD-10-CM | POA: Diagnosis not present

## 2022-01-25 MED ORDER — ZOLPIDEM TARTRATE 10 MG PO TABS: ORAL_TABLET | ORAL | 3 refills | Status: DC

## 2022-01-25 NOTE — Assessment & Plan Note (Signed)
Hyperglycemia: check a1c, diet d/w pt ?HTN: BP slightly elevated, recommend to check at home and call in 1 month w/ readings.  CMP, CBC.  Continue present care ?Hyperlipidemia: On atorvastatin, checking labs ?Anxiety, depression, insomnia: On trazodone for depression, on Ambien for insomnia.  Prescription printed.sxs controlled ?History of skin cancer: Has not seen dermatology regularly, recommend self skin check. ?Preventive care reviewed ?RTC 4 months. ?

## 2022-01-25 NOTE — Progress Notes (Signed)
? ?Subjective:  ? ? Patient ID: Larry Elliott, male    DOB: 1946-01-24, 76 y.o.   MRN: [id number] ? ?DOS:  01/25/2022 ?Type of visit - description: f/u ?Since the last office visit is doing well.  Requesting a refill on Ambien.  ? ?BP Readings from Last 3 Encounters:  ?01/25/22 (!) 152/84  ?12/29/21 (!) 141/71  ?08/18/21 138/72  ? ? ?Review of Systems ?Denies chest pain or difficulty breathing. ?Lower extremity edema at baseline ?Denies any dysuria, gross hematuria or difficulty urinating.  No urinary incontinence ? ?Past Medical History:  ?Diagnosis Date  ? Anxiety and depression 11/04/2013  ? Arthritis   ? BCC (basal cell carcinoma), leg   ? Right calf, L nape of neck at hairline  ? CAD (coronary artery disease)   ? CP, cath, non-obstructive  ? Diverticulosis   ? colon  ? Erectile dysfunction following radical prostatectomy   ? Hyperlipidemia   ? Hypertension   ? Insomnia   ? Melanoma ([place]) 2010  ? face, sees derm   ? Prostate CA ([place]) 10/08  ? s/p robotic surgery  ? Spermatocele   ? Left  ? ? ?Past Surgical History:  ?Procedure Laterality Date  ? APPENDECTOMY    ? HERNIA REPAIR    ? KNEE ARTHROSCOPY Left   ? meniscal tear  ? PROSTATECTOMY  07-2007  ?  robotic,  for prostate cancer    ? SHOULDER ARTHROSCOPY  07-09-2013  ? Dr Tamera Punt   ? TRANSFORAMINAL LUMBAR INTERBODY FUSION (TLIF) WITH PEDICLE SCREW FIXATION 1 LEVEL Right 12/04/2019  ? Procedure: RIGHT-SIDED TRANSFORAMINAL LUMBAR INTERBODY FUSION LUMBAR FOUR THROUGH FIVE WITH INSTRUMENTATION AND ALLOGRAFT;  Surgeon: Phylliss Bob, MD;  Location: [facility];  Service: Orthopedics;  Laterality: Right;  ? ? ?Current Outpatient Medications  ?Medication Instructions  ? atorvastatin (LIPITOR) 40 mg, Oral, Daily at bedtime  ? Calcium Polycarbophil (FIBER LAXATIVE PO) 1 tablet, Oral, Daily,    ? enalapril (VASOTEC) 5 MG tablet TAKE ONE TABLET BY MOUTH ONE TIME DAILY  ? fluticasone (FLONASE) 50 MCG/ACT nasal spray Instill 2 sprays into both nostrils daily.  ?  hydrochlorothiazide (HYDRODIURIL) 25 MG tablet TAKE ONE TABLET BY MOUTH ONE TIME DAILY  ? Melatonin 10 mg, Oral, Daily at bedtime  ? multivitamin (THERAGRAN) per tablet 1 tablet, Oral, Daily,    ? traZODone (DESYREL) 100 MG tablet TAKE ONE TABLET BY MOUTH DAILY AT BEDTIME  ? vitamin E 100 Units, Oral, Daily,    ? zolpidem (AMBIEN) 10 MG tablet TAKE HALF TO ONE TABLET BY MOUTH AT BEDTIME AS NEEDED FOR SLEEP  ? ? ?   ?Objective:  ? Physical Exam ?BP (!) 152/84 (BP Location: Left Arm, Patient Position: Sitting, Cuff Size: Small)   Pulse 92   Temp 97.7 ?F (36.5 ?C) (Oral)   Resp 16   Ht '5\' 10"'$  (1.778 m)   Wt 238 lb (108 kg)   SpO2 94%   BMI 34.15 kg/m?  ?General:   ?Well developed, NAD, BMI noted.  ?HEENT:  ?Normocephalic . Face symmetric, atraumatic ?Lungs:  ?CTA B ?Normal respiratory effort, no intercostal retractions, no accessory muscle use. ?Heart: RRR,  no murmur.  ?Abdomen:  ?Not distended, soft, non-tender. No rebound or rigidity.   ?Skin: Not pale. Not jaundice ?Lower extremities: Trace edema pretibial edema bilaterally  ?Neurologic:  ?alert & oriented X3.  ?Speech normal, gait appropriate for age and unassisted ?Psych--  ?Cognition and judgment appear intact.  ?Cooperative with normal attention span and concentration.  ?  Behavior appropriate. ?No anxious or depressed appearing. ? ?   ?Assessment   ? ? Assessment  ?Hyperglycemia c ?HTN ?Hyperlipidemia ?Anxiety depression insomnia  dc clonazepam - not effective,  8-16, rx trazodone  ?CAD, non-obstructive ?Prostate cancer-- 2008, robotic surgery, no incontinence, Dr Alinda Money: now f/u per PCP as of 05-2018 ?DJD ?ED past surgery  ?spermatocele, left ?Skin --Melanoma 2010, BCCs  does not see derm regularly as off 01-2022 ?LE edema R>L (-) US DVT 2020 ?ABI 09/2019 WNL ? ?PLAN: ?Hyperglycemia: check a1c, diet d/w pt ?HTN: BP slightly elevated, recommend to check at home and call in 1 month w/ readings.  CMP, CBC.  Continue present care ?Hyperlipidemia: On  atorvastatin, checking labs ?Anxiety, depression, insomnia: On trazodone for depression, on Ambien for insomnia.  Prescription printed.sxs controlled ?History of skin cancer: Has not seen dermatology regularly, recommend self skin check. ?Preventive care reviewed ?RTC 4 months. ?  ? ?This visit occurred during the SARS-CoV-2 public health emergency.  Safety protocols were in place, including screening questions prior to the visit, additional usage of staff PPE, and extensive cleaning of exam room while observing appropriate contact time as indicated for disinfecting solutions.  ? ?

## 2022-01-25 NOTE — Patient Instructions (Addendum)
Check the  blood pressure regularly ?BP GOAL is between 110/65 and  135/85. ?Call with your blood pressure readings in 1 month. ? ?  ?GO TO THE LAB : Get the blood work   ? ? ?[place], [place] ?Come back for a checkup in 4 months ?

## 2022-01-25 NOTE — Assessment & Plan Note (Signed)
-  Td  2020 ?- PNM 23: 2014 ?-prevnar-- 2015 ?- PNM 20: Today ?-zostavax 2011 ?- s/p shingrix  ?- covid vax : utd ?-CCS: Colonoscopy 2003 ,   09-2010, 09-2014 >>> next 2020, was referred last year, did not pursue it.  He is not interested in further colonoscopies  ?-H/o prostate ca : Released from urology care 05-2018, last PSA 0.0  11-2020.  Check a PSA ?ACP: On file ?  ? ?

## 2022-01-26 LAB — COMPREHENSIVE METABOLIC PANEL
ALT: 48 U/L (ref 0–53)
AST: 29 U/L (ref 0–37)
Albumin: 4.5 g/dL (ref 3.5–5.2)
Alkaline Phosphatase: 73 U/L (ref 39–117)
BUN: 22 mg/dL (ref 6–23)
CO2: 29 mEq/L (ref 19–32)
Calcium: 10 mg/dL (ref 8.4–10.5)
Chloride: 102 mEq/L (ref 96–112)
Creatinine, Ser: 0.79 mg/dL (ref 0.40–1.50)
GFR: 85.27 mL/min (ref >60.00)
Glucose, Bld: 88 mg/dL (ref 70–99)
Potassium: 4 mEq/L (ref 3.5–5.1)
Sodium: 140 mEq/L (ref 135–145)
Total Bilirubin: 1 mg/dL (ref 0.2–1.2)
Total Protein: 6.7 g/dL (ref 6.0–8.3)

## 2022-01-26 LAB — CBC WITH DIFFERENTIAL/PLATELET
Basophils Absolute: 0.1 10*3/uL (ref 0.0–0.1)
Basophils Relative: 1.1 % (ref 0.0–3.0)
Eosinophils Absolute: 0.2 10*3/uL (ref 0.0–0.7)
Eosinophils Relative: 2.3 % (ref 0.0–5.0)
HCT: 45.1 % (ref 39.0–52.0)
Hemoglobin: 15.1 g/dL (ref 13.0–17.0)
Lymphocytes Relative: 18.2 % (ref 12.0–46.0)
Lymphs Abs: 1.4 10*3/uL (ref 0.7–4.0)
MCHC: 33.6 g/dL (ref 30.0–36.0)
MCV: 91.9 fl (ref 78.0–100.0)
Monocytes Absolute: 0.8 10*3/uL (ref 0.1–1.0)
Monocytes Relative: 10.7 % (ref 3.0–12.0)
Neutro Abs: 5.1 10*3/uL (ref 1.4–7.7)
Neutrophils Relative %: 67.7 % (ref 43.0–77.0)
Platelets: 287 10*3/uL (ref 150.0–400.0)
RBC: 4.9 Mil/uL (ref 4.22–5.81)
RDW: 14.3 % (ref 11.5–15.5)
WBC: 7.6 10*3/uL (ref 4.0–10.5)

## 2022-01-26 LAB — LIPID PANEL
Cholesterol: 204 mg/dL — ABNORMAL HIGH (ref 0–200)
HDL: 47.7 mg/dL (ref >39.00)
Total CHOL/HDL Ratio: 4
Triglycerides: 584 mg/dL — ABNORMAL HIGH (ref 0.0–149.0)

## 2022-01-26 LAB — LDL CHOLESTEROL, DIRECT: Direct LDL: 82 mg/dL

## 2022-01-26 LAB — PSA: PSA: 0 ng/mL — ABNORMAL LOW (ref 0.10–4.00)

## 2022-01-26 LAB — HEMOGLOBIN A1C: Hgb A1c MFr Bld: 6.1 % (ref 4.6–6.5)

## 2022-01-27 MED ORDER — FENOFIBRATE 134 MG PO CAPS: 134.0000 mg | ORAL_CAPSULE | Freq: Every day | ORAL | 1 refills | Status: DC

## 2022-01-27 NOTE — Addendum Note (Signed)
Addended byDamita Dunnings D on: 01/27/2022 12:34 PM ? ? Modules accepted: Orders ? ?

## 2022-02-01 ENCOUNTER — Encounter: Payer: Self-pay | Admitting: Internal Medicine

## 2022-02-02 DIAGNOSIS — Z20822 Contact with and (suspected) exposure to covid-19: Secondary | ICD-10-CM | POA: Diagnosis not present

## 2022-02-07 DIAGNOSIS — Z20822 Contact with and (suspected) exposure to covid-19: Secondary | ICD-10-CM | POA: Diagnosis not present

## 2022-02-23 DIAGNOSIS — Z20822 Contact with and (suspected) exposure to covid-19: Secondary | ICD-10-CM | POA: Diagnosis not present

## 2022-02-28 DIAGNOSIS — Z20822 Contact with and (suspected) exposure to covid-19: Secondary | ICD-10-CM | POA: Diagnosis not present

## 2022-03-09 ENCOUNTER — Telehealth: Payer: Self-pay | Admitting: Internal Medicine

## 2022-03-09 NOTE — Telephone Encounter (Signed)
Left message for patient to call back and schedule Medicare Annual Wellness Visit (AWV).  ? ?Please offer to do virtually or by telephone.  Left office number and my jabber [phone]. ? ?Last AWV:07/22/2016 ? ?Please schedule at anytime with Nurse Health Advisor. ?  ?

## 2022-03-29 DIAGNOSIS — M5416 Radiculopathy, lumbar region: Secondary | ICD-10-CM | POA: Diagnosis not present

## 2022-03-30 DIAGNOSIS — Z961 Presence of intraocular lens: Secondary | ICD-10-CM | POA: Diagnosis not present

## 2022-03-30 DIAGNOSIS — H43813 Vitreous degeneration, bilateral: Secondary | ICD-10-CM | POA: Diagnosis not present

## 2022-03-30 DIAGNOSIS — H401411 Capsular glaucoma with pseudoexfoliation of lens, right eye, mild stage: Secondary | ICD-10-CM | POA: Diagnosis not present

## 2022-03-30 DIAGNOSIS — Q141 Congenital malformation of retina: Secondary | ICD-10-CM | POA: Diagnosis not present

## 2022-03-31 DIAGNOSIS — M545 Low back pain, unspecified: Secondary | ICD-10-CM | POA: Diagnosis not present

## 2022-04-02 DIAGNOSIS — M5416 Radiculopathy, lumbar region: Secondary | ICD-10-CM | POA: Diagnosis not present

## 2022-04-04 DIAGNOSIS — M48062 Spinal stenosis, lumbar region with neurogenic claudication: Secondary | ICD-10-CM | POA: Diagnosis not present

## 2022-04-17 ENCOUNTER — Other Ambulatory Visit: Payer: Self-pay | Admitting: Internal Medicine

## 2022-04-24 ENCOUNTER — Other Ambulatory Visit: Payer: Self-pay | Admitting: Internal Medicine

## 2022-04-28 DIAGNOSIS — M48062 Spinal stenosis, lumbar region with neurogenic claudication: Secondary | ICD-10-CM | POA: Diagnosis not present

## 2022-05-16 ENCOUNTER — Emergency Department (HOSPITAL_COMMUNITY): Payer: Medicare Other

## 2022-05-16 ENCOUNTER — Inpatient Hospital Stay (HOSPITAL_COMMUNITY): Payer: Medicare Other

## 2022-05-16 ENCOUNTER — Inpatient Hospital Stay (HOSPITAL_COMMUNITY)
Admission: EM | Admit: 2022-05-16 | Discharge: 2022-05-18 | DRG: 092 | Disposition: A | Discharge status: Home / Self Care | Payer: Medicare Other | Attending: Internal Medicine | Admitting: Internal Medicine

## 2022-05-16 ENCOUNTER — Ambulatory Visit: Payer: Medicare Other | Admitting: Internal Medicine

## 2022-05-16 DIAGNOSIS — G9340 Encephalopathy, unspecified: Secondary | ICD-10-CM | POA: Diagnosis not present

## 2022-05-16 DIAGNOSIS — G4700 Insomnia, unspecified: Secondary | ICD-10-CM | POA: Diagnosis not present

## 2022-05-16 DIAGNOSIS — Z8546 Personal history of malignant neoplasm of prostate: Secondary | ICD-10-CM | POA: Diagnosis not present

## 2022-05-16 DIAGNOSIS — Z886 Allergy status to analgesic agent status: Secondary | ICD-10-CM | POA: Diagnosis not present

## 2022-05-16 DIAGNOSIS — Z79899 Other long term (current) drug therapy: Secondary | ICD-10-CM | POA: Diagnosis not present

## 2022-05-16 DIAGNOSIS — R0689 Other abnormalities of breathing: Secondary | ICD-10-CM | POA: Diagnosis not present

## 2022-05-16 DIAGNOSIS — E876 Hypokalemia: Secondary | ICD-10-CM | POA: Diagnosis not present

## 2022-05-16 DIAGNOSIS — N5231 Erectile dysfunction following radical prostatectomy: Secondary | ICD-10-CM | POA: Diagnosis present

## 2022-05-16 DIAGNOSIS — M7989 Other specified soft tissue disorders: Secondary | ICD-10-CM | POA: Diagnosis not present

## 2022-05-16 DIAGNOSIS — Z880 Allergy status to penicillin: Secondary | ICD-10-CM | POA: Diagnosis not present

## 2022-05-16 DIAGNOSIS — Z8249 Family history of ischemic heart disease and other diseases of the circulatory system: Secondary | ICD-10-CM

## 2022-05-16 DIAGNOSIS — W19XXXA Unspecified fall, initial encounter: Secondary | ICD-10-CM | POA: Diagnosis present

## 2022-05-16 DIAGNOSIS — R404 Transient alteration of awareness: Secondary | ICD-10-CM

## 2022-05-16 DIAGNOSIS — Z981 Arthrodesis status: Secondary | ICD-10-CM | POA: Diagnosis not present

## 2022-05-16 DIAGNOSIS — F419 Anxiety disorder, unspecified: Secondary | ICD-10-CM | POA: Diagnosis not present

## 2022-05-16 DIAGNOSIS — R4182 Altered mental status, unspecified: Secondary | ICD-10-CM | POA: Diagnosis not present

## 2022-05-16 DIAGNOSIS — Y92009 Unspecified place in unspecified non-institutional (private) residence as the place of occurrence of the external cause: Secondary | ICD-10-CM

## 2022-05-16 DIAGNOSIS — G928 Other toxic encephalopathy: Principal | ICD-10-CM | POA: Diagnosis present

## 2022-05-16 DIAGNOSIS — Z85820 Personal history of malignant melanoma of skin: Secondary | ICD-10-CM

## 2022-05-16 DIAGNOSIS — F32A Depression, unspecified: Secondary | ICD-10-CM | POA: Diagnosis not present

## 2022-05-16 DIAGNOSIS — R9431 Abnormal electrocardiogram [ECG] [EKG]: Secondary | ICD-10-CM | POA: Diagnosis not present

## 2022-05-16 DIAGNOSIS — Z85828 Personal history of other malignant neoplasm of skin: Secondary | ICD-10-CM

## 2022-05-16 DIAGNOSIS — E785 Hyperlipidemia, unspecified: Secondary | ICD-10-CM | POA: Diagnosis present

## 2022-05-16 DIAGNOSIS — Z885 Allergy status to narcotic agent status: Secondary | ICD-10-CM

## 2022-05-16 DIAGNOSIS — Z974 Presence of external hearing-aid: Secondary | ICD-10-CM | POA: Diagnosis not present

## 2022-05-16 DIAGNOSIS — I2510 Atherosclerotic heart disease of native coronary artery without angina pectoris: Secondary | ICD-10-CM | POA: Diagnosis present

## 2022-05-16 DIAGNOSIS — M1990 Unspecified osteoarthritis, unspecified site: Secondary | ICD-10-CM | POA: Diagnosis not present

## 2022-05-16 DIAGNOSIS — I10 Essential (primary) hypertension: Secondary | ICD-10-CM | POA: Diagnosis not present

## 2022-05-16 DIAGNOSIS — R109 Unspecified abdominal pain: Secondary | ICD-10-CM | POA: Diagnosis not present

## 2022-05-16 DIAGNOSIS — R0609 Other forms of dyspnea: Secondary | ICD-10-CM | POA: Diagnosis not present

## 2022-05-16 DIAGNOSIS — Z9889 Other specified postprocedural states: Secondary | ICD-10-CM | POA: Diagnosis not present

## 2022-05-16 DIAGNOSIS — R918 Other nonspecific abnormal finding of lung field: Secondary | ICD-10-CM | POA: Diagnosis not present

## 2022-05-16 DIAGNOSIS — I639 Cerebral infarction, unspecified: Secondary | ICD-10-CM | POA: Diagnosis not present

## 2022-05-16 DIAGNOSIS — T50915A Adverse effect of multiple unspecified drugs, medicaments and biological substances, initial encounter: Secondary | ICD-10-CM | POA: Diagnosis present

## 2022-05-16 DIAGNOSIS — K567 Ileus, unspecified: Secondary | ICD-10-CM | POA: Diagnosis not present

## 2022-05-16 LAB — I-STAT CHEM 8, ED
BUN: 13 mg/dL (ref 8–23)
Calcium, Ion: 1.12 mmol/L — ABNORMAL LOW (ref 1.15–1.40)
Chloride: 108 mmol/L (ref 98–111)
Creatinine, Ser: 0.6 mg/dL — ABNORMAL LOW (ref 0.61–1.24)
Glucose, Bld: 107 mg/dL — ABNORMAL HIGH (ref 70–99)
HCT: 42 % (ref 39.0–52.0)
Hemoglobin: 14.3 g/dL (ref 13.0–17.0)
Potassium: 3.6 mmol/L (ref 3.5–5.1)
Sodium: 143 mmol/L (ref 135–145)
TCO2: 23 mmol/L (ref 22–32)

## 2022-05-16 LAB — CBC
HCT: 42.5 % (ref 39.0–52.0)
Hemoglobin: 14.4 g/dL (ref 13.0–17.0)
MCH: 30.6 pg (ref 26.0–34.0)
MCHC: 33.9 g/dL (ref 30.0–36.0)
MCV: 90.2 fL (ref 80.0–100.0)
Platelets: 342 10*3/uL (ref 150–400)
RBC: 4.71 MIL/uL (ref 4.22–5.81)
RDW: 13.8 % (ref 11.5–15.5)
WBC: 9 10*3/uL (ref 4.0–10.5)
nRBC: 0 % (ref 0.0–0.2)

## 2022-05-16 LAB — COMPREHENSIVE METABOLIC PANEL
ALT: 91 U/L — ABNORMAL HIGH (ref 0–44)
AST: 44 U/L — ABNORMAL HIGH (ref 15–41)
Albumin: 3.1 g/dL — ABNORMAL LOW (ref 3.5–5.0)
Alkaline Phosphatase: 84 U/L (ref 38–126)
Anion gap: 7 (ref 5–15)
BUN: 13 mg/dL (ref 8–23)
CO2: 24 mmol/L (ref 22–32)
Calcium: 8.9 mg/dL (ref 8.9–10.3)
Chloride: 111 mmol/L (ref 98–111)
Creatinine, Ser: 0.76 mg/dL (ref 0.61–1.24)
GFR, Estimated: >60 mL/min (ref >60)
Glucose, Bld: 109 mg/dL — ABNORMAL HIGH (ref 70–99)
Potassium: 3.7 mmol/L (ref 3.5–5.1)
Sodium: 142 mmol/L (ref 135–145)
Total Bilirubin: 0.5 mg/dL (ref 0.3–1.2)
Total Protein: 6.2 g/dL — ABNORMAL LOW (ref 6.5–8.1)

## 2022-05-16 LAB — POCT I-STAT 7, (LYTES, BLD GAS, ICA,H+H)
Acid-Base Excess: 0 mmol/L (ref 0.0–2.0)
Bicarbonate: 25 mmol/L (ref 20.0–28.0)
Calcium, Ion: 1.24 mmol/L (ref 1.15–1.40)
HCT: 36 % — ABNORMAL LOW (ref 39.0–52.0)
Hemoglobin: 12.2 g/dL — ABNORMAL LOW (ref 13.0–17.0)
O2 Saturation: 98 %
Patient temperature: 98.4
Potassium: 3.9 mmol/L (ref 3.5–5.1)
Sodium: 143 mmol/L (ref 135–145)
TCO2: 26 mmol/L (ref 22–32)
pCO2 arterial: 42.7 mmHg (ref 32–48)
pH, Arterial: 7.375 (ref 7.35–7.45)
pO2, Arterial: 116 mmHg — ABNORMAL HIGH (ref 83–108)

## 2022-05-16 LAB — ECHOCARDIOGRAM COMPLETE
AR max vel: 3.3 cm2
AV Peak grad: 6 mmHg
Ao pk vel: 1.22 m/s
Area-P 1/2: 2.87 cm2
S' Lateral: 3.9 cm
Weight: 3746.06 oz

## 2022-05-16 LAB — DIFFERENTIAL
Abs Immature Granulocytes: 0.06 10*3/uL (ref 0.00–0.07)
Basophils Absolute: 0 10*3/uL (ref 0.0–0.1)
Basophils Relative: 0 %
Eosinophils Absolute: 0.2 10*3/uL (ref 0.0–0.5)
Eosinophils Relative: 2 %
Immature Granulocytes: 1 %
Lymphocytes Relative: 16 %
Lymphs Abs: 1.4 10*3/uL (ref 0.7–4.0)
Monocytes Absolute: 1 10*3/uL (ref 0.1–1.0)
Monocytes Relative: 11 %
Neutro Abs: 6.3 10*3/uL (ref 1.7–7.7)
Neutrophils Relative %: 70 %

## 2022-05-16 LAB — SALICYLATE LEVEL: Salicylate Lvl: <7 mg/dL — ABNORMAL LOW (ref 7.0–30.0)

## 2022-05-16 LAB — URINALYSIS, ROUTINE W REFLEX MICROSCOPIC
Bilirubin Urine: NEGATIVE
Glucose, UA: NEGATIVE mg/dL
Hgb urine dipstick: NEGATIVE
Ketones, ur: NEGATIVE mg/dL
Leukocytes,Ua: NEGATIVE
Nitrite: NEGATIVE
Protein, ur: NEGATIVE mg/dL
Specific Gravity, Urine: 1.029 (ref 1.005–1.030)
pH: 5 (ref 5.0–8.0)

## 2022-05-16 LAB — RAPID URINE DRUG SCREEN, HOSP PERFORMED
Amphetamines: NOT DETECTED
Barbiturates: NOT DETECTED
Benzodiazepines: POSITIVE — AB
Cocaine: NOT DETECTED
Opiates: NOT DETECTED
Tetrahydrocannabinol: NOT DETECTED

## 2022-05-16 LAB — GLUCOSE, CAPILLARY
Glucose-Capillary: 128 mg/dL — ABNORMAL HIGH (ref 70–99)
Glucose-Capillary: 136 mg/dL — ABNORMAL HIGH (ref 70–99)
Glucose-Capillary: 144 mg/dL — ABNORMAL HIGH (ref 70–99)
Glucose-Capillary: 96 mg/dL (ref 70–99)

## 2022-05-16 LAB — PROTIME-INR
INR: 0.9 (ref 0.8–1.2)
Prothrombin Time: 12.5 seconds (ref 11.4–15.2)

## 2022-05-16 LAB — CBG MONITORING, ED: Glucose-Capillary: 116 mg/dL — ABNORMAL HIGH (ref 70–99)

## 2022-05-16 LAB — ACETAMINOPHEN LEVEL: Acetaminophen (Tylenol), Serum: <10 ug/mL — ABNORMAL LOW (ref 10–30)

## 2022-05-16 LAB — TROPONIN I (HIGH SENSITIVITY)
Troponin I (High Sensitivity): 11 ng/L (ref <18)
Troponin I (High Sensitivity): 15 ng/L (ref <18)

## 2022-05-16 LAB — FOLATE: Folate: 32 ng/mL (ref >5.9)

## 2022-05-16 LAB — AMMONIA: Ammonia: 31 umol/L (ref 9–35)

## 2022-05-16 LAB — VITAMIN B12: Vitamin B-12: 399 pg/mL (ref 180–914)

## 2022-05-16 LAB — ETHANOL: Alcohol, Ethyl (B): <10 mg/dL (ref <10)

## 2022-05-16 LAB — MRSA NEXT GEN BY PCR, NASAL: MRSA by PCR Next Gen: DETECTED — AB

## 2022-05-16 LAB — TSH: TSH: 1.892 u[IU]/mL (ref 0.350–4.500)

## 2022-05-16 LAB — APTT: aPTT: 27 seconds (ref 24–36)

## 2022-05-16 MED ORDER — HALOPERIDOL LACTATE 5 MG/ML IJ SOLN
5.0000 mg | Freq: Once | INTRAMUSCULAR | Status: AC
  Administered 2022-05-16: 5 mg via INTRAVENOUS
  Filled 2022-05-16: qty 1

## 2022-05-16 MED ORDER — THIAMINE HCL 100 MG/ML IJ SOLN
100.0000 mg | Freq: Every day | INTRAMUSCULAR | Status: DC
  Administered 2022-05-16 – 2022-05-17 (×2): 100 mg via INTRAVENOUS
  Filled 2022-05-16 (×2): qty 2

## 2022-05-16 MED ORDER — FENTANYL CITRATE PF 50 MCG/ML IJ SOSY
100.0000 ug | PREFILLED_SYRINGE | Freq: Once | INTRAMUSCULAR | Status: AC
  Administered 2022-05-16: 100 ug via INTRAVENOUS
  Filled 2022-05-16: qty 2

## 2022-05-16 MED ORDER — LACTATED RINGERS IV BOLUS
1000.0000 mL | Freq: Once | INTRAVENOUS | Status: AC
Start: 2022-05-16 — End: 2022-05-16
  Administered 2022-05-16: 1000 mL via INTRAVENOUS

## 2022-05-16 MED ORDER — ORAL CARE MOUTH RINSE
15.0000 mL | OROMUCOSAL | Status: DC | PRN
Start: 2022-05-16 — End: 2022-05-18

## 2022-05-16 MED ORDER — LORAZEPAM 2 MG/ML IJ SOLN
1.0000 mg | Freq: Once | INTRAMUSCULAR | Status: AC
  Administered 2022-05-16: 1 mg via INTRAVENOUS
  Filled 2022-05-16: qty 1

## 2022-05-16 MED ORDER — DEXMEDETOMIDINE HCL IN NACL 400 MCG/100ML IV SOLN
0.4000 ug/kg/h | INTRAVENOUS | Status: DC
  Administered 2022-05-16 (×2): 1.2 ug/kg/h via INTRAVENOUS
  Administered 2022-05-16 (×2): 0.4 ug/kg/h via INTRAVENOUS
  Administered 2022-05-17: 1.2 ug/kg/h via INTRAVENOUS
  Administered 2022-05-17: 1 ug/kg/h via INTRAVENOUS
  Filled 2022-05-16 (×6): qty 100

## 2022-05-16 MED ORDER — DOCUSATE SODIUM 100 MG PO CAPS: 100.0000 mg | ORAL_CAPSULE | Freq: Two times a day (BID) | ORAL | Status: DC | PRN

## 2022-05-16 MED ORDER — CHLORHEXIDINE GLUCONATE CLOTH 2 % EX PADS
6.0000 | MEDICATED_PAD | Freq: Every day | CUTANEOUS | Status: DC
Start: 2022-05-16 — End: 2022-05-18
  Administered 2022-05-16 – 2022-05-18 (×3): 6 via TOPICAL

## 2022-05-16 MED ORDER — POLYETHYLENE GLYCOL 3350 17 G PO PACK
17.0000 g | PACK | Freq: Every day | ORAL | Status: DC | PRN
Start: 2022-05-16 — End: 2022-05-18
  Filled 2022-05-16: qty 1

## 2022-05-16 MED ORDER — HEPARIN SODIUM (PORCINE) 5000 UNIT/ML IJ SOLN
5000.0000 [IU] | Freq: Three times a day (TID) | INTRAMUSCULAR | Status: DC
  Administered 2022-05-16 – 2022-05-18 (×7): 5000 [IU] via SUBCUTANEOUS
  Filled 2022-05-16 (×6): qty 1

## 2022-05-16 MED ORDER — INSULIN ASPART 100 UNIT/ML IJ SOLN
0.0000 [IU] | INTRAMUSCULAR | Status: DC
  Administered 2022-05-16 – 2022-05-18 (×5): 2 [IU] via SUBCUTANEOUS
  Administered 2022-05-18: 3 [IU] via SUBCUTANEOUS
  Administered 2022-05-18: 2 [IU] via SUBCUTANEOUS

## 2022-05-16 MED ORDER — SODIUM CHLORIDE 0.9% FLUSH
3.0000 mL | Freq: Once | INTRAVENOUS | Status: AC
  Administered 2022-05-16: 3 mL via INTRAVENOUS

## 2022-05-16 MED ORDER — MUPIROCIN 2 % EX OINT
TOPICAL_OINTMENT | Freq: Two times a day (BID) | CUTANEOUS | Status: DC
  Administered 2022-05-17 (×2): 1 via NASAL
  Filled 2022-05-16: qty 22

## 2022-05-16 NOTE — ED Notes (Signed)
Secure chat left by Network engineer for this RN stating that pt's son Larry Elliott would like update on pt and that he is unable to come to this facility because he cares for pt's wife. This RN attempted x2 to call son with no answer. Unable to leave voicemail at this time. Will retry at later time.

## 2022-05-16 NOTE — ED Notes (Signed)
Intermittently calm. Restless, fidgety. Offerred a urinal, no output. Family x2 at [facility].

## 2022-05-16 NOTE — Consult Note (Signed)
NEUROLOGY CONSULTATION NOTE   Date of service: May 16, 2022 Patient Name: Larry Elliott MRN:  [id number] DOB:  08/24/1946 Reason for consult: "confusion" Requesting Provider: Orpah Greek, * _ _ _   _ __   _ __ _ _  __ __   _ __   __ _  History of Present Illness  Larry Elliott is a 76 y.o. male with PMH significant for anxiety, depression, arthritis, CAD, HTN, HLD, insomnia on Trazodone and Ambien, who presents with confusion and found down.  Per EMS, patient was last seen normal at 2200 on 05/15/22. Son went to bed. He was found on the floor at 0100. EMS saw him and patient would not follow commands. He was brought in as a stroke code for AMS and was placed in a C collar.  On arrival, no focal deficit. He is agitated, difficult to redirect and requires coaxing but is able to follow commands, oriented to self and knows the month. He is visibly tachpneic, uncomfortable and placed on oxygen.  CTH without contrast is severely motion degraded but negative for large stroke or ICH.  I attempted to reach out to his wife on the listed phone number in the chart but her phone goes to voicemail. Also attempted to get in touch with the son but unable to get him on the phone.  LKW: 2200 on 05/15/22. mRS: unknown tNKASE: not offered, low suspicion for stroke. Thrombectomy: not offered, low suspicion for stroke. NIHSS components Score: Comment  1a Level of Conscious 0'[]'$  1'[x]'$  2'[]'$  3'[]'$      1b LOC Questions 0'[x]'$  1'[]'$  2'[]'$       1c LOC Commands 0'[x]'$  1'[]'$  2'[]'$       2 Best Gaze 0'[x]'$  1'[]'$  2'[]'$       3 Visual 0'[x]'$  1'[]'$  2'[]'$  3'[]'$      4 Facial Palsy 0'[x]'$  1'[]'$  2'[]'$  3'[]'$      5a Motor Arm - left 0'[x]'$  1'[]'$  2'[]'$  3'[]'$  4'[]'$  UN'[]'$    5b Motor Arm - Right 0'[x]'$  1'[]'$  2'[]'$  3'[]'$  4'[]'$  UN'[]'$    6a Motor Leg - Left 0'[x]'$  1'[]'$  2'[]'$  3'[]'$  4'[]'$  UN'[]'$    6b Motor Leg - Right 0'[x]'$  1'[]'$  2'[]'$  3'[]'$  4'[]'$  UN'[]'$    7 Limb Ataxia 0'[x]'$  1'[]'$  2'[]'$  3'[]'$  UN'[]'$     8 Sensory 0'[x]'$  1'[]'$  2'[]'$  UN'[]'$      9 Best Language 0'[x]'$  1'[]'$  2'[]'$  3'[]'$      10 Dysarthria 0'[x]'$  1'[]'$  2'[]'$  UN'[]'$       11 Extinct. and Inattention 0'[x]'$  1'[]'$  2'[]'$       TOTAL: 1      ROS   Unable to get detailed ROS 2/2 agitation and encephalopathy.  Past History   Past Medical History:  Diagnosis Date   Anxiety and depression 11/04/2013   Arthritis    BCC (basal cell carcinoma), leg    Right calf, L nape of neck at hairline   CAD (coronary artery disease)    CP, cath, non-obstructive   Diverticulosis    colon   Erectile dysfunction following radical prostatectomy    Hyperlipidemia    Hypertension    Insomnia    Melanoma ([place]) 2010   face, sees derm    Prostate CA ([place]) 10/08   s/p robotic surgery   Spermatocele    Left   Past Surgical History:  Procedure Laterality Date   APPENDECTOMY     HERNIA REPAIR     KNEE ARTHROSCOPY Left    meniscal tear   PROSTATECTOMY  07-2007    robotic,  for  prostate cancer     SHOULDER ARTHROSCOPY  07-09-2013   Dr Tamera Punt    TRANSFORAMINAL LUMBAR INTERBODY FUSION (TLIF) WITH PEDICLE SCREW FIXATION 1 LEVEL Right 12/04/2019   Procedure: RIGHT-SIDED TRANSFORAMINAL LUMBAR INTERBODY FUSION LUMBAR FOUR THROUGH FIVE WITH INSTRUMENTATION AND ALLOGRAFT;  Surgeon: Phylliss Bob, MD;  Location: [facility];  Service: Orthopedics;  Laterality: Right;   Family History  Problem Relation Age of Onset   Coronary artery disease Father 24       dx age 52s   Stroke Mother    Hypertension Neg Hx    Diabetes Neg Hx    Colon cancer Neg Hx    Prostate cancer Neg Hx    Social History   Socioeconomic History   Marital status: Married    Spouse name: Not on file   Number of children: 2   Years of education: Not on file   Highest education level: Not on file  Occupational History   Occupation: retired Government social research officer   Tobacco Use   Smoking status: Never   Smokeless tobacco: Never  Vaping Use   Vaping Use: Never used  Substance and Sexual Activity   Alcohol use: Yes    Alcohol/week: 7.0 standard drinks of alcohol    Types: 7 Glasses of wine per week    Comment:  socially; 1 glass of wine/day   Drug use: Never   Sexual activity: Not on file  Other Topics Concern   Not on file  Social History Narrative   Lives w/ wife    Original from [place]   Social Determinants of Health   Financial Resource Strain: Not on file  Food Insecurity: Not on file  Transportation Needs: Not on file  Physical Activity: Not on file  Stress: Not on file  Social Connections: Not on file   Allergies  Allergen Reactions   Aspirin Other (See Comments)    REACTION: swelling in face   Hydrocodone     Insomnia, mild SOB (w/o cough-wheezing)   Penicillins Other (See Comments)    REACTION: swelling in face  Did it involve swelling of the face/tongue/throat, SOB, or low BP? Yes Did it involve sudden or severe rash/hives, skin peeling, or any reaction on the inside of your mouth or nose? No Did you need to seek medical attention at a hospital or doctor's office? Yes When did it last happen?      [id number] played Hockey If all above answers are "NO", may proceed with cephalosporin use.    Medications  (Not in a hospital admission)    Vitals   There were no vitals filed for this visit.   There is no height or weight on file to calculate BMI.  Physical Exam   General: moving around in the bed; tachypneic. HENT: Normal oropharynx and mucosa. Normal external appearance of ears and nose. Neck: Supple, no pain or tenderness  CV: No JVD. No peripheral edema.  Pulmonary: Symmetric Chest rise. Tachypneic. Abdomen: Soft to touch, round, non-tender. Ext: No cyanosis, edema, or deformity Skin: No rash. Normal palpation of skin.   Musculoskeletal: Normal digits and nails by inspection. No clubbing.   Neurologic Examination  Mental status/Cognition: moaning and drowsy. Partially opens eyes to loud voice or vigorous tactile stimulation, oriented to self, month and year, poor attention.  Speech/language: Non fluent, comprehension intact to simple commands, object naming  intact.  Cranial nerves:   CN II Pupils equal and reactive to light, does make eye contact and identify objects in vision  but unable to assess for VF deficit.   CN III,IV,VI EOM intact, no gaze preference or deviation, no nystagmus   CN V normal sensation in V1, V2, and V3 segments bilaterally   CN VII no asymmetry, no nasolabial fold flattening   CN VIII Turns head towards speech   CN IX & X normal palatal elevation, no uvular deviation   CN XI 5/5 head turn and 5/5 shoulder shrug bilaterally   CN XII midline tongue protrusion   Motor:  Muscle bulk: normal, tone normal. Mvmt Root Nerve  Muscle Right Left Comments  SA C5/6 Ax Deltoid     EF C5/6 Mc Biceps 5 5   EE C6/7/8 Rad Triceps 5 5   WF C6/7 Med FCR     WE C7/8 PIN ECU     F Ab C8/T1 U ADM/FDI 5 5   HF L1/2/3 Fem Illopsoas 4+ 4+   KE L2/3/4 Fem Quad     DF L4/5 D Peron Tib Ant 5 5   PF S1/2 Tibial Grc/Sol 5 5    Sensation:  Light touch Intact throughout   Pin prick    Temperature    Vibration   Proprioception    Coordination/Complex Motor:  - Finger to Nose intact BL - Heel to shin unable to get him to do. - Rapid alternating movement unable to get him to do. - Gait: Deferred for patient safety.  Labs   CBC: No results for input(s): "WBC", "NEUTROABS", "HGB", "HCT", "MCV", "PLT" in the last 168 hours.  Basic Metabolic Panel:  Lab Results  Component Value Date   NA 140 01/25/2022   K 4.0 01/25/2022   CO2 29 01/25/2022   GLUCOSE 88 01/25/2022   BUN 22 01/25/2022   CREATININE 0.79 01/25/2022   CALCIUM 10.0 01/25/2022   GFRNONAA >60 11/27/2019   GFRAA >60 11/27/2019   Lipid Panel:  Lab Results  Component Value Date   LDLCALC 56 02/19/2013   HgbA1c:  Lab Results  Component Value Date   HGBA1C 6.1 01/25/2022   Urine Drug Screen: No results found for: "LABOPIA", "COCAINSCRNUR", "LABBENZ", "AMPHETMU", "THCU", "LABBARB"  Alcohol Level No results found for: "ETH"  CT Head without contrast(Personally  reviewed): Motion degraded but no acute abnormalities.  MRI Brain: pending  rEEG:  pending  Impression   Larry Elliott is a 76 y.o. male with PMH significant for anxiety, depression, arthritis, CAD, HTN, HLD, insomnia on Trazodone and Ambien, who presents with confusion and found down. His neurologic examination is notable for significant encephalopathy and agitation. No focal deficit. Suspect potential toxic metabolic etiology. Low overall suspicion for stroke. Recommend workup with CBC, chemistry, EtOH levels, UDS infectious screen to look for UTI, Pneumonia. Nutritional labs with B12, folate, B1, ammonia, TSH.   If metabolic workup is non revealing, would recommend getting MRI Brain without contrast along with a routine EEG.  Recommendations  -  CBC, chemistry, EtOH levels, UDS  - Infectious screen to look for UTI, Pneumonia. - Nutritional labs with B12, folate, B1, ammonia, TSH. - Thiamine '100mg'$  daily IV. - If metabolic workup is non revealing, would recommend getting MRI Brain without contrast along with a routine EEG. - judicious use of sedating medications. Recommend holding home Ambien and Trazodone. ______________________________________________________________________   Thank you for the opportunity to take part in the care of this patient. If you have any further questions, please contact the neurology consultation attending.  Signed,  [place] Pager Number [phone] _ _ _  _ __   _ __ _ _  __ __   _ __   __ _  

## 2022-05-16 NOTE — Progress Notes (Signed)
Precedex turned off d/t oversedation and bradycardia. Pt waked up to voice calmly and asked "Where am I?" Pt was reoriented. Pt conversing with bedside family. No signs of distress noted.

## 2022-05-16 NOTE — ED Notes (Signed)
Pt returned to room after code stroke CT. Pt is restless, unable to lie still for CT CERVICAL SPINE. Pt returned to room and this RN will plan for pt to complete scan when appropriate.

## 2022-05-16 NOTE — TOC Initial Note (Addendum)
Transition of Care [facility]) - Initial/Assessment Note    Patient Details  Name: Larry Elliott MRN: [id number] Date of Birth: 03/01/1946  [facility]) CM/SW Contact:    Tom-Johnson, Renea Ee, RN Phone Number: 05/16/2022, 3:32 PM  Clinical Narrative:                  CM spoke with patient's daughter and son at bedside about needs for post hospital transition. Patient is admitted for AMS. Was found unresponsive at home after a fall. On continuous EEG for Seizures. On Precedex. Daughter stated patient unintentionally took multiple sleeping pills and Poison control was called several days prior to admission.   From home with his wife and son. Has a cane,walker at home. Independent with care and drive self prior to admission.  PCP is Colon Branch, MD and uses [facility].  No Needs noted at this time. CM will continue to follow as patient progresses with care.     Barriers to Discharge: Continued Medical Work up   Patient Goals and CMS Choice Patient states their goals for this hospitalization and ongoing recovery are:: To return home CMS Medicare.gov Compare Post Acute Care list provided to:: Patient    Expected Discharge Plan and Services     Discharge Planning Services: CM Consult   Living arrangements for the past 2 months: Single Family Home                                      Prior Living Arrangements/Services Living arrangements for the past 2 months: Single Family Home Lives with:: Spouse, Adult Children (Son, Ronalee Belts.) Patient language and need for interpreter reviewed:: Yes Do you feel safe going back to the place where you live?: Yes      Need for Family Participation in Patient Care: Yes (Comment) Care giver support system in place?: Yes (comment) Current home services: DME (Cane, walker) Criminal Activity/Legal Involvement Pertinent to Current Situation/Hospitalization: No - Comment as needed  Activities of Daily  Living      Permission Sought/Granted Permission sought to share information with : Case Manager, Family Supports Permission granted to share information with : Yes, Verbal Permission Granted              Emotional Assessment Appearance:: Appears stated age Attitude/Demeanor/Rapport: Unable to Assess Affect (typically observed): Other (comment), Unable to Assess Orientation: : Oriented to Self Alcohol / Substance Use: Not Applicable Psych Involvement: No (comment)  Admission diagnosis:  Encephalopathy acute [G93.40] AMS (altered mental status) [R41.82] Patient Active Problem List   Diagnosis Date Noted   AMS (altered mental status) 05/16/2022   Encephalopathy acute    Radiculopathy 12/04/2019   Low back pain 08/03/2018   PCP NOTES >>>>> 09/06/2015   LLQ abdominal pain 07/16/2014   Anxiety and depression 11/04/2013   Pain in joint, ankle and foot 12/14/2012   Annual physical exam 03/11/2011   DJD (degenerative joint disease) 03/17/2010   MELANOMA, FACE 11/20/2007   History of malignant neoplasm of prostate 08/05/2007   Hyperlipidemia 02/21/2007   Essential hypertension 02/21/2007   DIVERTICULOSIS, COLON 02/21/2007   Insomnia 02/21/2007   PCP:  Colon Branch, MD Pharmacy:   [facility] # [address] [place] [address] [place] [address] Phone: [phone] Fax: [phone]     Social Determinants of Health (SDOH) Interventions  Readmission Risk Interventions     No data to display

## 2022-05-16 NOTE — Progress Notes (Signed)
Family brought patients hearing aids to the bedside for when he wakes up. Hearing aids placed in pt belonging bag with sticker on bag.

## 2022-05-16 NOTE — ED Notes (Signed)
Pt remains restless and is agitated at this time, unable to lie still on stretcher and rocking back in forth causing movement to bed while in locked position. Verbal order from attending to apply soft restraints to patient and applied to each extremity. Pt remains agitated, attempting to loses restraints and continually rocking stretcher by listlessly moving from side to side. Will continue to closely monitor pt for need of further intervention.

## 2022-05-16 NOTE — ED Notes (Signed)
Son and daughter at [facility] reasurring pt. Pt awake, restless, calmer, interactive with family, remains confused, follows commands some, impulsive. VSS. [place] removed and re-placed with CO2 monitoring [place], remains on 3 L. Offerred urinal and attempted to void, was unable, no output.

## 2022-05-16 NOTE — Procedures (Signed)
Patient Name: Larry Elliott  MRN: [id number]  Epilepsy Attending: Lora Havens  Referring Physician/Provider: Donnetta Simpers, MD  Date: 05/16/2022 Duration: 22.09 mins  Patient history: 76 year old male with altered mental status.  EEG to evaluate for seizure.  Level of alertness: Awake  AEDs during EEG study: Ativan  Technical aspects: This EEG study was done with scalp electrodes positioned according to the 10-20 International system of electrode placement. Electrical activity was acquired at a sampling rate of '500Hz'$  and reviewed with a high frequency filter of '70Hz'$  and a low frequency filter of '1Hz'$ . EEG data were recorded continuously and digitally stored.   Description: EEG showed continuous generalized 3 to 6 Hz theta-delta slowing admixed with an excessive amount of 15 to 18 Hz beta activity distributed symmetrically and diffusely.  Hyperventilation and photic stimulation were not performed.     ABNORMALITY - Continuous slow, generalized - Excessive beta, generalized  IMPRESSION: This study is suggestive of moderate diffuse encephalopathy, nonspecific etiology. The excessive beta activity seen in the background is most likely due to the effect of benzodiazepine and is a benign EEG pattern. No seizures or epileptiform discharges were seen throughout the recording.  Rosealie Reach Barbra Sarks

## 2022-05-16 NOTE — ED Notes (Signed)
Restraints released, daughter at [facility], bed linen, gown changed. Xray at [facility]. Pt restless, and cooperative.

## 2022-05-16 NOTE — Progress Notes (Signed)
EEG complete - results pending 

## 2022-05-16 NOTE — ED Notes (Signed)
Pt remains agitated, able to sit up on stretcher and remove restraint to left arm. Repositioned by staff and safety mit re-applied. In and out cath performed on pt by this RN and second RN and pt tolerated well. Urinalysis sent to lab.

## 2022-05-16 NOTE — Progress Notes (Signed)
eLink Physician-Brief Progress Note Patient Name: Larry Elliott DOB: 06-Aug-1946 MRN: [id number]   Date of Service  05/16/2022  HPI/Events of Note  Nursing reports that patient is restless and anxious. Patient sleeping on video assessment. I do not feel that the patient needs further intervention at this point.   eICU Interventions  Plan: Continue present management with Precedex IV infusion.      Intervention Category Major Interventions: Arrhythmia - evaluation and management  Mercedez Boule Eugene 05/16/2022, 11:09 PM

## 2022-05-16 NOTE — Progress Notes (Signed)
Neurology Progress Note   S:// Patient's daughter is st the bedside. RN at the bedside. Patient is on Precedex drip '@0'$ .31mg/kg/hr. Patient is in bilateral restraints. He is very restless and agitated. He is drowsy, eyes closed, will open eyes to voice and follow commands. He is oriented to person, age, month, unable to state place or situation. Able to recognize his daughter at bedside. Moves all extremities equally. Patients daughter states he has been struggling with severe insomnia, and he was taking multiple different sleep aids, melatonin, Ambien, Trazadone, benadryl and gummies?. On Thursday daughter was concerned for how many pills he took and called poison control, and continued to monitor patient and he seemed to be back to baseline. Earlier this am he had a fall and was found down by son, (heard him fall) and found him minimally responsive and a code stroke was called in the field.    O:// Current vital signs: BP 140/73   Pulse 89   Temp 98.5 F (36.9 C) (Axillary)   Resp (!) 23   Wt 106.2 kg   SpO2 95%   BMI 33.59 kg/m  Vital signs in last 24 hours: Temp:  [98.5 F (36.9 C)] 98.5 F (36.9 C) (07/24 0515) Pulse Rate:  [81-118] 89 (07/24 0845) Resp:  [21-43] 23 (07/24 0845) BP: (105-183)/(34-104) 140/73 (07/24 0845) SpO2:  [86 %-100 %] 95 % (07/24 0845) Weight:  [106.2 kg] 106.2 kg (07/24 0100)  GENERAL: drowsy, restless and agitated on Preceded drip HEENT: - Normocephalic and atraumatic, dry mm LUNGS - Clear to auscultation bilaterally with no wheezes CV - S1S2 RRR, no m/r/g, equal pulses bilaterally. ABDOMEN - Soft, nontender, nondistended with normoactive BS Ext: warm, well perfused, intact peripheral pulses, no edema  NEURO:  Mental Status: He is drowsy, on Precedex drip, awakens to voice, partially opens eyes. Very agitated and restless when disturbed. AA&Ox self, age, birth date and month. Not able to state place or situation. Able to identify daughter at the  bedside. Follows commands Language: speech is clear.  Cranial Nerves: PERRL 257mbrisk. EOMI, visual fields full, no facial asymmetry, facial sensation intact, hearing intact, tongue/uvula/soft palate midline, normal sternocleidomastoid and trapezius muscle strength. No evidence of tongue atrophy or fibrillations Motor: MAE equally and symmetric Tone: is normal and bulk is normal Sensation- responds to noxious stimuli Coordination: unable to assess due to AMS Gait- deferred   Medications  Current Facility-Administered Medications:    Chlorhexidine Gluconate Cloth 2 % PADS 6 each, 6 each, Topical, Daily, Harris, Whitney D, NP   dexmedetomidine (PRECEDEX) 400 MCG/100ML (4 mcg/mL) infusion, 0.4-1.2 mcg/kg/hr, Intravenous, Titrated, Pollina, ChGwenyth AllegraMD, Last Rate: 21.2 mL/hr at 05/16/22 0830, 0.8 mcg/kg/hr at 05/16/22 0830   docusate sodium (COLACE) capsule 100 mg, 100 mg, Oral, BID PRN, Harris, Whitney D, NP   heparin injection 5,000 Units, 5,000 Units, Subcutaneous, Q8H, Harris, Whitney D, NP   polyethylene glycol (MIRALAX / GLYCOLAX) packet 17 g, 17 g, Oral, Daily PRN, Harris, Whitney D, NP   thiamine (B-1) injection 100 mg, 100 mg, Intravenous, Daily, KhDonnetta SimpersMD Labs CBC    Component Value Date/Time   WBC 9.0 05/16/2022 0158   RBC 4.71 05/16/2022 0158   HGB 14.3 05/16/2022 0205   HCT 42.0 05/16/2022 0205   PLT 342 05/16/2022 0158   MCV 90.2 05/16/2022 0158   MCH 30.6 05/16/2022 0158   MCHC 33.9 05/16/2022 0158   RDW 13.8 05/16/2022 0158   LYMPHSABS 1.4 05/16/2022 0158   MONOABS 1.0 05/16/2022 0158  EOSABS 0.2 05/16/2022 0158   BASOSABS 0.0 05/16/2022 0158    CMP     Component Value Date/Time   NA 143 05/16/2022 0205   K 3.6 05/16/2022 0205   CL 108 05/16/2022 0205   CO2 24 05/16/2022 0158   GLUCOSE 107 (H) 05/16/2022 0205   GLUCOSE 92 09/05/2006 0835   BUN 13 05/16/2022 0205   CREATININE 0.60 (L) 05/16/2022 0205   CALCIUM 8.9 05/16/2022 0158   PROT  6.2 (L) 05/16/2022 0158   ALBUMIN 3.1 (L) 05/16/2022 0158   AST 44 (H) 05/16/2022 0158   ALT 91 (H) 05/16/2022 0158   ALKPHOS 84 05/16/2022 0158   BILITOT 0.5 05/16/2022 0158   GFRNONAA >60 05/16/2022 0158   GFRAA >60 11/27/2019 1142    glycosylated hemoglobin  Lipid Panel     Component Value Date/Time   CHOL 204 (H) 01/25/2022 1524   TRIG 584.0 (H) 01/25/2022 1524   TRIG 276 (HH) 09/05/2006 0835   HDL 47.70 01/25/2022 1524   CHOLHDL 4 01/25/2022 1524   VLDL 70.8 (H) 03/18/2021 1431   LDLCALC 56 02/19/2013 1624   LDLDIRECT 82.0 01/25/2022 1524     Imaging I have reviewed images in epic and the results pertinent to this consultation are:  CT-scan of the brain-1. Severely motion degraded examination. No acute intracranial abnormality. 2. ASPECTS is 10.  LABS:  TSH 1.13 Ammonia 31 Vit B 12 399 Folate 32 Vit B1 pending  UDS positive for Benzos UA negative  Acetaminophen <17 Salicylate <7 Ethanol <35 AST 44 ALT91 WBC 9 CXR Lower lung volumes with asymmetric left basilar atelectasis or early infiltrate. Cannot exclude aspiration Afebrile   Assessment:  76 year old male with coronary artery disease, hypertension, HLD, anxiety and depression prostate cancer and severe insomnia Taking multiple sleep aids ( Trazodone, Ambien, Benadryl, melatonin and Gummies?) who presented after he was found down unresponsive at his home following a fall.  On Thursday daughter was concerned for how many pills he took and called poison control, and continued to monitor patient and he seemed to be back to baseline. Code stroke was called in the field.   Impression: Acute toxic Encephalopathy in the setting of polypharmacy vs medication withdrawal  Recommendations: -Continue Precedex gtt - Limit use of sedating medications  - Seizure precautions - Monitor for withdrawal symptoms - if patient continues to be encephalopathic with no improvement in mental status, consider obtaining MRI  brain w/o and spot EEG  - Hydrate patient  - Neurology will continue to follow   Beulah Gandy DNP, ACNPC-AG

## 2022-05-16 NOTE — ED Notes (Signed)
PCCM into see at [facility]

## 2022-05-16 NOTE — ED Notes (Signed)
GCS 14, remains intermittently confused, impulsive, restless. Repositioned for comfort d/t chronic back pain

## 2022-05-16 NOTE — ED Notes (Signed)
Daughter and wife into room, at [facility].

## 2022-05-16 NOTE — ED Provider Notes (Addendum)
[facility] EMERGENCY DEPARTMENT Provider Note   CSN: [id number] Arrival date & time: 05/16/22  0147  An emergency department physician performed an initial assessment on this suspected stroke patient at [place].  History  Chief Complaint  Patient presents with   Code Stroke    Larry KOPPEN is a 76 y.o. male.  Patient presents to the emergency department for evaluation of altered mental status.  Patient comes as a code stroke.  Patient's son found him lying on the floor this morning.  Patient was confused and agitated during transport.       Home Medications Prior to Admission medications   Medication Sig Start Date End Date Taking? Authorizing Provider  atorvastatin (LIPITOR) 40 MG tablet TAKE ONE TABLET BY MOUTH DAILY AT BEDTIME 04/25/22   Colon Branch, MD  Calcium Polycarbophil (FIBER LAXATIVE PO) Take 1 tablet by mouth daily.    [provider]  enalapril (VASOTEC) 5 MG tablet TAKE ONE TABLET BY MOUTH ONE TIME DAILY 10/13/21   Colon Branch, MD  fenofibrate micronized (LOFIBRA) 134 MG capsule Take 1 capsule (134 mg total) by mouth daily before breakfast. 01/27/22   Colon Branch, MD  fluticasone [facility]) 50 MCG/ACT nasal spray Instill 2 sprays into both nostrils daily. 12/29/21 12/29/22  Terrilyn Saver, NP  hydrochlorothiazide (HYDRODIURIL) 25 MG tablet TAKE ONE TABLET BY MOUTH ONE TIME DAILY 09/20/21   Colon Branch, MD  Melatonin 10 MG TABS Take 10 mg by mouth at bedtime.    [provider]  multivitamin [facility]) per tablet Take 1 tablet by mouth daily.    [provider]  traZODone (DESYREL) 100 MG tablet TAKE ONE TABLET BY MOUTH DAILY AT BEDTIME 04/18/22   Colon Branch, MD  vitamin E 100 UNIT capsule Take 100 Units by mouth daily.    [provider]  zolpidem (AMBIEN) 10 MG tablet TAKE HALF TO ONE TABLET BY MOUTH AT BEDTIME AS NEEDED FOR SLEEP 01/25/22   Colon Branch, MD      Allergies    Aspirin, Hydrocodone, and Penicillins     Review of Systems   Review of Systems  Physical Exam Updated Vital Signs BP (!) 147/92   Pulse (!) 106   Temp 98.5 F (36.9 C) (Axillary)   Resp (!) 29   Wt 106.2 kg   SpO2 97%   BMI 33.59 kg/m  Physical Exam Vitals and nursing note reviewed.  Constitutional:      General: He is not in acute distress.    Appearance: He is well-developed.  HENT:     Head: Normocephalic and atraumatic.     Mouth/Throat:     Mouth: Mucous membranes are moist.  Eyes:     General: Vision grossly intact. Gaze aligned appropriately.     Extraocular Movements: Extraocular movements intact.     Conjunctiva/sclera: Conjunctivae normal.  Cardiovascular:     Rate and Rhythm: Normal rate and regular rhythm.     Pulses: Normal pulses.     Heart sounds: Normal heart sounds, S1 normal and S2 normal. No murmur heard.    No friction rub. No gallop.  Pulmonary:     Effort: Pulmonary effort is normal. No respiratory distress.     Breath sounds: Normal breath sounds.  Abdominal:     Palpations: Abdomen is soft.     Tenderness: There is no abdominal tenderness. There is no guarding or rebound.     Hernia: No hernia is present.  Musculoskeletal:        General: No swelling.     Cervical back: Full passive range of motion without pain, normal range of motion and neck supple. No pain with movement, spinous process tenderness or muscular tenderness. Normal range of motion.     Right lower leg: No edema.     Left lower leg: No edema.  Skin:    General: Skin is warm and dry.     Capillary Refill: Capillary refill takes less than 2 seconds.     Findings: No ecchymosis, erythema, lesion or wound.  Neurological:     Cranial Nerves: Cranial nerves 2-12 are intact.     Motor: Motor function is intact. No weakness or abnormal muscle tone.     Comments: Patient agitated, disorganized.  He continually tries to climb out of the bed, yells intermittently.  Psychiatric:        Behavior: Behavior is agitated.      ED Results / Procedures / Treatments   Labs (all labs ordered are listed, but only abnormal results are displayed) Labs Reviewed  COMPREHENSIVE METABOLIC PANEL - Abnormal; Notable for the following components:      Result Value   Glucose, Bld 109 (*)    Total Protein 6.2 (*)    Albumin 3.1 (*)    AST 44 (*)    ALT 91 (*)    All other components within normal limits  URINALYSIS, ROUTINE W REFLEX MICROSCOPIC - Abnormal; Notable for the following components:   APPearance HAZY (*)    All other components within normal limits  I-STAT CHEM 8, ED - Abnormal; Notable for the following components:   Creatinine, Ser 0.60 (*)    Glucose, Bld 107 (*)    Calcium, Ion 1.12 (*)    All other components within normal limits  CBG MONITORING, ED - Abnormal; Notable for the following components:   Glucose-Capillary 116 (*)    All other components within normal limits  PROTIME-INR  APTT  CBC  DIFFERENTIAL  ETHANOL  RAPID URINE DRUG SCREEN, HOSP PERFORMED  AMMONIA  TSH  VITAMIN B12  FOLATE  VITAMIN B1    EKG None  Radiology CT HEAD CODE STROKE WO CONTRAST  Result Date: 05/16/2022 CLINICAL DATA:  Code stroke. EXAM: CT HEAD WITHOUT CONTRAST TECHNIQUE: Contiguous axial images were obtained from the base of the skull through the vertex without intravenous contrast. RADIATION DOSE REDUCTION: This exam was performed according to the departmental dose-optimization program which includes automated exposure control, adjustment of the mA and/or kV according to patient size and/or use of iterative reconstruction technique. COMPARISON:  None Available. FINDINGS: Severely motion degraded examination. Brain: There is no mass, hemorrhage or extra-axial collection. The size and configuration of the ventricles and extra-axial CSF spaces are normal. There is hypoattenuation of the periventricular white matter, most commonly indicating chronic ischemic microangiopathy. Vascular: No abnormal hyperdensity of  the major intracranial arteries or dural venous sinuses. No intracranial atherosclerosis. Skull: The visualized skull base, calvarium and extracranial soft tissues are normal. Sinuses/Orbits: No fluid levels or advanced mucosal thickening of the visualized paranasal sinuses. No mastoid or middle ear effusion. The orbits are normal. ASPECTS [facility] Stroke Program Early CT Score) - Ganglionic level infarction (caudate, lentiform nuclei, internal capsule, insula, M1-M3 cortex): 7 - Supraganglionic infarction (M4-M6 cortex): 3 Total score (0-10 with 10 being normal): 10 IMPRESSION: 1. Severely motion degraded examination. No acute intracranial abnormality. 2. ASPECTS is 10. These results were communicated to Dr. Donnetta Simpers at 2:17  am on 05/16/2022 by text page via the [facility] messaging system. Electronically Signed   By: Ulyses Jarred M.D.   On: 05/16/2022 02:19    Procedures Procedures    Medications Ordered in ED Medications  sodium chloride flush (NS) 0.9 % injection 3 mL (3 mLs Intravenous Given 05/16/22 0333)  haloperidol lactate (HALDOL) injection 5 mg (5 mg Intravenous Given 05/16/22 0248)  LORazepam (ATIVAN) injection 1 mg (1 mg Intravenous Given 05/16/22 0333)  haloperidol lactate (HALDOL) injection 5 mg (5 mg Intravenous Given 05/16/22 0425)  LORazepam (ATIVAN) injection 1 mg (1 mg Intravenous Given 05/16/22 0425)  fentaNYL (SUBLIMAZE) injection 100 mcg (100 mcg Intravenous Given 05/16/22 0510)    ED Course/ Medical Decision Making/ A&P                           Medical Decision Making Amount and/or Complexity of Data Reviewed Labs: ordered. Radiology: ordered.  Risk Prescription drug management.   Patient brought to the emergency department for altered mental status.  EMS initiated a code stroke.  Patient was found on the floor by his family, circumstances unclear.  He was initially difficult to arouse but then became more agitated but would not cooperate with exam.  His screening  CT performed at arrival did not show acute pathology.  Dr. Lorrin Goodell reports low suspicion for stroke.  Suspect metabolic encephalopathy.  Patient difficult to redirect.  He became progressively more agitated.  He was therefore given Haldol and Ativan with little effect.  He did start to complain about back pain, was given fentanyl for the pain to see if this would ease his agitation.  He is still very restless and disoriented.  Metabolic work-up has been unremarkable.  Urinalysis without signs of infection.  Remainder of labs normal.  Patient reportedly ran out of his Ambien.  Reviewing his records reveals he has a history of chronic insomnia.  There is some speculation that he may have taken extra trazodone to try to sleep.  Drug screen, however, only shows the benzodiazepines that we administered.  Discussed with Dr. Marlowe Sax, on-call for hospitalist.  Due to patient's persistent agitation, she requests critical care evaluation for possible administration of Precedex.   CRITICAL CARE Performed by: Orpah Greek   Total critical care time: 32 minutes Critical care time was exclusive of separately billable procedu  Startres and treating other patients.  Critical care was necessary to treat or prevent imminent or life-threatening deterioration.  Critical care was time spent personally by me on the following activities: development of treatment plan with patient and/or surrogate as well as nursing, discussions with consultants, evaluation of patient's response to treatment, examination of patient, obtaining history from patient or surrogate, ordering and performing treatments and interventions, ordering and review of laboratory studies, ordering and review of radiographic studies, pulse oximetry and re-evaluation of patient's condition.        Final Clinical Impression(s) / ED Diagnoses Final diagnoses:  Encephalopathy acute    Rx / DC Orders ED Discharge Orders     None          Dagmawi Venable, Gwenyth Allegra, MD 05/16/22 6808    Orpah Greek, MD 05/16/22 [phone]

## 2022-05-16 NOTE — ED Notes (Signed)
Transporting with RN to [phone]

## 2022-05-16 NOTE — ED Notes (Signed)
This RN attempted x2 to call pt's son, Ronalee Belts with no answer. No voicemail capability at this time.

## 2022-05-16 NOTE — Progress Notes (Signed)
Sedated - on precedex

## 2022-05-16 NOTE — ED Notes (Signed)
Pt able to free arm from restraints and remove C-Collar and monitoring equipment repeatedly. Pt  reconnected to monitor. C-Collar not re-applied d/t poor tolerance and MD aware of the same.

## 2022-05-16 NOTE — Code Documentation (Signed)
Responded to Code Stroke called at 0135 for AMS, [id number]. Pt arrived at [facility], CBG-116, NIH-1, CT head-negative for acute changes. TNK not given-low suspicion for stroke. Plan for metabolic workup.

## 2022-05-16 NOTE — ED Triage Notes (Signed)
Pt brought to ED by [facility] with c/o of possible code stroke. EMS states pt's son found him lying on the floor this morning. Pt noted to have dysphagia, rapid respirations and unable to follow commands with initial EMS screening. LKW was yesterday at 05/15/22 2230.

## 2022-05-16 NOTE — Progress Notes (Signed)
Sedated on precedex

## 2022-05-16 NOTE — H&P (Signed)
NAME:  Larry Elliott, MRN:  [id number], DOB:  1946/05/03, LOS: 0 ADMISSION DATE:  05/16/2022, CONSULTATION DATE:  05/16/2022 REFERRING MD:  Dr. Betsey Holiday, CHIEF COMPLAINT:  AMS   History of Present Illness:  Larry Elliott is a 76 year old male history significant for CAD, hypertension, hyperlipidemia, prostate cancer, anxiety, depression, and insomnia taking (Trazodone and Ambien) who presented to the ED originally as a code stroke after family found patient minimally responsive post fall.  Per daughter patient has had several days of struggling with insomnia and 2 days prior to arrival unintentionally took multiple sleep aids concerning for overdose and Poison control was called.  Family monitor patient at home and returned to baseline the following morning.    On arrival to ED patient was seen agitated with difficulty to redirect and unable to follow commands.  He is only oriented to self.  Head CT without contrast motion degraded but negative for large stroke or ICH.  During ED stay patient has progressively come more more agitated requiring initiation of Precedex drip.  Lab work significant for albumin 3.1, AST 44, ALT 91, WBC within normal limit.  Toxicology positive for benzodiazepines. Given need for Prexedex drip PCCM consulted for further management and admission  Pertinent  Medical History  CAD, hypertension, hyperlipidemia, prostate cancer, anxiety, depression, and insomnia taking (Trazodone and Ambien)  Significant Hospital Events: Including procedures, antibiotic start and stop dates in addition to other pertinent events   7/24 initially admitted as code stroke as patient was found minimally responsive after fall at home, being admitted for AMS requiring Precedex drip  Interim History / Subjective:  As above  Objective   Blood pressure (!) 162/92, pulse (!) 113, temperature 98.5 F (36.9 C), temperature source Axillary, resp. rate (!) 42, weight 106.2 kg, SpO2 95 %.       No  intake or output data in the 24 hours ending 05/16/22 0721 Filed Weights   05/16/22 0100  Weight: 106.2 kg    Examination: General: Acute on chronic ill-appearing elderly gentleman lying in bed in four-point restraints minimally agitated on Precedex drip HEENT: [place]/AT, MM pink/moist, PERRL,  Neuro: Alert to self, is able to identify daughter by name, seen moving all extremities continuously, slightly sleepy on Precedex drip CV: s1s2 regular rate and rhythm, no murmur, rubs, or gallops,  PULM: Clear to auscultation bilaterally, no increased work of breathing, no added breath sounds, on 5 L nasal cannula GI: soft, bowel sounds active in all 4 quadrants, non-tender, non-distended Extremities: warm/dry, n edema  Skin: no rashes or lesions  Resolved Hospital Problem list     Assessment & Plan:  Acute encephalopathy -Etiology unknown at this time.  Need to rule out metabolic and/or infectious causes but leading differential currently is medication withdrawal -Toxicology positive for benzodiazepines -UA on arrival unremarkable. B12, folate, B1, ammonia, TSH all within normal limits P: Evaluated patient on arrival as code stroke suspected Consider MRI brain once agitation improves Can consider spot EEG as well No acute concerns for meningitis, trend CBC and fever curve Maintain neuro protective measures Nutrition and bowel regiment  Seizure precautions  Aspirations precautions  Check Tylenol and salicylate level Check chest x-ray  Hold home medication, monitor for further withdrawal Continue Precedex  History of CAD, hypertension, hyperlipidemia -Home medication includes Lipitor, Vasotec, hydrochlorothiazide, P: IV as needed antihypertensives until mentation improves and can safely take oral medication Continuous telemetry Obtain echocardiogram Check high-sensitivity troponin  History of anxiety, depression, and insomnia  -Home medications  include melatonin, trazodone, and  Ambien P: Home medications as above  Best Practice (right click and "Reselect all SmartList Selections" daily)   Diet/type: NPO until mentation improves DVT prophylaxis: prophylactic heparin  GI prophylaxis: PPI Lines: N/A Foley:  N/A Code Status:  full code Last date of multidisciplinary goals of care discussion: Discussed with family at bedside on admission, continue aggressive interventions currently  Labs   CBC: Recent Labs  Lab 05/16/22 0158 05/16/22 0205  WBC 9.0  --   NEUTROABS 6.3  --   HGB 14.4 14.3  HCT 42.5 42.0  MCV 90.2  --   PLT 342  --     Basic Metabolic Panel: Recent Labs  Lab 05/16/22 0158 05/16/22 0205  NA 142 143  K 3.7 3.6  CL 111 108  CO2 24  --   GLUCOSE 109* 107*  BUN 13 13  CREATININE 0.76 0.60*  CALCIUM 8.9  --    GFR: Estimated Creatinine Clearance: 92.9 mL/min (A) (by C-G formula based on SCr of 0.6 mg/dL (L)). Recent Labs  Lab 05/16/22 0158  WBC 9.0    Liver Function Tests: Recent Labs  Lab 05/16/22 0158  AST 44*  ALT 91*  ALKPHOS 84  BILITOT 0.5  PROT 6.2*  ALBUMIN 3.1*   No results for input(s): "LIPASE", "AMYLASE" in the last 168 hours. Recent Labs  Lab 05/16/22 0546  AMMONIA 31    ABG    Component Value Date/Time   PHART 7.432 06/15/2010 0938   PCO2ART 38.1 06/15/2010 0938   PO2ART 64.0 (L) 06/15/2010 0938   HCO3 25.4 (H) 06/15/2010 0938   TCO2 23 05/16/2022 0205   O2SAT 93.0 06/15/2010 0938     Coagulation Profile: Recent Labs  Lab 05/16/22 0158  INR 0.9    Cardiac Enzymes: No results for input(s): "CKTOTAL", "CKMB", "CKMBINDEX", "TROPONINI" in the last 168 hours.  HbA1C: Hgb A1c MFr Bld  Date/Time Value Ref Range Status  01/25/2022 03:24 PM 6.1 4.6 - 6.5 % Final    Comment:    Glycemic Control Guidelines for People with Diabetes:Non Diabetic:  <6%Goal of Therapy: <7%Additional Action Suggested:  >8%   08/18/2021 11:18 AM 6.1 4.6 - 6.5 % Final    Comment:    Glycemic Control Guidelines  for People with Diabetes:Non Diabetic:  <6%Goal of Therapy: <7%Additional Action Suggested:  >8%     CBG: Recent Labs  Lab 05/16/22 0152  GLUCAP 116*    Review of Systems:   History obtained from family as patient is currently encephalopathic  Past Medical History:  He,  has a past medical history of Anxiety and depression (11/04/2013), Arthritis, BCC (basal cell carcinoma), leg, CAD (coronary artery disease), Diverticulosis, Erectile dysfunction following radical prostatectomy, Hyperlipidemia, Hypertension, Insomnia, Melanoma ([place]) (2010), Prostate CA ([place]) (10/08), and Spermatocele.   Surgical History:   Past Surgical History:  Procedure Laterality Date   APPENDECTOMY     HERNIA REPAIR     KNEE ARTHROSCOPY Left    meniscal tear   PROSTATECTOMY  07-2007    robotic,  for prostate cancer     SHOULDER ARTHROSCOPY  07-09-2013   Dr Tamera Punt    TRANSFORAMINAL LUMBAR INTERBODY FUSION (TLIF) WITH PEDICLE SCREW FIXATION 1 LEVEL Right 12/04/2019   Procedure: RIGHT-SIDED TRANSFORAMINAL LUMBAR INTERBODY FUSION LUMBAR FOUR THROUGH FIVE WITH INSTRUMENTATION AND ALLOGRAFT;  Surgeon: Phylliss Bob, MD;  Location: [facility];  Service: Orthopedics;  Laterality: Right;     Social History:   reports that he has never smoked.  He has never used smokeless tobacco. He reports current alcohol use of about 7.0 standard drinks of alcohol per week. He reports that he does not use drugs.   Family History:  His family history includes Coronary artery disease (age of onset: 1) in his father; Stroke in his mother. There is no history of Hypertension, Diabetes, Colon cancer, or Prostate cancer.   Allergies Allergies  Allergen Reactions   Aspirin Other (See Comments)    REACTION: swelling in face   Hydrocodone     Insomnia, mild SOB (w/o cough-wheezing)   Penicillins Other (See Comments)    REACTION: swelling in face  Did it involve swelling of the face/tongue/throat, SOB, or low BP? Yes Did it involve  sudden or severe rash/hives, skin peeling, or any reaction on the inside of your mouth or nose? No Did you need to seek medical attention at a hospital or doctor's office? Yes When did it last happen?      [id number] played Hockey If all above answers are "NO", may proceed with cephalosporin use.     Home Medications  Prior to Admission medications   Medication Sig Start Date End Date Taking? Authorizing Provider  atorvastatin (LIPITOR) 40 MG tablet TAKE ONE TABLET BY MOUTH DAILY AT BEDTIME 04/25/22   Colon Branch, MD  Calcium Polycarbophil (FIBER LAXATIVE PO) Take 1 tablet by mouth daily.    [provider]  enalapril (VASOTEC) 5 MG tablet TAKE ONE TABLET BY MOUTH ONE TIME DAILY 10/13/21   Colon Branch, MD  fenofibrate micronized (LOFIBRA) 134 MG capsule Take 1 capsule (134 mg total) by mouth daily before breakfast. 01/27/22   Colon Branch, MD  fluticasone [facility]) 50 MCG/ACT nasal spray Instill 2 sprays into both nostrils daily. 12/29/21 12/29/22  Terrilyn Saver, NP  hydrochlorothiazide (HYDRODIURIL) 25 MG tablet TAKE ONE TABLET BY MOUTH ONE TIME DAILY 09/20/21   Colon Branch, MD  Melatonin 10 MG TABS Take 10 mg by mouth at bedtime.    [provider]  multivitamin [facility]) per tablet Take 1 tablet by mouth daily.    [provider]  traZODone (DESYREL) 100 MG tablet TAKE ONE TABLET BY MOUTH DAILY AT BEDTIME 04/18/22   Colon Branch, MD  vitamin E 100 UNIT capsule Take 100 Units by mouth daily.    [provider]  zolpidem (AMBIEN) 10 MG tablet TAKE HALF TO ONE TABLET BY MOUTH AT BEDTIME AS NEEDED FOR SLEEP 01/25/22   Colon Branch, MD     Critical care time: 42 min  Ellowyn Rieves D. Kenton Kingfisher, NP-C [facility] Personal contact information can be found on Amion  05/16/2022, 8:30 AM

## 2022-05-17 ENCOUNTER — Inpatient Hospital Stay (HOSPITAL_COMMUNITY): Payer: Medicare Other

## 2022-05-17 DIAGNOSIS — R4182 Altered mental status, unspecified: Secondary | ICD-10-CM | POA: Diagnosis not present

## 2022-05-17 DIAGNOSIS — G9340 Encephalopathy, unspecified: Secondary | ICD-10-CM | POA: Diagnosis not present

## 2022-05-17 LAB — CBC
HCT: 39.7 % (ref 39.0–52.0)
Hemoglobin: 12.9 g/dL — ABNORMAL LOW (ref 13.0–17.0)
MCH: 30.1 pg (ref 26.0–34.0)
MCHC: 32.5 g/dL (ref 30.0–36.0)
MCV: 92.5 fL (ref 80.0–100.0)
Platelets: 266 10*3/uL (ref 150–400)
RBC: 4.29 MIL/uL (ref 4.22–5.81)
RDW: 13.9 % (ref 11.5–15.5)
WBC: 8.6 10*3/uL (ref 4.0–10.5)
nRBC: 0 % (ref 0.0–0.2)

## 2022-05-17 LAB — GLUCOSE, CAPILLARY
Glucose-Capillary: 122 mg/dL — ABNORMAL HIGH (ref 70–99)
Glucose-Capillary: 103 mg/dL — ABNORMAL HIGH (ref 70–99)
Glucose-Capillary: 123 mg/dL — ABNORMAL HIGH (ref 70–99)
Glucose-Capillary: 91 mg/dL (ref 70–99)
Glucose-Capillary: 97 mg/dL (ref 70–99)

## 2022-05-17 LAB — BASIC METABOLIC PANEL
Anion gap: 7 (ref 5–15)
BUN: 11 mg/dL (ref 8–23)
CO2: 22 mmol/L (ref 22–32)
Calcium: 8.3 mg/dL — ABNORMAL LOW (ref 8.9–10.3)
Chloride: 114 mmol/L — ABNORMAL HIGH (ref 98–111)
Creatinine, Ser: 0.66 mg/dL (ref 0.61–1.24)
GFR, Estimated: >60 mL/min (ref >60)
Glucose, Bld: 115 mg/dL — ABNORMAL HIGH (ref 70–99)
Potassium: 3.7 mmol/L (ref 3.5–5.1)
Sodium: 143 mmol/L (ref 135–145)

## 2022-05-17 LAB — PHOSPHORUS: Phosphorus: 2.6 mg/dL (ref 2.5–4.6)

## 2022-05-17 LAB — MAGNESIUM: Magnesium: 2.1 mg/dL (ref 1.7–2.4)

## 2022-05-17 MED ORDER — ATORVASTATIN CALCIUM 40 MG PO TABS
40.0000 mg | ORAL_TABLET | Freq: Every day | ORAL | Status: DC
  Administered 2022-05-17: 40 mg via ORAL
  Filled 2022-05-17: qty 1

## 2022-05-17 MED ORDER — THIAMINE HCL 100 MG PO TABS
100.0000 mg | ORAL_TABLET | Freq: Every day | ORAL | Status: DC
  Administered 2022-05-18: 100 mg via ORAL
  Filled 2022-05-17: qty 1

## 2022-05-17 MED ORDER — ENALAPRIL MALEATE 2.5 MG PO TABS
5.0000 mg | ORAL_TABLET | Freq: Every day | ORAL | Status: DC
  Administered 2022-05-17 – 2022-05-18 (×2): 5 mg via ORAL
  Filled 2022-05-17: qty 1
  Filled 2022-05-17: qty 2

## 2022-05-17 MED ORDER — HYDROCHLOROTHIAZIDE 25 MG PO TABS
25.0000 mg | ORAL_TABLET | Freq: Every day | ORAL | Status: DC
  Administered 2022-05-17 – 2022-05-18 (×2): 25 mg via ORAL
  Filled 2022-05-17 (×2): qty 1

## 2022-05-17 MED ORDER — CALCIUM POLYCARBOPHIL 625 MG PO TABS
625.0000 mg | ORAL_TABLET | Freq: Every day | ORAL | Status: DC
  Administered 2022-05-17: 625 mg via ORAL
  Filled 2022-05-17 (×3): qty 1

## 2022-05-17 MED ORDER — LORAZEPAM 2 MG/ML IJ SOLN
0.5000 mg | Freq: Once | INTRAMUSCULAR | Status: AC
  Administered 2022-05-17: 0.5 mg via INTRAVENOUS
  Filled 2022-05-17: qty 1

## 2022-05-17 MED ORDER — CLORAZEPATE DIPOTASSIUM 3.75 MG PO TABS
7.5000 mg | ORAL_TABLET | Freq: Every evening | ORAL | Status: DC | PRN
  Administered 2022-05-17: 7.5 mg via ORAL
  Filled 2022-05-17: qty 2

## 2022-05-17 NOTE — Progress Notes (Signed)
Subjective: Son reports that he is back to baseline.  He has had a lot of trouble sleeping, and has been taking a large amount of both over-the-counter and prescription sleep aids, including Ambien, Tranxene, trazodone and a [employer] brand (suspect doxylamine), and melatonin  Exam: Vitals:   05/17/22 1100 05/17/22 1108  BP: 139/79   Pulse: 70   Resp: 19   Temp:  (!) 96.3 F (35.7 C)  SpO2: 95%    Gen: In bed, NAD Resp: non-labored breathing, no acute distress Abd: soft, nt  Neuro: MS: Awake, alert, oriented and appropriate CN: Visual fields full, face symmetric Motor: Moves all extremities well Sensory: Intact to light touch   Pertinent Labs: Creatinine 0.66 B12 399 TSH and ammonia are normal UDS positive for benzos(takes Tranxene)   Impression: 76 year old male with recurrent episodes of nighttime confusion in the setting of polysomnopharmacy.  I wonder if he has some degree of sleep apnea causing daytime sleepiness which causes him to get his circadian rhythm offkilter.  I think he would benefit from a sleep evaluation, and in the meantime would limit sleep aids as I suspect that is what is responsible for his presentation.  Recommendations: 1) could consider Seroquel '25mg'$  as a one-time dose around 8 PM, but I would not use this as an ongoing sleep aid 2) outpatient sleep evaluation 3) okay with trazodone, melatonin for sleep aid, but I would limit other sedating medications, particularly Ambien anticholinergics like doxylamine or diphenhydramine 4) neurology will be available on an as-needed basis.  Roland Rack, MD [facility] [phone]  If 7pm- 7am, please page neurology on call as listed in [place].

## 2022-05-17 NOTE — Progress Notes (Signed)
NAME:  Larry Elliott, MRN:  [id number], DOB:  1946-02-27, LOS: 1 ADMISSION DATE:  05/16/2022, CONSULTATION DATE:  05/16/2022 REFERRING MD:  Dr. Betsey Holiday, CHIEF COMPLAINT:  AMS   History of Present Illness:  Larry Elliott is a 76 year old male history significant for CAD, hypertension, hyperlipidemia, prostate cancer, anxiety, depression, and insomnia taking (Trazodone and Ambien) who presented to the ED originally as a code stroke after family found patient minimally responsive post fall.  Per daughter patient has had several days of struggling with insomnia and 2 days prior to arrival unintentionally took multiple sleep aids concerning for overdose and Poison control was called.  Family monitor patient at home and returned to baseline the following morning.    On arrival to ED patient was seen agitated with difficulty to redirect and unable to follow commands.  He is only oriented to self.  Head CT without contrast motion degraded but negative for large stroke or ICH.  During ED stay patient has progressively come more more agitated requiring initiation of Precedex drip.  Lab work significant for albumin 3.1, AST 44, ALT 91, WBC within normal limit.  Toxicology positive for benzodiazepines. Given need for Prexedex drip PCCM consulted for further management and admission  Pertinent  Medical History  CAD, hypertension, hyperlipidemia, prostate cancer, anxiety, depression, and insomnia taking (Trazodone and Ambien)  Significant Hospital Events: Including procedures, antibiotic start and stop dates in addition to other pertinent events   7/24 initially admitted as code stroke as patient was found minimally responsive after fall at home, being admitted for AMS requiring Precedex drip  Interim History / Subjective:  Remains on precedex 1 Patient Aox3 Hard of hearing; wears hearing aids at home  Objective   Blood pressure 107/64, pulse (!) 48, temperature 98.5 F (36.9 C), temperature source  Axillary, resp. rate (!) 23, weight 106.8 kg, SpO2 95 %.        Intake/Output Summary (Last 24 hours) at 05/17/2022 0720 Last data filed at 05/17/2022 0600 Gross per 24 hour  Intake 1061.86 ml  Output 950 ml  Net 111.86 ml   Filed Weights   05/16/22 0100 05/17/22 0500  Weight: 106.2 kg 106.8 kg    Examination: General:   NAD on precedex HEENT: MM pink/dry; Fisher in place; hard of hearing Neuro: Aox3; MAE CV: s1s2, no m/r/g PULM:  dim clear BS bilaterally; Ola 4L GI: soft, bsx4 active  Extremities: warm/dry, no edema  Skin: no rashes or lesions appreciated   Resolved Hospital Problem list     Assessment & Plan:  Acute toxic encephalopathy -Etiology unknown at this time.  Need to rule out metabolic and/or infectious causes but leading differential currently is medication withdrawal -Toxicology positive for benzodiazepines -UA on arrival unremarkable. B12, folate, B1, ammonia, TSH, salicylate, acetaminophen all within normal limits -EEG without seizure P: -encephalopathy improving -work on weaning precedex off today -consider starting seroquel if having difficulty weaning off precedex -delirium precautions in place -limit sedating meds  History of CAD, hypertension, hyperlipidemia -Home medication includes Lipitor, Vasotec, hydrochlorothiazide, P: -swallow eval this am; if passes will resume home lipitor, hctz, vasotec  History of anxiety, depression, and insomnia  -Home medications include melatonin, trazodone, and Ambien P: -hold home meds given encephalopathy   Best Practice (right click and "Reselect all SmartList Selections" daily)   Diet/type: clear liquids advance as tolerated DVT prophylaxis: prophylactic heparin  GI prophylaxis: PPI Lines: N/A Foley:  N/A Code Status:  full code Last date of multidisciplinary goals of  care discussion: talked with patient and son at bedside  Labs   CBC: Recent Labs  Lab 05/16/22 0158 05/16/22 0205 05/16/22 1611  05/17/22 0552  WBC 9.0  --   --  8.6  NEUTROABS 6.3  --   --   --   HGB 14.4 14.3 12.2* 12.9*  HCT 42.5 42.0 36.0* 39.7  MCV 90.2  --   --  92.5  PLT 342  --   --  266     Basic Metabolic Panel: Recent Labs  Lab 05/16/22 0158 05/16/22 0205 05/16/22 1611 05/17/22 0552  NA 142 143 143 143  K 3.7 3.6 3.9 3.7  CL 111 108  --  114*  CO2 24  --   --  22  GLUCOSE 109* 107*  --  115*  BUN 13 13  --  11  CREATININE 0.76 0.60*  --  0.66  CALCIUM 8.9  --   --  8.3*  MG  --   --   --  2.1  PHOS  --   --   --  2.6    GFR: Estimated Creatinine Clearance: 93.1 mL/min (by C-G formula based on SCr of 0.66 mg/dL). Recent Labs  Lab 05/16/22 0158 05/17/22 0552  WBC 9.0 8.6     Liver Function Tests: Recent Labs  Lab 05/16/22 0158  AST 44*  ALT 91*  ALKPHOS 84  BILITOT 0.5  PROT 6.2*  ALBUMIN 3.1*    No results for input(s): "LIPASE", "AMYLASE" in the last 168 hours. Recent Labs  Lab 05/16/22 0546  AMMONIA 31     ABG    Component Value Date/Time   PHART 7.375 05/16/2022 1611   PCO2ART 42.7 05/16/2022 1611   PO2ART 116 (H) 05/16/2022 1611   HCO3 25.0 05/16/2022 1611   TCO2 26 05/16/2022 1611   O2SAT 98 05/16/2022 1611     Coagulation Profile: Recent Labs  Lab 05/16/22 0158  INR 0.9     Cardiac Enzymes: No results for input(s): "CKTOTAL", "CKMB", "CKMBINDEX", "TROPONINI" in the last 168 hours.  HbA1C: Hgb A1c MFr Bld  Date/Time Value Ref Range Status  01/25/2022 03:24 PM 6.1 4.6 - 6.5 % Final    Comment:    Glycemic Control Guidelines for People with Diabetes:Non Diabetic:  <6%Goal of Therapy: <7%Additional Action Suggested:  >8%   08/18/2021 11:18 AM 6.1 4.6 - 6.5 % Final    Comment:    Glycemic Control Guidelines for People with Diabetes:Non Diabetic:  <6%Goal of Therapy: <7%Additional Action Suggested:  >8%     CBG: Recent Labs  Lab 05/16/22 1214 05/16/22 1547 05/16/22 1947 05/16/22 2330 05/17/22 0338  GLUCAP 144* 136* 96 128* 122*      Review of Systems:   History obtained from family as patient is currently encephalopathic  Past Medical History:  He,  has a past medical history of Anxiety and depression (11/04/2013), Arthritis, BCC (basal cell carcinoma), leg, CAD (coronary artery disease), Diverticulosis, Erectile dysfunction following radical prostatectomy, Hyperlipidemia, Hypertension, Insomnia, Melanoma ([place]) (2010), Prostate CA ([place]) (10/08), and Spermatocele.   Surgical History:   Past Surgical History:  Procedure Laterality Date   APPENDECTOMY     HERNIA REPAIR     KNEE ARTHROSCOPY Left    meniscal tear   PROSTATECTOMY  07-2007    robotic,  for prostate cancer     SHOULDER ARTHROSCOPY  07-09-2013   Dr Tamera Punt    TRANSFORAMINAL LUMBAR INTERBODY FUSION (TLIF) WITH PEDICLE SCREW FIXATION 1 LEVEL Right 12/04/2019  Procedure: RIGHT-SIDED TRANSFORAMINAL LUMBAR INTERBODY FUSION LUMBAR FOUR THROUGH FIVE WITH INSTRUMENTATION AND ALLOGRAFT;  Surgeon: Phylliss Bob, MD;  Location: [place];  Service: Orthopedics;  Laterality: Right;     Social History:   reports that he has never smoked. He has never used smokeless tobacco. He reports current alcohol use of about 7.0 standard drinks of alcohol per week. He reports that he does not use drugs.   Family History:  His family history includes Coronary artery disease (age of onset: 68) in his father; Stroke in his mother. There is no history of Hypertension, Diabetes, Colon cancer, or Prostate cancer.   Allergies Allergies  Allergen Reactions   Aspirin Other (See Comments)    REACTION: swelling in face   Hydrocodone     Insomnia, mild SOB (w/o cough-wheezing)   Penicillins Other (See Comments)    REACTION: swelling in face  Did it involve swelling of the face/tongue/throat, SOB, or low BP? Yes Did it involve sudden or severe rash/hives, skin peeling, or any reaction on the inside of your mouth or nose? No Did you need to seek medical attention at a hospital or  doctor's office? Yes When did it last happen?      [id number] played Hockey If all above answers are "NO", may proceed with cephalosporin use.     Home Medications  Prior to Admission medications   Medication Sig Start Date End Date Taking? Authorizing Provider  atorvastatin (LIPITOR) 40 MG tablet TAKE ONE TABLET BY MOUTH DAILY AT BEDTIME 04/25/22   Colon Branch, MD  Calcium Polycarbophil (FIBER LAXATIVE PO) Take 1 tablet by mouth daily.    [provider]  enalapril (VASOTEC) 5 MG tablet TAKE ONE TABLET BY MOUTH ONE TIME DAILY 10/13/21   Colon Branch, MD  fenofibrate micronized (LOFIBRA) 134 MG capsule Take 1 capsule (134 mg total) by mouth daily before breakfast. 01/27/22   Colon Branch, MD  fluticasone [facility]) 50 MCG/ACT nasal spray Instill 2 sprays into both nostrils daily. 12/29/21 12/29/22  Terrilyn Saver, NP  hydrochlorothiazide (HYDRODIURIL) 25 MG tablet TAKE ONE TABLET BY MOUTH ONE TIME DAILY 09/20/21   Colon Branch, MD  Melatonin 10 MG TABS Take 10 mg by mouth at bedtime.    [provider]  multivitamin [facility]) per tablet Take 1 tablet by mouth daily.    [provider]  traZODone (DESYREL) 100 MG tablet TAKE ONE TABLET BY MOUTH DAILY AT BEDTIME 04/18/22   Colon Branch, MD  vitamin E 100 UNIT capsule Take 100 Units by mouth daily.    [provider]  zolpidem (AMBIEN) 10 MG tablet TAKE HALF TO ONE TABLET BY MOUTH AT BEDTIME AS NEEDED FOR SLEEP 01/25/22   Colon Branch, MD     Critical care time: 35 min  JD Rexene Agent [facility] 05/17/2022, 7:44 AM  Please see [email or web address] for pager details.  From 7A-7P if no response, please call [phone]. After hours, please call ELink [phone].

## 2022-05-17 NOTE — Progress Notes (Signed)
PHARMACIST - PHYSICIAN COMMUNICATION  DR:   Vaughan Browner  CONCERNING: IV to Oral Route Change Policy  RECOMMENDATION: This patient is receiving thiamine by the intravenous route.  Based on criteria approved by the Pharmacy and Therapeutics Committee, the intravenous medication(s) is/are being converted to the equivalent oral dose form(s).   DESCRIPTION: These criteria include: The patient is eating (either orally or via tube) and/or has been taking other orally administered medications for a least 24 hours The patient has no evidence of active gastrointestinal bleeding or impaired GI absorption (gastrectomy, short bowel, patient on TNA or NPO).  If you have questions about this conversion, please contact the Pharmacy Department  '[]'$   [phone] )  [facility] '[]'$   [phone] )  [facility] '[x]'$   [phone] )  [facility] '[]'$   [phone] )  [facility] '[]'$   [phone] )  [place], [facility] 05/17/2022 10:52 AM

## 2022-05-18 ENCOUNTER — Inpatient Hospital Stay (HOSPITAL_COMMUNITY): Payer: Medicare Other

## 2022-05-18 DIAGNOSIS — I10 Essential (primary) hypertension: Secondary | ICD-10-CM | POA: Diagnosis not present

## 2022-05-18 DIAGNOSIS — G9340 Encephalopathy, unspecified: Secondary | ICD-10-CM | POA: Diagnosis not present

## 2022-05-18 DIAGNOSIS — R4182 Altered mental status, unspecified: Secondary | ICD-10-CM | POA: Diagnosis not present

## 2022-05-18 LAB — CBC
HCT: 43.5 % (ref 39.0–52.0)
Hemoglobin: 14.7 g/dL (ref 13.0–17.0)
MCH: 30 pg (ref 26.0–34.0)
MCHC: 33.8 g/dL (ref 30.0–36.0)
MCV: 88.8 fL (ref 80.0–100.0)
Platelets: 354 10*3/uL (ref 150–400)
RBC: 4.9 MIL/uL (ref 4.22–5.81)
RDW: 13.8 % (ref 11.5–15.5)
WBC: 10.5 10*3/uL (ref 4.0–10.5)
nRBC: 0 % (ref 0.0–0.2)

## 2022-05-18 LAB — GLUCOSE, CAPILLARY
Glucose-Capillary: 124 mg/dL — ABNORMAL HIGH (ref 70–99)
Glucose-Capillary: 157 mg/dL — ABNORMAL HIGH (ref 70–99)
Glucose-Capillary: 134 mg/dL — ABNORMAL HIGH (ref 70–99)

## 2022-05-18 LAB — VITAMIN B1: Vitamin B1 (Thiamine): 209.7 nmol/L — ABNORMAL HIGH (ref 66.5–200.0)

## 2022-05-18 LAB — BASIC METABOLIC PANEL
Anion gap: 9 (ref 5–15)
BUN: 9 mg/dL (ref 8–23)
CO2: 26 mmol/L (ref 22–32)
Calcium: 8.9 mg/dL (ref 8.9–10.3)
Chloride: 104 mmol/L (ref 98–111)
Creatinine, Ser: 0.8 mg/dL (ref 0.61–1.24)
GFR, Estimated: >60 mL/min (ref >60)
Glucose, Bld: 116 mg/dL — ABNORMAL HIGH (ref 70–99)
Potassium: 2.9 mmol/L — ABNORMAL LOW (ref 3.5–5.1)
Sodium: 139 mmol/L (ref 135–145)

## 2022-05-18 LAB — LACTIC ACID, PLASMA
Lactic Acid, Venous: 2.5 mmol/L (ref 0.5–1.9)
Lactic Acid, Venous: 2.9 mmol/L (ref 0.5–1.9)

## 2022-05-18 LAB — LIPASE, BLOOD: Lipase: 31 U/L (ref 11–51)

## 2022-05-18 LAB — AMYLASE: Amylase: 47 U/L (ref 28–100)

## 2022-05-18 MED ORDER — POTASSIUM CHLORIDE CRYS ER 20 MEQ PO TBCR: 40.0000 meq | EXTENDED_RELEASE_TABLET | ORAL | Status: DC

## 2022-05-18 MED ORDER — LACTATED RINGERS IV BOLUS
1000.0000 mL | Freq: Once | INTRAVENOUS | Status: AC
Start: 2022-05-18 — End: 2022-05-18
  Administered 2022-05-18: 1000 mL via INTRAVENOUS

## 2022-05-18 MED ORDER — POTASSIUM CHLORIDE CRYS ER 20 MEQ PO TBCR
40.0000 meq | EXTENDED_RELEASE_TABLET | Freq: Four times a day (QID) | ORAL | Status: DC
  Administered 2022-05-18: 40 meq via ORAL
  Filled 2022-05-18: qty 2

## 2022-05-18 MED ORDER — SIMETHICONE 80 MG PO CHEW
160.0000 mg | CHEWABLE_TABLET | Freq: Four times a day (QID) | ORAL | Status: DC | PRN
Start: 2022-05-18 — End: 2022-05-18

## 2022-05-18 MED ORDER — BISACODYL 10 MG RE SUPP
10.0000 mg | Freq: Every day | RECTAL | Status: DC | PRN
Start: 2022-05-18 — End: 2022-05-18
  Administered 2022-05-18: 10 mg via RECTAL
  Filled 2022-05-18: qty 1

## 2022-05-18 MED ORDER — POTASSIUM CHLORIDE CRYS ER 20 MEQ PO TBCR: EXTENDED_RELEASE_TABLET | ORAL | 0 refills | Status: DC

## 2022-05-18 MED ORDER — POTASSIUM CHLORIDE 10 MEQ/100ML IV SOLN
10.0000 meq | INTRAVENOUS | Status: AC
  Administered 2022-05-18: 10 meq via INTRAVENOUS
  Filled 2022-05-18: qty 100

## 2022-05-18 MED ORDER — MAGNESIUM OXIDE 400 MG PO TABS: 400.0000 mg | ORAL_TABLET | Freq: Two times a day (BID) | ORAL | 0 refills | Status: DC

## 2022-05-18 NOTE — Progress Notes (Signed)
[place] Progress Note Patient Name: Larry Elliott DOB: 29-Aug-1946 MRN: [id number]   Date of Service  05/18/2022  HPI/Events of Note  Constipation - Patient requests suppository.   eICU Interventions  Plan: Dulcolax Suppository 10 mg PR Q day PRN constipation.      Intervention Category Major Interventions: Other:  Nashley Cordoba Cornelia Copa 05/18/2022, 12:22 AM

## 2022-05-18 NOTE — Discharge Summary (Signed)
Physician Discharge Summary  Larry Elliott:[id number] DOB: July 09, 1946 DOA: 05/16/2022  PCP: Colon Branch, MD  Admit date: 05/16/2022 Discharge date: 05/18/2022  Admitted From: Home Disposition:  Home   Recommendations for Outpatient Follow-up:  Follow up with PCP in 1 weeks Please obtain BMP, magnesium level in 1 week to ensure improved potassium level Please repeat abdominal x-ray during next visit to ensure resolution of ileus. Patient will need outpatient sleep study/sleep evaluation   Home Health: NO, I have offered and discussed with daughter who is a physical therapist, and has been ambulating good in the room today, and they report if they felt he needs it they will do it through his PCP.  Discharge Condition: Stable CODE STATUS:FULL Diet recommendation: Heart Healthy   Brief/Interim Summary:   Larry Elliott is a 76 year old male history significant for CAD, hypertension, hyperlipidemia, prostate cancer, anxiety, depression, and insomnia taking (Trazodone and Ambien) who presented to the ED originally as a code stroke after family found patient minimally responsive post fall.  Per daughter patient has had several days of struggling with insomnia and 2 days prior to arrival unintentionally took multiple sleep aids concerning for overdose and Poison control was called.  Family monitor patient at home and returned to baseline the following morning.     On arrival to ED patient was seen agitated with difficulty to redirect and unable to follow commands.  He is only oriented to self.  Head CT without contrast motion degraded but negative for large stroke or ICH.  During ED stay patient has progressively come more more agitated requiring initiation of Precedex drip.  Lab work significant for albumin 3.1, AST 44, ALT 91, WBC within normal limit.  Toxicology positive for benzodiazepines. Given need for Prexedex drip PCCM consulted for further management and admission, patient was  admitted to ICU, significant improvement of mental status, he has seen by neurology who felt his findings related to polypharmacy.   Discharge Diagnoses:  Principal Problem:   AMS (altered mental status) Active Problems:   Hyperlipidemia   Essential hypertension   Insomnia  Acute toxic encephalopathy -Neurology input greatly appreciated, encephalopathy felt to be secondary to probably some no pharmacy as he is on multiple medications, large amount, of both over-the-counter and prescription sleep aids, including Ambien, Tranexene and, trazodone, and [employer] brand(expected doxylamine), and melatonin, discussed with son and patient, only to continue melatonin and withdrawn and to stop other medications. -Toxicology positive for benzodiazepines, patient is known to be in benzodiazepine at home, he is on clorazepate, has not been discontinued on discharge as would like to avoid abrupt withdrawal, this can be tapered as an outpatient if felt necessary by PCP -UA on arrival unremarkable. B12, folate, B1, ammonia, TSH, salicylate, acetaminophen all within normal limits -EEG without seizure  History of CAD, hypertension, hyperlipidemia -Home medication includes Lipitor, Vasotec, hydrochlorothiazide,   History of anxiety, depression, and insomnia  -Please see above discussion  Ileus -KUB overnight indicative of ileus,  this is most likely provoked by his polypharmacy, as well significant hypokalemia, this morning with no nausea, no vomiting, abdomen exam is benign, no distention, good BM. -Electrolytes were repleted, no evidence of obstruction -will Need KUB as an outpatient in 2 weeks  Hypokalemia -At 2.9, received p.o. before discharge, and he will be discharged on oral potassium and magnesium supplements over the next 5 days, repeat BMP as an outpatient  Left arm swelling -As IV has been infiltrated, cussed with patient and son, if they  wear encouraged to continue with left arm elevation and  warm compress.   Discharge Instructions  Discharge Instructions     Diet - low sodium heart healthy   Complete by: As directed    Discharge instructions   Complete by: As directed    Follow with Primary MD Colon Branch, MD in 7 days   Get CBC, CMP, abd  X ray checked  by Primary MD next visit.    Activity: As tolerated with Full fall precautions use walker/cane & assistance as needed   Disposition Home    Diet: Heart Healthy.   On your next visit with your primary care physician please Get Medicines reviewed and adjusted.   Please request your Prim.MD to go over all Hospital Tests and Procedure/Radiological results at the follow up, please get all Hospital records sent to your Prim MD by signing hospital release before you go home.   If you experience worsening of your admission symptoms, develop shortness of breath, life threatening emergency, suicidal or homicidal thoughts you must seek medical attention immediately by calling 911 or calling your MD immediately  if symptoms less severe.  You Must read complete instructions/literature along with all the possible adverse reactions/side effects for all the Medicines you take and that have been prescribed to you. Take any new Medicines after you have completely understood and accpet all the possible adverse reactions/side effects.   Do not drive, operating heavy machinery, perform activities at heights, swimming or participation in water activities or provide baby sitting services if your were admitted for syncope or siezures until you have seen by Primary MD or a Neurologist and advised to do so again.  Do not drive when taking Pain medications.    Do not take more than prescribed Pain, Sleep and Anxiety Medications  Special Instructions: If you have smoked or chewed Tobacco  in the last 2 yrs please stop smoking, stop any regular Alcohol  and or any Recreational drug use.  Wear Seat belts while driving.   Please  note  You were cared for by a hospitalist during your hospital stay. If you have any questions about your discharge medications or the care you received while you were in the hospital after you are discharged, you can call the unit and asked to speak with the hospitalist on call if the hospitalist that took care of you is not available. Once you are discharged, your primary care physician will handle any further medical issues. Please note that NO REFILLS for any discharge medications will be authorized once you are discharged, as it is imperative that you return to your primary care physician (or establish a relationship with a primary care physician if you do not have one) for your aftercare needs so that they can reassess your need for medications and monitor your lab values.   Increase activity slowly   Complete by: As directed       Allergies as of 05/18/2022       Reactions   Aspirin Other (See Comments)   REACTION: swelling in face   Hydrocodone    Insomnia, mild SOB (w/o cough-wheezing)   Penicillins Other (See Comments)   REACTION: swelling in face Did it involve swelling of the face/tongue/throat, SOB, or low BP? Yes Did it involve sudden or severe rash/hives, skin peeling, or any reaction on the inside of your mouth or nose? No Did you need to seek medical attention at a hospital or doctor's office? Yes When did it last  happen?      [id number] played Hockey If all above answers are "NO", may proceed with cephalosporin use.        Medication List     STOP taking these medications    zolpidem 10 MG tablet Commonly known as: AMBIEN       TAKE these medications    atorvastatin 40 MG tablet Commonly known as: LIPITOR TAKE ONE TABLET BY MOUTH DAILY AT BEDTIME   clorazepate 7.5 MG tablet Commonly known as: TRANXENE Take 7.5 mg by mouth at bedtime as needed for anxiety.   enalapril 5 MG tablet Commonly known as: VASOTEC TAKE ONE TABLET BY MOUTH ONE TIME DAILY    fenofibrate micronized 134 MG capsule Commonly known as: LOFIBRA Take 1 capsule (134 mg total) by mouth daily before breakfast.   FIBER LAXATIVE PO Take 1 tablet by mouth daily.   fluticasone 50 MCG/ACT nasal spray Commonly known as: FLONASE Instill 2 sprays into both nostrils daily. What changed:  when to take this reasons to take this   hydrochlorothiazide 25 MG tablet Commonly known as: HYDRODIURIL TAKE ONE TABLET BY MOUTH ONE TIME DAILY   magnesium oxide 400 MG tablet Commonly known as: MAG-OX Take 1 tablet (400 mg total) by mouth 2 (two) times daily.   potassium chloride SA 20 MEQ tablet Commonly known as: KLOR-CON M Please take 40 MEQ twice daily today, and take 20 mEq twice daily for next 4 days.   traZODone 100 MG tablet Commonly known as: DESYREL TAKE ONE TABLET BY MOUTH DAILY AT BEDTIME        Allergies  Allergen Reactions   Aspirin Other (See Comments)    REACTION: swelling in face   Hydrocodone     Insomnia, mild SOB (w/o cough-wheezing)   Penicillins Other (See Comments)    REACTION: swelling in face  Did it involve swelling of the face/tongue/throat, SOB, or low BP? Yes Did it involve sudden or severe rash/hives, skin peeling, or any reaction on the inside of your mouth or nose? No Did you need to seek medical attention at a hospital or doctor's office? Yes When did it last happen?      [id number] played Hockey If all above answers are "NO", may proceed with cephalosporin use.    Consultations: PCCM Neurology   Procedures/Studies: DG Abd Portable 1V  Result Date: 05/18/2022 CLINICAL DATA:  Abdominal pain. EXAM: PORTABLE ABDOMEN - 1 VIEW COMPARISON:  Lumbar spine AP 03/29/2022. FINDINGS: There is increased aeration throughout the intestinal tract. Small bowel aeration is seen diffusely with dilated interspersed with normal caliber segments, with dilated segments measuring up to 3.5 cm. Colonic gas is seen through to the rectum. There is no  supine evidence of free air. No pathologic calcification is seen with chronic pelvic phleboliths again shown. Visceral shadows are unremarkable. Lung bases are clear. L4-5 fusion hardware is again noted. IMPRESSION: 1. Increased diffuse intestinal tract aeration, with some small bowel segments dilated up to 3.5 cm. 2. Favor ileus as etiology over obstruction, although a partial SBO could cause a similar picture. Follow-up as indicated. 3. No other significant radiographic findings. Electronically Signed   By: Telford Nab M.D.   On: 05/18/2022 04:31   ECHOCARDIOGRAM COMPLETE  Result Date: 05/16/2022    ECHOCARDIOGRAM REPORT   Patient Name:   BREANDAN PEOPLE Date of Exam: 05/16/2022 Medical Rec #:  [id number]        Height:       70.0 in Accession #:  [id number]       Weight:       234.1 lb Date of Birth:  Oct 03, 1946        BSA:          2.232 m Patient Age:    42 years         BP:           97/68 mmHg Patient Gender: M                HR:           60 bpm. Exam Location:  Inpatient Procedure: 2D Echo, Cardiac Doppler and Color Doppler Indications:    Dyspnea  History:        Patient has no prior history of Echocardiogram examinations.                 CAD; Risk Factors:Hypertension.  Sonographer:    Jefferey Pica Referring Phys: [phone] WHITNEY D HARRIS  Sonographer Comments: Technically difficult due to extremely labored breathing. IMPRESSIONS  1. Abnormal septalmotion infeior basal hyypokinesis . Left ventricular ejection fraction, by estimation, is 50 to 55%. The left ventricle has low normal function. The left ventricle has no regional wall motion abnormalities. Left ventricular diastolic parameters were normal.  2. Right ventricular systolic function is mildly reduced. The right ventricular size is normal. There is normal pulmonary artery systolic pressure.  3. The mitral valve is abnormal. No evidence of mitral valve regurgitation. No evidence of mitral stenosis.  4. The aortic valve is tricuspid. There  is mild calcification of the aortic valve. Aortic valve regurgitation is not visualized. Aortic valve sclerosis is present, with no evidence of aortic valve stenosis.  5. Aortic dilatation noted. There is moderate dilatation of the ascending aorta, measuring 44 mm.  6. The inferior vena cava is dilated in size with >50% respiratory variability, suggesting right atrial pressure of 8 mmHg. FINDINGS  Left Ventricle: Abnormal septalmotion infeior basal hyypokinesis. Left ventricular ejection fraction, by estimation, is 50 to 55%. The left ventricle has low normal function. The left ventricle has no regional wall motion abnormalities. The left ventricular internal cavity size was normal in size. There is no left ventricular hypertrophy. Left ventricular diastolic parameters were normal. Right Ventricle: The right ventricular size is normal. No increase in right ventricular wall thickness. Right ventricular systolic function is mildly reduced. There is normal pulmonary artery systolic pressure. The tricuspid regurgitant velocity is 2.28 m/s, and with an assumed right atrial pressure of 10 mmHg, the estimated right ventricular systolic pressure is 24.4 mmHg. Left Atrium: Left atrial size was normal in size. Right Atrium: Right atrial size was normal in size. Pericardium: There is no evidence of pericardial effusion. Mitral Valve: The mitral valve is abnormal. There is mild thickening of the mitral valve leaflet(s). Mild mitral annular calcification. No evidence of mitral valve regurgitation. No evidence of mitral valve stenosis. Tricuspid Valve: The tricuspid valve is normal in structure. Tricuspid valve regurgitation is not demonstrated. No evidence of tricuspid stenosis. Aortic Valve: The aortic valve is tricuspid. There is mild calcification of the aortic valve. Aortic valve regurgitation is not visualized. Aortic valve sclerosis is present, with no evidence of aortic valve stenosis. Aortic valve peak gradient measures  6.0 mmHg. Pulmonic Valve: The pulmonic valve was normal in structure. Pulmonic valve regurgitation is not visualized. No evidence of pulmonic stenosis. Aorta: Aortic dilatation noted. There is moderate dilatation of the ascending aorta, measuring 44 mm. Venous: The inferior vena cava is dilated  in size with greater than 50% respiratory variability, suggesting right atrial pressure of 8 mmHg. IAS/Shunts: The interatrial septum was not well visualized.  LEFT VENTRICLE PLAX 2D LVIDd:         5.05 cm   Diastology LVIDs:         3.90 cm   LV e' medial:    10.90 cm/s LV PW:         1.15 cm   LV E/e' medial:  5.9 LV IVS:        1.15 cm   LV e' lateral:   8.27 cm/s LVOT diam:     2.20 cm   LV E/e' lateral: 7.8 LV SV:         88 LV SV Index:   39 LVOT Area:     3.80 cm  IVC IVC diam: 2.50 cm LEFT ATRIUM             Index        RIGHT ATRIUM           Index LA diam:        4.15 cm 1.86 cm/m   RA Area:     12.70 cm LA Vol (A2C):   55.8 ml 25.00 ml/m  RA Volume:   28.20 ml  12.64 ml/m LA Vol (A4C):   25.5 ml 11.43 ml/m LA Biplane Vol: 38.1 ml 17.07 ml/m  AORTIC VALVE                 PULMONIC VALVE AV Area (Vmax): 3.30 cm     PV Vmax:       0.40 m/s AV Vmax:        122.00 cm/s  PV Peak grad:  0.6 mmHg AV Peak Grad:   6.0 mmHg LVOT Vmax:      106.00 cm/s LVOT Vmean:     71.800 cm/s LVOT VTI:       0.231 m  AORTA Ao Root diam: 3.55 cm Ao Asc diam:  4.40 cm MITRAL VALVE               TRICUSPID VALVE MV Area (PHT): 2.87 cm    TR Peak grad:   20.8 mmHg MV Decel Time: 264 msec    TR Vmax:        228.00 cm/s MV E velocity: 64.70 cm/s MV A velocity: 79.70 cm/s  SHUNTS MV E/A ratio:  0.81        Systemic VTI:  0.23 m                            Systemic Diam: 2.20 cm Jenkins Rouge MD Electronically signed by Jenkins Rouge MD Signature Date/Time: 05/16/2022/4:32:56 PM    Final    EEG adult  Result Date: 05/16/2022 Lora Havens, MD     05/16/2022 12:15 PM Patient Name: CONNIE HILGERT MRN: [id number] Epilepsy Attending:  Lora Havens Referring Physician/Provider: Donnetta Simpers, MD Date: 05/16/2022 Duration: 22.09 mins Patient history: 76 year old male with altered mental status.  EEG to evaluate for seizure. Level of alertness: Awake AEDs during EEG study: Ativan Technical aspects: This EEG study was done with scalp electrodes positioned according to the 10-20 International system of electrode placement. Electrical activity was acquired at a sampling rate of '500Hz'$  and reviewed with a high frequency filter of '70Hz'$  and a low frequency filter of '1Hz'$ . EEG data were recorded continuously and digitally stored. Description: EEG showed continuous generalized 3  to 6 Hz theta-delta slowing admixed with an excessive amount of 15 to 18 Hz beta activity distributed symmetrically and diffusely.  Hyperventilation and photic stimulation were not performed.   ABNORMALITY - Continuous slow, generalized - Excessive beta, generalized IMPRESSION: This study is suggestive of moderate diffuse encephalopathy, nonspecific etiology. The excessive beta activity seen in the background is most likely due to the effect of benzodiazepine and is a benign EEG pattern. No seizures or epileptiform discharges were seen throughout the recording. Lora Havens   DG CHEST PORT 1 VIEW  Result Date: 05/16/2022 CLINICAL DATA:  Possible code stroke.  Found down. EXAM: PORTABLE CHEST 1 VIEW COMPARISON:  Radiographs 06/14/2010 and 09/28/2007. FINDINGS: 0846 hours. Lower lung volumes. Allowing for that, the heart size and mediastinal contours are stable. There is mildly increased opacity at the left lung base which may reflect atelectasis or aspiration. The right lung appears clear. No significant pleural effusion or pneumothorax. No acute osseous findings are identified. There are postsurgical changes in the left humeral head. Telemetry leads overlie the chest. IMPRESSION: Lower lung volumes with asymmetric left basilar atelectasis or early infiltrate. Cannot  exclude aspiration. Electronically Signed   By: Richardean Sale M.D.   On: 05/16/2022 08:55   CT HEAD CODE STROKE WO CONTRAST  Result Date: 05/16/2022 CLINICAL DATA:  Code stroke. EXAM: CT HEAD WITHOUT CONTRAST TECHNIQUE: Contiguous axial images were obtained from the base of the skull through the vertex without intravenous contrast. RADIATION DOSE REDUCTION: This exam was performed according to the departmental dose-optimization program which includes automated exposure control, adjustment of the mA and/or kV according to patient size and/or use of iterative reconstruction technique. COMPARISON:  None Available. FINDINGS: Severely motion degraded examination. Brain: There is no mass, hemorrhage or extra-axial collection. The size and configuration of the ventricles and extra-axial CSF spaces are normal. There is hypoattenuation of the periventricular white matter, most commonly indicating chronic ischemic microangiopathy. Vascular: No abnormal hyperdensity of the major intracranial arteries or dural venous sinuses. No intracranial atherosclerosis. Skull: The visualized skull base, calvarium and extracranial soft tissues are normal. Sinuses/Orbits: No fluid levels or advanced mucosal thickening of the visualized paranasal sinuses. No mastoid or middle ear effusion. The orbits are normal. ASPECTS [facility] Stroke Program Early CT Score) - Ganglionic level infarction (caudate, lentiform nuclei, internal capsule, insula, M1-M3 cortex): 7 - Supraganglionic infarction (M4-M6 cortex): 3 Total score (0-10 with 10 being normal): 10 IMPRESSION: 1. Severely motion degraded examination. No acute intracranial abnormality. 2. ASPECTS is 10. These results were communicated to Dr. Donnetta Simpers at 2:17 am on 05/16/2022 by text page via the [facility] messaging system. Electronically Signed   By: Ulyses Jarred M.D.   On: 05/16/2022 02:19      Subjective: No significant events overnight, he still reports poor night sleep, BM  earlier this morning, otherwise denies any complaints.  Discharge Exam: Vitals:   05/18/22 0400 05/18/22 0800  BP: (!) 158/95 (!) 171/104  Pulse: (!) 102 100  Resp: 20 20  Temp:  98.7 F (37.1 C)  SpO2: 93%    Vitals:   05/17/22 2000 05/18/22 0247 05/18/22 0400 05/18/22 0800  BP: (!) 144/90 137/84 (!) 158/95 (!) 171/104  Pulse: (!) 104 99 (!) 102 100  Resp: '19 20 20 20  '$ Temp: 98 F (36.7 C) 98 F (36.7 C)  98.7 F (37.1 C)  TempSrc: Oral Oral  Oral  SpO2: 95% 90% 93%   Weight:        General: Pt  is alert, awake, not in acute distress Cardiovascular: RRR, S1/S2 +, no rubs, no gallops Respiratory: CTA bilaterally, no wheezing, no rhonchi Abdominal: Soft, NT, ND, bowel sounds + Extremities: Left arm edema, no tenderness, no erythema.    The results of significant diagnostics from this hospitalization (including imaging, microbiology, ancillary and laboratory) are listed below for reference.     Microbiology: Recent Results (from the past 240 hour(s))  MRSA Next Gen by PCR, Nasal     Status: Abnormal   Collection Time: 05/16/22  9:21 AM   Specimen: Nasal Mucosa; Nasal Swab  Result Value Ref Range Status   MRSA by PCR Next Gen DETECTED (A) NOT DETECTED Final    Comment: RESULT CALLED TO, READ BACK BY AND VERIFIED WITH: RN Junious Silk [phone] AT 58 BY CM (NOTE) The GeneXpert MRSA Assay (FDA approved for NASAL specimens only), is one component of a comprehensive MRSA colonization surveillance program. It is not intended to diagnose MRSA infection nor to guide or monitor treatment for MRSA infections. Test performance is not FDA approved in patients less than 54 years old. Performed at [facility], [place] [address] [place], [place] [address]      Labs: BNP (last 3 results) No results for input(s): "BNP" in the last 8760 hours. Basic Metabolic Panel: Recent Labs  Lab 05/16/22 0158 05/16/22 0205 05/16/22 1611 05/17/22 0552 05/18/22 0313  NA 142 143 143  143 139  K 3.7 3.6 3.9 3.7 2.9*  CL 111 108  --  114* 104  CO2 24  --   --  22 26  GLUCOSE 109* 107*  --  115* 116*  BUN 13 13  --  11 9  CREATININE 0.76 0.60*  --  0.66 0.80  CALCIUM 8.9  --   --  8.3* 8.9  MG  --   --   --  2.1  --   PHOS  --   --   --  2.6  --    Liver Function Tests: Recent Labs  Lab 05/16/22 0158  AST 44*  ALT 91*  ALKPHOS 84  BILITOT 0.5  PROT 6.2*  ALBUMIN 3.1*   Recent Labs  Lab 05/18/22 0438  LIPASE 31  AMYLASE 47   Recent Labs  Lab 05/16/22 0546  AMMONIA 31   CBC: Recent Labs  Lab 05/16/22 0158 05/16/22 0205 05/16/22 1611 05/17/22 0552 05/18/22 0313  WBC 9.0  --   --  8.6 10.5  NEUTROABS 6.3  --   --   --   --   HGB 14.4 14.3 12.2* 12.9* 14.7  HCT 42.5 42.0 36.0* 39.7 43.5  MCV 90.2  --   --  92.5 88.8  PLT 342  --   --  266 354   Cardiac Enzymes: No results for input(s): "CKTOTAL", "CKMB", "CKMBINDEX", "TROPONINI" in the last 168 hours. BNP: Invalid input(s): "POCBNP" CBG: Recent Labs  Lab 05/17/22 1504 05/17/22 2137 05/18/22 0157 05/18/22 0516 05/18/22 0819  GLUCAP 91 97 124* 157* 134*   D-Dimer No results for input(s): "DDIMER" in the last 72 hours. Hgb A1c No results for input(s): "HGBA1C" in the last 72 hours. Lipid Profile No results for input(s): "CHOL", "HDL", "LDLCALC", "TRIG", "CHOLHDL", "LDLDIRECT" in the last 72 hours. Thyroid function studies Recent Labs    05/16/22 0546  TSH 1.892   Anemia work up Recent Labs    05/16/22 0546  VITAMINB12 399  FOLATE 32.0   Urinalysis    Component Value Date/Time  COLORURINE YELLOW 05/16/2022 0520   APPEARANCEUR HAZY (A) 05/16/2022 0520   LABSPEC 1.029 05/16/2022 0520   PHURINE 5.0 05/16/2022 0520   GLUCOSEU NEGATIVE 05/16/2022 0520   GLUCOSEU NEGATIVE 10/08/2014 1055   HGBUR NEGATIVE 05/16/2022 0520   BILIRUBINUR NEGATIVE 05/16/2022 0520   BILIRUBINUR Negative 10/08/2014 1040   KETONESUR NEGATIVE 05/16/2022 0520   PROTEINUR NEGATIVE 05/16/2022 0520    UROBILINOGEN 0.2 10/08/2014 1055   UROBILINOGEN 2.0 10/08/2014 1040   NITRITE NEGATIVE 05/16/2022 0520   LEUKOCYTESUR NEGATIVE 05/16/2022 0520   Sepsis Labs Recent Labs  Lab 05/16/22 0158 05/17/22 0552 05/18/22 0313  WBC 9.0 8.6 10.5   Microbiology Recent Results (from the past 240 hour(s))  MRSA Next Gen by PCR, Nasal     Status: Abnormal   Collection Time: 05/16/22  9:21 AM   Specimen: Nasal Mucosa; Nasal Swab  Result Value Ref Range Status   MRSA by PCR Next Gen DETECTED (A) NOT DETECTED Final    Comment: RESULT CALLED TO, READ BACK BY AND VERIFIED WITH: RN Junious Silk [phone] AT 51 BY CM (NOTE) The GeneXpert MRSA Assay (FDA approved for NASAL specimens only), is one component of a comprehensive MRSA colonization surveillance program. It is not intended to diagnose MRSA infection nor to guide or monitor treatment for MRSA infections. Test performance is not FDA approved in patients less than 7 years old. Performed at [facility], [place] [address], [place], [place] [address]      Time coordinating discharge: Over 30 minutes  SIGNED:   Phillips Climes, MD  [facility] 05/18/2022, 8:48 AM Pager   If 7PM-7AM, please contact night-coverage [email or web address] Password [email or web address]

## 2022-05-18 NOTE — Progress Notes (Signed)
[facility] Progress Note Patient Name: Larry Elliott DOB: [birth year] MRN: [id number]   Date of Service  05/18/2022  HPI/Events of Note  Multiple issues: 1. Review of EKG reveals sinus tachycardia. Otherwise normal EKG. 2. Lactic Acid = 2.5.  eICU Interventions  Will bolus with LR 1 liter IV over 1 hour now.      Intervention Category Major Interventions: Other:  Crislyn Willbanks Cornelia Copa 05/18/2022, 6:03 AM

## 2022-05-18 NOTE — Progress Notes (Signed)
At 0700, RN in pt's room, phlebotomist drawing blood and states pt was getting back in bed by himself when she arrived. Noted small amts of liquid stool on ground and monitor cords and IV tubing all tangled together. Pt doesn't remember if someone helped him to [facility]. Reminded pt again not to get up by himself.   Noted IV to left AC swollen. RN stopped LR bolus and IV potassium infusion immediately and heat packs obtained for arm. Report given to day shift RN.

## 2022-05-18 NOTE — Discharge Instructions (Signed)
Follow with Primary MD Colon Branch, MD in 7 days   Get CBC, CMP, abd  X ray checked  by Primary MD next visit.    Activity: As tolerated with Full fall precautions use walker/cane & assistance as needed   Disposition Home    Diet: Heart Healthy.   On your next visit with your primary care physician please Get Medicines reviewed and adjusted.   Please request your Prim.MD to go over all Hospital Tests and Procedure/Radiological results at the follow up, please get all Hospital records sent to your Prim MD by signing hospital release before you go home.   If you experience worsening of your admission symptoms, develop shortness of breath, life threatening emergency, suicidal or homicidal thoughts you must seek medical attention immediately by calling 911 or calling your MD immediately  if symptoms less severe.  You Must read complete instructions/literature along with all the possible adverse reactions/side effects for all the Medicines you take and that have been prescribed to you. Take any new Medicines after you have completely understood and accpet all the possible adverse reactions/side effects.   Do not drive, operating heavy machinery, perform activities at heights, swimming or participation in water activities or provide baby sitting services if your were admitted for syncope or siezures until you have seen by Primary MD or a Neurologist and advised to do so again.  Do not drive when taking Pain medications.    Do not take more than prescribed Pain, Sleep and Anxiety Medications  Special Instructions: If you have smoked or chewed Tobacco  in the last 2 yrs please stop smoking, stop any regular Alcohol  and or any Recreational drug use.  Wear Seat belts while driving.   Please note  You were cared for by a hospitalist during your hospital stay. If you have any questions about your discharge medications or the care you received while you were in the hospital after you are  discharged, you can call the unit and asked to speak with the hospitalist on call if the hospitalist that took care of you is not available. Once you are discharged, your primary care physician will handle any further medical issues. Please note that NO REFILLS for any discharge medications will be authorized once you are discharged, as it is imperative that you return to your primary care physician (or establish a relationship with a primary care physician if you do not have one) for your aftercare needs so that they can reassess your need for medications and monitor your lab values.

## 2022-05-18 NOTE — Progress Notes (Signed)
[facility] Progress Note Patient Name: Larry Elliott DOB: [birth year] MRN: [id number]   Date of Service  05/18/2022  HPI/Events of Note  Multiple issues: 1. Review of abdominal film reveals: 1. Increased diffuse intestinal tract aeration, with some small bowel segments dilated up to 3.5 cm. 2. Favor ileus as etiology over obstruction, although a partial SBO could cause a similar picture. Follow-up as indicated. 3. No other significant radiographic findings. 2. Hypokalemia - K+ = 2.9 and Creatinine = 0.8 3. Nursing reports diaphoresis.   eICU Interventions  Plan: Mylicon 003 mg PO Q 6 hours PRN flatulence or abdominal distention.  K+ already replaced by hospitalist. EKG STAT.      Intervention Category Major Interventions: Electrolyte abnormality - evaluation and management  Maressa Apollo Eugene 05/18/2022, 5:32 AM

## 2022-05-18 NOTE — Progress Notes (Signed)
Reviewed d/c instructions with pt and son, no concerns noted

## 2022-05-18 NOTE — Progress Notes (Signed)
eLink Physician-Brief Progress Note Patient Name: Larry Elliott DOB: [birth year] MRN: [id number]   Date of Service  05/18/2022  HPI/Events of Note  Patient c/o abdominal pain. CBC and BMP already ordered for the AM.   eICU Interventions  Plan: Send CBC and BMP now. Amylase. Lipase and Lactic Acid STAT. Portable abdominal film STAT.     Intervention Category Major Interventions: Other:  Judieth Mckown Cornelia Copa 05/18/2022, 3:30 AM

## 2022-05-18 NOTE — Progress Notes (Addendum)
Since start of shift, pt frequently requesting to sit on BSC due to urge for BM. Passing gas but unable to have BM. Denies nausea or abd pain.  Noted in chart, 1 large BM and 4 small BMs documented during day on 7/25; pt does not remember having BM.    Pt willing to try suppository; [facility] called for order.  After suppository pt had 3 small pieces of BM and gas but still feeling urge to have BM. Pt unable to sleep due to needing to get on [facility] frequently. Has been to [facility] at least 10 times.   [facility] called and notified. Call received from Dr. Oletta Darter and discussed with MD. Jeananne Rama xray and labs done.   RN later called careline and notified of xray done and pt now having some abd cramping and feeling sweaty. EKG done per order and MD placed orders for IV fluids and potassium.

## 2022-05-18 NOTE — Progress Notes (Signed)
MD notified pt and son to take more potassium when he gets home, one dose already given earlier this a.m, iv k d/c.

## 2022-05-19 ENCOUNTER — Other Ambulatory Visit: Payer: Self-pay | Admitting: *Deleted

## 2022-05-19 NOTE — Patient Outreach (Signed)
  Care Coordination [facility] Note Transition Care Management Unsuccessful Follow-up Telephone Call  Date of discharge and from where:  [phone] [facility]  Attempts:  1st Attempt  Reason for unsuccessful TCM follow-up call:  Left voice message  [place] Care Management [phone]

## 2022-05-20 ENCOUNTER — Other Ambulatory Visit: Payer: Self-pay | Admitting: *Deleted

## 2022-05-20 ENCOUNTER — Encounter: Payer: Self-pay | Admitting: Internal Medicine

## 2022-05-20 DIAGNOSIS — M48062 Spinal stenosis, lumbar region with neurogenic claudication: Secondary | ICD-10-CM | POA: Diagnosis not present

## 2022-05-20 NOTE — Patient Outreach (Signed)
  Care Coordination TOC Note Transition Care Management Unsuccessful Follow-up Telephone Call  Date of discharge and from where:  05/18/22 [facility]  Attempts:  2nd Attempt  Reason for unsuccessful TCM follow-up call:  Left voice message.  Emelia Loron RN, BSN [place] [phone] [email or web address]

## 2022-05-20 NOTE — Telephone Encounter (Signed)
See message from Pt's daughter that was already sent earlier today.

## 2022-05-23 ENCOUNTER — Other Ambulatory Visit: Payer: Self-pay | Admitting: *Deleted

## 2022-05-23 ENCOUNTER — Encounter: Payer: Self-pay | Admitting: Internal Medicine

## 2022-05-23 ENCOUNTER — Ambulatory Visit (INDEPENDENT_AMBULATORY_CARE_PROVIDER_SITE_OTHER): Payer: Medicare Other | Admitting: Internal Medicine

## 2022-05-23 VITALS — BP 126/84 | HR 94 | Temp 98.0°F | Resp 18 | Ht 70.0 in | Wt 231.4 lb

## 2022-05-23 DIAGNOSIS — G4700 Insomnia, unspecified: Secondary | ICD-10-CM | POA: Diagnosis not present

## 2022-05-23 DIAGNOSIS — G9341 Metabolic encephalopathy: Secondary | ICD-10-CM | POA: Diagnosis not present

## 2022-05-23 DIAGNOSIS — F419 Anxiety disorder, unspecified: Secondary | ICD-10-CM | POA: Diagnosis not present

## 2022-05-23 DIAGNOSIS — E878 Other disorders of electrolyte and fluid balance, not elsewhere classified: Secondary | ICD-10-CM | POA: Diagnosis not present

## 2022-05-23 DIAGNOSIS — F32A Depression, unspecified: Secondary | ICD-10-CM | POA: Diagnosis not present

## 2022-05-23 LAB — BASIC METABOLIC PANEL
BUN: 23 mg/dL (ref 6–23)
CO2: 27 mEq/L (ref 19–32)
Calcium: 9.9 mg/dL (ref 8.4–10.5)
Chloride: 102 mEq/L (ref 96–112)
Creatinine, Ser: 0.75 mg/dL (ref 0.40–1.50)
GFR: 86.42 mL/min (ref >60.00)
Glucose, Bld: 111 mg/dL — ABNORMAL HIGH (ref 70–99)
Potassium: 3.9 mEq/L (ref 3.5–5.1)
Sodium: 140 mEq/L (ref 135–145)

## 2022-05-23 LAB — MAGNESIUM: Magnesium: 2 mg/dL (ref 1.5–2.5)

## 2022-05-23 NOTE — Patient Instructions (Addendum)
  HEALTHY SLEEP Sleep hygiene: Basic rules for a good night's sleep  Sleep only as much as you need to feel rested and then get out of bed  Keep a regular sleep schedule  Avoid forcing sleep  Exercise regularly for at least 20 minutes, preferably 4 to 5 hours before bedtime  Avoid caffeinated beverages after breakfast   Avoid alcohol near bedtime: no "night cap"  Avoid smoking, especially in the evening  Do not go to bed hungry  Adjust bedroom environment  Avoid prolonged use of light-emitting screens before bedtime   Deal with your worries before bedtime   Trazodone at nighttime Tranxene only as needed  GO TO THE LAB : Get the blood work     GO TO THE FRONT DESK, [place] back for   a checkup in 2 months

## 2022-05-23 NOTE — Assessment & Plan Note (Signed)
Hospital follow-up Metabolic encephalopathy: Recently admitted to the hospital with mental status changes, see HPI for summary of the admission. Prior to the admission, he had insomnia and was taking  Ambien, Tranxene (a leftover) , trazodone, OTC sleeping pills, high doses of melatonin. Was discharged home on trazodone and Tranxene as needed only. Feeling better.  Mental status is completely back to normal. Plan: Continue trazodone for the treatment of depression Stay on Tranxene for the treatment of insomnia Gabapentin: Also for the last few days on gabapentin for chronic pain prescribed elsewhere, that seems to be helping. Extensive counseling about good sleep habits including avoidance of caffeine in any form. At some point will consider change trazodone to SSRI. Was recommended by the hospital team including neurology >>>  no more Ambien. Anxiety, depression, insomnia: See above Hypokalemia, hypomagnesemia: s/p high doses of potassium p.o., never filled the prescription for magnesium.  Checking labs DJD: See above, on gabapentin LUTS: Some nocturia, no other LUTS, UDS - 05/16/2022.  Observation SBO?  No symptoms of such, no need to do a KUB. RTC 2 months.

## 2022-05-23 NOTE — Patient Outreach (Signed)
  Care Coordination TOC Note Transition Care Management Unsuccessful Follow-up Telephone Call  Date of discharge and from where:  05/18/22 [facility]  Attempts:  3rd Attempt  Reason for unsuccessful TCM follow-up call:  Left voice message.  Emelia Loron RN, BSN [place] [phone] [email or web address]

## 2022-05-23 NOTE — Progress Notes (Signed)
Subjective:    Patient ID: Larry Elliott, male    DOB: 03-15-1946, 76 y.o.   MRN: [id number]  DOS:  05/23/2022 Type of visit - description: Hospital follow-up, here with his daughter  Admitted to hospital and discharged 05/18/2022  [facility] @ the  ER as a code stroke, family found him confused/ and minimally responsive after a fall. The days prior to the ER visit, he had insomnia and took multiple sleep aids. At the ER he was disoriented, CT head no acute. Drug screen:  + for benzos. At the ER  he become agitated and prescribed Precedex, admitted to ICU.  For his acute toxic encephalopathy, saw  Neurology >>> symptoms felt to be due to polypharmacy including Ambien, Tranxene ( a left over),  trazodone, OTC sleeping medication (? Doxylamine),  melatonin, benadryl .  Was recommended to continue only with trazodone clorazepate.  Also become bloated and a KUB showed question of SBO.  Review of Systems Since he left the hospital is doing great. Essentially back to normal, alert and oriented x3. Has been taking potassium as recommended. Was recently started on gabapentin for back pain. It is sleeping better. He is somewhat restless but no depressed, slightly anxious mostly about his wife. Some nocturia without dysuria or gross hematuria SBO?  Currently w/ good appetite, no nausea or vomiting.  Normal bowel movements.   Past Medical History:  Diagnosis Date   Anxiety and depression 11/04/2013   Arthritis    BCC (basal cell carcinoma), leg    Right calf, L nape of neck at hairline   CAD (coronary artery disease)    CP, cath, non-obstructive   Diverticulosis    colon   Erectile dysfunction following radical prostatectomy    Hyperlipidemia    Hypertension    Insomnia    Melanoma ([place]) 2010   face, sees derm    Prostate CA ([place]) 10/08   s/p robotic surgery   Spermatocele    Left    Past Surgical History:  Procedure Laterality Date   APPENDECTOMY     HERNIA REPAIR      KNEE ARTHROSCOPY Left    meniscal tear   PROSTATECTOMY  07-2007    robotic,  for prostate cancer     SHOULDER ARTHROSCOPY  07-09-2013   Dr Tamera Punt    TRANSFORAMINAL LUMBAR INTERBODY FUSION (TLIF) WITH PEDICLE SCREW FIXATION 1 LEVEL Right 12/04/2019   Procedure: RIGHT-SIDED TRANSFORAMINAL LUMBAR INTERBODY FUSION LUMBAR FOUR THROUGH FIVE WITH INSTRUMENTATION AND ALLOGRAFT;  Surgeon: Phylliss Bob, MD;  Location: [facility];  Service: Orthopedics;  Laterality: Right;    Current Outpatient Medications  Medication Instructions   atorvastatin (LIPITOR) 40 MG tablet TAKE ONE TABLET BY MOUTH DAILY AT BEDTIME   Calcium Polycarbophil (FIBER LAXATIVE PO) 1 tablet, Oral, Daily,     clorazepate (TRANXENE) 7.5 mg, Oral, At bedtime PRN   enalapril (VASOTEC) 5 MG tablet TAKE ONE TABLET BY MOUTH ONE TIME DAILY   fenofibrate micronized (LOFIBRA) 134 mg, Oral, Daily before breakfast   fluticasone (FLONASE) 50 MCG/ACT nasal spray Instill 2 sprays into both nostrils daily.   gabapentin (NEURONTIN) 100 mg, Oral, Daily at bedtime   hydrochlorothiazide (HYDRODIURIL) 25 MG tablet TAKE ONE TABLET BY MOUTH ONE TIME DAILY   traZODone (DESYREL) 100 MG tablet TAKE ONE TABLET BY MOUTH DAILY AT BEDTIME       Objective:   Physical Exam BP 126/84   Pulse 94   Temp 98 F (36.7 C) (Oral)   Resp 18  Ht '5\' 10"'$  (1.778 m)   Wt 231 lb 6 oz (105 kg)   SpO2 96%   BMI 33.20 kg/m  General:   Well developed, NAD, BMI noted.  HEENT:  Normocephalic . Face symmetric, atraumatic Lungs:  CTA B Normal respiratory effort, no intercostal retractions, no accessory muscle use. Heart: RRR,  no murmur.  Abdomen:  Not distended, soft, non-tender. No rebound or rigidity.   Skin: Not pale. Not jaundice Lower extremities: no pretibial edema bilaterally  Neurologic:  alert & oriented X3.  Speech normal, gait unassisted, transfer is limited by DJD  psych--  Cognition and judgment appear intact.  Cooperative with normal attention  span and concentration.  Behavior appropriate. No anxious or depressed appearing.     Assessment     Assessment  Hyperglycemia c HTN Hyperlipidemia Anxiety depression insomnia  dc clonazepam - not effective,  8-16, rx trazodone  CAD, non-obstructive Prostate cancer-- 2008, robotic surgery, no incontinence, Dr Alinda Money: now f/u per PCP as of 05-2018 DJD ED past surgery  spermatocele, left Skin --Melanoma 2010, BCCs  does not see derm regularly as off 01-2022 LE edema R>L (-) US DVT 2020 ABI 09/2019 WNL  PLAN: Hospital follow-up Metabolic encephalopathy: Recently admitted to the hospital with mental status changes, see HPI for summary of the admission. Prior to the admission, he had insomnia and was taking  Ambien, Tranxene (a leftover) , trazodone, OTC sleeping pills, high doses of melatonin. Was discharged home on trazodone and Tranxene as needed only. Feeling better.  Mental status is completely back to normal. Plan: Continue trazodone for the treatment of depression Stay on Tranxene for the treatment of insomnia Gabapentin: Also for the last few days on gabapentin for chronic pain prescribed elsewhere, that seems to be helping. Extensive counseling about good sleep habits including avoidance of caffeine in any form. At some point will consider change trazodone to SSRI. Was recommended by the hospital team including neurology >>>  no more Ambien. Anxiety, depression, insomnia: See above Hypokalemia, hypomagnesemia: s/p high doses of potassium p.o., never filled the prescription for magnesium.  Checking labs DJD: See above, on gabapentin LUTS: Some nocturia, no other LUTS, UDS - 05/16/2022.  Observation SBO?  No symptoms of such, no need to do a KUB. RTC 2 months.

## 2022-05-27 ENCOUNTER — Ambulatory Visit: Payer: Medicare Other | Admitting: Internal Medicine

## 2022-06-07 ENCOUNTER — Encounter: Payer: Self-pay | Admitting: Internal Medicine

## 2022-06-08 ENCOUNTER — Encounter: Payer: Self-pay | Admitting: Family Medicine

## 2022-06-08 ENCOUNTER — Ambulatory Visit (INDEPENDENT_AMBULATORY_CARE_PROVIDER_SITE_OTHER): Payer: Medicare Other | Admitting: Family Medicine

## 2022-06-08 VITALS — BP 132/84 | HR 97 | Ht 70.0 in | Wt 241.0 lb

## 2022-06-08 DIAGNOSIS — R600 Localized edema: Secondary | ICD-10-CM | POA: Diagnosis not present

## 2022-06-08 LAB — CBC
HCT: 42.1 % (ref 39.0–52.0)
Hemoglobin: 13.9 g/dL (ref 13.0–17.0)
MCHC: 33 g/dL (ref 30.0–36.0)
MCV: 91.2 fl (ref 78.0–100.0)
Platelets: 370 10*3/uL (ref 150.0–400.0)
RBC: 4.62 Mil/uL (ref 4.22–5.81)
RDW: 14.2 % (ref 11.5–15.5)
WBC: 7.6 10*3/uL (ref 4.0–10.5)

## 2022-06-08 LAB — COMPREHENSIVE METABOLIC PANEL
ALT: 25 U/L (ref 0–53)
AST: 22 U/L (ref 0–37)
Albumin: 4.3 g/dL (ref 3.5–5.2)
Alkaline Phosphatase: 59 U/L (ref 39–117)
BUN: 16 mg/dL (ref 6–23)
CO2: 30 mEq/L (ref 19–32)
Calcium: 9.8 mg/dL (ref 8.4–10.5)
Chloride: 102 mEq/L (ref 96–112)
Creatinine, Ser: 0.79 mg/dL (ref 0.40–1.50)
GFR: 85.05 mL/min (ref >60.00)
Glucose, Bld: 87 mg/dL (ref 70–99)
Potassium: 4.3 mEq/L (ref 3.5–5.1)
Sodium: 140 mEq/L (ref 135–145)
Total Bilirubin: 0.5 mg/dL (ref 0.2–1.2)
Total Protein: 6.6 g/dL (ref 6.0–8.3)

## 2022-06-08 LAB — BRAIN NATRIURETIC PEPTIDE: Pro B Natriuretic peptide (BNP): 14 pg/mL (ref 0.0–100.0)

## 2022-06-08 NOTE — Patient Instructions (Addendum)
-   Labs today. If stable we may give you an extra few doses of a fluid pill - we will let you know based on results.  - Recommend elevating feet as much as possible during the day, wearing compression socks throughout the day, minimizing sodium intake.  - Monitor for any new symptoms and let us know changes. - Recommend following up next week to check progress.

## 2022-06-08 NOTE — Progress Notes (Signed)
   Acute Office Visit  Subjective:     Patient ID: Larry Elliott, male    DOB: 09-05-1946, 76 y.o.   MRN: [id number]  CC: feet swelling   HPI Patient is in today for lower extremity edema.  Patient states that several weeks ago he started to notice his feet were swelling. He has not had any recent changes to diet (tries to eat low sodium diet) and has not had any changes to activity level. He takes daily HCTZ and reports compliance. Patient denies any chest pain, palpitations, dyspnea, wheezing, recurrent headaches, vision changes.    ROS All review of systems negative except what is listed in the HPI      Objective:    BP 132/84   Pulse 97   Ht '5\' 10"'$  (1.778 m)   Wt 241 lb (109.3 kg)   BMI 34.58 kg/m    Physical Exam Vitals reviewed.  Constitutional:      General: He is not in acute distress.    Appearance: Normal appearance. He is obese. He is not ill-appearing.  HENT:     Head: Normocephalic and atraumatic.  Cardiovascular:     Rate and Rhythm: Normal rate and regular rhythm.  Pulmonary:     Effort: Pulmonary effort is normal.     Breath sounds: Normal breath sounds. No wheezing, rhonchi or rales.  Musculoskeletal:        General: No tenderness. Normal range of motion.  Feet:     Right foot:     Skin integrity: Skin integrity normal. No skin breakdown, erythema or warmth.     Left foot:     Skin integrity: Skin integrity normal. No skin breakdown, erythema or warmth.     Comments: BLE 3+ pitting edema Skin:    Capillary Refill: Capillary refill takes less than 2 seconds.     Findings: No erythema or lesion.  Neurological:     General: No focal deficit present.     Mental Status: He is alert and oriented to person, place, and time. Mental status is at baseline.  Psychiatric:        Mood and Affect: Mood normal.        Behavior: Behavior normal.        Thought Content: Thought content normal.        Judgment: Judgment normal.     No results found  for any visits on 06/08/22.      Assessment & Plan:   Problem List Items Addressed This Visit   None Visit Diagnoses     Bilateral lower extremity edema    -  Primary - no cardiopulm symptoms; stable - Labs today. If stable we may give you an extra few doses of a fluid pill - we will let you know based on results.  - Recommend elevating feet as much as possible during the day, wearing compression socks throughout the day, minimizing sodium intake.  - Monitor for any new symptoms and let us know changes. - Recommend following up next week to check progress.     Relevant Orders   CBC   Comprehensive metabolic panel   Brain natriuretic peptide       No orders of the defined types were placed in this encounter.   Return in about 1 week (around 06/15/2022), or if symptoms worsen or fail to improve, for edema f/u .  Terrilyn Saver, NP

## 2022-06-09 NOTE — Progress Notes (Signed)
Labs look great. No signs of fluid overload. Likely dependent edema (gravity causing the swelling to stay in your feet). Continue all meds as prescribed, recommend elevating your legs as often as possible, wearing compression socks during the day, and trying to avoid sodium in your diet. It would be okay to take an extra half tablet of your hydrochlorothiazide for the next 2-3 days to see if that helps a little as well.

## 2022-06-12 ENCOUNTER — Other Ambulatory Visit: Payer: Self-pay | Admitting: Internal Medicine

## 2022-06-15 ENCOUNTER — Encounter: Payer: Self-pay | Admitting: Family Medicine

## 2022-06-15 ENCOUNTER — Ambulatory Visit (INDEPENDENT_AMBULATORY_CARE_PROVIDER_SITE_OTHER): Payer: Medicare Other | Admitting: Family Medicine

## 2022-06-15 VITALS — BP 127/73 | HR 89 | Temp 97.9°F | Ht 70.0 in | Wt 244.4 lb

## 2022-06-15 DIAGNOSIS — R600 Localized edema: Secondary | ICD-10-CM

## 2022-06-15 NOTE — Patient Instructions (Signed)
For the swelling in your lower extremities, be sure to elevate your legs when able, mind the salt intake, stay physically active and consider wearing compression stockings.  There are medications we can use to help get fluid off, but with your lack of symptoms, we can hold off on this for now.  Let us know if you need anything.

## 2022-06-15 NOTE — Progress Notes (Signed)
Chief Complaint  Patient presents with   Follow-up    Larry Elliott here for bilateral leg swelling.  Duration: 5 weeks Hx of prolonged bedrest, recent surgery, travel or injury? No Pain the calf? No SOB? No Personal or family history of clot or bleeding disorder? No Hx of heart failure, renal failure, hepatic failure? No   Past Medical History:  Diagnosis Date   Anxiety and depression 11/04/2013   Arthritis    BCC (basal cell carcinoma), leg    Right calf, L nape of neck at hairline   CAD (coronary artery disease)    CP, cath, non-obstructive   Diverticulosis    colon   Erectile dysfunction following radical prostatectomy    Hyperlipidemia    Hypertension    Insomnia    Melanoma ([place]) 2010   face, sees derm    Prostate CA ([place]) 10/08   s/p robotic surgery   Spermatocele    Left    Current Outpatient Medications:    atorvastatin (LIPITOR) 40 MG tablet, TAKE ONE TABLET BY MOUTH DAILY AT BEDTIME (Patient taking differently: Take 40 mg by mouth at bedtime.), Disp: 90 tablet, Rfl: 0   Calcium Polycarbophil (FIBER LAXATIVE PO), Take 1 tablet by mouth daily., Disp: , Rfl:    clorazepate (TRANXENE) 7.5 MG tablet, Take 1 tablet by mouth at bedtime as needed for anxiety, Disp: 30 tablet, Rfl: 0   enalapril (VASOTEC) 5 MG tablet, TAKE ONE TABLET BY MOUTH ONE TIME DAILY (Patient taking differently: Take 5 mg by mouth daily.), Disp: 90 tablet, Rfl: 1   fenofibrate micronized (LOFIBRA) 134 MG capsule, Take 1 capsule (134 mg total) by mouth daily before breakfast., Disp: 90 capsule, Rfl: 1   fluticasone (FLONASE) 50 MCG/ACT nasal spray, Instill 2 sprays into both nostrils daily., Disp: 16 g, Rfl: 0   hydrochlorothiazide (HYDRODIURIL) 25 MG tablet, TAKE ONE TABLET BY MOUTH ONE TIME DAILY (Patient taking differently: Take 25 mg by mouth daily.), Disp: 90 tablet, Rfl: 1   traZODone (DESYREL) 100 MG tablet, TAKE ONE TABLET BY MOUTH DAILY AT BEDTIME (Patient taking differently: Take 100  mg by mouth at bedtime.), Disp: 90 tablet, Rfl: 0  BP 127/73   Pulse 89   Temp 97.9 F (36.6 C) (Oral)   Ht '5\' 10"'$  (1.778 m)   Wt 244 lb 6 oz (110.8 kg)   SpO2 96%   BMI 35.06 kg/m  Gen- awake, alert, appears stated age Heart- RRR, no murmurs/gallups, 2+  pitting LE edema b/l tapering at knees Lungs- CTAB, normal effort w/o accessory muscle use MSK- no calf pain to palpation b/l Psych: Age appropriate judgment and insight  Bilateral lower extremity edema  He is actually not on gabapentin. Edema not improved on higher HCTZ dosage. Go back to what Dr. Larose Kells recs at 25 mg/d. Elevate legs, decrease Na, stay as active as possible, cont compression stockings. On Echo from last mo, some mild RV systolic dysfunction F/u prn. Pt voiced understanding and agreement to the plan.  I spent 20 min w the pt discussing the above plan in addition to reviewing his chart including his recent echo, labs and visit last week.   [place], DO 06/15/22  11:16 AM

## 2022-07-06 ENCOUNTER — Other Ambulatory Visit: Payer: Self-pay | Admitting: Family

## 2022-07-07 DIAGNOSIS — Z23 Encounter for immunization: Secondary | ICD-10-CM | POA: Diagnosis not present

## 2022-07-14 ENCOUNTER — Other Ambulatory Visit: Payer: Self-pay | Admitting: Internal Medicine

## 2022-07-19 ENCOUNTER — Telehealth: Payer: Self-pay

## 2022-07-19 MED ORDER — CLORAZEPATE DIPOTASSIUM 7.5 MG PO TABS: 7.5000 mg | ORAL_TABLET | Freq: Every evening | ORAL | 1 refills | Status: DC | PRN

## 2022-07-19 NOTE — Telephone Encounter (Signed)
Requesting: clorazepate 7.'5mg'$   Contract: 08/18/21 UDS: 05/16/22 performed at ED Last Visit: 05/23/22 Next Visit: 07/22/22 Last Refill: 06/13/22 #30 and 0RF  Please Advise

## 2022-07-19 NOTE — Telephone Encounter (Signed)
PDMP okay, Rx sent 

## 2022-07-20 ENCOUNTER — Other Ambulatory Visit: Payer: Self-pay | Admitting: Internal Medicine

## 2022-07-22 ENCOUNTER — Encounter: Payer: Self-pay | Admitting: Internal Medicine

## 2022-07-22 ENCOUNTER — Ambulatory Visit (INDEPENDENT_AMBULATORY_CARE_PROVIDER_SITE_OTHER): Payer: Medicare Other | Admitting: Internal Medicine

## 2022-07-22 VITALS — BP 136/72 | HR 72 | Temp 98.2°F | Resp 16 | Ht 70.0 in | Wt 254.4 lb

## 2022-07-22 DIAGNOSIS — R600 Localized edema: Secondary | ICD-10-CM

## 2022-07-22 DIAGNOSIS — Z23 Encounter for immunization: Secondary | ICD-10-CM

## 2022-07-22 DIAGNOSIS — L989 Disorder of the skin and subcutaneous tissue, unspecified: Secondary | ICD-10-CM | POA: Diagnosis not present

## 2022-07-22 DIAGNOSIS — R635 Abnormal weight gain: Secondary | ICD-10-CM

## 2022-07-22 DIAGNOSIS — I10 Essential (primary) hypertension: Secondary | ICD-10-CM

## 2022-07-22 MED ORDER — FUROSEMIDE 20 MG PO TABS: 20.0000 mg | ORAL_TABLET | Freq: Every day | ORAL | 0 refills | Status: DC

## 2022-07-22 NOTE — Patient Instructions (Addendum)
Stop hydrochlorothiazide  Start furosemide 20 mg every morning  Watch carefully how much salt you eat.  That less salt did better.  See the information below.  Keep your legs elevated 1 hour in the morning and 1 hour in the afternoon  Vaccines I recommend:  Covid booster RSV vaccine  Check the  blood pressure regularly BP GOAL is between 110/65 and  135/85. If it is consistently higher or lower, let me know     [place], [place] back for   blood work in 10 days  Come back for a checkup in 3 months    DASH Eating Plan DASH stands for Dietary Approaches to Stop Hypertension. The DASH eating plan is a healthy eating plan that has been shown to: Reduce high blood pressure (hypertension). Reduce your risk for type 2 diabetes, heart disease, and stroke. Help with weight loss. What are tips for following this plan? Reading food labels Check food labels for the amount of salt (sodium) per serving. Choose foods with less than 5 percent of the Daily Value of sodium. Generally, foods with less than 300 milligrams (mg) of sodium per serving fit into this eating plan. To find whole grains, look for the word "whole" as the first word in the ingredient list. Shopping Buy products labeled as "low-sodium" or "no salt added." Buy fresh foods. Avoid canned foods and pre-made or frozen meals. Cooking Avoid adding salt when cooking. Use salt-free seasonings or herbs instead of table salt or sea salt. Check with your health care provider or pharmacist before using salt substitutes. Do not fry foods. Cook foods using healthy methods such as baking, boiling, grilling, roasting, and broiling instead. Cook with heart-healthy oils, such as olive, canola, avocado, soybean, or sunflower oil. Meal planning  Eat a balanced diet that includes: 4 or more servings of fruits and 4 or more servings of vegetables each day. Try to fill one-half of your plate with  fruits and vegetables. 6-8 servings of whole grains each day. Less than 6 oz (170 g) of lean meat, poultry, or fish each day. A 3-oz (85-g) serving of meat is about the same size as a deck of cards. One egg equals 1 oz (28 g). 2-3 servings of low-fat dairy each day. One serving is 1 cup (237 mL). 1 serving of nuts, seeds, or beans 5 times each week. 2-3 servings of heart-healthy fats. Healthy fats called omega-3 fatty acids are found in foods such as walnuts, flaxseeds, fortified milks, and eggs. These fats are also found in cold-water fish, such as sardines, salmon, and mackerel. Limit how much you eat of: Canned or prepackaged foods. Food that is high in trans fat, such as some fried foods. Food that is high in saturated fat, such as fatty meat. Desserts and other sweets, sugary drinks, and other foods with added sugar. Full-fat dairy products. Do not salt foods before eating. Do not eat more than 4 egg yolks a week. Try to eat at least 2 vegetarian meals a week. Eat more home-cooked food and less restaurant, buffet, and fast food. Lifestyle When eating at a restaurant, ask that your food be prepared with less salt or no salt, if possible. If you drink alcohol: Limit how much you use to: 0-1 drink a day for women who are not pregnant. 0-2 drinks a day for men. Be aware of how much alcohol is in your drink. In the [place] one drink equals one 12  oz bottle of beer (355 mL), one 5 oz glass of wine (148 mL), or one 1 oz glass of hard liquor (44 mL). General information Avoid eating more than 2,300 mg of salt a day. If you have hypertension, you may need to reduce your sodium intake to 1,500 mg a day. Work with your health care provider to maintain a healthy body weight or to lose weight. Ask what an ideal weight is for you. Get at least 30 minutes of exercise that causes your heart to beat faster (aerobic exercise) most days of the week. Activities may include walking, swimming, or  biking. Work with your health care provider or dietitian to adjust your eating plan to your individual calorie needs. What foods should I eat? Fruits All fresh, dried, or frozen fruit. Canned fruit in natural juice (without added sugar). Vegetables Fresh or frozen vegetables (raw, steamed, roasted, or grilled). Low-sodium or reduced-sodium tomato and vegetable juice. Low-sodium or reduced-sodium tomato sauce and tomato paste. Low-sodium or reduced-sodium canned vegetables. Grains Whole-grain or whole-wheat bread. Whole-grain or whole-wheat pasta. Brown rice. [place]. Bulgur. Whole-grain and low-sodium cereals. Pita bread. Low-fat, low-sodium crackers. Whole-wheat flour tortillas. Meats and other proteins Skinless chicken or [place]. Ground chicken or [place]. Pork with fat trimmed off. Fish and seafood. Egg whites. Dried beans, peas, or lentils. Unsalted nuts, nut butters, and seeds. Unsalted canned beans. Lean cuts of beef with fat trimmed off. Low-sodium, lean precooked or cured meat, such as sausages or meat loaves. Dairy Low-fat (1%) or fat-free (skim) milk. Reduced-fat, low-fat, or fat-free cheeses. Nonfat, low-sodium ricotta or cottage cheese. Low-fat or nonfat yogurt. Low-fat, low-sodium cheese. Fats and oils Soft margarine without trans fats. Vegetable oil. Reduced-fat, low-fat, or light mayonnaise and salad dressings (reduced-sodium). Canola, safflower, olive, avocado, soybean, and sunflower oils. Avocado. Seasonings and condiments Herbs. Spices. Seasoning mixes without salt. Other foods Unsalted popcorn and pretzels. Fat-free sweets. The items listed above may not be a complete list of foods and beverages you can eat. Contact a dietitian for more information. What foods should I avoid? Fruits Canned fruit in a light or heavy syrup. Fried fruit. Fruit in cream or butter sauce. Vegetables Creamed or fried vegetables. Vegetables in a cheese sauce. Regular canned vegetables (not  low-sodium or reduced-sodium). Regular canned tomato sauce and paste (not low-sodium or reduced-sodium). Regular tomato and vegetable juice (not low-sodium or reduced-sodium). Angie Fava. Olives. Grains Baked goods made with fat, such as croissants, muffins, or some breads. Dry pasta or rice meal packs. Meats and other proteins Fatty cuts of meat. Ribs. Fried meat. Berniece Salines. Bologna, salami, and other precooked or cured meats, such as sausages or meat loaves. Fat from the back of a pig (fatback). Bratwurst. Salted nuts and seeds. Canned beans with added salt. Canned or smoked fish. Whole eggs or egg yolks. Chicken or [place] with skin. Dairy Whole or 2% milk, cream, and half-and-half. Whole or full-fat cream cheese. Whole-fat or sweetened yogurt. Full-fat cheese. Nondairy creamers. Whipped toppings. Processed cheese and cheese spreads. Fats and oils Butter. Stick margarine. Lard. Shortening. Ghee. Bacon fat. Tropical oils, such as coconut, palm kernel, or palm oil. Seasonings and condiments Onion salt, garlic salt, seasoned salt, table salt, and sea salt. Worcestershire sauce. Tartar sauce. Barbecue sauce. Teriyaki sauce. Soy sauce, including reduced-sodium. Steak sauce. Canned and packaged gravies. Fish sauce. Oyster sauce. Cocktail sauce. Store-bought horseradish. Ketchup. Mustard. Meat flavorings and tenderizers. Bouillon cubes. Hot sauces. Pre-made or packaged marinades. Pre-made or packaged taco seasonings. Relishes. Regular salad dressings. Other  foods Salted popcorn and pretzels. The items listed above may not be a complete list of foods and beverages you should avoid. Contact a dietitian for more information. Where to find more information National Heart, Lung, and Blood Institute: https://wilson-eaton.com/ American Heart Association: www.heart.org Academy of Nutrition and Dietetics: www.eatright.[place]: www.kidney.org Summary The DASH eating plan is a healthy eating plan that  has been shown to reduce high blood pressure (hypertension). It may also reduce your risk for type 2 diabetes, heart disease, and stroke. When on the DASH eating plan, aim to eat more fresh fruits and vegetables, whole grains, lean proteins, low-fat dairy, and heart-healthy fats. With the DASH eating plan, you should limit salt (sodium) intake to 2,300 mg a day. If you have hypertension, you may need to reduce your sodium intake to 1,500 mg a day. Work with your health care provider or dietitian to adjust your eating plan to your individual calorie needs. This information is not intended to replace advice given to you by your health care provider. Make sure you discuss any questions you have with your health care provider. Document Revised: 09/13/2019 Document Reviewed: 09/13/2019 Elsevier Patient Education  [place].

## 2022-07-22 NOTE — Progress Notes (Signed)
Subjective:    Patient ID: Larry Elliott, male    DOB: 09/06/1946, 76 y.o.   MRN: [id number]  DOS:  07/22/2022 Type of visit - description: Follow-up  Was recently seen with edema, BNP was okay.  Was rec to take 1/2 extra  tablet of HCTZ for few days.  I noted swelling on exam today, states that from his standpoint he has not improved.  Significant weight gain noted, denies DOE, no orthopnea. Diet has not changed however he is much less active due to hip pain.  Has a skin lesion, right deltoid, no recent bleeding but he thinks it has increased in size.  Not taking gabapentin   Wt Readings from Last 3 Encounters:  07/22/22 254 lb 6 oz (115.4 kg)  06/15/22 244 lb 6 oz (110.8 kg)  06/08/22 241 lb (109.3 kg)   Review of Systems See above   Past Medical History:  Diagnosis Date   Anxiety and depression 11/04/2013   Arthritis    BCC (basal cell carcinoma), leg    Right calf, L nape of neck at hairline   CAD (coronary artery disease)    CP, cath, non-obstructive   Diverticulosis    colon   Erectile dysfunction following radical prostatectomy    Hyperlipidemia    Hypertension    Insomnia    Melanoma ([place]) 2010   face, sees derm    Prostate CA ([place]) 10/08   s/p robotic surgery   Spermatocele    Left    Past Surgical History:  Procedure Laterality Date   APPENDECTOMY     HERNIA REPAIR     KNEE ARTHROSCOPY Left    meniscal tear   PROSTATECTOMY  07-2007    robotic,  for prostate cancer     SHOULDER ARTHROSCOPY  07-09-2013   Dr Tamera Punt    TRANSFORAMINAL LUMBAR INTERBODY FUSION (TLIF) WITH PEDICLE SCREW FIXATION 1 LEVEL Right 12/04/2019   Procedure: RIGHT-SIDED TRANSFORAMINAL LUMBAR INTERBODY FUSION LUMBAR FOUR THROUGH FIVE WITH INSTRUMENTATION AND ALLOGRAFT;  Surgeon: Phylliss Bob, MD;  Location: [facility];  Service: Orthopedics;  Laterality: Right;    Current Outpatient Medications  Medication Instructions   atorvastatin (LIPITOR) 40 mg, Oral, Daily at bedtime    Calcium Polycarbophil (FIBER LAXATIVE PO) 1 tablet, Oral, Daily,     clorazepate (TRANXENE) 7.5 mg, Oral, At bedtime PRN   enalapril (VASOTEC) 5 MG tablet TAKE ONE TABLET BY MOUTH ONE TIME DAILY   fenofibrate micronized (LOFIBRA) 134 mg, Oral, Daily before breakfast   fluticasone (FLONASE) 50 MCG/ACT nasal spray Instill 2 sprays into both nostrils daily.   hydrochlorothiazide (HYDRODIURIL) 25 mg, Oral, Daily   traZODone (DESYREL) 100 mg, Oral, Daily at bedtime       Objective:   Physical Exam BP 136/72   Pulse 72   Temp 98.2 F (36.8 C) (Oral)   Resp 16   Ht '5\' 10"'$  (1.778 m)   Wt 254 lb 6 oz (115.4 kg)   SpO2 91%   BMI 36.50 kg/m  General:   Well developed, NAD, BMI noted. HEENT:  Normocephalic . Face symmetric, atraumatic Lungs:  CTA B Normal respiratory effort, no intercostal retractions, no accessory muscle use. Heart: RRR,  no murmur.    Abdomen: Prominent abdomen compared to few months ago Lower extremities: + to ++/ +++ pitting pretibial edema bilaterally.  Symmetric Skin: At the right deltoid has a 1 x 0.7 cm irregular skin lesion, slightly raised, scaly. Neurologic:  alert & oriented X3.  Speech normal,  gait appropriate for age and unassisted Psych--  Cognition and judgment appear intact.  Cooperative with normal attention span and concentration.  Behavior appropriate. No anxious or depressed appearing.      Assessment    Assessment  Hyperglycemia c HTN Hyperlipidemia Anxiety depression insomnia  dc clonazepam - not effective,  8-16, rx trazodone  CAD, non-obstructive Prostate cancer-- 2008, robotic surgery, no incontinence, Dr Alinda Money: now f/u per PCP as of 05-2018 [facility] ED past surgery  spermatocele, left Skin --Melanoma 2010, BCCs  does not see derm regularly as off 01-2022 LE edema R>L (-) US DVT 2020 ABI 09/2019 WNL  PLAN: Hyperglycemia: Last A1c satisfactory Weight gain: Significant weight gain, suspect this likely due to inactivity.  He does have  lower extremity edema but no DOE or orthopnea.  Dietary advice provided. HTN, edema: BP looks good, edema was transiently better with extra half tablet of HCTZ. Plan: Stop HCTZ, start Lasix, continue Vasotec, BMP and magnesium in 10 days.  Hopefully that will help with edema and keep blood pressure controlled. Also recommend low-salt diet and leg elevation.  See AVS  Depression: Controlled on trazodone Insomnia: Controlled on Tranxene, contract signed Skin lesion, likely SK, history of melanoma, refer back to dermatology for a checkup Vaccine advice: Flu shot today RTC labs 10 days RTC checkup 3 months

## 2022-07-22 NOTE — Addendum Note (Signed)
Addended byDamita Dunnings D on: 07/22/2022 12:58 PM   Modules accepted: Orders

## 2022-07-22 NOTE — Assessment & Plan Note (Addendum)
Hyperglycemia: Last A1c satisfactory Weight gain: Significant weight gain, suspect this likely due to inactivity.  He does have lower extremity edema but no DOE or orthopnea.  Dietary advice provided. HTN, edema: BP looks good, edema was transiently better with extra half tablet of HCTZ. Plan: Stop HCTZ, start Lasix, continue Vasotec, BMP and magnesium in 10 days.  Hopefully that will help with edema and keep blood pressure controlled. Also recommend low-salt diet and leg elevation.  See AVS  Depression: Controlled on trazodone Insomnia: Controlled on Tranxene, contract signed Skin lesion, likely SK, history of melanoma, refer back to dermatology for a checkup Vaccine advice: Flu shot today RTC labs 10 days RTC checkup 3 months

## 2022-07-26 ENCOUNTER — Encounter: Payer: Self-pay | Admitting: Internal Medicine

## 2022-08-01 ENCOUNTER — Other Ambulatory Visit: Payer: Medicare Other

## 2022-08-09 ENCOUNTER — Other Ambulatory Visit (HOSPITAL_BASED_OUTPATIENT_CLINIC_OR_DEPARTMENT_OTHER): Payer: Self-pay

## 2022-08-09 ENCOUNTER — Ambulatory Visit (HOSPITAL_BASED_OUTPATIENT_CLINIC_OR_DEPARTMENT_OTHER)
Admission: RE | Admit: 2022-08-09 | Discharge: 2022-08-09 | Disposition: A | Discharge status: Home / Self Care | Payer: Medicare Other | Source: Ambulatory Visit | Attending: Medical | Admitting: Medical

## 2022-08-09 ENCOUNTER — Ambulatory Visit (INDEPENDENT_AMBULATORY_CARE_PROVIDER_SITE_OTHER): Payer: Medicare Other | Admitting: Medical

## 2022-08-09 ENCOUNTER — Encounter: Payer: Self-pay | Admitting: Medical

## 2022-08-09 VITALS — BP 150/88 | HR 79 | Temp 98.2°F | Resp 18 | Ht 70.0 in | Wt 251.0 lb

## 2022-08-09 DIAGNOSIS — R600 Localized edema: Secondary | ICD-10-CM

## 2022-08-09 DIAGNOSIS — I10 Essential (primary) hypertension: Secondary | ICD-10-CM

## 2022-08-09 DIAGNOSIS — J0140 Acute pansinusitis, unspecified: Secondary | ICD-10-CM

## 2022-08-09 DIAGNOSIS — R059 Cough, unspecified: Secondary | ICD-10-CM

## 2022-08-09 DIAGNOSIS — J40 Bronchitis, not specified as acute or chronic: Secondary | ICD-10-CM

## 2022-08-09 DIAGNOSIS — J0100 Acute maxillary sinusitis, unspecified: Secondary | ICD-10-CM

## 2022-08-09 MED ORDER — BENZONATATE 100 MG PO CAPS
100.0000 mg | ORAL_CAPSULE | Freq: Three times a day (TID) | ORAL | 0 refills | Status: DC | PRN
  Filled 2022-08-09: qty 30, 10d supply, fill #0

## 2022-08-09 MED ORDER — FLUTICASONE PROPIONATE 50 MCG/ACT NA SUSP
2.0000 | Freq: Every day | NASAL | 1 refills | Status: DC
  Filled 2022-08-09: qty 16, 30d supply, fill #0

## 2022-08-09 MED ORDER — AZITHROMYCIN 250 MG PO TABS
ORAL_TABLET | ORAL | 0 refills | Status: AC
  Filled 2022-08-09: qty 6, 5d supply, fill #0

## 2022-08-09 MED ORDER — BENZONATATE 100 MG PO CAPS: 100.0000 mg | ORAL_CAPSULE | Freq: Three times a day (TID) | ORAL | 0 refills | Status: DC | PRN

## 2022-08-09 MED ORDER — AZITHROMYCIN 250 MG PO TABS: ORAL_TABLET | ORAL | 0 refills | Status: DC

## 2022-08-09 MED ORDER — FLUTICASONE PROPIONATE 50 MCG/ACT NA SUSP: 2.0000 | Freq: Every day | NASAL | 1 refills | Status: DC

## 2022-08-09 NOTE — Patient Instructions (Addendum)
Recent nasal congestion for 2 weeks with probable sinus infection and some possible bronchitis sign/symptoms.  Rx azithromycin antibiotic, benzonatate for cough and flonase nasal spray for nasal congestion.   If productive cough worsens with chest congestion let us know.   Pedal edema since summer. Went ahead and placed cxr today. You can get today or later if chest congestion worsens or if edema in legs worsen.  Htn- bp better on recheck but still mild high. Continue vasotec and lasix. Would ask you check your bp daily over next week. Update [place] by calling or my chart in one week on those readings. However if you see any levels above 921 systolic go ahead and notify us earlier as would need bp med adjustment if that were the case.   Follow up in 7-10 days or sooner if needed.

## 2022-08-09 NOTE — Progress Notes (Signed)
Subjective:    Patient ID: Larry Elliott, male    DOB: Dec 15, 1945, 76 y.o.   MRN: [id number]  HPI  Pt in reporting sick for past 2 weeks.  He states about 2 weeks ago acute onset of nasal congestion. Some colored mucus when blows nose along with frontal and maxillary sinus pressure. Some productive cough and chest congestion. No wheezing or sob. Pt did not test for covid over past 2 weeks.  No fever, no chills or sweats. No bodyaches.  Pt has only taken tylenol occasioanal for this. No other over the counter meds.    Htn- bp initially high.    Review of Systems  Constitutional:  Negative for chills, fatigue and fever.  HENT:  Positive for congestion, postnasal drip, sinus pressure and sinus pain. Negative for ear pain.   Respiratory:  Positive for cough. Negative for chest tightness, shortness of breath and wheezing.   Cardiovascular:  Negative for chest pain and palpitations.  Gastrointestinal:  Negative for abdominal pain.  Genitourinary:  Negative for difficulty urinating and enuresis.  Musculoskeletal:  Negative for back pain.    Past Medical History:  Diagnosis Date   Anxiety and depression 11/04/2013   Arthritis    BCC (basal cell carcinoma), leg    Right calf, L nape of neck at hairline   CAD (coronary artery disease)    CP, cath, non-obstructive   Diverticulosis    colon   Erectile dysfunction following radical prostatectomy    Hyperlipidemia    Hypertension    Insomnia    [place]) 2010   face, sees derm    Prostate CA ([place]) 10/08   s/p robotic surgery   Spermatocele    Left     Social History   Socioeconomic History   Marital status: Married    Spouse name: Not on file   Number of children: 2   Years of education: Not on file   Highest education level: Not on file  Occupational History   Occupation: retired [employer] social research officer   Tobacco Use   Smoking status: Never   Smokeless tobacco: Never  Vaping Use   Vaping Use: Never used   Substance and Sexual Activity   Alcohol use: Yes    Alcohol/week: 7.0 standard drinks of alcohol    Types: 7 Glasses of wine per week    Comment: socially; 1 glass of wine/day   Drug use: Never   Sexual activity: Not on file  Other Topics Concern   Not on file  Social History Narrative   Lives w/ wife    Original from [place] Determinants of Health   Financial Resource Strain: Not on file  Food Insecurity: Not on file  Transportation Needs: Not on file  Physical Activity: Not on file  Stress: Not on file  Social Connections: Not on file  Intimate Partner Violence: Not on file    Past Surgical History:  Procedure Laterality Date   APPENDECTOMY     HERNIA REPAIR     KNEE ARTHROSCOPY Left    meniscal tear   PROSTATECTOMY  07-2007    robotic,  for prostate cancer     SHOULDER ARTHROSCOPY  07-09-2013   Dr Tamera Punt    TRANSFORAMINAL LUMBAR INTERBODY FUSION (TLIF) WITH PEDICLE SCREW FIXATION 1 LEVEL Right 12/04/2019   Procedure: RIGHT-SIDED TRANSFORAMINAL LUMBAR INTERBODY FUSION LUMBAR FOUR THROUGH FIVE WITH INSTRUMENTATION AND ALLOGRAFT;  Surgeon: Phylliss Bob, MD;  Location: [facility];  Service: Orthopedics;  Laterality: Right;  Family History  Problem Relation Age of Onset   Coronary artery disease Father 37       dx age 70s   Stroke Mother    Hypertension Neg Hx    Diabetes Neg Hx    Colon cancer Neg Hx    Prostate cancer Neg Hx     Allergies  Allergen Reactions   Ambien [Zolpidem]     Encephalopathy, admitted 04-2022, symptoms suspected to be from [place]   Aspirin Other (See Comments)    REACTION: swelling in face   Hydrocodone     Insomnia, mild SOB (w/o cough-wheezing)   Penicillins Other (See Comments)    REACTION: swelling in face  Did it involve swelling of the face/tongue/throat, SOB, or low BP? Yes Did it involve sudden or severe rash/hives, skin peeling, or any reaction on the inside of your mouth or nose? No Did you need to seek medical  attention at a hospital or doctor's office? Yes When did it last happen?      [id number] played Hockey If all above answers are "NO", may proceed with cephalosporin use.    Current Outpatient Medications on File Prior to Visit  Medication Sig Dispense Refill   atorvastatin (LIPITOR) 40 MG tablet Take 1 tablet (40 mg total) by mouth at bedtime. 90 tablet 1   Calcium Polycarbophil (FIBER LAXATIVE PO) Take 1 tablet by mouth daily.     clorazepate (TRANXENE) 7.5 MG tablet Take 1 tablet (7.5 mg total) by mouth at bedtime as needed for sleep. 30 tablet 1   enalapril (VASOTEC) 5 MG tablet TAKE ONE TABLET BY MOUTH ONE TIME DAILY (Patient taking differently: Take 5 mg by mouth daily.) 90 tablet 1   fenofibrate micronized (LOFIBRA) 134 MG capsule Take 1 capsule (134 mg total) by mouth daily before breakfast. 90 capsule 1   fluticasone (FLONASE) 50 MCG/ACT nasal spray Instill 2 sprays into both nostrils daily. 16 g 0   furosemide (LASIX) 20 MG tablet Take 1 tablet (20 mg total) by mouth daily. 90 tablet 0   traZODone (DESYREL) 100 MG tablet Take 1 tablet (100 mg total) by mouth at bedtime. 90 tablet 0   No current facility-administered medications on file prior to visit.    BP (!) 150/88   Pulse 79   Temp 98.2 F (36.8 C)   Resp 18   Ht '5\' 10"'$  (1.778 m)   Wt 251 lb (113.9 kg)   SpO2 94%   BMI 36.01 kg/m        Objective:   Physical Exam  General- No acute distress. Pleasant patient. Neck- Full range of motion, no jvd Lungs- Clear, even and unlabored. Heart- regular rate and rhythm. Neurologic- CNII- XII grossly intact.   Heent- frontal and maxillary sinus pressure on palpation. On throat exam mild pnd. Ear- canals clear and normal tm.   Lower ext- no pedal edema. Mild symmetric pedal edema. Negative homans signs.      Assessment & Plan:   Patient Instructions  Recent nasal congestion for 2 weeks with probable sinus infection and some possible bronchitis sign/symptoms.  Rx  azithromycin antibiotic, benzonatate for cough and flonase nasal spray for nasal congestion.   If productive cough worsens with chest congestion let us know.   Pedal edema since summer. Went ahead and placed cxr today. You can get today or later if chest congestion worsens or if edema in legs worsen.  Htn- bp better on recheck but still mild high. Continue vasotec and lasix. Would  ask you check your bp daily over next week. Update [place] by calling or my chart in one week on those readings. However if you see any levels above 377 systolic go ahead and notify us earlier as would need bp med adjustment if that were the case.      Mackie Pai, PA-C

## 2022-08-09 NOTE — Addendum Note (Signed)
Addended by: Anabel Halon on: 08/09/2022 09:37 AM   Modules accepted: Orders

## 2022-08-14 ENCOUNTER — Other Ambulatory Visit: Payer: Self-pay | Admitting: Internal Medicine

## 2022-08-26 ENCOUNTER — Encounter: Payer: Self-pay | Admitting: Internal Medicine

## 2022-09-02 ENCOUNTER — Other Ambulatory Visit: Payer: Self-pay | Admitting: Internal Medicine

## 2022-09-04 ENCOUNTER — Encounter: Payer: Self-pay | Admitting: Internal Medicine

## 2022-09-05 NOTE — Telephone Encounter (Signed)
PDMP reviewed, too early

## 2022-09-05 NOTE — Telephone Encounter (Signed)
Requesting: clorazepate 7.'5mg'$   Contract: 07/22/22 UDS: 05/16/22 at hospital Last Visit: 07/22/22 Next Visit: 10/21/22 Last Refill: 07/19/22 #30 and 1RF   Please Advise

## 2022-09-13 DIAGNOSIS — M7062 Trochanteric bursitis, left hip: Secondary | ICD-10-CM | POA: Diagnosis not present

## 2022-09-13 DIAGNOSIS — M7061 Trochanteric bursitis, right hip: Secondary | ICD-10-CM | POA: Diagnosis not present

## 2022-09-29 ENCOUNTER — Other Ambulatory Visit: Payer: Self-pay | Admitting: Internal Medicine

## 2022-10-20 ENCOUNTER — Other Ambulatory Visit: Payer: Self-pay

## 2022-10-21 ENCOUNTER — Ambulatory Visit (INDEPENDENT_AMBULATORY_CARE_PROVIDER_SITE_OTHER): Payer: Medicare Other | Admitting: Internal Medicine

## 2022-10-21 ENCOUNTER — Encounter: Payer: Self-pay | Admitting: Internal Medicine

## 2022-10-21 VITALS — BP 132/86 | HR 89 | Temp 98.0°F | Resp 18 | Ht 70.0 in | Wt 237.0 lb

## 2022-10-21 DIAGNOSIS — F419 Anxiety disorder, unspecified: Secondary | ICD-10-CM | POA: Diagnosis not present

## 2022-10-21 DIAGNOSIS — F32A Depression, unspecified: Secondary | ICD-10-CM | POA: Diagnosis not present

## 2022-10-21 DIAGNOSIS — I10 Essential (primary) hypertension: Secondary | ICD-10-CM | POA: Diagnosis not present

## 2022-10-21 DIAGNOSIS — R739 Hyperglycemia, unspecified: Secondary | ICD-10-CM

## 2022-10-21 LAB — BASIC METABOLIC PANEL
BUN: 11 mg/dL (ref 6–23)
CO2: 31 mEq/L (ref 19–32)
Calcium: 11.1 mg/dL — ABNORMAL HIGH (ref 8.4–10.5)
Chloride: 102 mEq/L (ref 96–112)
Creatinine, Ser: 0.92 mg/dL (ref 0.40–1.50)
GFR: 79.43 mL/min (ref >60.00)
Glucose, Bld: 96 mg/dL (ref 70–99)
Potassium: 4.3 mEq/L (ref 3.5–5.1)
Sodium: 143 mEq/L (ref 135–145)

## 2022-10-21 LAB — HEMOGLOBIN A1C: Hgb A1c MFr Bld: 6.1 % (ref 4.6–6.5)

## 2022-10-21 LAB — MAGNESIUM: Magnesium: 2 mg/dL (ref 1.5–2.5)

## 2022-10-21 MED ORDER — FUROSEMIDE 20 MG PO TABS: 20.0000 mg | ORAL_TABLET | Freq: Every day | ORAL | 1 refills | Status: DC

## 2022-10-21 NOTE — Patient Instructions (Addendum)
Vaccines I recommend:  Covid booster RSV vaccine  Check the  blood pressure regularly BP GOAL is between 110/65 and  135/85. If it is consistently higher or lower, let me know      GO TO THE LAB : Get the blood work     [place], [place] back for checkup in 3 to 4 months

## 2022-10-21 NOTE — Progress Notes (Signed)
Subjective:    Patient ID: Larry Elliott, male    DOB: 04-05-1946, 76 y.o.   MRN: [id number]  DOS:  10/21/2022 Type of visit - description: Follow-up  Since the last office visit is doing well. Weight loss noted. Reports she is doing better with diet.  We also change his diuretics.  Denies fever chills, no headaches.  Denies chest pain or difficulty breathing. Edema has decreased No leg cramps.   Wt Readings from Last 3 Encounters:  10/21/22 237 lb (107.5 kg)  08/09/22 251 lb (113.9 kg)  07/22/22 254 lb 6 oz (115.4 kg)   Review of Systems See above   Past Medical History:  Diagnosis Date   Anxiety and depression 11/04/2013   Arthritis    BCC (basal cell carcinoma), leg    Right calf, L nape of neck at hairline   CAD (coronary artery disease)    CP, cath, non-obstructive   Diverticulosis    colon   Erectile dysfunction following radical prostatectomy    Hyperlipidemia    Hypertension    Insomnia    Melanoma ([place]) 2010   face, sees derm    Prostate CA ([place]) 10/08   s/p robotic surgery   Spermatocele    Left    Past Surgical History:  Procedure Laterality Date   APPENDECTOMY     HERNIA REPAIR     KNEE ARTHROSCOPY Left    meniscal tear   PROSTATECTOMY  07-2007    robotic,  for prostate cancer     SHOULDER ARTHROSCOPY  07-09-2013   Dr Tamera Punt    TRANSFORAMINAL LUMBAR INTERBODY FUSION (TLIF) WITH PEDICLE SCREW FIXATION 1 LEVEL Right 12/04/2019   Procedure: RIGHT-SIDED TRANSFORAMINAL LUMBAR INTERBODY FUSION LUMBAR FOUR THROUGH FIVE WITH INSTRUMENTATION AND ALLOGRAFT;  Surgeon: Phylliss Bob, MD;  Location: [facility];  Service: Orthopedics;  Laterality: Right;    Current Outpatient Medications  Medication Instructions   atorvastatin (LIPITOR) 40 mg, Oral, Daily at bedtime   Calcium Polycarbophil (FIBER LAXATIVE PO) 1 tablet, Oral, Daily,     clorazepate (TRANXENE) 7.5 mg, Oral, At bedtime PRN   enalapril (VASOTEC) 5 mg, Oral, Daily   fenofibrate micronized  (LOFIBRA) 134 mg, Oral, Daily before breakfast   fluticasone (FLONASE) 50 MCG/ACT nasal spray 2 sprays, Each Nare, Daily   furosemide (LASIX) 20 mg, Oral, Daily   traZODone (DESYREL) 100 mg, Oral, Daily at bedtime       Objective:   Physical Exam BP 132/86   Pulse 89   Temp 98 F (36.7 C) (Oral)   Resp 18   Ht '5\' 10"'$  (1.778 m)   Wt 237 lb (107.5 kg)   SpO2 98%   BMI 34.01 kg/m  General:   Well developed, NAD, BMI noted. HEENT:  Normocephalic . Face symmetric, atraumatic Lungs:  CTA B Normal respiratory effort, no intercostal retractions, no accessory muscle use. Heart: RRR,  no murmur.  Lower extremities: +/+++ pretibial edema bilaterally  Skin: Not pale. Not jaundice Neurologic:  alert & oriented X3.  Speech normal, gait appropriate for age and unassisted Psych--  Cognition and judgment appear intact.  Cooperative with normal attention span and concentration.  Behavior appropriate. No anxious or depressed appearing.      Assessment    Assessment  Hyperglycemia c HTN Hyperlipidemia Anxiety depression insomnia  dc clonazepam - not effective,  8-16, rx trazodone  CAD, non-obstructive Prostate cancer-- 2008, robotic surgery, no incontinence, Dr Alinda Money: now f/u per PCP as of 05-2018 [facility] ED past surgery  spermatocele, left Skin --Melanoma 2010, BCCs  does not see derm regularly as off 01-2022 LE edema R>L (-) US DVT 2020 ABI 09/2019 WNL  PLAN: HTN, edema: Was recommended to stop HCTZ and start Lasix, continue Vasotec.  Did not pursue BMP-Mg f/u as recommended. He seems to be doing well, BP today is good, edema and weight have decreased. Plan: BMP, magnesium, continue same medications.  Recommend to start checking BPs at home Anxiety depression insomnia: He sent a message about anxiety being high, today he reports that the issue self resolved. Hyperglycemia: Check A1c Preventive care: COVID and RSV vaccines recommended. RTC 3 to 4 months checkup

## 2022-10-22 NOTE — Assessment & Plan Note (Signed)
HTN, edema: Was recommended to stop HCTZ and start Lasix, continue Vasotec.  Did not pursue BMP-Mg f/u as recommended. He seems to be doing well, BP today is good, edema and weight have decreased. Plan: BMP, magnesium, continue same medications.  Recommend to start checking BPs at home Anxiety depression insomnia: He sent a message about anxiety being high, today he reports that the issue self resolved. Hyperglycemia: Check A1c Preventive care: COVID and RSV vaccines recommended. RTC 3 to 4 months checkup

## 2022-10-26 ENCOUNTER — Telehealth: Payer: Self-pay | Admitting: Internal Medicine

## 2022-10-27 NOTE — Telephone Encounter (Signed)
PDMP okay, Rx sent 

## 2022-10-27 NOTE — Telephone Encounter (Signed)
Requesting: clorazepate 7.'5mg'$   Contract:10/21/22 UDS: Unable to test for in UDS Last Visit: 10/21/22 Next Visit: 03/01/23 Last Refill:  07/19/22 #30 and 1RF   Please Advise

## 2022-11-24 ENCOUNTER — Encounter: Payer: Self-pay | Admitting: Internal Medicine

## 2022-11-24 ENCOUNTER — Ambulatory Visit (INDEPENDENT_AMBULATORY_CARE_PROVIDER_SITE_OTHER): Payer: Medicare Other | Admitting: Internal Medicine

## 2022-11-24 ENCOUNTER — Encounter (HOSPITAL_BASED_OUTPATIENT_CLINIC_OR_DEPARTMENT_OTHER): Payer: Self-pay

## 2022-11-24 ENCOUNTER — Telehealth: Payer: Self-pay

## 2022-11-24 ENCOUNTER — Emergency Department (HOSPITAL_BASED_OUTPATIENT_CLINIC_OR_DEPARTMENT_OTHER)
Admission: EM | Admit: 2022-11-24 | Discharge: 2022-11-24 | Disposition: A | Discharge status: Home / Self Care | Payer: Medicare Other | Attending: Emergency Medicine | Admitting: Emergency Medicine

## 2022-11-24 ENCOUNTER — Emergency Department (HOSPITAL_BASED_OUTPATIENT_CLINIC_OR_DEPARTMENT_OTHER): Payer: Medicare Other

## 2022-11-24 VITALS — BP 226/116 | HR 68 | Temp 97.7°F | Resp 20 | Ht 70.0 in | Wt 249.4 lb

## 2022-11-24 DIAGNOSIS — Z20822 Contact with and (suspected) exposure to covid-19: Secondary | ICD-10-CM | POA: Diagnosis not present

## 2022-11-24 DIAGNOSIS — I10 Essential (primary) hypertension: Secondary | ICD-10-CM | POA: Diagnosis not present

## 2022-11-24 DIAGNOSIS — R5383 Other fatigue: Secondary | ICD-10-CM | POA: Diagnosis not present

## 2022-11-24 DIAGNOSIS — Z85828 Personal history of other malignant neoplasm of skin: Secondary | ICD-10-CM | POA: Insufficient documentation

## 2022-11-24 DIAGNOSIS — R600 Localized edema: Secondary | ICD-10-CM | POA: Insufficient documentation

## 2022-11-24 DIAGNOSIS — Z8546 Personal history of malignant neoplasm of prostate: Secondary | ICD-10-CM | POA: Insufficient documentation

## 2022-11-24 DIAGNOSIS — R9431 Abnormal electrocardiogram [ECG] [EKG]: Secondary | ICD-10-CM | POA: Diagnosis not present

## 2022-11-24 DIAGNOSIS — I509 Heart failure, unspecified: Secondary | ICD-10-CM

## 2022-11-24 DIAGNOSIS — R0602 Shortness of breath: Secondary | ICD-10-CM | POA: Diagnosis not present

## 2022-11-24 LAB — CBC WITH DIFFERENTIAL/PLATELET
Abs Immature Granulocytes: 0.12 10*3/uL — ABNORMAL HIGH (ref 0.00–0.07)
Basophils Absolute: 0.1 10*3/uL (ref 0.0–0.1)
Basophils Relative: 1 %
Eosinophils Absolute: 0.3 10*3/uL (ref 0.0–0.5)
Eosinophils Relative: 4 %
HCT: 44.4 % (ref 39.0–52.0)
Hemoglobin: 14.6 g/dL (ref 13.0–17.0)
Immature Granulocytes: 2 %
Lymphocytes Relative: 15 %
Lymphs Abs: 1.2 10*3/uL (ref 0.7–4.0)
MCH: 30 pg (ref 26.0–34.0)
MCHC: 32.9 g/dL (ref 30.0–36.0)
MCV: 91.4 fL (ref 80.0–100.0)
Monocytes Absolute: 0.9 10*3/uL (ref 0.1–1.0)
Monocytes Relative: 12 %
Neutro Abs: 5.3 10*3/uL (ref 1.7–7.7)
Neutrophils Relative %: 66 %
Platelets: 296 10*3/uL (ref 150–400)
RBC: 4.86 MIL/uL (ref 4.22–5.81)
RDW: 14.7 % (ref 11.5–15.5)
WBC: 7.9 10*3/uL (ref 4.0–10.5)
nRBC: 0 % (ref 0.0–0.2)

## 2022-11-24 LAB — URINALYSIS, ROUTINE W REFLEX MICROSCOPIC
Bilirubin Urine: NEGATIVE
Glucose, UA: NEGATIVE mg/dL
Hgb urine dipstick: NEGATIVE
Ketones, ur: NEGATIVE mg/dL
Leukocytes,Ua: NEGATIVE
Nitrite: NEGATIVE
Protein, ur: NEGATIVE mg/dL
Specific Gravity, Urine: 1.02 (ref 1.005–1.030)
pH: 7 (ref 5.0–8.0)

## 2022-11-24 LAB — BASIC METABOLIC PANEL
Anion gap: 8 (ref 5–15)
BUN: 8 mg/dL (ref 8–23)
CO2: 30 mmol/L (ref 22–32)
Calcium: 9.8 mg/dL (ref 8.9–10.3)
Chloride: 104 mmol/L (ref 98–111)
Creatinine, Ser: 0.72 mg/dL (ref 0.61–1.24)
GFR, Estimated: >60 mL/min (ref >60)
Glucose, Bld: 104 mg/dL — ABNORMAL HIGH (ref 70–99)
Potassium: 4.3 mmol/L (ref 3.5–5.1)
Sodium: 142 mmol/L (ref 135–145)

## 2022-11-24 LAB — TROPONIN I (HIGH SENSITIVITY)
Troponin I (High Sensitivity): 4 ng/L (ref <18)
Troponin I (High Sensitivity): 3 ng/L (ref <18)

## 2022-11-24 LAB — RESP PANEL BY RT-PCR (RSV, FLU A&B, COVID)  RVPGX2
Influenza A by PCR: NEGATIVE
Influenza B by PCR: NEGATIVE
Resp Syncytial Virus by PCR: NEGATIVE
SARS Coronavirus 2 by RT PCR: NEGATIVE

## 2022-11-24 LAB — BRAIN NATRIURETIC PEPTIDE: B Natriuretic Peptide: 19.7 pg/mL (ref 0.0–100.0)

## 2022-11-24 LAB — TSH: TSH: 2.256 u[IU]/mL (ref 0.350–4.500)

## 2022-11-24 NOTE — ED Triage Notes (Signed)
Sent from Dr. Larose Kells upstairs for hypertension, fatigue, possible new onset CHF. Has had increased BLE edema, fatigue x 3 weeks.

## 2022-11-24 NOTE — Patient Instructions (Signed)
We are sending you to the emergency room downstairs.

## 2022-11-24 NOTE — Telephone Encounter (Signed)
LMOM informing Pt's wife that Pt did see Dr. Larose Kells- he has been sent to ED at [facility] for further evaluation.

## 2022-11-24 NOTE — Discharge Instructions (Addendum)
Thank you for coming to [facility] Emergency Department. You were seen for fatigue, leg swelling. We did an exam, labs, and imaging, and these showed no acute findings.  Please follow up with your primary care provider within 1 week.   Do not hesitate to return to the ED or call 911 if you experience: -Worsening symptoms -Chest pain or shortness of breath -Stroke like symptoms including facial droop, trouble speaking, asymmetric weakness, numbness/tingling -Falls or head trauma -Lethargy -Lightheadedness, passing out -Fevers/chills -Anything else that concerns you

## 2022-11-24 NOTE — Telephone Encounter (Signed)
Caller Name Cameron Phone Number Caller is unsure but states her husband should know the number. Patient Name Larry Elliott Patient DOB 07-Nov-1945 Call Type Message Only Information Provided Reason for Call Request for General Office Information Initial Comment Caller states she wants to know if her husband is still in the office. Additional Comment Office hours provided. Disp. Time Disposition Final User 11/24/2022 12:17:07 PM General Information Provided Yes Gaspar Skeeters Call Closed By: Gaspar Skeeters Transaction Date/Time: 11/24/2022 12:14:58 PM (ET)

## 2022-11-24 NOTE — Progress Notes (Signed)
Subjective:    Patient ID: Larry Elliott, male    DOB: 19-Aug-1946, 77 y.o.   MRN: [id number]  DOS:  11/24/2022 Type of visit - description: Acute  The patient's main concern is lack of energy, "no strength,lethargic".  Feeling sleepy. He said that his symptoms started 3 weeks ago but when I repeat the question in reality the weakness has been gradually building up.  Weight gain noted.  Last year his weight fluctuated in the 230s pounds, in September went up to 254, then he lost some weight but now is 249 pounds.  Denies fever or chills.  No headaches No chest pain difficulty breathing.  No palpitations, no orthopnea. No nausea vomiting or dyspepsia. I noted some wheezing, he denies cough or wheezing. Emotionally doing okay, denies major stressors currently. Patient is not sure if he snores but he admits to feeling sleepy. He is still functional taking care of his house.   Wt Readings from Last 3 Encounters:  11/24/22 249 lb 6 oz (113.1 kg)  10/21/22 237 lb (107.5 kg)  08/09/22 251 lb (113.9 kg)     Review of Systems See above   Past Medical History:  Diagnosis Date   Anxiety and depression 11/04/2013   Arthritis    BCC (basal cell carcinoma), leg    Right calf, L nape of neck at hairline   CAD (coronary artery disease)    CP, cath, non-obstructive   Diverticulosis    colon   Erectile dysfunction following radical prostatectomy    Hyperlipidemia    Hypertension    Insomnia    Melanoma ([place]) 2010   face, sees derm    Prostate CA ([place]) 10/08   s/p robotic surgery   Spermatocele    Left    Past Surgical History:  Procedure Laterality Date   APPENDECTOMY     HERNIA REPAIR     KNEE ARTHROSCOPY Left    meniscal tear   PROSTATECTOMY  07-2007    robotic,  for prostate cancer     SHOULDER ARTHROSCOPY  07-09-2013   Dr Tamera Punt    TRANSFORAMINAL LUMBAR INTERBODY FUSION (TLIF) WITH PEDICLE SCREW FIXATION 1 LEVEL Right 12/04/2019   Procedure: RIGHT-SIDED  TRANSFORAMINAL LUMBAR INTERBODY FUSION LUMBAR FOUR THROUGH FIVE WITH INSTRUMENTATION AND ALLOGRAFT;  Surgeon: Phylliss Bob, MD;  Location: [facility];  Service: Orthopedics;  Laterality: Right;    Current Outpatient Medications  Medication Instructions   atorvastatin (LIPITOR) 40 mg, Oral, Daily at bedtime   Calcium Polycarbophil (FIBER LAXATIVE PO) 1 tablet, Oral, Daily,     clorazepate (TRANXENE) 7.5 mg, Oral, At bedtime PRN, for sleep   enalapril (VASOTEC) 5 mg, Oral, Daily   fenofibrate micronized (LOFIBRA) 134 mg, Oral, Daily before breakfast   fluticasone (FLONASE) 50 MCG/ACT nasal spray 2 sprays, Each Nare, Daily   furosemide (LASIX) 20 mg, Oral, Daily   traZODone (DESYREL) 100 mg, Oral, Daily at bedtime       Objective:   Physical Exam BP (!) 168/92   Pulse 68   Temp 97.7 F (36.5 C) (Oral)   Resp 20   Ht '5\' 10"'$  (1.778 m)   Wt 249 lb 6 oz (113.1 kg)   SpO2 96%   BMI 35.78 kg/m  General:   Well developed, NAD, BMI noted.  HEENT:  Normocephalic . Face symmetric, atraumatic + JVD at 45 degrees Lungs:  Increased expiratory time a few wheezes. Normal respiratory effort, no intercostal retractions, no accessory muscle use. Heart: RRR,  no murmur.  Abdomen:  Not distended, soft, non-tender. No rebound or rigidity.  Dullness to percussion noted but is not shifting. Skin: Not pale. Not jaundice Lower extremities: ++/+++ pretibial edema bilaterally  Neurologic:  alert & oriented X3.  Speech normal, gait very limited by DJD. Psych--  Cognition and judgment appear intact.  Cooperative with normal attention span and concentration.  Behavior appropriate. No anxious or depressed appearing.     Assessment     Assessment  Hyperglycemia c HTN Hyperlipidemia Anxiety depression insomnia  dc clonazepam - not effective,  8-16, rx trazodone  CAD, non-obstructive Prostate cancer-- 2008, robotic surgery, no incontinence, Dr Alinda Money: now f/u per PCP as of 05-2018 [facility] ED past  surgery  spermatocele, left Skin --Melanoma 2010, BCCs  does not see derm regularly as off 01-2022 LE edema R>L (-) US DVT 2020 ABI 09/2019 WNL  PLAN: New onset of CHF ?: The patient presents with 3-week history of lack of energy, on exam I noted weight gain, increased edema, some wheezing. He reports no DOE however when he moves around the room he seems to be short of breath. His BP is elevated, upon arrival 168/92, subsequently 226/116. He does not have headache or chest pain. EKG today: No acute. Last echo echo 04-2022: EF 50 to 55%. Suspect new onset CHF, BP is quite elevated, recommend ER, patient agreed. Discussed the case with the ER physician who accepted to see the patient.  I appreciate the help.

## 2022-11-24 NOTE — ED Notes (Signed)
Pt and wife verbalized understanding of d/c instructions and follow up care. Pt wheeled out of ED via wheelchair and then ambulatory to his car with independent steady gait.

## 2022-11-24 NOTE — Telephone Encounter (Signed)
Spoke w/ Pt's wife Marcene Brawn- informed that Pt is downstairs at ED. She verbalized understanding.

## 2022-11-24 NOTE — ED Provider Notes (Signed)
EMERGENCY DEPARTMENT AT [facility] Provider Note   CSN: [id number] Arrival date & time: 11/24/22  1039     History  Chief Complaint  Patient presents with   Fatigue    Larry Elliott is a 77 y.o. male with HTN, HLD, history diverticulosis, prostate cancer, lower back pain, anxiety depression who presents with fatigue, hypertension.   Patient was seen today by PCP Dr. Larose Kells upstairs and sent to ED for hypertension, fatigue, possible new onset CHF. Has had increased BLE edema, fatigue x 3 weeks.   Patient reports feeling "lethargic" and "unsteady on his feet" that started one week ago. Denies f/c, congestion, flu-like symptoms, cough, SOB, DOE, orthopnea, CP, abd pain, N/V/C, urinary symptoms. Has had some diarrhea recently that is more than normal. Denies melena/hematochezia. Does endorse some swelling of the legs bilaterally but unsure if it is new or not.   HPI     Home Medications Prior to Admission medications   Medication Sig Start Date End Date Taking? Authorizing Provider  atorvastatin (LIPITOR) 40 MG tablet Take 1 tablet (40 mg total) by mouth at bedtime. 07/20/22   Colon Branch, MD  Calcium Polycarbophil (FIBER LAXATIVE PO) Take 1 tablet by mouth daily.    [provider]  clorazepate (TRANXENE) 7.5 MG tablet Take 1 tablet by mouth at bedtime as needed for sleep 10/27/22   Colon Branch, MD  enalapril (VASOTEC) 5 MG tablet Take 1 tablet (5 mg total) by mouth daily. 08/15/22   Colon Branch, MD  fenofibrate micronized (LOFIBRA) 134 MG capsule Take 1 capsule (134 mg total) by mouth daily before breakfast. 09/29/22   Colon Branch, MD  fluticasone [facility]) 50 MCG/ACT nasal spray Place 2 sprays into both nostrils daily. 08/09/22   Saguier, Percell Miller, PA-C  furosemide (LASIX) 20 MG tablet Take 1 tablet (20 mg total) by mouth daily. 10/21/22   Colon Branch, MD  traZODone (DESYREL) 100 MG tablet Take 1.5 tablets (150 mg total) by mouth at bedtime. 11/29/22   Colon Branch, MD      Allergies    Ambien [zolpidem], Aspirin, Hydrocodone, and Penicillins    Review of Systems   Review of Systems Review of systems Negative for f/c.  A 10 point review of systems was performed and is negative unless otherwise reported in HPI.  Physical Exam Updated Vital Signs BP (!) 155/109   Pulse 86   Temp 98.2 F (36.8 C) (Oral)   Resp (!) 26   Ht 5' 10"$  (1.778 m)   Wt 113.1 kg   SpO2 92%   BMI 35.78 kg/m  Physical Exam General: Normal appearing male, lying in bed.  HEENT: PERRLA, EOMI, no nystagmus, Sclera anicteric, MMM, trachea midline.  Cardiology: RRR, no murmurs/rubs/gallops. BL radial and DP pulses equal bilaterally.  Resp: Normal respiratory rate and effort. CTAB, no wheezes, rhonchi, crackles.  Abd: Soft, non-tender, non-distended. No rebound tenderness or guarding.  GU: Deferred. MSK: 2+ pitting edema bilaterally, symmetric, no erythema or TTP. No signs of trauma. Extremities without deformity or TTP. No cyanosis or clubbing. Skin: warm, dry. No rashes or lesions. Back: No CVA tenderness Neuro: A&Ox4, CNs II-XII grossly intact. 5/5 strength all extremities. Sensation grossly intact. Normal speech. Tongue protrudes midline. Normal coordination. Psych: Normal mood and affect.   ED Results / Procedures / Treatments   Labs (all labs ordered are listed, but only abnormal results are displayed) Labs Reviewed  CBC WITH DIFFERENTIAL/PLATELET - Abnormal; Notable  for the following components:      Result Value   Abs Immature Granulocytes 0.12 (*)    All other components within normal limits  BASIC METABOLIC PANEL - Abnormal; Notable for the following components:   Glucose, Bld 104 (*)    All other components within normal limits  RESP PANEL BY RT-PCR (RSV, FLU A&B, COVID)  RVPGX2  BRAIN NATRIURETIC PEPTIDE  URINALYSIS, ROUTINE W REFLEX MICROSCOPIC  TSH  TROPONIN I (HIGH SENSITIVITY)  TROPONIN I (HIGH SENSITIVITY)    EKG EKG  Interpretation  Date/Time:  Thursday November 24 2022 10:50:58 EST Ventricular Rate:  80 PR Interval:  171 QRS Duration: 92 QT Interval:  403 QTC Calculation: 465 R Axis:   8 Text Interpretation: Sinus rhythm Confirmed by Cindee Lame [phone]) on 11/24/2022 1:28:05 PM  Radiology CXR: FINDINGS: The heart appears mildly enlarged but stable. The mediastinal contours are stable. Bibasilar pulmonary opacities are similar to the previous studies, most consistent with chronic scarring. No edema, confluent airspace opacity, pneumothorax or significant pleural effusion. Mild degenerative changes in the spine without evidence of acute osseous abnormality.   IMPRESSION: No acute cardiopulmonary process. Chronic bibasilar pulmonary opacities, most consistent with chronic scarring.  Procedures Procedures    Medications Ordered in ED Medications - No data to display  ED Course/ Medical Decision Making/ A&P                          Medical Decision Making Amount and/or Complexity of Data Reviewed Labs: ordered. Decision-making details documented in ED Course. Radiology: ordered. Decision-making details documented in ED Course.    This patient presents to the ED for concern of fatigue, BL LEE; this involves an extensive number of treatment options, and is a complaint that carries with it a high risk of complications and morbidity.  I considered the following differential and admission for this acute, potentially life threatening condition.   MDM:     DDX for generalized weakness includes but is not limited to:  Infectious processes such as UTI, pneumonia, or systemic viral infection; severe metabolic derangements or electrolyte abnormalities; ischemia/ACS or heart failure though he has no chest pain or shortness of breath; anemia, and intracranial/central processes. Patient has no localizing symptoms and no focal neurodeficits on exam to indicate acute ischemic or hemorrhagic stroke.   He has had no acute onset headache or nuchal rigidity to indicate SAH.  Patient notes that he did feel unsteady on his feet but he is ambulating well now, has no vertigo or nystagmus, no coordination deficits to indicate stroke.  PCP did notice bilateral lower extremity edema and patient cannot say if it is better or worse than normal, PCP was concerned about heart failure exacerbation in conjunction with his reported fatigue.  He is not in any respiratory distress, he has normal lung sounds and auscultation, no hypoxia on room air.   Blood pressure is 149/72 mmHg. however patient's primary care physician noted that his blood pressure was greater than [id number] systolic. Patient does not have any symptoms on history nor signs on physical exam concerning for end organ damage secondary to hypertension.   Specifically, based up the patient's presentation, the patient is at sufficiently low risk for: -ACS given no CP, no SOB, normal cardio-pulmonary exam -SAH/stroke given no hx of acute headache/stroke like sxs and normal neurologic exam.  -end organ renal disease given no hematuria      Clinical Course as of 12/10/22 1654  Thu  Nov 24, 2022  1326 B Natriuretic Peptide: 19.7 [HN]  [id number] Basic metabolic panel(!) unremarkable [HN]  1327 Troponin I (High Sensitivity): 4 Neg [HN]  1327 CBC with Differential(!) unremarkable [HN]  1327 DG Chest 2 View FINDINGS: The heart appears mildly enlarged but stable. The mediastinal contours are stable. Bibasilar pulmonary opacities are similar to the previous studies, most consistent with chronic scarring. No edema, confluent airspace opacity, pneumothorax or significant pleural effusion. Mild degenerative changes in the spine without evidence of acute osseous abnormality.  IMPRESSION:  No acute cardiopulmonary process. Chronic bibasilar pulmonary opacities, most consistent with chronic scarring.   [HN]  1502 Troponin I (High Sensitivity): 3 Flat [HN]  1502  Urinalysis, Routine w reflex microscopic -Urine, Clean Catch Neg [HN]  1502 Will also send out TSH [HN]  1506 Resp panel by RT-PCR (RSV, Flu A&B, Covid) Anterior Nasal Swab Neg [HN]  1511 Patient ambulated in the room. States he is ambulating at baseline and he feels well. Wife states he is at his baseline as well.  His lab work is very reassuring.  He has no respiratory distress, shortness of breath or dyspnea, or hypoxia on room air.  Patient will be discharged to f/u with PCP. TSH drawn and can be followed up on MyChart or with PCP, patient aware.  Given specific discharge instructions and return precautions, all questions answered to patient satisfaction. [HN]    Clinical Course User Index [HN] Audley Hose, MD    Labs: I Ordered, and personally interpreted labs.  The pertinent results include:  those listed above  Imaging Studies ordered: I ordered imaging studies including CXR I independently visualized and interpreted imaging. I agree with the radiologist interpretation  Additional history obtained from chart review, PCP who called ahead.   Cardiac Monitoring: The patient was maintained on a cardiac monitor.  I personally viewed and interpreted the cardiac monitored which showed an underlying rhythm of: NSR  Reevaluation: After the interventions noted above, I reevaluated the patient and found that they have :improved  Social Determinants of Health: Patient lives independently   Disposition:  DC w/ discharge instructions/return precautions. All questions answered to patient's satisfaction.    Co morbidities that complicate the patient evaluation  Past Medical History:  Diagnosis Date   Anxiety and depression 11/04/2013   Arthritis    BCC (basal cell carcinoma), leg    Right calf, L nape of neck at hairline   CAD (coronary artery disease)    CP, cath, non-obstructive   Diverticulosis    colon   Erectile dysfunction following radical prostatectomy     Hyperlipidemia    Hypertension    Insomnia    Melanoma ([place]) 2010   face, sees derm    Prostate CA ([place]) 10/08   s/p robotic surgery   Spermatocele    Left     Medicines No orders of the defined types were placed in this encounter.   I have reviewed the patients home medicines and have made adjustments as needed  Problem List / ED Course: Problem List Items Addressed This Visit   None Visit Diagnoses     Other fatigue    -  Primary                   This note was created using dictation software, which may contain spelling or grammatical errors.    Audley Hose, MD 12/10/22 [phone]

## 2022-11-25 NOTE — Assessment & Plan Note (Signed)
New onset of CHF ?: The patient presents with 3-week history of lack of energy, on exam I noted weight gain, increased edema, some wheezing. He reports no DOE however when he moves around the room he seems to be short of breath. His BP is elevated, upon arrival 168/92, subsequently 226/116. He does not have headache or chest pain. EKG today: No acute. Last echo echo 04-2022: EF 50 to 55%. Suspect new onset CHF, BP is quite elevated, recommend ER, patient agreed. Discussed the case with the ER physician who accepted to see the patient.  I appreciate the help.

## 2022-11-27 ENCOUNTER — Encounter: Payer: Self-pay | Admitting: Internal Medicine

## 2022-11-28 NOTE — Telephone Encounter (Signed)
Please arrange a office visit for ER follow-up

## 2022-11-28 NOTE — Telephone Encounter (Signed)
LMOM asking for call back to schedule ED f/u.

## 2022-11-28 NOTE — Telephone Encounter (Signed)
Appt scheduled tomorrow

## 2022-11-29 ENCOUNTER — Ambulatory Visit (INDEPENDENT_AMBULATORY_CARE_PROVIDER_SITE_OTHER): Payer: Medicare Other | Admitting: Internal Medicine

## 2022-11-29 ENCOUNTER — Encounter: Payer: Self-pay | Admitting: Internal Medicine

## 2022-11-29 VITALS — BP 108/64 | HR 95 | Temp 98.4°F | Resp 18 | Ht 70.0 in | Wt 245.1 lb

## 2022-11-29 DIAGNOSIS — F32A Depression, unspecified: Secondary | ICD-10-CM | POA: Diagnosis not present

## 2022-11-29 DIAGNOSIS — M25551 Pain in right hip: Secondary | ICD-10-CM | POA: Diagnosis not present

## 2022-11-29 DIAGNOSIS — I10 Essential (primary) hypertension: Secondary | ICD-10-CM

## 2022-11-29 DIAGNOSIS — G4700 Insomnia, unspecified: Secondary | ICD-10-CM

## 2022-11-29 DIAGNOSIS — F419 Anxiety disorder, unspecified: Secondary | ICD-10-CM

## 2022-11-29 DIAGNOSIS — M25552 Pain in left hip: Secondary | ICD-10-CM | POA: Diagnosis not present

## 2022-11-29 NOTE — Progress Notes (Unsigned)
Subjective:    Patient ID: Larry Elliott, male    DOB: 11-Mar-1946, 77 y.o.   MRN: [id number]  DOS:  11/29/2022 Type of visit - description: Follow-up, here with his wife and son.  Was seen at this office 11/24/2022, he had no energy for few weeks, weight gain, quizzing noted, he was felt to have new onset CHF, referred to the ER. ER workup:  BMP and BNP negative, CBC okay, troponin negative, chest x-ray no acute.  UA negative.  Respiratory panel negative for viruses, TSH normal. EKG: NSR, no acute changes   Since then, the family sent some information. - Taking too many OTC sleep medicines?  They noticed him lethargic, drowsy. - Compliance with medication is questionable.   Wt Readings from Last 3 Encounters:  11/29/22 245 lb 2 oz (111.2 kg)  11/24/22 249 lb 6 oz (113.1 kg)  11/24/22 249 lb 6 oz (113.1 kg)    Review of Systems  At this point, he continues to complain of lack of energy. He feels quite unsteady on his feet had a couple falls without major injury. Insomnia is the main problem for him.  In addition to Tranxene and trazodone he takes multiple OTCs of a different variety. Part of the problem with insomnia is hip pain, bilateral, make sleep very difficult. Because of the poor sleep he takes frequent naps during the daytime. Memory?  Some lapses    Past Medical History:  Diagnosis Date   Anxiety and depression 11/04/2013   Arthritis    BCC (basal cell carcinoma), leg    Right calf, L nape of neck at hairline   CAD (coronary artery disease)    CP, cath, non-obstructive   Diverticulosis    colon   Erectile dysfunction following radical prostatectomy    Hyperlipidemia    Hypertension    Insomnia    Melanoma ([place]) 2010   face, sees derm    Prostate CA ([place]) 10/08   s/p robotic surgery   Spermatocele    Left    Past Surgical History:  Procedure Laterality Date   APPENDECTOMY     HERNIA REPAIR     KNEE ARTHROSCOPY Left    meniscal tear   PROSTATECTOMY   07-2007    robotic,  for prostate cancer     SHOULDER ARTHROSCOPY  07-09-2013   Dr Tamera Punt    TRANSFORAMINAL LUMBAR INTERBODY FUSION (TLIF) WITH PEDICLE SCREW FIXATION 1 LEVEL Right 12/04/2019   Procedure: RIGHT-SIDED TRANSFORAMINAL LUMBAR INTERBODY FUSION LUMBAR FOUR THROUGH FIVE WITH INSTRUMENTATION AND ALLOGRAFT;  Surgeon: Phylliss Bob, MD;  Location: [facility];  Service: Orthopedics;  Laterality: Right;    Current Outpatient Medications  Medication Instructions   atorvastatin (LIPITOR) 40 mg, Oral, Daily at bedtime   Calcium Polycarbophil (FIBER LAXATIVE PO) 1 tablet, Oral, Daily,     clorazepate (TRANXENE) 7.5 mg, Oral, At bedtime PRN, for sleep   enalapril (VASOTEC) 5 mg, Oral, Daily   fenofibrate micronized (LOFIBRA) 134 mg, Oral, Daily before breakfast   fluticasone (FLONASE) 50 MCG/ACT nasal spray 2 sprays, Each Nare, Daily   furosemide (LASIX) 20 mg, Oral, Daily   traZODone (DESYREL) 100 mg, Oral, Daily at bedtime       Objective:   Physical Exam BP 108/64   Pulse 95   Temp 98.4 F (36.9 C) (Oral)   Resp 18   Ht '5\' 10"'$  (1.778 m)   Wt 245 lb 2 oz (111.2 kg)   SpO2 94%   BMI 35.17 kg/m  General:   Well developed, NAD, BMI noted.  HEENT:  Normocephalic . Face symmetric, atraumatic. Neck: No JVD Lungs:  CTA B Normal respiratory effort, no intercostal retractions, no accessory muscle use. Heart: RRR,  no murmur.  Abdomen:  Not distended, soft, non-tender. No rebound or rigidity.   Skin: Not pale. Not jaundice Lower extremities: +/+++ pretibial edema bilaterally  Neurologic:  alert & oriented X3.  Speech normal, gait appropriate for age and unassisted Psych--  Cognition and judgment appear intact.  Cooperative with normal attention span and concentration.  Behavior appropriate. No anxious or depressed appearing.     Assessment    Assessment  Hyperglycemia   HTN Hyperlipidemia Anxiety depression insomnia   dc clonazepam - not effective,  8-16, rx  trazodone. Metabolic encephalopathy admitted July 2023: No more Ambien  CAD, non-obstructive Prostate cancer-- 2008, robotic surgery, no incontinence, Dr Alinda Money: now f/u per PCP as of 05-2018 [facility] ED past surgery  spermatocele, left Skin --Melanoma 2010, BCCs  does not see derm regularly as off 01-2022 LE edema R>L (-) US DVT 2020 ABI 09/2019 WNL  PLAN: New onset CHF?  See last office visit, patient was sent to the ER with question of CHF, workup was negative.  He is here for follow-up. In general feeling about the same however today I noticed no quizzing, no JVD, weight has decreased and he has less edema. I wonder if he was not compliant with Lasix and Vasotec for a while which triggered some volume overload. HTN: BP slightly low today, the daughter and son are now helping the patient with medication compliance, BP is okay today, continue enalapril and Lasix. Anxiety depression insomnia: On chart review: - Clonazepam was not effective - Was on Lexapro until approximately 2018, it was stopped for an unclear reason started on trazodone.  We could try SSRIs again if needed. - Developed metabolic encephalopathy July 2023, in part related to Ambien  . Currently on trazodone 100 mg and Tranxene 7.5 mg. Mood is okay but insomnia remains an issue. Plan: Stop all OTCs, increase trazodone 100 mg to 1.5 tablets daily, continue low-dose Tranxene, avoid naps, refer to neuro Hip pain:  That is impart the culprit of difficulty sleeping. Refer back to Ortho, previously benefited from injections, will also need PT. Avoid painkillers, Tylenol as needed. RTC 2 months  Sent message to family    New onset of CHF ?: The patient presents with 3-week history of lack of energy, on exam I noted weight gain, increased edema, some wheezing. He reports no DOE however when he moves around the room he seems to be short of breath. His BP is elevated, upon arrival 168/92, subsequently 226/116. He does not have  headache or chest pain. EKG today: No acute. Last echo echo 04-2022: EF 50 to 55%. Suspect new onset CHF, BP is quite elevated, recommend ER, patient agreed. Discussed the case with the ER physician who accepted to see the patient.  I appreciate the help.

## 2022-11-29 NOTE — Patient Instructions (Addendum)
Tylenol  500 mg OTC 2 tabs a day every 8 hours as needed for pain  Increase trazodone 100 mg: 1.5 tablets daily Okay to take Tranxene 7.5 mg once at bedtime Avoid any other OTC. Be sure you take at least a couple of Tylenols before bedtime to help with pain. Avoid any naps We are referring you to neurology for further management of insomnia  We are referring you to orthopedics, if they do not recommend physical therapy let me know, I will prescribe it.  Otherwise continue same medication, watch your salt intake, check your blood pressure from time to time.   BP GOAL is between 110/65 and  135/85. If it is consistently higher or lower, let me know    GO TO THE FRONT DESK, [place] back for checkup in 2 months   Vaccines I recommend:  Covid booster RSV vaccine

## 2022-11-30 ENCOUNTER — Encounter: Payer: Self-pay | Admitting: Internal Medicine

## 2022-11-30 NOTE — Assessment & Plan Note (Signed)
New onset CHF?  See LOV, Pt was sent to the ER with question of CHF, workup was negative.  He is here for follow-up. In general feeling about the same however today I noticed no wheezing, no JVD, weight has decreased and he has less edema. I wonder if he was not compliant with Lasix and Vasotec for a while which triggered some volume overload. HTN: BP slightly low today, the daughter and son are now helping the patient with medication compliance,  continue enalapril and Lasix. Anxiety depression insomnia: On chart review: - Clonazepam was not effective - Was on Lexapro until approximately 2018, it was stopped for an unclear reason and started on trazodone.  We could try SSRIs again if needed. - Developed metabolic encephalopathy July 2023, in part related to Ambien  . Currently on trazodone 100 mg and Tranxene 7.5 mg. Mood is okay but insomnia remains an issue. Plan:  Stop all OTCs, increase trazodone 100 mg to 1.5 tablets daily, continue low-dose Tranxene, avoid naps, refer to neuro Hip pain:  That is at least partially responsible for his difficulty sleeping. Refer back to Ortho, previously benefited from injections, will also need PT. Avoid painkillers, Tylenol as needed. RTC 2 months

## 2022-12-01 ENCOUNTER — Telehealth: Payer: Self-pay | Admitting: Internal Medicine

## 2022-12-01 NOTE — Telephone Encounter (Signed)
Py's daughter stated [facility] has not received the referral and needs it faxed to [phone].

## 2022-12-01 NOTE — Telephone Encounter (Signed)
Gwen- can you resend please?

## 2022-12-02 ENCOUNTER — Encounter: Payer: Self-pay | Admitting: Internal Medicine

## 2022-12-02 DIAGNOSIS — M160 Bilateral primary osteoarthritis of hip: Secondary | ICD-10-CM | POA: Diagnosis not present

## 2022-12-02 DIAGNOSIS — M1612 Unilateral primary osteoarthritis, left hip: Secondary | ICD-10-CM | POA: Diagnosis not present

## 2022-12-05 ENCOUNTER — Telehealth: Payer: Self-pay

## 2022-12-05 DIAGNOSIS — Z01810 Encounter for preprocedural cardiovascular examination: Secondary | ICD-10-CM

## 2022-12-05 NOTE — Telephone Encounter (Signed)
Received surgical clearance form from [facility]. Pt is needing L hip arthroplasty with Dr. Rhona Raider. Surgery date TBD. Last EKG 11/24/22. Please advise if Pt needs appt.

## 2022-12-06 NOTE — Telephone Encounter (Signed)
Form completed and faxed back to ATTN: Marlin Canary at [facility] is currently not cleared for surgery. Will need cardiology input. Referral placed. Mychart message sent to [name], Pt's daughter informing her. Form sent for scanning.

## 2022-12-06 NOTE — Telephone Encounter (Signed)
Fax failed to [phone], called [facility]- received alt fax number, [phone]. Forms faxed and fax confirmation received.

## 2022-12-06 NOTE — Telephone Encounter (Addendum)
Patient with multiple medical problems, age 77. Please refer to cardiology

## 2022-12-08 ENCOUNTER — Other Ambulatory Visit: Payer: Self-pay | Admitting: Orthopaedic Surgery

## 2022-12-13 ENCOUNTER — Encounter: Payer: Self-pay | Admitting: Internal Medicine

## 2022-12-14 DIAGNOSIS — M1612 Unilateral primary osteoarthritis, left hip: Secondary | ICD-10-CM | POA: Diagnosis not present

## 2022-12-14 NOTE — Care Plan (Signed)
Ortho Bundle Case Management Note  Patient Details  Name: Larry Elliott MRN: [id number] Date of Birth: 1946-07-15  Spoke with patient prior to surgery. Easily confused. Will discharge to home with family to assist. Will speak with daughter. She is a PT. Has rolling walker. OPPT set up with [facility]. Patient and MD in agreement with plan. Choice offered                  DME Arranged:    DME Agency:     HH Arranged:    HH Agency:     Additional Comments: Please contact me with any questions of if this plan should need to change.  Ladell Heads,  [place] Specialist  [phone] 12/14/2022, 4:25 PM

## 2022-12-18 NOTE — Progress Notes (Unsigned)
Cardiology Office Note:    Date:  12/21/2022   ID:  Larry Elliott, DOB 1946-07-19, MRN [id number]  PCP:  Colon Branch, MD  Cardiologist:  None  Electrophysiologist:  None   Referring MD: Colon Branch, MD   Chief Complaint  Patient presents with   Pre-op Exam    History of Present Illness:    Larry Elliott is a 77 y.o. male with a hx of nonobstructive CAD, hypertension, hyperlipidemia, melanoma, prostate cancer who is referred by Dr. Larose Kells for preop evaluation prior to hip surgery.  Echocardiogram 04/2022 showed EF 50 to 55%, inferior basal hypokinesis, mild RV dysfunction, ascending aorta measuring 44 mm.  Cath in 2011 showed minimal CAD.    Denies any chest pain, dyspnea, lightheadedness, syncope, lower extremity edema, or palpitations.  Reports he can walk up flight of stairs without stopping.  Denies any exertional symptoms.  No smoking history.  Family history includes father with heart disease but he is unsure of details.      Past Medical History:  Diagnosis Date   Anxiety and depression 11/04/2013   Arthritis    BCC (basal cell carcinoma), leg    Right calf, L nape of neck at hairline   CAD (coronary artery disease)    CP, cath, non-obstructive   Diverticulosis    colon   Erectile dysfunction following radical prostatectomy    Hyperlipidemia    Hypertension    Insomnia    Melanoma ([place]) 2010   face, sees derm    Prostate CA ([place]) 10/08   s/p robotic surgery   Spermatocele    Left    Past Surgical History:  Procedure Laterality Date   APPENDECTOMY     HERNIA REPAIR     KNEE ARTHROSCOPY Left    meniscal tear   PROSTATECTOMY  07-2007    robotic,  for prostate cancer     SHOULDER ARTHROSCOPY  07-09-2013   Dr Tamera Punt    TRANSFORAMINAL LUMBAR INTERBODY FUSION (TLIF) WITH PEDICLE SCREW FIXATION 1 LEVEL Right 12/04/2019   Procedure: RIGHT-SIDED TRANSFORAMINAL LUMBAR INTERBODY FUSION LUMBAR FOUR THROUGH FIVE WITH INSTRUMENTATION AND ALLOGRAFT;  Surgeon:  Phylliss Bob, MD;  Location: [place] [facility];  Service: Orthopedics;  Laterality: Right;    Current Medications: Current Meds  Medication Sig   atorvastatin (LIPITOR) 40 MG tablet Take 1 tablet (40 mg total) by mouth at bedtime.   clorazepate (TRANXENE) 7.5 MG tablet Take 1 tablet by mouth at bedtime as needed for sleep (Patient not taking: Reported on 12/20/2022)   enalapril (VASOTEC) 5 MG tablet Take 1 tablet (5 mg total) by mouth daily.   fenofibrate micronized (LOFIBRA) 134 MG capsule Take 1 capsule (134 mg total) by mouth daily before breakfast.   fluticasone (FLONASE) 50 MCG/ACT nasal spray Place 2 sprays into both nostrils daily. (Patient not taking: Reported on 12/20/2022)   furosemide (LASIX) 20 MG tablet Take 1 tablet (20 mg total) by mouth daily.   traZODone (DESYREL) 100 MG tablet Take 100 mg by mouth at bedtime.   [DISCONTINUED] Calcium Polycarbophil (FIBER LAXATIVE PO) Take 1 tablet by mouth daily.     Allergies:   Ambien [zolpidem], Aspirin, Hydrocodone, and Penicillins   Social History   Socioeconomic History   Marital status: Married    Spouse name: Not on file   Number of children: 2   Years of education: Not on file   Highest education level: Not on file  Occupational History   Occupation: retired Government social research officer  Tobacco Use   Smoking status: Never   Smokeless tobacco: Never  Vaping Use   Vaping Use: Never used  Substance and Sexual Activity   Alcohol use: Yes    Alcohol/week: 7.0 standard drinks of alcohol    Types: 7 Glasses of wine per week    Comment: socially; 1 glass of wine/day   Drug use: Never   Sexual activity: Not on file  Other Topics Concern   Not on file  Social History Narrative   Lives w/ wife    Original from [place]   Social Determinants of Health   Financial Resource Strain: Not on file  Food Insecurity: Not on file  Transportation Needs: Not on file  Physical Activity: Not on file  Stress: Not on file  Social Connections: Not on file      Family History: The patient's family history includes Coronary artery disease (age of onset: 4) in his father; Stroke in his mother. There is no history of Hypertension, Diabetes, Colon cancer, or Prostate cancer.  ROS:   Please see the history of present illness.     All other systems reviewed and are negative.  EKGs/Labs/Other Studies Reviewed:    The following studies were reviewed today:   EKG:   12/19/2022: Normal sinus rhythm, rate 101, Q waves in leads III, aVF  Recent Labs: 06/08/2022: ALT 25; Pro B Natriuretic peptide (BNP) 14.0 10/21/2022: Magnesium 2.0 11/24/2022: B Natriuretic Peptide 19.7; Hemoglobin 14.6; Platelets 296; TSH 2.256 12/19/2022: BUN 27; Creatinine, Ser 1.08; Potassium 4.8; Sodium 145  Recent Lipid Panel    Component Value Date/Time   CHOL 204 (H) 01/25/2022 1524   TRIG 584.0 (H) 01/25/2022 1524   TRIG 276 (HH) 09/05/2006 0835   HDL 47.70 01/25/2022 1524   CHOLHDL 4 01/25/2022 1524   VLDL 70.8 (H) 03/18/2021 1431   LDLCALC 56 02/19/2013 1624   LDLDIRECT 82.0 01/25/2022 1524    Physical Exam:    VS:  BP (!) 149/92   Pulse (!) 101   Ht '5\' 10"'$  (1.778 m)   Wt 247 lb 9.6 oz (112.3 kg)   SpO2 92%   BMI 35.53 kg/m     Wt Readings from Last 3 Encounters:  12/19/22 247 lb 9.6 oz (112.3 kg)  11/29/22 245 lb 2 oz (111.2 kg)  11/24/22 249 lb 6 oz (113.1 kg)     GEN:  Well nourished, well developed in no acute distress HEENT: Normal NECK: No JVD; No carotid bruits LYMPHATICS: No lymphadenopathy CARDIAC: RRR, no murmurs, rubs, gallops RESPIRATORY:  Clear to auscultation without rales, wheezing or rhonchi  ABDOMEN: Soft, non-tender, non-distended MUSCULOSKELETAL:  No edema; No deformity  SKIN: Warm and dry NEUROLOGIC:  Alert and oriented x 3 PSYCHIATRIC:  Normal affect   ASSESSMENT:    1. Pre-op evaluation   2. Aortic dilatation (HCC)   3. Coronary artery disease involving native coronary artery of native heart without angina pectoris    4. Essential hypertension   5. Hyperlipidemia, unspecified hyperlipidemia type    PLAN:    Preop evaluation: prior to left hip surgery scheduled for 12/27/22. Echocardiogram 04/2022 showed EF 50 to 55%, inferior basal hypokinesis, mild RV dysfunction, ascending aorta measuring 44 mm.  He reports functional capacity greater than 4 METS, denies any exertional symptoms.  RCRI score 0.  No further cardiac workup recommended prior to procedure  Aortic dilatation: Ascending aorta measured 44 mm on echocardiogram 04/2022.  Recommend CTA chest to evaluate aorta  Nonobstructive CAD: Cath in 2011  showed minimal CAD.  Continue statin  Hyperlipidemia: On atorvastatin 40 mg daily.  LDL 82 on 01/25/22  Hypertension: On enalapril 5 mg daily.  BP elevated in clinic today but reports he did not take his medication.  Asked to check BP twice daily for next week and let us know results  Lower extremity edema: Takes Lasix 20 mg daily.  Mild edema on exam today   RTC in 3 months  Medication Adjustments/Labs and Tests Ordered: Current medicines are reviewed at length with the patient today.  Concerns regarding medicines are outlined above.  Orders Placed This Encounter  Procedures   CT ANGIO CHEST AORTA W/CM & OR WO/CM   Basic metabolic panel   EKG [id number]   No orders of the defined types were placed in this encounter.   Patient Instructions  Medication Instructions:  Your physician recommends that you continue on your current medications as directed. Please refer to the Current Medication list given to you today.  *If you need a refill on your cardiac medications before your next appointment, please call your pharmacy*  Testing/Procedures: CTA chest/aorta  Follow-Up: At [facility], you and your health needs are our priority.  As part of our continuing mission to provide you with exceptional heart care, we have created designated Provider Care Teams.  These Care Teams include your primary  Cardiologist (physician) and Advanced Practice Providers (APPs -  Physician Assistants and Nurse Practitioners) who all work together to provide you with the care you need, when you need it.  We recommend signing up for the patient portal called "MyChart".  Sign up information is provided on this After Visit Summary.  MyChart is used to connect with patients for Virtual Visits (Telemedicine).  Patients are able to view lab/test results, encounter notes, upcoming appointments, etc.  Non-urgent messages can be sent to your provider as well.   To learn more about what you can do with MyChart, go to [email or web address].    Your next appointment:   3 month(s)  Provider:   Dr. Gardiner Rhyme Other Instructions Please check your blood pressure at home twice daily, write it down.  Call the office or send message via Mychart with the readings in 1 week for Dr. Gardiner Rhyme to review.      Signed, Donato Heinz, MD  12/21/2022 10:27 AM    [place]

## 2022-12-19 ENCOUNTER — Encounter: Payer: Self-pay | Admitting: Cardiology

## 2022-12-19 ENCOUNTER — Ambulatory Visit: Payer: Medicare Other | Attending: Cardiology | Admitting: Cardiology

## 2022-12-19 VITALS — BP 149/92 | HR 101 | Ht 70.0 in | Wt 247.6 lb

## 2022-12-19 DIAGNOSIS — I10 Essential (primary) hypertension: Secondary | ICD-10-CM | POA: Insufficient documentation

## 2022-12-19 DIAGNOSIS — Z01818 Encounter for other preprocedural examination: Secondary | ICD-10-CM | POA: Insufficient documentation

## 2022-12-19 DIAGNOSIS — I77819 Aortic ectasia, unspecified site: Secondary | ICD-10-CM | POA: Insufficient documentation

## 2022-12-19 DIAGNOSIS — E785 Hyperlipidemia, unspecified: Secondary | ICD-10-CM | POA: Insufficient documentation

## 2022-12-19 DIAGNOSIS — I2510 Atherosclerotic heart disease of native coronary artery without angina pectoris: Secondary | ICD-10-CM | POA: Insufficient documentation

## 2022-12-19 NOTE — Patient Instructions (Signed)
Medication Instructions:  Your physician recommends that you continue on your current medications as directed. Please refer to the Current Medication list given to you today.  *If you need a refill on your cardiac medications before your next appointment, please call your pharmacy*  Testing/Procedures: CTA chest/aorta  Follow-Up: At [facility], you and your health needs are our priority.  As part of our continuing mission to provide you with exceptional heart care, we have created designated Provider Care Teams.  These Care Teams include your primary Cardiologist (physician) and Advanced Practice Providers (APPs -  Physician Assistants and Nurse Practitioners) who all work together to provide you with the care you need, when you need it.  We recommend signing up for the patient portal called "MyChart".  Sign up information is provided on this After Visit Summary.  MyChart is used to connect with patients for Virtual Visits (Telemedicine).  Patients are able to view lab/test results, encounter notes, upcoming appointments, etc.  Non-urgent messages can be sent to your provider as well.   To learn more about what you can do with MyChart, go to [email or web address].    Your next appointment:   3 month(s)  Provider:   Dr. Gardiner Rhyme Other Instructions Please check your blood pressure at home twice daily, write it down.  Call the office or send message via Mychart with the readings in 1 week for Dr. Gardiner Rhyme to review.

## 2022-12-20 ENCOUNTER — Other Ambulatory Visit: Payer: Self-pay | Admitting: *Deleted

## 2022-12-20 DIAGNOSIS — E870 Hyperosmolality and hypernatremia: Secondary | ICD-10-CM

## 2022-12-20 DIAGNOSIS — I10 Essential (primary) hypertension: Secondary | ICD-10-CM

## 2022-12-20 LAB — BASIC METABOLIC PANEL
BUN/Creatinine Ratio: 25 — ABNORMAL HIGH (ref 10–24)
BUN: 27 mg/dL (ref 8–27)
CO2: 26 mmol/L (ref 20–29)
Calcium: 9.7 mg/dL (ref 8.6–10.2)
Chloride: 103 mmol/L (ref 96–106)
Creatinine, Ser: 1.08 mg/dL (ref 0.76–1.27)
Glucose: 100 mg/dL — ABNORMAL HIGH (ref 70–99)
Potassium: 4.8 mmol/L (ref 3.5–5.2)
Sodium: 145 mmol/L — ABNORMAL HIGH (ref 134–144)
eGFR: 70 mL/min/{1.73_m2} (ref >59)

## 2022-12-20 NOTE — Progress Notes (Signed)
COVID Vaccine received:  '[]'$  No '[x]'$  Yes Date of any COVID positive Test in last 90 days:  PCP - Kathlene November, MD Cardiologist - Oswaldo Milian, MD Clearance in Epic note 12-19-2022.  Chest x-ray -  EKG -  12-19-2022  epic Stress Test -  ECHO - [id number]  epic Cardiac Cath - 2011 per Dr. Gardiner Rhyme minimal nonobstructive CAD.   PCR screen: '[x]'$  Ordered & Completed                      '[]'$   No Order but Needs PROFEND                      '[]'$   N/A for this surgery  Surgery Plan:  '[x]'$  Ambulatory                            '[]'$  Outpatient in bed                            '[]'$  Admit  Anesthesia:    '[]'$  General  '[x]'$  Spinal                           '[]'$   Choice '[]'$   MAC  Pacemaker / ICD device '[x]'$  No '[]'$  Yes        Device order form faxed '[]'$  No    '[]'$   Yes      Faxed to:  Spinal Cord Stimulator:'[x]'$  No '[]'$  Yes      (Remind patient to bring remote DOS) Other Implants:   History of Sleep Apnea? '[x]'$  No '[]'$  Yes   CPAP used?- '[x]'$  No '[]'$  Yes    Does the patient monitor blood sugar? '[]'$  No '[]'$  Yes  '[x]'$  N/A  Patient has: '[]'$  Pre-DM   '[]'$  DM1  '[]'$   DM2 Does patient have a [employer] or Dexacom? '[]'$  No '[]'$  Yes   Fasting Blood Sugar Ranges-  Checks Blood Sugar _____ times a day  Blood Thinner / Instructions:None Aspirin Instructions: None  ERAS Protocol Ordered: '[]'$  No  '[x]'$  Yes PRE-SURGERY '[x]'$  ENSURE  '[]'$  G2  Patient is to be NPO after:  06:30 am  Comments:   Activity level: Patient can / can not climb a flight of stairs without difficulty; '[]'$  No CP  '[]'$  No SOB, but would have ______   Patient can / can not perform ADLs without assistance.   Anesthesia review: HTN, Non-obstructive CAD (Cath 2011), anxiety, Prostate ca, Tachycardia.  Patient denies shortness of breath, fever, cough and chest pain at PAT appointment.  Patient verbalized understanding and agreement to the Pre-Surgical Instructions that were given to them at this PAT appointment. Patient was also educated of the need to review these PAT  instructions again prior to his/her surgery.I reviewed the appropriate phone numbers to call if they have any and questions or concerns.

## 2022-12-20 NOTE — Patient Instructions (Signed)
SURGICAL WAITING ROOM VISITATION Patients having surgery or a procedure may have no more than 2 support people in the waiting area - these visitors may rotate in the visitor waiting room.   Due to an increase in RSV and influenza rates and associated hospitalizations, children ages 82 and under may not visit patients in [place]. If the patient needs to stay at the hospital during part of their recovery, the visitor guidelines for inpatient rooms apply.  PRE-OP VISITATION  Pre-op nurse will coordinate an appropriate time for 1 support person to accompany the patient in pre-op.  This support person may not rotate.  This visitor will be contacted when the time is appropriate for the visitor to come back in the pre-op area.  Please refer to the [facility] website for the visitor guidelines for Inpatients (after your surgery is over and you are in a regular room).  You are not required to quarantine at this time prior to your surgery. However, you must do this: Hand Hygiene often Do NOT share personal items Notify your provider if you are in close contact with someone who has COVID or you develop fever 100.4 or greater, new onset of sneezing, cough, sore throat, shortness of breath or body aches.  If you test positive for Covid or have been in contact with anyone that has tested positive in the last 10 days please notify you surgeon.    Your procedure is scheduled on:  Tuesday  December 27, 2022  Report to [facility] Main Entrance: [place] entrance where the [employer] is available.   Report to admitting at: 07:00  AM  +++++Call this number if you have any questions or problems the morning of surgery [phone]  Do not eat food after Midnight the night prior to your surgery/procedure.  After Midnight you may have the following liquids until  06:30  AM DAY OF SURGERY  Clear Liquid Diet Water Black Coffee (sugar ok, NO MILK/CREAM OR CREAMERS)  Tea (sugar ok, NO  MILK/CREAM OR CREAMERS) regular and decaf                             Plain Jell-O  with no fruit (NO RED)                                           Fruit ices (not with fruit pulp, NO RED)                                     Popsicles (NO RED)                                                                  Juice: apple, WHITE grape, WHITE cranberry Sports drinks like Gatorade or Powerade (NO RED)                    The day of surgery:  Drink ONE (1) Pre-Surgery Clear Ensure at 06:30   AM the morning of surgery. Drink in one sitting.  Do not sip.  This drink was given to you during your hospital pre-op appointment visit. Nothing else to drink after completing the Pre-Surgery Clear Ensure, No candy, chewing gum or throat lozenges.    FOLLOW  ANY ADDITIONAL PRE OP INSTRUCTIONS YOU RECEIVED FROM YOUR SURGEON'S OFFICE!!!   Oral Hygiene is also important to reduce your risk of infection.        Remember - BRUSH YOUR TEETH THE MORNING OF SURGERY WITH YOUR REGULAR TOOTHPASTE  Do NOT smoke after Midnight the night before surgery.  Take ONLY these medicines the morning of surgery with A SIP OF WATER: None????                     You may not have any metal on your body including jewelry, and body piercing  Do not wear  lotions, powders, cologne, or deodorant  Men may shave face and neck.  Contacts, Hearing Aids, dentures or bridgework may not be worn into surgery. DENTURES WILL BE REMOVED PRIOR TO SURGERY PLEASE DO NOT APPLY "Poly grip" OR ADHESIVES!!!  Patients discharged on the day of surgery will not be allowed to drive home.  Someone NEEDS to stay with you for the first 24 hours after anesthesia.  Do not bring your home medications to the hospital. The Pharmacy will dispense medications listed on your medication list to you during your admission in the Hospital.  Special Instructions: Bring a copy of your healthcare power of attorney and living will documents the day of surgery, if  you wish to have them scanned into your [facility] Records- EPIC  Please read over the following fact sheets you were given: IF YOU HAVE QUESTIONS ABOUT YOUR PRE-OP INSTRUCTIONS, PLEASE CALL [phone]  ([place])   [place] - Preparing for Surgery Before surgery, you can play an important role.  Because skin is not sterile, your skin needs to be as free of germs as possible.  You can reduce the number of germs on your skin by washing with CHG (chlorahexidine gluconate) soap before surgery.  CHG is an antiseptic cleaner which kills germs and bonds with the skin to continue killing germs even after washing. Please DO NOT use if you have an allergy to CHG or antibacterial soaps.  If your skin becomes reddened/irritated stop using the CHG and inform your nurse when you arrive at Short Stay. Do not shave (including legs and underarms) for at least 48 hours prior to the first CHG shower.  You may shave your face/neck.  Please follow these instructions carefully:  1.  Shower with CHG Soap the night before surgery and the  morning of surgery.  2.  If you choose to wash your hair, wash your hair first as usual with your normal  shampoo.  3.  After you shampoo, rinse your hair and body thoroughly to remove the shampoo.                             4.  Use CHG as you would any other liquid soap.  You can apply chg directly to the skin and wash.  Gently with a scrungie or clean washcloth.  5.  Apply the CHG Soap to your body ONLY FROM THE NECK DOWN.   Do not use on face/ open  Wound or open sores. Avoid contact with eyes, ears mouth and genitals (private parts).                       Wash face,  Genitals (private parts) with your normal soap.             6.  Wash thoroughly, paying special attention to the area where your  surgery  will be performed.  7.  Thoroughly rinse your body with warm water from the neck down.  8.  DO NOT shower/wash with your normal soap after using  and rinsing off the CHG Soap.            9.  Pat yourself dry with a clean towel.            10.  Wear clean pajamas.            11.  Place clean sheets on your bed the night of your first shower and do not  sleep with pets.  ON THE DAY OF SURGERY : Do not apply any lotions/deodorants the morning of surgery.  Please wear clean clothes to the hospital/surgery center.    FAILURE TO FOLLOW THESE INSTRUCTIONS MAY RESULT IN THE CANCELLATION OF YOUR SURGERY  PATIENT SIGNATURE_________________________________  NURSE SIGNATURE__________________________________  ________________________________________________________________________        Adam Phenix    An incentive spirometer is a tool that can help keep your lungs clear and active. This tool measures how well you are filling your lungs with each breath. Taking long deep breaths may help reverse or decrease the chance of developing breathing (pulmonary) problems (especially infection) following: A long period of time when you are unable to move or be active. BEFORE THE PROCEDURE  If the spirometer includes an indicator to show your best effort, your nurse or respiratory therapist will set it to a desired goal. If possible, sit up straight or lean slightly forward. Try not to slouch. Hold the incentive spirometer in an upright position. INSTRUCTIONS FOR USE  Sit on the edge of your bed if possible, or sit up as far as you can in bed or on a chair. Hold the incentive spirometer in an upright position. Breathe out normally. Place the mouthpiece in your mouth and seal your lips tightly around it. Breathe in slowly and as deeply as possible, raising the piston or the ball toward the top of the column. Hold your breath for 3-5 seconds or for as long as possible. Allow the piston or ball to fall to the bottom of the column. Remove the mouthpiece from your mouth and breathe out normally. Rest for a few seconds and repeat Steps 1  through 7 at least 10 times every 1-2 hours when you are awake. Take your time and take a few normal breaths between deep breaths. The spirometer may include an indicator to show your best effort. Use the indicator as a goal to work toward during each repetition. After each set of 10 deep breaths, practice coughing to be sure your lungs are clear. If you have an incision (the cut made at the time of surgery), support your incision when coughing by placing a pillow or rolled up towels firmly against it. Once you are able to get out of bed, walk around indoors and cough well. You may stop using the incentive spirometer when instructed by your caregiver.  RISKS AND COMPLICATIONS Take your time so you do not get dizzy or light-headed. If you  are in pain, you may need to take or ask for pain medication before doing incentive spirometry. It is harder to take a deep breath if you are having pain. AFTER USE Rest and breathe slowly and easily. It can be helpful to keep track of a log of your progress. Your caregiver can provide you with a simple table to help with this. If you are using the spirometer at home, follow these instructions: [place] IF:  You are having difficultly using the spirometer. You have trouble using the spirometer as often as instructed. Your pain medication is not giving enough relief while using the spirometer. You develop fever of 100.5 F (38.1 C) or higher.                                                                                                    SEEK IMMEDIATE MEDICAL CARE IF:  You cough up bloody sputum that had not been present before. You develop fever of 102 F (38.9 C) or greater. You develop worsening pain at or near the incision site. MAKE SURE YOU:  Understand these instructions. Will watch your condition. Will get help right away if you are not doing well or get worse. Document Released: 02/20/2007 Document Revised: 01/02/2012 Document Reviewed:  04/23/2007 [facility] Patient Information 2014 [place], [place].     WHAT IS A BLOOD TRANSFUSION? Blood Transfusion Information  A transfusion is the replacement of blood or some of its parts. Blood is made up of multiple cells which provide different functions. Red blood cells carry oxygen and are used for blood loss replacement. White blood cells fight against infection. Platelets control bleeding. Plasma helps clot blood. Other blood products are available for specialized needs, such as hemophilia or other clotting disorders. BEFORE THE TRANSFUSION  Who gives blood for transfusions?  Healthy volunteers who are fully evaluated to make sure their blood is safe. This is blood bank blood. Transfusion therapy is the safest it has ever been in the practice of medicine. Before blood is taken from a donor, a complete history is taken to make sure that person has no history of diseases nor engages in risky social behavior (examples are intravenous drug use or sexual activity with multiple partners). The donor's travel history is screened to minimize risk of transmitting infections, such as malaria. The donated blood is tested for signs of infectious diseases, such as HIV and hepatitis. The blood is then tested to be sure it is compatible with you in order to minimize the chance of a transfusion reaction. If you or a relative donates blood, this is often done in anticipation of surgery and is not appropriate for emergency situations. It takes many days to process the donated blood. RISKS AND COMPLICATIONS Although transfusion therapy is very safe and saves many lives, the main dangers of transfusion include:  Getting an infectious disease. Developing a transfusion reaction. This is an allergic reaction to something in the blood you were given. Every precaution is taken to prevent this. The decision to have a blood transfusion has been considered carefully by your caregiver before  blood is given. Blood is  not given unless the benefits outweigh the risks. AFTER THE TRANSFUSION Right after receiving a blood transfusion, you will usually feel much better and more energetic. This is especially true if your red blood cells have gotten low (anemic). The transfusion raises the level of the red blood cells which carry oxygen, and this usually causes an energy increase. The nurse administering the transfusion will monitor you carefully for complications. HOME CARE INSTRUCTIONS  No special instructions are needed after a transfusion. You may find your energy is better. Speak with your caregiver about any limitations on activity for underlying diseases you may have. SEEK MEDICAL CARE IF:  Your condition is not improving after your transfusion. You develop redness or irritation at the intravenous (IV) site. SEEK IMMEDIATE MEDICAL CARE IF:  Any of the following symptoms occur over the next 12 hours: Shaking chills. You have a temperature by mouth above 102 F (38.9 C), not controlled by medicine. Chest, back, or muscle pain. People around you feel you are not acting correctly or are confused. Shortness of breath or difficulty breathing. Dizziness and fainting. You get a rash or develop hives. You have a decrease in urine output. Your urine turns a dark color or changes to pink, red, or brown. Any of the following symptoms occur over the next 10 days: You have a temperature by mouth above 102 F (38.9 C), not controlled by medicine. Shortness of breath. Weakness after normal activity. The white part of the eye turns yellow (jaundice). You have a decrease in the amount of urine or are urinating less often. Your urine turns a dark color or changes to pink, red, or brown. Document Released: 10/07/2000 Document Revised: 01/02/2012 Document Reviewed: 05/26/2008 [facility] Patient Information 2014 [place], [place].  _______________________________________________________________________

## 2022-12-21 ENCOUNTER — Telehealth: Payer: Self-pay | Admitting: Cardiology

## 2022-12-21 NOTE — Telephone Encounter (Signed)
Spoke to wife (ok per DPR)-aware of labs repeated at time of procedure (3/5) no need for additional labs.

## 2022-12-21 NOTE — Telephone Encounter (Signed)
Left message to call back  

## 2022-12-21 NOTE — Telephone Encounter (Signed)
Wife was returning call. Please advise ?

## 2022-12-22 ENCOUNTER — Encounter (HOSPITAL_COMMUNITY)
Admission: RE | Admit: 2022-12-22 | Discharge: 2022-12-22 | Disposition: A | Discharge status: Home / Self Care | Payer: Medicare Other | Source: Ambulatory Visit | Attending: Internal Medicine | Admitting: Internal Medicine

## 2022-12-22 ENCOUNTER — Encounter (HOSPITAL_COMMUNITY): Payer: Self-pay

## 2022-12-22 DIAGNOSIS — I2510 Atherosclerotic heart disease of native coronary artery without angina pectoris: Secondary | ICD-10-CM

## 2022-12-22 DIAGNOSIS — Z01818 Encounter for other preprocedural examination: Secondary | ICD-10-CM

## 2022-12-23 NOTE — Patient Instructions (Signed)
DUE TO COVID-19 ONLY TWO VISITORS  (aged 79 and older)  ARE ALLOWED TO COME WITH YOU AND STAY IN THE WAITING ROOM ONLY DURING PRE OP AND PROCEDURE.   **NO VISITORS ARE ALLOWED IN THE SHORT STAY AREA OR RECOVERY ROOM!!**  IF YOU WILL BE ADMITTED INTO THE HOSPITAL YOU ARE ALLOWED ONLY FOUR SUPPORT PEOPLE DURING VISITATION HOURS ONLY (7 AM -8PM)   The support person(s) must pass our screening, gel in and out, and wear a mask at all times, including in the patient's room. Patients must also wear a mask when staff or their support person are in the room. Visitors GUEST BADGE MUST BE WORN VISIBLY  One adult visitor may remain with you overnight and MUST be in the room by 8 P.M.     Your procedure is scheduled on: 12/27/22   Report to [facility] Main Entrance    Report to admitting at   7:00 AM   Call this number if you have problems the morning of surgery [phone]   Do not eat food :After Midnight.   After Midnight you may have the following liquids until 6:30__ AM/  DAY OF SURGERY  Water Black Coffee (sugar ok, NO MILK/CREAM OR CREAMERS)  Tea (sugar ok, NO MILK/CREAM OR CREAMERS) regular and decaf                             Plain Jell-O (NO RED)                                           Fruit ices (not with fruit pulp, NO RED)                                     Popsicles (NO RED)                                                                  Juice: apple, WHITE grape, WHITE cranberry Sports drinks like Gatorade (NO RED)     The day of surgery:  Drink ONE (1) Pre-Surgery Clear Ensure  at 6:15 AM the morning of surgery. Drink in one sitting. Do not sip.  This drink was given to you during your hospital  pre-op appointment visit. Nothing else to drink after completing the  Pre-Surgery Clear Ensure at 6:30 am          If you have questions, please contact your surgeon's office.      Oral Hygiene is also important to reduce your risk of infection.                                     Remember - BRUSH YOUR TEETH THE MORNING OF SURGERY WITH YOUR REGULAR TOOTHPASTE  DENTURES WILL BE REMOVED PRIOR TO SURGERY PLEASE DO NOT APPLY "Poly grip" OR ADHESIVES!!!   Do NOT smoke after Midnight   Take these medicines the morning of surgery with A SIP OF WATER: none  Bring CPAP mask and tubing day of surgery.                              You may not have any metal on your body including  jewelry, and body piercing             Do not wear lotions, powders, perfumes/cologne, or deodorant               Men may shave face and neck.   Do not bring valuables to the hospital. [place].   Contacts, glasses, or bridgework may not be worn into surgery.   DO NOT [place].     Patients discharged on the day of surgery will not be allowed to drive home.  Someone NEEDS to stay with you for the first 24 hours after anesthesia.   Special Instructions: Bring a copy of your healthcare power of attorney and living will documents the day of surgery if you haven't scanned them before.              Please read over the following fact sheets you were given: IF YOU HAVE QUESTIONS ABOUT YOUR PRE-OP INSTRUCTIONS PLEASE CALL [phone]    [facility] - Preparing for Surgery Before surgery, you can play an important role.  Because skin is not sterile, your skin needs to be as free of germs as possible.  You can reduce the number of germs on your skin by washing with CHG (chlorahexidine gluconate) soap before surgery.  CHG is an antiseptic cleaner which kills germs and bonds with the skin to continue killing germs even after washing. Please DO NOT use if you have an allergy to CHG or antibacterial soaps.  If your skin becomes reddened/irritated stop using the CHG and inform your nurse when you arrive at Short Stay.  You may shave your face/neck. Please follow these instructions carefully:  1.  Shower  with CHG Soap the night before surgery and the  morning of Surgery.  2.  If you choose to wash your hair, wash your hair first as usual with your  normal  shampoo.  3.  After you shampoo, rinse your hair and body thoroughly to remove the  shampoo.                            4.  Use CHG as you would any other liquid soap.  You can apply chg directly  to the skin and wash                       Gently with a scrungie or clean washcloth.  5.  Apply the CHG Soap to your body ONLY FROM THE NECK DOWN.   Do not use on face/ open                           Wound or open sores. Avoid contact with eyes, ears mouth and genitals (private parts).                       Wash face,  Genitals (private parts) with your normal soap.  6.  Wash thoroughly, paying special attention to the area where your surgery  will be performed.  7.  Thoroughly rinse your body with warm water from the neck down.  8.  DO NOT shower/wash with your normal soap after using and rinsing off  the CHG Soap.             9.  Pat yourself dry with a clean towel.            10.  Wear clean pajamas.            11.  Place clean sheets on your bed the night of your first shower and do not  sleep with pets. Day of Surgery : Do not apply any lotions/deodorants the morning of surgery.  Please wear clean clothes to the hospital/surgery center.  FAILURE TO FOLLOW THESE INSTRUCTIONS MAY RESULT IN THE CANCELLATION OF YOUR SURGERY  PATIENT SIGNATURE_________________________________   ________________________________________________________________________  Adam Phenix  An incentive spirometer is a tool that can help keep your lungs clear and active. This tool measures how well you are filling your lungs with each breath. Taking long deep breaths may help reverse or decrease the chance of developing breathing (pulmonary) problems (especially infection) following: A long period of time when you are unable to move or be  active. BEFORE THE PROCEDURE  If the spirometer includes an indicator to show your best effort, your nurse or respiratory therapist will set it to a desired goal. If possible, sit up straight or lean slightly forward. Try not to slouch. Hold the incentive spirometer in an upright position. INSTRUCTIONS FOR USE  Sit on the edge of your bed if possible, or sit up as far as you can in bed or on a chair. Hold the incentive spirometer in an upright position. Breathe out normally. Place the mouthpiece in your mouth and seal your lips tightly around it. Breathe in slowly and as deeply as possible, raising the piston or the ball toward the top of the column. Hold your breath for 3-5 seconds or for as long as possible. Allow the piston or ball to fall to the bottom of the column. Remove the mouthpiece from your mouth and breathe out normally. Rest for a few seconds and repeat Steps 1 through 7 at least 10 times every 1-2 hours when you are awake. Take your time and take a few normal breaths between deep breaths. The spirometer may include an indicator to show your best effort. Use the indicator as a goal to work toward during each repetition. After each set of 10 deep breaths, practice coughing to be sure your lungs are clear. If you have an incision (the cut made at the time of surgery), support your incision when coughing by placing a pillow or rolled up towels firmly against it. Once you are able to get out of bed, walk around indoors and cough well. You may stop using the incentive spirometer when instructed by your caregiver.  RISKS AND COMPLICATIONS Take your time so you do not get dizzy or light-headed. If you are in pain, you may need to take or ask for pain medication before doing incentive spirometry. It is harder to take a deep breath if you are having pain. AFTER USE Rest and breathe slowly and easily. It can be helpful to keep track of a log of your progress. Your caregiver can provide you  with a simple table to help with this. If you are using the spirometer at home,  follow these instructions: [place] IF:  You are having difficultly using the spirometer. You have trouble using the spirometer as often as instructed. Your pain medication is not giving enough relief while using the spirometer. You develop fever of 100.5 F (38.1 C) or higher. SEEK IMMEDIATE MEDICAL CARE IF:  You cough up bloody sputum that had not been present before. You develop fever of 102 F (38.9 C) or greater. You develop worsening pain at or near the incision site. MAKE SURE YOU:  Understand these instructions. Will watch your condition. Will get help right away if you are not doing well or get worse. Document Released: 02/20/2007 Document Revised: 01/02/2012 Document Reviewed: 04/23/2007 [facility] Patient Information 2014 [place], [place].   ________________________________________________________________________

## 2022-12-23 NOTE — H&P (Signed)
TOTAL HIP ADMISSION H&P  Patient is admitted for left total hip arthroplasty.  Subjective:  Chief Complaint: left hip pain  HPI: Larry Elliott, 77 y.o. male, has a history of pain and functional disability in the left hip(s) due to arthritis and patient has failed non-surgical conservative treatments for greater than 12 weeks to include NSAID's and/or analgesics, corticosteriod injections, flexibility and strengthening excercises, use of assistive devices, weight reduction as appropriate, and activity modification.  Onset of symptoms was gradual starting 5 years ago with gradually worsening course since that time.The patient noted no past surgery on the left hip(s).  Patient currently rates pain in the left hip at 10 out of 10 with activity. Patient has night pain, worsening of pain with activity and weight bearing, trendelenberg gait, pain that interfers with activities of daily living, and crepitus. Patient has evidence of subchondral cysts, subchondral sclerosis, periarticular osteophytes, and joint space narrowing by imaging studies. This condition presents safety issues increasing the risk of falls. There is no current active infection.  Patient Active Problem List   Diagnosis Date Noted   AMS (altered mental status) 05/16/2022   Encephalopathy acute    Radiculopathy 12/04/2019   Low back pain 08/03/2018   PCP NOTES >>>>> 09/06/2015   LLQ abdominal pain 07/16/2014   Anxiety and depression 11/04/2013   Pain in joint, ankle and foot 12/14/2012   Annual physical exam 03/11/2011   DJD (degenerative joint disease) 03/17/2010   MELANOMA, FACE 11/20/2007   History of malignant neoplasm of prostate 08/05/2007   Hyperlipidemia 02/21/2007   Essential hypertension 02/21/2007   DIVERTICULOSIS, COLON 02/21/2007   Insomnia 02/21/2007   Past Medical History:  Diagnosis Date   Anxiety and depression 11/04/2013   Arthritis    BCC (basal cell carcinoma), leg    Right calf, L nape of neck at  hairline   CAD (coronary artery disease)    CP, cath, non-obstructive   Diverticulosis    colon   Erectile dysfunction following radical prostatectomy    Hyperlipidemia    Hypertension    Insomnia    Melanoma ([place]) 2010   face, sees derm    Prostate CA ([place]) 10/08   s/p robotic surgery   Spermatocele    Left    Past Surgical History:  Procedure Laterality Date   APPENDECTOMY     HERNIA REPAIR     KNEE ARTHROSCOPY Left    meniscal tear   PROSTATECTOMY  07-2007    robotic,  for prostate cancer     SHOULDER ARTHROSCOPY  07-09-2013   Dr Tamera Punt    TRANSFORAMINAL LUMBAR INTERBODY FUSION (TLIF) WITH PEDICLE SCREW FIXATION 1 LEVEL Right 12/04/2019   Procedure: RIGHT-SIDED TRANSFORAMINAL LUMBAR INTERBODY FUSION LUMBAR FOUR THROUGH FIVE WITH INSTRUMENTATION AND ALLOGRAFT;  Surgeon: Phylliss Bob, MD;  Location: [facility];  Service: Orthopedics;  Laterality: Right;    No current facility-administered medications for this encounter.   Current Outpatient Medications  Medication Sig Dispense Refill Last Dose   atorvastatin (LIPITOR) 40 MG tablet Take 1 tablet (40 mg total) by mouth at bedtime. 90 tablet 1    enalapril (VASOTEC) 5 MG tablet Take 1 tablet (5 mg total) by mouth daily. 90 tablet 1    fenofibrate micronized (LOFIBRA) 134 MG capsule Take 1 capsule (134 mg total) by mouth daily before breakfast. 90 capsule 1    furosemide (LASIX) 20 MG tablet Take 1 tablet (20 mg total) by mouth daily. 90 tablet 1    traZODone (DESYREL) 100  MG tablet Take 100 mg by mouth at bedtime.      clorazepate (TRANXENE) 7.5 MG tablet Take 1 tablet by mouth at bedtime as needed for sleep (Patient not taking: Reported on 12/20/2022) 30 tablet 2 Not Taking   fluticasone (FLONASE) 50 MCG/ACT nasal spray Place 2 sprays into both nostrils daily. (Patient not taking: Reported on 12/20/2022) 16 g 1 Not Taking   Allergies  Allergen Reactions   Ambien [Zolpidem]     Encephalopathy, admitted 04-2022, symptoms suspected  to be from [place]   Aspirin Other (See Comments)    REACTION: swelling in face   Hydrocodone     Insomnia, mild SOB (w/o cough-wheezing)   Penicillins Other (See Comments)    REACTION: swelling in face  Did it involve swelling of the face/tongue/throat, SOB, or low BP? Yes Did it involve sudden or severe rash/hives, skin peeling, or any reaction on the inside of your mouth or nose? No Did you need to seek medical attention at a hospital or doctor's office? Yes When did it last happen?      [id number] played Hockey If all above answers are "NO", may proceed with cephalosporin use.    Social History   Tobacco Use   Smoking status: Never   Smokeless tobacco: Never  Substance Use Topics   Alcohol use: Yes    Alcohol/week: 7.0 standard drinks of alcohol    Types: 7 Glasses of wine per week    Comment: socially; 1 glass of wine/day    Family History  Problem Relation Age of Onset   Coronary artery disease Father 70       dx age 35s   Stroke Mother    Hypertension Neg Hx    Diabetes Neg Hx    Colon cancer Neg Hx    Prostate cancer Neg Hx      Review of Systems  Musculoskeletal:  Positive for arthralgias.       Left hip  All other systems reviewed and are negative.   Objective:  Physical Exam Constitutional:      Appearance: Normal appearance.  HENT:     Head: Normocephalic and atraumatic.     Nose: Nose normal.     Mouth/Throat:     Pharynx: Oropharynx is clear.  Eyes:     Extraocular Movements: Extraocular movements intact.  Pulmonary:     Effort: Pulmonary effort is normal.  Abdominal:     Palpations: Abdomen is soft.  Musculoskeletal:     Cervical back: Normal range of motion.     Comments: Examination of the left hip shows significantly limited range of motion with pain with any internal rotation.  He has some mild tennis palpation laterally greater trochanteric bursal region.  Is otherwise nontender to palpation throughout.  Right hip has fairly good range of  motion with some pain at end range of motion.  Some mild tenderness palpation laterally.  He walks with an altered gait.  He is neurovascularly intact distally.    Skin:    General: Skin is warm and dry.  Neurological:     General: No focal deficit present.     Mental Status: He is alert and oriented to person, place, and time. Mental status is at baseline.  Psychiatric:        Mood and Affect: Mood normal.        Behavior: Behavior normal.        Thought Content: Thought content normal.        Judgment:  Judgment normal.     Vital signs in last 24 hours:    Labs:   Estimated body mass index is 35.53 kg/m as calculated from the following:   Height as of 12/19/22: '5\' 10"'$  (1.778 m).   Weight as of 12/19/22: 112.3 kg.   Imaging Review Plain radiographs demonstrate severe degenerative joint disease of the left hip(s). The bone quality appears to be good for age and reported activity level.      Assessment/Plan:  End stage primary arthritis, left hip(s)  The patient history, physical examination, clinical judgement of the provider and imaging studies are consistent with end stage degenerative joint disease of the left hip(s) and total hip arthroplasty is deemed medically necessary. The treatment options including medical management, injection therapy, arthroscopy and arthroplasty were discussed at length. The risks and benefits of total hip arthroplasty were presented and reviewed. The risks due to aseptic loosening, infection, stiffness, dislocation/subluxation,  thromboembolic complications and other imponderables were discussed.  The patient acknowledged the explanation, agreed to proceed with the plan and consent was signed. Patient is being admitted for inpatient treatment for surgery, pain control, PT, OT, prophylactic antibiotics, VTE prophylaxis, progressive ambulation and ADL's and discharge planning.The patient is planning to be discharged home with home health  services  Patient's anticipated LOS is less than 2 midnights, meeting these requirements: - Younger than 71 - Lives within 1 hour of care - Has a competent adult at home to recover with post-op recover - NO history of  - Chronic pain requiring opiods  - Diabetes  - Coronary Artery Disease  - Heart failure  - Heart attack  - Stroke  - DVT/VTE  - Cardiac arrhythmia  - Respiratory Failure/COPD  - Renal failure  - Anemia  - Advanced Liver disease

## 2022-12-26 ENCOUNTER — Encounter (HOSPITAL_COMMUNITY): Payer: Self-pay

## 2022-12-26 ENCOUNTER — Other Ambulatory Visit: Payer: Self-pay

## 2022-12-26 ENCOUNTER — Encounter (HOSPITAL_COMMUNITY)
Admission: RE | Admit: 2022-12-26 | Discharge: 2022-12-26 | Disposition: A | Discharge status: Home / Self Care | Payer: Medicare Other | Source: Ambulatory Visit | Attending: Orthopaedic Surgery | Admitting: Orthopaedic Surgery

## 2022-12-26 DIAGNOSIS — F418 Other specified anxiety disorders: Secondary | ICD-10-CM | POA: Diagnosis not present

## 2022-12-26 DIAGNOSIS — R55 Syncope and collapse: Secondary | ICD-10-CM | POA: Diagnosis not present

## 2022-12-26 DIAGNOSIS — Z886 Allergy status to analgesic agent status: Secondary | ICD-10-CM | POA: Diagnosis not present

## 2022-12-26 DIAGNOSIS — I2510 Atherosclerotic heart disease of native coronary artery without angina pectoris: Secondary | ICD-10-CM | POA: Diagnosis not present

## 2022-12-26 DIAGNOSIS — E861 Hypovolemia: Secondary | ICD-10-CM | POA: Diagnosis present

## 2022-12-26 DIAGNOSIS — I10 Essential (primary) hypertension: Secondary | ICD-10-CM | POA: Diagnosis not present

## 2022-12-26 DIAGNOSIS — E872 Acidosis, unspecified: Secondary | ICD-10-CM | POA: Diagnosis not present

## 2022-12-26 DIAGNOSIS — T502X5A Adverse effect of carbonic-anhydrase inhibitors, benzothiadiazides and other diuretics, initial encounter: Secondary | ICD-10-CM | POA: Diagnosis present

## 2022-12-26 DIAGNOSIS — G4700 Insomnia, unspecified: Secondary | ICD-10-CM | POA: Diagnosis present

## 2022-12-26 DIAGNOSIS — Z888 Allergy status to other drugs, medicaments and biological substances status: Secondary | ICD-10-CM | POA: Diagnosis not present

## 2022-12-26 DIAGNOSIS — T428X5A Adverse effect of antiparkinsonism drugs and other central muscle-tone depressants, initial encounter: Secondary | ICD-10-CM | POA: Diagnosis not present

## 2022-12-26 DIAGNOSIS — E871 Hypo-osmolality and hyponatremia: Secondary | ICD-10-CM | POA: Diagnosis not present

## 2022-12-26 DIAGNOSIS — Z01812 Encounter for preprocedural laboratory examination: Secondary | ICD-10-CM | POA: Insufficient documentation

## 2022-12-26 DIAGNOSIS — Z85820 Personal history of malignant melanoma of skin: Secondary | ICD-10-CM | POA: Diagnosis not present

## 2022-12-26 DIAGNOSIS — Z96642 Presence of left artificial hip joint: Secondary | ICD-10-CM | POA: Diagnosis not present

## 2022-12-26 DIAGNOSIS — E860 Dehydration: Secondary | ICD-10-CM | POA: Diagnosis not present

## 2022-12-26 DIAGNOSIS — Z471 Aftercare following joint replacement surgery: Secondary | ICD-10-CM | POA: Diagnosis not present

## 2022-12-26 DIAGNOSIS — M1612 Unilateral primary osteoarthritis, left hip: Secondary | ICD-10-CM | POA: Diagnosis not present

## 2022-12-26 DIAGNOSIS — G929 Unspecified toxic encephalopathy: Secondary | ICD-10-CM | POA: Diagnosis not present

## 2022-12-26 DIAGNOSIS — R6510 Systemic inflammatory response syndrome (SIRS) of non-infectious origin without acute organ dysfunction: Secondary | ICD-10-CM | POA: Diagnosis not present

## 2022-12-26 DIAGNOSIS — K5900 Constipation, unspecified: Secondary | ICD-10-CM | POA: Diagnosis not present

## 2022-12-26 DIAGNOSIS — N179 Acute kidney failure, unspecified: Secondary | ICD-10-CM | POA: Diagnosis not present

## 2022-12-26 DIAGNOSIS — Z6835 Body mass index (BMI) 35.0-35.9, adult: Secondary | ICD-10-CM | POA: Diagnosis not present

## 2022-12-26 DIAGNOSIS — E785 Hyperlipidemia, unspecified: Secondary | ICD-10-CM | POA: Diagnosis not present

## 2022-12-26 DIAGNOSIS — Z01818 Encounter for other preprocedural examination: Secondary | ICD-10-CM

## 2022-12-26 DIAGNOSIS — Z8546 Personal history of malignant neoplasm of prostate: Secondary | ICD-10-CM | POA: Diagnosis not present

## 2022-12-26 DIAGNOSIS — E876 Hypokalemia: Secondary | ICD-10-CM | POA: Diagnosis not present

## 2022-12-26 DIAGNOSIS — I959 Hypotension, unspecified: Secondary | ICD-10-CM | POA: Diagnosis not present

## 2022-12-26 DIAGNOSIS — I952 Hypotension due to drugs: Secondary | ICD-10-CM | POA: Diagnosis not present

## 2022-12-26 DIAGNOSIS — Z85828 Personal history of other malignant neoplasm of skin: Secondary | ICD-10-CM | POA: Diagnosis not present

## 2022-12-26 DIAGNOSIS — Z66 Do not resuscitate: Secondary | ICD-10-CM | POA: Diagnosis not present

## 2022-12-26 DIAGNOSIS — E669 Obesity, unspecified: Secondary | ICD-10-CM | POA: Diagnosis not present

## 2022-12-26 LAB — BASIC METABOLIC PANEL
Anion gap: 15 (ref 5–15)
BUN: 24 mg/dL — ABNORMAL HIGH (ref 8–23)
CO2: 32 mmol/L (ref 22–32)
Calcium: 12.1 mg/dL — ABNORMAL HIGH (ref 8.9–10.3)
Chloride: 93 mmol/L — ABNORMAL LOW (ref 98–111)
Creatinine, Ser: 1.32 mg/dL — ABNORMAL HIGH (ref 0.61–1.24)
GFR, Estimated: 55 mL/min — ABNORMAL LOW (ref >60)
Glucose, Bld: 120 mg/dL — ABNORMAL HIGH (ref 70–99)
Potassium: 3 mmol/L — ABNORMAL LOW (ref 3.5–5.1)
Sodium: 140 mmol/L (ref 135–145)

## 2022-12-26 LAB — CBC
HCT: 49.8 % (ref 39.0–52.0)
Hemoglobin: 16.1 g/dL (ref 13.0–17.0)
MCH: 29.9 pg (ref 26.0–34.0)
MCHC: 32.3 g/dL (ref 30.0–36.0)
MCV: 92.4 fL (ref 80.0–100.0)
Platelets: 397 10*3/uL (ref 150–400)
RBC: 5.39 MIL/uL (ref 4.22–5.81)
RDW: 13.9 % (ref 11.5–15.5)
WBC: 11.2 10*3/uL — ABNORMAL HIGH (ref 4.0–10.5)
nRBC: 0 % (ref 0.0–0.2)

## 2022-12-26 LAB — SURGICAL PCR SCREEN
MRSA, PCR: NEGATIVE
Staphylococcus aureus: NEGATIVE

## 2022-12-26 NOTE — Telephone Encounter (Signed)
BMET completed today at Preop appt.   Forward to MD to review.

## 2022-12-26 NOTE — Anesthesia Preprocedure Evaluation (Signed)
Anesthesia Evaluation  Patient identified by MRN, date of birth, ID band Patient awake    Reviewed: Allergy & Precautions, NPO status , Patient's Chart, lab work & pertinent test results  History of Anesthesia Complications Negative for: history of anesthetic complications  Airway Mallampati: II  TM Distance: <3 FB Neck ROM: Full    Dental  (+) Teeth Intact, Dental Advisory Given, Caps   Pulmonary neg pulmonary ROS   Pulmonary exam normal        Cardiovascular hypertension, Pt. on medications + CAD (nonobstructive)  Normal cardiovascular exam     Neuro/Psych  PSYCHIATRIC DISORDERS Anxiety Depression     Neuromuscular disease negative neurological ROS     GI/Hepatic negative GI ROS, Neg liver ROS,,,  Endo/Other  negative endocrine ROS    Renal/GU negative Renal ROS   H/o prostate cancer    Musculoskeletal negative musculoskeletal ROS (+)    Abdominal   Peds  Hematology negative hematology ROS (+)   Anesthesia Other Findings   Reproductive/Obstetrics                             Anesthesia Physical Anesthesia Plan  ASA: 3  Anesthesia Plan: MAC and Spinal   Post-op Pain Management: Minimal or no pain anticipated   Induction: Intravenous  PONV Risk Score and Plan: 2 and Ondansetron, Treatment may vary due to age or medical condition and Propofol infusion  Airway Management Planned: Oral ETT and LMA  Additional Equipment: None  Intra-op Plan:   Post-operative Plan: Extubation in OR  Informed Consent: I have reviewed the patients History and Physical, chart, labs and discussed the procedure including the risks, benefits and alternatives for the proposed anesthesia with the patient or authorized representative who has indicated his/her understanding and acceptance.     Dental advisory given  Plan Discussed with: Anesthesiologist and CRNA  Anesthesia Plan Comments: (See PAT  note 12/26/2022  DISCUSSION:77 y.o. never smoker with h/o HTN, nonobstructive CAD, left hip djd scheduled for above procedure 12/27/2022 with Dr. Melrose Nakayama.    Pt last seen by cardiology 12/19/2022. Per OV note, "Preop evaluation: prior to left hip surgery scheduled for 12/27/22. Echocardiogram 04/2022 showed EF 50 to 55%, inferior basal hypokinesis, mild RV dysfunction, ascending aorta measuring 44 mm.  He reports functional capacity greater than 4 METS, denies any exertional symptoms.  RCRI score 0.  No further cardiac workup recommended prior to procedure" )        Anesthesia Quick Evaluation

## 2022-12-26 NOTE — Progress Notes (Signed)
Anesthesia Chart Review   Case: [id number] Date/Time: 12/27/22 [id number]   Procedure: LEFT TOTAL HIP ARTHROPLASTY ANTERIOR APPROACH (Left: Hip)   Anesthesia type: Spinal   Pre-op diagnosis: LEFT HIP DEGENERATIVE JOINT DISEASE   Location: [facility] ROOM 06 / [facility]   Surgeons: Melrose Nakayama, MD       DISCUSSION:77 y.o. never smoker with h/o HTN, nonobstructive CAD, left hip djd scheduled for above procedure 12/27/2022 with Dr. Melrose Nakayama.   Pt last seen by cardiology 12/19/2022. Per OV note, "Preop evaluation: prior to left hip surgery scheduled for 12/27/22. Echocardiogram 04/2022 showed EF 50 to 55%, inferior basal hypokinesis, mild RV dysfunction, ascending aorta measuring 44 mm.  He reports functional capacity greater than 4 METS, denies any exertional symptoms.  RCRI score 0.  No further cardiac workup recommended prior to procedure"  Anticipate pt can proceed with planned procedure barring acute status change.   VS: Ht '5\' 10"'$  (1.778 m)   Wt 106.6 kg   BMI 33.72 kg/m   PROVIDERS: Colon Branch, MD is PCP   Cardiologist - Oswaldo Milian, MD  LABS: Labs reviewed: Acceptable for surgery. (all labs ordered are listed, but only abnormal results are displayed)  Labs Reviewed  BASIC METABOLIC PANEL - Abnormal; Notable for the following components:      Result Value   Potassium 3.0 (*)    Chloride 93 (*)    Glucose, Bld 120 (*)    BUN 24 (*)    Creatinine, Ser 1.32 (*)    Calcium 12.1 (*)    GFR, Estimated 55 (*)    All other components within normal limits  CBC - Abnormal; Notable for the following components:   WBC 11.2 (*)    All other components within normal limits  SURGICAL PCR SCREEN  TYPE AND SCREEN     IMAGES:   EKG:   CV: Echo 05/16/2022 1. Abnormal septalmotion infeior basal hyypokinesis . Left ventricular  ejection fraction, by estimation, is 50 to 55%. The left ventricle has low  normal function. The left ventricle has no regional wall motion  abnormalities.  Left ventricular diastolic  parameters were normal.   2. Right ventricular systolic function is mildly reduced. The right  ventricular size is normal. There is normal pulmonary artery systolic  pressure.   3. The mitral valve is abnormal. No evidence of mitral valve  regurgitation. No evidence of mitral stenosis.   4. The aortic valve is tricuspid. There is mild calcification of the  aortic valve. Aortic valve regurgitation is not visualized. Aortic valve  sclerosis is present, with no evidence of aortic valve stenosis.   5. Aortic dilatation noted. There is moderate dilatation of the ascending  aorta, measuring 44 mm.   6. The inferior vena cava is dilated in size with >50% respiratory  variability, suggesting right atrial pressure of 8 mmHg.  Past Medical History:  Diagnosis Date   Arthritis    BCC (basal cell carcinoma), leg    Right calf, L nape of neck at hairline   CAD (coronary artery disease)    CP, cath, non-obstructive   Diverticulosis    colon   Erectile dysfunction following radical prostatectomy    Hyperlipidemia    Hypertension    Insomnia    Melanoma ([place]) 2010   face, sees derm    Prostate CA ([place]) 07/2007   s/p robotic surgery   Spermatocele    Left    Past Surgical History:  Procedure Laterality Date   APPENDECTOMY  age 59   HERNIA REPAIR  2010   KNEE ARTHROSCOPY Left 1965   meniscal tear   PROSTATECTOMY  07/25/2007    robotic,  for prostate cancer     SHOULDER ARTHROSCOPY Left 07/09/2013   Dr Tamera Punt    TRANSFORAMINAL LUMBAR INTERBODY FUSION (TLIF) WITH PEDICLE SCREW FIXATION 1 LEVEL Right 12/04/2019   Procedure: RIGHT-SIDED TRANSFORAMINAL LUMBAR INTERBODY FUSION LUMBAR FOUR THROUGH FIVE WITH INSTRUMENTATION AND ALLOGRAFT;  Surgeon: Phylliss Bob, MD;  Location: [facility];  Service: Orthopedics;  Laterality: Right;    MEDICATIONS:  atorvastatin (LIPITOR) 40 MG tablet   clorazepate (TRANXENE) 7.5 MG tablet   enalapril (VASOTEC) 5 MG tablet    fenofibrate micronized (LOFIBRA) 134 MG capsule   fluticasone (FLONASE) 50 MCG/ACT nasal spray   furosemide (LASIX) 20 MG tablet   traZODone (DESYREL) 100 MG tablet   No current facility-administered medications for this encounter.    Konrad Felix Ward, PA-C [facility] Pre-Surgical Testing [phone]

## 2022-12-27 ENCOUNTER — Ambulatory Visit (HOSPITAL_COMMUNITY): Payer: Medicare Other | Admitting: Physician Assistant

## 2022-12-27 ENCOUNTER — Other Ambulatory Visit: Payer: Self-pay

## 2022-12-27 ENCOUNTER — Ambulatory Visit (HOSPITAL_COMMUNITY)
Admission: RE | Admit: 2022-12-27 | Discharge: 2022-12-27 | Disposition: A | Discharge status: Home / Self Care | Payer: Medicare Other | Source: Home / Self Care | Attending: Orthopaedic Surgery | Admitting: Orthopaedic Surgery

## 2022-12-27 ENCOUNTER — Encounter (HOSPITAL_COMMUNITY): Payer: Self-pay | Admitting: Orthopaedic Surgery

## 2022-12-27 ENCOUNTER — Ambulatory Visit (HOSPITAL_COMMUNITY): Payer: Medicare Other

## 2022-12-27 ENCOUNTER — Ambulatory Visit (HOSPITAL_BASED_OUTPATIENT_CLINIC_OR_DEPARTMENT_OTHER): Payer: Medicare Other | Admitting: Anesthesiology

## 2022-12-27 ENCOUNTER — Encounter (HOSPITAL_COMMUNITY)
Admission: RE | Disposition: A | Discharge status: Home / Self Care | Payer: Self-pay | Source: Home / Self Care | Attending: Orthopaedic Surgery

## 2022-12-27 DIAGNOSIS — I10 Essential (primary) hypertension: Secondary | ICD-10-CM | POA: Insufficient documentation

## 2022-12-27 DIAGNOSIS — F418 Other specified anxiety disorders: Secondary | ICD-10-CM | POA: Diagnosis not present

## 2022-12-27 DIAGNOSIS — Z96642 Presence of left artificial hip joint: Secondary | ICD-10-CM | POA: Diagnosis not present

## 2022-12-27 DIAGNOSIS — I2510 Atherosclerotic heart disease of native coronary artery without angina pectoris: Secondary | ICD-10-CM | POA: Diagnosis not present

## 2022-12-27 DIAGNOSIS — Z471 Aftercare following joint replacement surgery: Secondary | ICD-10-CM | POA: Diagnosis not present

## 2022-12-27 DIAGNOSIS — M1612 Unilateral primary osteoarthritis, left hip: Secondary | ICD-10-CM

## 2022-12-27 HISTORY — PX: TOTAL HIP ARTHROPLASTY: SHX124

## 2022-12-27 LAB — TYPE AND SCREEN
ABO/RH(D): O POS
Antibody Screen: NEGATIVE

## 2022-12-27 SURGERY — ARTHROPLASTY, HIP, TOTAL, ANTERIOR APPROACH
Anesthesia: Monitor Anesthesia Care | Site: Hip | Laterality: Left

## 2022-12-27 MED ORDER — PHENYLEPHRINE HCL-NACL 20-0.9 MG/250ML-% IV SOLN
INTRAVENOUS | Status: DC | PRN
  Administered 2022-12-27: 50 ug/min via INTRAVENOUS

## 2022-12-27 MED ORDER — ACETAMINOPHEN 160 MG/5ML PO SOLN: 325.0000 mg | ORAL | Status: DC | PRN

## 2022-12-27 MED ORDER — PHENYLEPHRINE HCL (PRESSORS) 10 MG/ML IV SOLN
INTRAVENOUS | Status: AC
  Filled 2022-12-27: qty 1

## 2022-12-27 MED ORDER — BUPIVACAINE LIPOSOME 1.3 % IJ SUSP: 10.0000 mL | Freq: Once | INTRAMUSCULAR | Status: DC

## 2022-12-27 MED ORDER — ONDANSETRON HCL 4 MG/2ML IJ SOLN: 4.0000 mg | Freq: Once | INTRAMUSCULAR | Status: DC | PRN

## 2022-12-27 MED ORDER — CHLORHEXIDINE GLUCONATE 0.12 % MT SOLN
15.0000 mL | Freq: Once | OROMUCOSAL | Status: AC
  Administered 2022-12-27: 15 mL via OROMUCOSAL

## 2022-12-27 MED ORDER — ORAL CARE MOUTH RINSE: 15.0000 mL | Freq: Once | OROMUCOSAL | Status: AC

## 2022-12-27 MED ORDER — PHENYLEPHRINE 80 MCG/ML (10ML) SYRINGE FOR IV PUSH (FOR BLOOD PRESSURE SUPPORT)
PREFILLED_SYRINGE | INTRAVENOUS | Status: DC | PRN
  Administered 2022-12-27 (×3): 80 ug via INTRAVENOUS

## 2022-12-27 MED ORDER — BUPIVACAINE LIPOSOME 1.3 % IJ SUSP
INTRAMUSCULAR | Status: AC
  Filled 2022-12-27: qty 10

## 2022-12-27 MED ORDER — DEXMEDETOMIDINE HCL IN NACL 80 MCG/20ML IV SOLN
INTRAVENOUS | Status: DC | PRN
  Administered 2022-12-27: 8 ug via BUCCAL

## 2022-12-27 MED ORDER — LACTATED RINGERS IV BOLUS
500.0000 mL | Freq: Once | INTRAVENOUS | Status: AC
  Administered 2022-12-27: 500 mL via INTRAVENOUS

## 2022-12-27 MED ORDER — TIZANIDINE HCL 4 MG PO TABS: 4.0000 mg | ORAL_TABLET | Freq: Four times a day (QID) | ORAL | 1 refills | Status: DC | PRN

## 2022-12-27 MED ORDER — TRANEXAMIC ACID-NACL 1000-0.7 MG/100ML-% IV SOLN
1000.0000 mg | INTRAVENOUS | Status: AC
  Administered 2022-12-27: 1000 mg via INTRAVENOUS
  Filled 2022-12-27: qty 100

## 2022-12-27 MED ORDER — OXYCODONE HCL 5 MG PO TABS
5.0000 mg | ORAL_TABLET | Freq: Once | ORAL | Status: AC | PRN
  Administered 2022-12-27: 5 mg via ORAL

## 2022-12-27 MED ORDER — FENTANYL CITRATE (PF) 100 MCG/2ML IJ SOLN
INTRAMUSCULAR | Status: AC
  Filled 2022-12-27: qty 2

## 2022-12-27 MED ORDER — ACETAMINOPHEN 325 MG PO TABS
ORAL_TABLET | ORAL | Status: AC
  Filled 2022-12-27: qty 2

## 2022-12-27 MED ORDER — ONDANSETRON HCL 4 MG/2ML IJ SOLN
INTRAMUSCULAR | Status: AC
  Filled 2022-12-27: qty 2

## 2022-12-27 MED ORDER — PROPOFOL 500 MG/50ML IV EMUL
INTRAVENOUS | Status: DC | PRN
  Administered 2022-12-27: 75 ug/kg/min via INTRAVENOUS
  Administered 2022-12-27: 30 mg via INTRAVENOUS

## 2022-12-27 MED ORDER — BUPIVACAINE-EPINEPHRINE (PF) 0.25% -1:200000 IJ SOLN
INTRAMUSCULAR | Status: DC | PRN
  Administered 2022-12-27: 30 mL

## 2022-12-27 MED ORDER — MEPERIDINE HCL 50 MG/ML IJ SOLN: 6.2500 mg | INTRAMUSCULAR | Status: DC | PRN

## 2022-12-27 MED ORDER — TRANEXAMIC ACID 1000 MG/10ML IV SOLN
INTRAVENOUS | Status: DC | PRN
  Administered 2022-12-27: 2000 mg via TOPICAL

## 2022-12-27 MED ORDER — 0.9 % SODIUM CHLORIDE (POUR BTL) OPTIME
TOPICAL | Status: DC | PRN
  Administered 2022-12-27: 1000 mL

## 2022-12-27 MED ORDER — TRANEXAMIC ACID 1000 MG/10ML IV SOLN
2000.0000 mg | INTRAVENOUS | Status: DC
  Filled 2022-12-27: qty 20

## 2022-12-27 MED ORDER — OXYCODONE-ACETAMINOPHEN 5-325 MG PO TABS: 1.0000 | ORAL_TABLET | Freq: Four times a day (QID) | ORAL | 0 refills | Status: DC | PRN

## 2022-12-27 MED ORDER — VANCOMYCIN HCL IN DEXTROSE 1-5 GM/200ML-% IV SOLN: 1000.0000 mg | Freq: Two times a day (BID) | INTRAVENOUS | Status: DC

## 2022-12-27 MED ORDER — ACETAMINOPHEN 325 MG PO TABS
325.0000 mg | ORAL_TABLET | ORAL | Status: DC | PRN
  Administered 2022-12-27: 650 mg via ORAL

## 2022-12-27 MED ORDER — OXYCODONE HCL 5 MG PO TABS
ORAL_TABLET | ORAL | Status: AC
  Filled 2022-12-27: qty 1

## 2022-12-27 MED ORDER — BUPIVACAINE LIPOSOME 1.3 % IJ SUSP
INTRAMUSCULAR | Status: DC | PRN
  Administered 2022-12-27: 10 mL

## 2022-12-27 MED ORDER — LACTATED RINGERS IV SOLN: INTRAVENOUS | Status: DC

## 2022-12-27 MED ORDER — DEXAMETHASONE SODIUM PHOSPHATE 10 MG/ML IJ SOLN
INTRAMUSCULAR | Status: AC
  Filled 2022-12-27: qty 1

## 2022-12-27 MED ORDER — POVIDONE-IODINE 10 % EX SWAB
2.0000 | Freq: Once | CUTANEOUS | Status: AC
  Administered 2022-12-27: 2 via TOPICAL

## 2022-12-27 MED ORDER — BUPIVACAINE-EPINEPHRINE (PF) 0.25% -1:200000 IJ SOLN
INTRAMUSCULAR | Status: AC
  Filled 2022-12-27: qty 30

## 2022-12-27 MED ORDER — LACTATED RINGERS IV BOLUS
250.0000 mL | Freq: Once | INTRAVENOUS | Status: AC
  Administered 2022-12-27: 250 mL via INTRAVENOUS

## 2022-12-27 MED ORDER — STERILE WATER FOR IRRIGATION IR SOLN
Status: DC | PRN
  Administered 2022-12-27: 2000 mL

## 2022-12-27 MED ORDER — OXYCODONE HCL 5 MG/5ML PO SOLN: 5.0000 mg | Freq: Once | ORAL | Status: AC | PRN

## 2022-12-27 MED ORDER — LACTATED RINGERS IV BOLUS: 250.0000 mL | Freq: Once | INTRAVENOUS | Status: DC

## 2022-12-27 MED ORDER — TRANEXAMIC ACID-NACL 1000-0.7 MG/100ML-% IV SOLN: 1000.0000 mg | Freq: Once | INTRAVENOUS | Status: DC

## 2022-12-27 MED ORDER — FENTANYL CITRATE PF 50 MCG/ML IJ SOSY: 25.0000 ug | PREFILLED_SYRINGE | INTRAMUSCULAR | Status: DC | PRN

## 2022-12-27 MED ORDER — APIXABAN 2.5 MG PO TABS: 2.5000 mg | ORAL_TABLET | Freq: Two times a day (BID) | ORAL | 0 refills | Status: DC

## 2022-12-27 MED ORDER — PROPOFOL 1000 MG/100ML IV EMUL
INTRAVENOUS | Status: AC
  Filled 2022-12-27: qty 100

## 2022-12-27 MED ORDER — FENTANYL CITRATE (PF) 100 MCG/2ML IJ SOLN
INTRAMUSCULAR | Status: DC | PRN
  Administered 2022-12-27 (×2): 50 ug via INTRAVENOUS

## 2022-12-27 MED ORDER — LIDOCAINE 2% (20 MG/ML) 5 ML SYRINGE
INTRAMUSCULAR | Status: DC | PRN
  Administered 2022-12-27: 60 mg via INTRAVENOUS

## 2022-12-27 MED ORDER — VANCOMYCIN HCL 1500 MG/300ML IV SOLN
1500.0000 mg | INTRAVENOUS | Status: AC
  Administered 2022-12-27 (×2): 1500 mg via INTRAVENOUS
  Filled 2022-12-27: qty 300

## 2022-12-27 SURGICAL SUPPLY — 53 items
BAG COUNTER SPONGE SURGICOUNT (BAG) IMPLANT
BAG DECANTER FOR FLEXI CONT (MISCELLANEOUS) ×2 IMPLANT
BAG SPNG CNTER NS LX DISP (BAG) ×1
BLADE SAW SGTL 18X1.27X75 (BLADE) ×2 IMPLANT
BOOTIES KNEE HIGH SLOAN (MISCELLANEOUS) ×2 IMPLANT
CELLS DAT CNTRL 66122 CELL SVR (MISCELLANEOUS) ×1 IMPLANT
COVER PERINEAL POST (MISCELLANEOUS) ×2 IMPLANT
COVER SURGICAL LIGHT HANDLE (MISCELLANEOUS) ×2 IMPLANT
CUP ACETABULAR GRIPTON 100 52 (Orthopedic Implant) IMPLANT
DRAPE FOOT SWITCH (DRAPES) ×2 IMPLANT
DRAPE IMP U-DRAPE 54X76 (DRAPES) ×2 IMPLANT
DRAPE STERI IOBAN 125X83 (DRAPES) ×2 IMPLANT
DRAPE U-SHAPE 47X51 STRL (DRAPES) ×4 IMPLANT
DRSG AQUACEL AG ADV 3.5X 6 (GAUZE/BANDAGES/DRESSINGS) ×2 IMPLANT
DURAPREP 26ML APPLICATOR (WOUND CARE) ×2 IMPLANT
ELECT BLADE TIP CTD 4 INCH (ELECTRODE) ×2 IMPLANT
ELECT REM PT RETURN 15FT ADLT (MISCELLANEOUS) ×2 IMPLANT
ELIMINATOR HOLE APEX DEPUY (Hips) IMPLANT
GLOVE BIO SURGEON STRL SZ8 (GLOVE) ×4 IMPLANT
GLOVE BIOGEL PI IND STRL 7.0 (GLOVE) ×2 IMPLANT
GLOVE BIOGEL PI IND STRL 8 (GLOVE) ×4 IMPLANT
GLOVE SURG SYN 7.0 (GLOVE) ×1 IMPLANT
GLOVE SURG SYN 7.0 PF PI (GLOVE) ×2 IMPLANT
GOWN SRG XL LVL 4 BRTHBL STRL (GOWNS) ×2 IMPLANT
GOWN STRL NON-REIN XL LVL4 (GOWNS) ×1
GOWN STRL REUS W/ TWL XL LVL3 (GOWN DISPOSABLE) ×4 IMPLANT
GOWN STRL REUS W/TWL XL LVL3 (GOWN DISPOSABLE) ×2
GRIPTON 100 52 (Orthopedic Implant) ×1 IMPLANT
HEAD M SROM 36MM PLUS 1.5 (Hips) IMPLANT
HOLDER FOLEY CATH W/STRAP (MISCELLANEOUS) ×2 IMPLANT
KIT TURNOVER KIT A (KITS) IMPLANT
LINER ACETAB NEUTRAL 36ID 520D (Liner) IMPLANT
MANIFOLD NEPTUNE II (INSTRUMENTS) ×2 IMPLANT
NDL HYPO 22X1.5 SAFETY MO (MISCELLANEOUS) ×2 IMPLANT
NEEDLE HYPO 22X1.5 SAFETY MO (MISCELLANEOUS) ×1 IMPLANT
NEEDLE SAFETY HYPO 22GAX1.5 (MISCELLANEOUS) ×1
NS IRRIG 1000ML POUR BTL (IV SOLUTION) ×2 IMPLANT
PACK ANTERIOR HIP CUSTOM (KITS) ×2 IMPLANT
PENCIL SMOKE EVACUATOR (MISCELLANEOUS) IMPLANT
PROTECTOR NERVE ULNAR (MISCELLANEOUS) ×2 IMPLANT
RETRACTOR WND ALEXIS 18 MED (MISCELLANEOUS) ×2 IMPLANT
RTRCTR WOUND ALEXIS 18CM MED (MISCELLANEOUS) ×1
SPIKE FLUID TRANSFER (MISCELLANEOUS) ×2 IMPLANT
SROM M HEAD 36MM PLUS 1.5 (Hips) ×1 IMPLANT
STEM FEMORAL SZ6 HIGH ACTIS (Stem) IMPLANT
SUT ETHIBOND NAB CT1 #1 30IN (SUTURE) ×4 IMPLANT
SUT VIC AB 1 CT1 36 (SUTURE) ×2 IMPLANT
SUT VIC AB 2-0 CT1 27 (SUTURE) ×1
SUT VIC AB 2-0 CT1 TAPERPNT 27 (SUTURE) ×2 IMPLANT
SUT VICRYL AB 3-0 FS1 BRD 27IN (SUTURE) ×2 IMPLANT
SUT VLOC 180 0 24IN GS25 (SUTURE) ×2 IMPLANT
SYR 50ML LL SCALE MARK (SYRINGE) ×2 IMPLANT
TRAY FOLEY MTR SLVR 16FR STAT (SET/KITS/TRAYS/PACK) ×2 IMPLANT

## 2022-12-27 NOTE — Telephone Encounter (Signed)
Patient current admit d/t surgery.   MD discussed with anesthesiologist.   Will follow up

## 2022-12-27 NOTE — Op Note (Signed)
PRE-OP DIAGNOSIS:  LEFT HIP DEGENERATIVE JOINT DISEASE POST-OP DIAGNOSIS: same PROCEDURE:  LEFT TOTAL HIP ARTHROPLASTY ANTERIOR APPROACH ANESTHESIA:  Spinal and MAC SURGEON:  Melrose Nakayama MD ASSISTANT:  Loni Dolly PA-C   INDICATIONS FOR PROCEDURE:  The patient is a 77 y.o. male with a long history of a painful hip.  This has persisted despite multiple conservative measures.  The patient has persisted with pain and dysfunction making rest and activity difficult.  A total hip replacement is offered as surgical treatment.  Informed operative consent was obtained after discussion of possible complications including reaction to anesthesia, infection, neurovascular injury, dislocation, DVT, PE, and death.  The importance of the postoperative rehab program to optimize result was stressed with the patient.  SUMMARY OF FINDINGS AND PROCEDURE:  Under the above anesthesia through a anterior approach an the Hana table a left THR was performed.  The patient had severe degenerative change and good bone quality.  We used DePuy components to replace the hip and these were size 6 high offset Actis femur capped with a +1.5 65m metal hip ball.  On the acetabular side we used a size 52 Gription shell with a plus 0 neutral polyethylene liner.  We did use a hole eliminator.  ALoni DollyPA-C assisted throughout and was invaluable to the completion of the case in that he helped position and retract while I performed the procedure.  He also closed simultaneously to help minimize OR time.  I used fluoroscopy throughout the case to check position of components and leg lengths and read all these views myself.  DESCRIPTION OF PROCEDURE:  The patient was taken to the OR suite where the above anesthetic was applied.  The patient was then positioned on the Hana table supine.  All bony prominences were appropriately padded.  Prep and drape was then performed in normal sterile fashion.  The patient was given vancomycin preoperative  antibiotic and an appropriate time out was performed.  We then took an anterior approach to the left hip.  Dissection was taken through adipose to the tensor fascia lata fascia.  This structure was incised longitudinally and we dissected in the intermuscular interval just medial to this muscle.  Cobra retractors were placed superior and inferior to the femoral neck superficial to the capsule.  A capsular incision was then made and the retractors were placed along the femoral neck.  Xray was brought in to get a good level for the femoral neck cut which was made with an oscillating saw and osteotome.  The femoral head was removed with a corkscrew.  The acetabulum was exposed and some labral tissues were excised. Reaming was taken to the inside wall of the pelvis and sequentially up to 1 mm smaller than the actual component.  A trial of components was done and then the aforementioned acetabular shell was placed in appropriate tilt and anteversion confirmed by fluoroscopy. The liner was placed along with the hole eliminator and attention was turned to the femur.  The leg was brought down and over into adduction and the elevator bar was used to raise the femur up gently in the wound.  The piriformis was released with care taken to preserve the obturator internus attachment and all of the posterior capsule. The femur was reamed and then broached to the appropriate size.  A trial reduction was done and the aforementioned head and neck assembly gave uKoreathe best stability in extension with external rotation.  Leg lengths were felt to be about equal  by fluoroscopic exam.  The trial components were removed and the wound irrigated.  We then placed the femoral component in appropriate anteversion.  The head was applied to a dry stem neck and the hip again reduced.  It was again stable in the aforementioned position.  The would was irrigated again followed by re-approximation of anterior capsule with ethibond suture. Tensor  fascia was repaired with V-loc suture  followed by deep closure with #O and #2 undyed vicryl.  Skin was closed with subQ stitch and steristrips followed by a sterile dressing.  EBL and IOF can be obtained from anesthesia records.  DISPOSITION:  The patient was taken to PACU to potentially go home same day depending on ability to walk and tolerate liquids.

## 2022-12-27 NOTE — Interval H&P Note (Signed)
History and Physical Interval Note:  12/27/2022 9:02 AM  Larry Elliott  has presented today for surgery, with the diagnosis of LEFT HIP DEGENERATIVE JOINT DISEASE.  The various methods of treatment have been discussed with the patient and family. After consideration of risks, benefits and other options for treatment, the patient has consented to  Procedure(s): LEFT TOTAL HIP ARTHROPLASTY ANTERIOR APPROACH (Left) as a surgical intervention.  The patient's history has been reviewed, patient examined, no change in status, stable for surgery.  I have reviewed the patient's chart and labs.  Questions were answered to the patient's satisfaction.     Hessie Dibble

## 2022-12-27 NOTE — Anesthesia Postprocedure Evaluation (Signed)
Anesthesia Post Note  Patient: SHUBHAM KNIFFIN  Procedure(s) Performed: LEFT TOTAL HIP ARTHROPLASTY ANTERIOR APPROACH (Left: Hip)     Patient location during evaluation: PACU Anesthesia Type: MAC and Spinal Level of consciousness: oriented and awake and alert Pain management: pain level controlled Vital Signs Assessment: post-procedure vital signs reviewed and stable Respiratory status: spontaneous breathing, respiratory function stable and patient connected to nasal cannula oxygen Cardiovascular status: blood pressure returned to baseline and stable Postop Assessment: no headache, no backache and no apparent nausea or vomiting Anesthetic complications: no   No notable events documented.  Last Vitals:  Vitals:   12/27/22 1547 12/27/22 1655  BP: 137/74 (!) 154/89  Pulse: 90 97  Resp: 18 18  Temp:  36.4 C  SpO2: 93% 93%    Last Pain:  Vitals:   12/27/22 1655  TempSrc:   PainSc: 0-No pain                 Gaetano Romberger

## 2022-12-27 NOTE — Transfer of Care (Signed)
Immediate Anesthesia Transfer of Care Note  Patient: Larry Elliott  Procedure(s) Performed: Procedure(s): LEFT TOTAL HIP ARTHROPLASTY ANTERIOR APPROACH (Left)  Patient Location: PACU  Anesthesia Type:Spinal  Level of Consciousness: awake, alert  and oriented  Airway & Oxygen Therapy: Patient Spontanous Breathing  Post-op Assessment: Report given to RN and Post -op Vital signs reviewed and stable  Post vital signs: Reviewed and stable  Last Vitals:  Vitals:   12/27/22 0745  BP: 134/68  Pulse: 86  Resp: 17  Temp: 36.7 C  SpO2: [id number]    Complications: No apparent anesthesia complications

## 2022-12-27 NOTE — Evaluation (Signed)
Physical Therapy Evaluation Patient Details Name: Larry Elliott MRN: [id number] DOB: [birth year] Today's Date: 12/27/2022  History of Present Illness  77 yo male presents to therapy s/p L THA, anterior approach on 12/27/2022 due to failure of conservative measures. Pt has PMH including but not limited to: radiculopathy, LBP, prostate ca, skin ca, HTN, HDL, lumbar and shoulder sx.  Clinical Impression    Larry Elliott is a 77 y.o. male POD 0 s/p L THA. Patient reports IND with mobility at baseline. Patient is now limited by functional impairments (see PT problem list below) and requires min guard and cues for safety with transfers and gait with RW. Patient was able to ambulate 45 and 40 feet with RW and min guard and cues for safe walker management. Patient educated on safe sequencing for stair mobility and car transfer pt and daughter verbalized understanding of safe guarding position for people assisting with mobility. Patient instructed in exercises to facilitate ROM and circulation reviewed and HO provided . Patient will benefit from continued skilled PT interventions to address impairments and progress towards PLOF. Patient has met mobility goals at adequate level for discharge home with anticipation of OPPT starting 3/7; will continue to follow if pt continues acute stay to progress towards Mod I goals.    Recommendations for follow up therapy are one component of a multi-disciplinary discharge planning process, led by the attending physician.  Recommendations may be updated based on patient status, additional functional criteria and insurance authorization.  Follow Up Recommendations Outpatient PT      Assistance Recommended at Discharge Frequent or constant Supervision/Assistance  Patient can return home with the following  A little help with walking and/or transfers;A little help with bathing/dressing/bathroom;Assistance with cooking/housework;Assist for transportation;Help with stairs  or ramp for entrance    Equipment Recommendations None recommended by PT (pt reports having DME in home setting)  Recommendations for Other Services       Functional Status Assessment Patient has had a recent decline in their functional status and demonstrates the ability to make significant improvements in function in a reasonable and predictable amount of time.     Precautions / Restrictions Precautions Precautions: Knee;Fall Restrictions Weight Bearing Restrictions: No      Mobility  Bed Mobility               General bed mobility comments: pt in recliner when PT arrived    Transfers Overall transfer level: Needs assistance Equipment used: Rolling walker (2 wheels) Transfers: Sit to/from Stand Sit to Stand: Min guard           General transfer comment: cues for attention and safety proper UE and AD placement    Ambulation/Gait Ambulation/Gait assistance: Min guard Gait Distance (Feet): 40 Feet Assistive device: Rolling walker (2 wheels) Gait Pattern/deviations: Step-to pattern Gait velocity: decreased        Stairs            Wheelchair Mobility    Modified Rankin (Stroke Patients Only)       Balance Overall balance assessment: Needs assistance Sitting-balance support: Feet supported Sitting balance-Leahy Scale: Fair     Standing balance support: No upper extremity supported (static and B UE at [facility] for dynamic) Standing balance-Leahy Scale: Fair                               Pertinent Vitals/Pain Pain Assessment Pain Assessment: 0-10 Pain Score: 8  Pain Location:  L hip Pain Descriptors / Indicators: Aching, Constant, Operative site guarding Pain Intervention(s): Limited activity within patient's tolerance, Monitored during session, Premedicated before session, Ice applied    Home Living Family/patient expects to be discharged to:: Private residence Living Arrangements: Spouse/significant other;Children Available  Help at Discharge: Family Type of Home: House Home Access: Stairs to enter Entrance Stairs-Rails: Can reach both Entrance Stairs-Number of Steps: 2   Home Layout: One level Home Equipment: Conservation officer, nature (2 wheels);BSC/3in1      Prior Function Prior Level of Function : Independent/Modified Independent             Mobility Comments: IND with all ADLs, self care tasks, IADLs and driving NO AD       Hand Dominance        Extremity/Trunk Assessment        Lower Extremity Assessment Lower Extremity Assessment: LLE deficits/detail LLE Deficits / Details: ankle DF/PF 5/5 LLE Sensation: WNL       Communication   Communication: HOH  Cognition Arousal/Alertness: Awake/alert Behavior During Therapy: WFL for tasks assessed/performed Overall Cognitive Status: Within Functional Limits for tasks assessed                                          General Comments General comments (skin integrity, edema, etc.): pt required cues for pursed lip breathing with activity and was desaturating s/p sx in the 80s, PT eval on RA and maintained O2 saturation >/90%    Exercises Total Joint Exercises Ankle Circles/Pumps: AROM, Both, 20 reps Quad Sets: AROM, Left, 5 reps Heel Slides: AAROM, Left, 5 reps Hip ABduction/ADduction: AROM, Left, 5 reps Long Arc Quad: AROM, Left, 5 reps   Assessment/Plan    PT Assessment Patient needs continued PT services  PT Problem List Decreased strength;Decreased range of motion;Decreased activity tolerance;Decreased balance;Decreased mobility;Decreased coordination;Decreased safety awareness;Decreased knowledge of use of DME;Pain       PT Treatment Interventions DME instruction;Gait training;Stair training;Functional mobility training;Therapeutic activities;Therapeutic exercise;Balance training;Neuromuscular re-education;Patient/family education;Modalities    PT Goals (Current goals can be found in the Care Plan section)  Acute Rehab  PT Goals Patient Stated Goal: to be more mobile PT Goal Formulation: With patient    Frequency       Co-evaluation               AM-PAC PT "6 Clicks" Mobility  Outcome Measure Help needed turning from your back to your side while in a flat bed without using bedrails?: A Little Help needed moving from lying on your back to sitting on the side of a flat bed without using bedrails?: A Little Help needed moving to and from a bed to a chair (including a wheelchair)?: A Little Help needed standing up from a chair using your arms (e.g., wheelchair or bedside chair)?: A Little Help needed to walk in hospital room?: A Little Help needed climbing 3-5 steps with a railing? : A Little 6 Click Score: 18    End of Session Equipment Utilized During Treatment: Gait belt Activity Tolerance: Patient tolerated treatment well;No increased pain Patient left: in chair;with call bell/phone within reach;with family/visitor present Nurse Communication: Mobility status;Other (comment) (progression toward d/c) PT Visit Diagnosis: Unsteadiness on feet (R26.81);Other abnormalities of gait and mobility (R26.89);Muscle weakness (generalized) (M62.81);Pain;Difficulty in walking, not elsewhere classified (R26.2) Pain - Right/Left: Left Pain - part of body: Hip    Time: [id number] PT  Time Calculation (min) (ACUTE ONLY): 42 min   Charges:   PT Evaluation $PT Eval Low Complexity: 1 Low PT Treatments $Gait Training: 8-22 mins $Therapeutic Exercise: 8-22 mins        Baird Lyons, PT   Adair Patter 12/27/2022, 4:46 PM

## 2022-12-27 NOTE — Telephone Encounter (Signed)
Labs show worsening creatinine and low potassium.  Recommend stopping Lasix.  Would switch to as needed Lasix, take if gains more than 3 pounds in 1 day or 5 pounds in 1 week.  Encouraged to stay hydrated.  Repeat BMET in 1 week

## 2022-12-27 NOTE — Anesthesia Procedure Notes (Signed)
Spinal  Patient location during procedure: OR Start time: 12/27/2022 9:50 AM Reason for block: surgical anesthesia Staffing Performed: resident/CRNA  Anesthesiologist: Janeece Riggers, MD Resident/CRNA: Gerald Leitz, CRNA Performed by: Gerald Leitz, CRNA Authorized by: Janeece Riggers, MD   Preanesthetic Checklist Completed: patient identified, IV checked, site marked, risks and benefits discussed, surgical consent, monitors and equipment checked, pre-op evaluation and timeout performed Spinal Block Patient position: sitting Prep: DuraPrep Patient monitoring: heart rate, continuous pulse ox, blood pressure and cardiac monitor Approach: midline Location: L3-4 Injection technique: single-shot Needle Needle type: Introducer and Pencan  Needle gauge: 24 G Needle length: 9 cm Assessment Sensory level: T4 Events: CSF return Additional Notes Negative paresthesia. Negative blood return. Positive free-flowing CSF. Expiration date of kit checked and confirmed. Patient tolerated procedure well, without complications.

## 2022-12-28 ENCOUNTER — Inpatient Hospital Stay (HOSPITAL_COMMUNITY)
Admission: EM | Admit: 2022-12-28 | Discharge: 2023-01-02 | DRG: 469 | Disposition: A | Discharge status: Skilled Nursing Facility | Payer: Medicare Other | Attending: Internal Medicine | Admitting: Internal Medicine

## 2022-12-28 ENCOUNTER — Other Ambulatory Visit: Payer: Self-pay

## 2022-12-28 ENCOUNTER — Emergency Department (HOSPITAL_COMMUNITY): Payer: Medicare Other

## 2022-12-28 DIAGNOSIS — M1612 Unilateral primary osteoarthritis, left hip: Principal | ICD-10-CM | POA: Diagnosis present

## 2022-12-28 DIAGNOSIS — Z66 Do not resuscitate: Secondary | ICD-10-CM | POA: Diagnosis present

## 2022-12-28 DIAGNOSIS — Z8546 Personal history of malignant neoplasm of prostate: Secondary | ICD-10-CM

## 2022-12-28 DIAGNOSIS — N179 Acute kidney failure, unspecified: Secondary | ICD-10-CM | POA: Diagnosis not present

## 2022-12-28 DIAGNOSIS — E785 Hyperlipidemia, unspecified: Secondary | ICD-10-CM | POA: Diagnosis present

## 2022-12-28 DIAGNOSIS — I10 Essential (primary) hypertension: Secondary | ICD-10-CM | POA: Diagnosis present

## 2022-12-28 DIAGNOSIS — T428X5A Adverse effect of antiparkinsonism drugs and other central muscle-tone depressants, initial encounter: Secondary | ICD-10-CM | POA: Diagnosis not present

## 2022-12-28 DIAGNOSIS — E876 Hypokalemia: Secondary | ICD-10-CM | POA: Diagnosis not present

## 2022-12-28 DIAGNOSIS — E872 Acidosis, unspecified: Secondary | ICD-10-CM | POA: Diagnosis present

## 2022-12-28 DIAGNOSIS — R55 Syncope and collapse: Secondary | ICD-10-CM

## 2022-12-28 DIAGNOSIS — G4700 Insomnia, unspecified: Secondary | ICD-10-CM | POA: Diagnosis present

## 2022-12-28 DIAGNOSIS — Z8249 Family history of ischemic heart disease and other diseases of the circulatory system: Secondary | ICD-10-CM

## 2022-12-28 DIAGNOSIS — Z886 Allergy status to analgesic agent status: Secondary | ICD-10-CM

## 2022-12-28 DIAGNOSIS — R6510 Systemic inflammatory response syndrome (SIRS) of non-infectious origin without acute organ dysfunction: Secondary | ICD-10-CM | POA: Diagnosis not present

## 2022-12-28 DIAGNOSIS — Z85820 Personal history of malignant melanoma of skin: Secondary | ICD-10-CM | POA: Diagnosis not present

## 2022-12-28 DIAGNOSIS — Z85828 Personal history of other malignant neoplasm of skin: Secondary | ICD-10-CM

## 2022-12-28 DIAGNOSIS — R4182 Altered mental status, unspecified: Secondary | ICD-10-CM | POA: Diagnosis present

## 2022-12-28 DIAGNOSIS — E860 Dehydration: Secondary | ICD-10-CM | POA: Diagnosis present

## 2022-12-28 DIAGNOSIS — E669 Obesity, unspecified: Secondary | ICD-10-CM | POA: Diagnosis present

## 2022-12-28 DIAGNOSIS — T502X5A Adverse effect of carbonic-anhydrase inhibitors, benzothiadiazides and other diuretics, initial encounter: Secondary | ICD-10-CM | POA: Diagnosis present

## 2022-12-28 DIAGNOSIS — E861 Hypovolemia: Secondary | ICD-10-CM | POA: Diagnosis present

## 2022-12-28 DIAGNOSIS — Z79899 Other long term (current) drug therapy: Secondary | ICD-10-CM

## 2022-12-28 DIAGNOSIS — Z823 Family history of stroke: Secondary | ICD-10-CM

## 2022-12-28 DIAGNOSIS — I952 Hypotension due to drugs: Secondary | ICD-10-CM | POA: Diagnosis not present

## 2022-12-28 DIAGNOSIS — I2510 Atherosclerotic heart disease of native coronary artery without angina pectoris: Secondary | ICD-10-CM | POA: Diagnosis present

## 2022-12-28 DIAGNOSIS — Z880 Allergy status to penicillin: Secondary | ICD-10-CM

## 2022-12-28 DIAGNOSIS — E8770 Fluid overload, unspecified: Secondary | ICD-10-CM | POA: Diagnosis present

## 2022-12-28 DIAGNOSIS — K5900 Constipation, unspecified: Secondary | ICD-10-CM | POA: Diagnosis not present

## 2022-12-28 DIAGNOSIS — I959 Hypotension, unspecified: Secondary | ICD-10-CM | POA: Diagnosis present

## 2022-12-28 DIAGNOSIS — Z885 Allergy status to narcotic agent status: Secondary | ICD-10-CM

## 2022-12-28 DIAGNOSIS — Z888 Allergy status to other drugs, medicaments and biological substances status: Secondary | ICD-10-CM | POA: Diagnosis not present

## 2022-12-28 DIAGNOSIS — Z981 Arthrodesis status: Secondary | ICD-10-CM

## 2022-12-28 DIAGNOSIS — G929 Unspecified toxic encephalopathy: Secondary | ICD-10-CM | POA: Diagnosis not present

## 2022-12-28 DIAGNOSIS — Z6835 Body mass index (BMI) 35.0-35.9, adult: Secondary | ICD-10-CM

## 2022-12-28 DIAGNOSIS — E871 Hypo-osmolality and hyponatremia: Secondary | ICD-10-CM | POA: Diagnosis present

## 2022-12-28 LAB — TYPE AND SCREEN
ABO/RH(D): O POS
Antibody Screen: NEGATIVE

## 2022-12-28 LAB — COMPREHENSIVE METABOLIC PANEL
ALT: 22 U/L (ref 0–44)
AST: 50 U/L — ABNORMAL HIGH (ref 15–41)
Albumin: 3.1 g/dL — ABNORMAL LOW (ref 3.5–5.0)
Alkaline Phosphatase: 45 U/L (ref 38–126)
Anion gap: 10 (ref 5–15)
BUN: 25 mg/dL — ABNORMAL HIGH (ref 8–23)
CO2: 30 mmol/L (ref 22–32)
Calcium: 9 mg/dL (ref 8.9–10.3)
Chloride: 94 mmol/L — ABNORMAL LOW (ref 98–111)
Creatinine, Ser: 1.23 mg/dL (ref 0.61–1.24)
GFR, Estimated: 60 mL/min — ABNORMAL LOW (ref >60)
Glucose, Bld: 127 mg/dL — ABNORMAL HIGH (ref 70–99)
Potassium: 4 mmol/L (ref 3.5–5.1)
Sodium: 134 mmol/L — ABNORMAL LOW (ref 135–145)
Total Bilirubin: 1.5 mg/dL — ABNORMAL HIGH (ref 0.3–1.2)
Total Protein: 6 g/dL — ABNORMAL LOW (ref 6.5–8.1)

## 2022-12-28 LAB — TROPONIN I (HIGH SENSITIVITY)
Troponin I (High Sensitivity): 9 ng/L (ref <18)
Troponin I (High Sensitivity): 6 ng/L (ref <18)

## 2022-12-28 LAB — URINALYSIS, ROUTINE W REFLEX MICROSCOPIC
Bilirubin Urine: NEGATIVE
Glucose, UA: NEGATIVE mg/dL
Hgb urine dipstick: NEGATIVE
Ketones, ur: NEGATIVE mg/dL
Leukocytes,Ua: NEGATIVE
Nitrite: NEGATIVE
Protein, ur: NEGATIVE mg/dL
Specific Gravity, Urine: 1.021 (ref 1.005–1.030)
pH: 5 (ref 5.0–8.0)

## 2022-12-28 LAB — CBC
HCT: 39.1 % (ref 39.0–52.0)
Hemoglobin: 13 g/dL (ref 13.0–17.0)
MCH: 31 pg (ref 26.0–34.0)
MCHC: 33.2 g/dL (ref 30.0–36.0)
MCV: 93.1 fL (ref 80.0–100.0)
Platelets: 289 10*3/uL (ref 150–400)
RBC: 4.2 MIL/uL — ABNORMAL LOW (ref 4.22–5.81)
RDW: 13.8 % (ref 11.5–15.5)
WBC: 13.4 10*3/uL — ABNORMAL HIGH (ref 4.0–10.5)
nRBC: 0 % (ref 0.0–0.2)

## 2022-12-28 LAB — CBG MONITORING, ED: Glucose-Capillary: 135 mg/dL — ABNORMAL HIGH (ref 70–99)

## 2022-12-28 LAB — LACTIC ACID, PLASMA
Lactic Acid, Venous: 2.5 mmol/L (ref 0.5–1.9)
Lactic Acid, Venous: 2 mmol/L (ref 0.5–1.9)

## 2022-12-28 LAB — D-DIMER, QUANTITATIVE: D-Dimer, Quant: 0.51 ug/mL-FEU — ABNORMAL HIGH (ref 0.00–0.50)

## 2022-12-28 MED ORDER — SODIUM CHLORIDE 0.9 % IV BOLUS (SEPSIS)
2000.0000 mL | Freq: Once | INTRAVENOUS | Status: AC
  Administered 2022-12-28: 2000 mL via INTRAVENOUS

## 2022-12-28 MED ORDER — VANCOMYCIN HCL IN DEXTROSE 1-5 GM/200ML-% IV SOLN: 1000.0000 mg | Freq: Once | INTRAVENOUS | Status: DC

## 2022-12-28 MED ORDER — EPINEPHRINE HCL 5 MG/250ML IV SOLN IN NS
0.5000 ug/min | INTRAVENOUS | Status: DC
  Administered 2022-12-28: 2 ug/min via INTRAVENOUS

## 2022-12-28 MED ORDER — MORPHINE SULFATE (PF) 4 MG/ML IV SOLN
4.0000 mg | Freq: Once | INTRAVENOUS | Status: AC
  Administered 2022-12-28: 4 mg via INTRAVENOUS
  Filled 2022-12-28: qty 1

## 2022-12-28 MED ORDER — VANCOMYCIN HCL 1250 MG/250ML IV SOLN: 1250.0000 mg | INTRAVENOUS | Status: DC

## 2022-12-28 MED ORDER — LACTATED RINGERS IV SOLN: INTRAVENOUS | Status: DC

## 2022-12-28 MED ORDER — SODIUM CHLORIDE 0.9 % IV BOLUS
1000.0000 mL | Freq: Once | INTRAVENOUS | Status: AC
  Administered 2022-12-28: 1000 mL via INTRAVENOUS

## 2022-12-28 MED ORDER — VANCOMYCIN HCL 2000 MG/400ML IV SOLN
2000.0000 mg | Freq: Once | INTRAVENOUS | Status: AC
  Administered 2022-12-28: 2000 mg via INTRAVENOUS
  Filled 2022-12-28: qty 400

## 2022-12-28 MED ORDER — SODIUM CHLORIDE 0.9 % IV SOLN: INTRAVENOUS | Status: DC

## 2022-12-28 MED ORDER — HYDROMORPHONE HCL 1 MG/ML IJ SOLN
0.5000 mg | INTRAMUSCULAR | Status: DC | PRN
  Administered 2022-12-29 (×2): 0.5 mg via INTRAVENOUS
  Filled 2022-12-28 (×2): qty 0.5

## 2022-12-28 MED ORDER — SODIUM CHLORIDE 0.9 % IV SOLN
2.0000 g | Freq: Once | INTRAVENOUS | Status: AC
  Administered 2022-12-28: 2 g via INTRAVENOUS
  Filled 2022-12-28: qty 10

## 2022-12-28 MED ORDER — SODIUM CHLORIDE 0.9 % IV SOLN
2.0000 g | Freq: Three times a day (TID) | INTRAVENOUS | Status: DC
  Administered 2022-12-29: 2 g via INTRAVENOUS
  Filled 2022-12-28 (×2): qty 10

## 2022-12-28 MED ORDER — METRONIDAZOLE 500 MG/100ML IV SOLN
500.0000 mg | Freq: Once | INTRAVENOUS | Status: AC
  Administered 2022-12-28: 500 mg via INTRAVENOUS
  Filled 2022-12-28: qty 100

## 2022-12-28 NOTE — Telephone Encounter (Signed)
Left message to call back  

## 2022-12-28 NOTE — Progress Notes (Addendum)
Pharmacy Antibiotic Note  Larry Elliott is a 77 y.o. male for which pharmacy has been consulted for vancomycin and aztreonam dosing for sepsis.  Patient with a history of HLD, diverticulosis, HTN, melanoma, prostate cancer, CAD, basal cell carcinoma. Patient presenting with LOC. Patient with total hip arthroplasty 3/5.  SCr 1.23 - 0.7 baseline WBC 13.4; LA 2.5; T 95.3; HR 78; RR 24 3/4 MRSA PCR negative  Plan: Aztreonam 2g q8h Metronidazole per MD Vancomycin 2000 mg once then 1250 mg q24hr (eAUC 477.9) unless change in renal function Trend WBC, Fever, Renal function F/u cultures, clinical course, WBC De-escalate when able Levels at steady state  Height: '5\' 10"'$  (177.8 cm) Weight: 113.4 kg (250 lb) IBW/kg (Calculated) : 73  Temp (24hrs), Avg:95.3 F (35.2 C), Min:95.3 F (35.2 C), Max:95.3 F (35.2 C)  Recent Labs  Lab 12/26/22 0930 12/28/22 1916  WBC 11.2* 13.4*  CREATININE 1.32* 1.23  LATICACIDVEN  --  2.5*    Estimated Creatinine Clearance: 61.4 mL/min (by C-G formula based on SCr of 1.23 mg/dL).    Allergies  Allergen Reactions   Ambien [Zolpidem]     Encephalopathy, admitted 04-2022, symptoms suspected to be from [place]   Aspirin Other (See Comments)    REACTION: swelling in face   Hydrocodone     Insomnia, mild SOB (w/o cough-wheezing)   Penicillins Other (See Comments)    REACTION: swelling in face  Did it involve swelling of the face/tongue/throat, SOB, or low BP? Yes Did it involve sudden or severe rash/hives, skin peeling, or any reaction on the inside of your mouth or nose? No Did you need to seek medical attention at a hospital or doctor's office? Yes When did it last happen?      [id number] played Hockey If all above answers are "NO", may proceed with cephalosporin use.   Microbiology results: Pending  Thank you for allowing pharmacy to be a part of this patient's care.  Lorelei Pont, PharmD, BCPS 12/28/2022 9:14 PM ED Clinical Pharmacist -   [phone]

## 2022-12-28 NOTE — Progress Notes (Signed)
Elink monitoring for the code sepsis protocol.  Milford Cage, RN

## 2022-12-28 NOTE — ED Provider Notes (Signed)
[facility] Provider Note   CSN: [id number] Arrival date & time: 12/28/22  1855     History  Chief Complaint  Patient presents with  . Loss of Consciousness    Larry Elliott is a 77 y.o. male.   Loss of Consciousness    Patient has a history of diverticulosis hyperlipidemia, hypertension, melanoma, prostate cancer, coronary artery disease, basal cell carcinoma who recently had a left total hip arthroplasty performed yesterday.  Patient had his surgery and was released same day.  Patient was at home this evening.  He took one of his muscle relaxant medications.  Patient suddenly became weak and felt as if he was going to pass out.  Patient denied having any trouble with any chest pain or shortness of breath.  He felt diaphoretic and short of breath at time of the event.  He was also somewhat confused.  EMS was called and they noted him initially to be hypotensive.  His blood pressure was in the 70s.  His oxygen saturation was diminished.  He was started on an IV fluid bolus and an epi drip.  His vital signs have all normalized at this point.  Patient states he is feeling fine now.  He denies having any chest pain before the event.  He is not having any now.  He denies any trouble to breathing.  He has not noticed any fevers or chills.  No vomiting or diarrhea.  Home Medications Prior to Admission medications   Medication Sig Start Date End Date Taking? Authorizing Provider  apixaban (ELIQUIS) 2.5 MG TABS tablet Take 1 tablet (2.5 mg total) by mouth 2 (two) times daily. 12/27/22   Loni Dolly, PA-C  atorvastatin (LIPITOR) 40 MG tablet Take 1 tablet (40 mg total) by mouth at bedtime. 07/20/22   Colon Branch, MD  enalapril (VASOTEC) 5 MG tablet Take 1 tablet (5 mg total) by mouth daily. 08/15/22   Colon Branch, MD  fenofibrate micronized (LOFIBRA) 134 MG capsule Take 1 capsule (134 mg total) by mouth daily before breakfast. 09/29/22   Colon Branch, MD   furosemide (LASIX) 20 MG tablet Take 1 tablet (20 mg total) by mouth daily. 10/21/22   Colon Branch, MD  oxyCODONE-acetaminophen (PERCOCET) 5-325 MG tablet Take 1-2 tablets by mouth every 6 (six) hours as needed for severe pain or moderate pain (post op pain). 12/27/22 12/27/23  Loni Dolly, PA-C  tiZANidine (ZANAFLEX) 4 MG tablet Take 1 tablet (4 mg total) by mouth every 6 (six) hours as needed for muscle spasms. 12/27/22 12/27/23  Loni Dolly, PA-C  traZODone (DESYREL) 100 MG tablet Take 100 mg by mouth at bedtime. 11/29/22   Colon Branch, MD      Allergies    Ambien [zolpidem], Aspirin, Hydrocodone, and Penicillins    Review of Systems   Review of Systems  Cardiovascular:  Positive for syncope.    Physical Exam Updated Vital Signs BP 123/62   Pulse 78   Temp 98.5 F (36.9 C) (Oral)   Resp (!) 24   Ht 1.778 m ('5\' 10"'$ )   Wt 113.4 kg   SpO2 93%   BMI 35.87 kg/m  Physical Exam Vitals and nursing note reviewed.  Constitutional:      Appearance: He is well-developed.  HENT:     Head: Normocephalic and atraumatic.     Right Ear: External ear normal.     Left Ear: External ear normal.  Eyes:  General: No scleral icterus.       Right eye: No discharge.        Left eye: No discharge.     Conjunctiva/sclera: Conjunctivae normal.  Neck:     Trachea: No tracheal deviation.  Cardiovascular:     Rate and Rhythm: Normal rate and regular rhythm.  Pulmonary:     Effort: Pulmonary effort is normal. No respiratory distress.     Breath sounds: Normal breath sounds. No stridor. No wheezing or rales.  Abdominal:     General: Bowel sounds are normal. There is no distension.     Palpations: Abdomen is soft.     Tenderness: There is no abdominal tenderness. There is no guarding or rebound.  Musculoskeletal:        General: No tenderness or deformity.     Cervical back: Neck supple.  Skin:    General: Skin is warm and dry.     Findings: No rash.     Comments: Skin slightly clammy, cool to  the touch  Neurological:     General: No focal deficit present.     Mental Status: He is alert.     Cranial Nerves: No cranial nerve deficit, dysarthria or facial asymmetry.     Sensory: No sensory deficit.     Motor: No abnormal muscle tone or seizure activity.     Coordination: Coordination normal.  Psychiatric:        Mood and Affect: Mood normal.     ED Results / Procedures / Treatments   Labs (all labs ordered are listed, but only abnormal results are displayed) Labs Reviewed  CBC - Abnormal; Notable for the following components:      Result Value   WBC 13.4 (*)    RBC 4.20 (*)    All other components within normal limits  COMPREHENSIVE METABOLIC PANEL - Abnormal; Notable for the following components:   Sodium 134 (*)    Chloride 94 (*)    Glucose, Bld 127 (*)    BUN 25 (*)    Total Protein 6.0 (*)    Albumin 3.1 (*)    AST 50 (*)    Total Bilirubin 1.5 (*)    GFR, Estimated 60 (*)    All other components within normal limits  URINALYSIS, ROUTINE W REFLEX MICROSCOPIC - Abnormal; Notable for the following components:   APPearance CLOUDY (*)    All other components within normal limits  LACTIC ACID, PLASMA - Abnormal; Notable for the following components:   Lactic Acid, Venous 2.5 (*)    All other components within normal limits  D-DIMER, QUANTITATIVE - Abnormal; Notable for the following components:   D-Dimer, Quant 0.51 (*)    All other components within normal limits  CBG MONITORING, ED - Abnormal; Notable for the following components:   Glucose-Capillary 135 (*)    All other components within normal limits  CULTURE, BLOOD (ROUTINE X 2)  CULTURE, BLOOD (ROUTINE X 2)  LACTIC ACID, PLASMA  TYPE AND SCREEN  TROPONIN I (HIGH SENSITIVITY)  TROPONIN I (HIGH SENSITIVITY)    EKG EKG Interpretation  Date/Time:  Wednesday December 28 2022 19:00:06 EST Ventricular Rate:  73 PR Interval:  164 QRS Duration: 97 QT Interval:  414 QTC Calculation: 457 R  Axis:   19 Text Interpretation: Sinus rhythm Abnormal R-wave progression, early transition No significant change since last tracing Confirmed by Dorie Rank [phone]) on 12/28/2022 7:03:54 PM  Radiology DG Chest Portable 1 View  Result Date: 12/28/2022 CLINICAL  DATA:  Loss of consciousness, hypotension EXAM: PORTABLE CHEST 1 VIEW COMPARISON:  11/24/2022 FINDINGS: Cardiomegaly. Chronic bibasilar opacities could reflect atelectasis or scarring. No effusions. No acute bony abnormality. IMPRESSION: No active disease.  Chronic bibasilar atelectasis or scarring. Electronically Signed   By: Rolm Baptise M.D.   On: 12/28/2022 19:28   DG HIP UNILAT WITH PELVIS 1V LEFT  Result Date: 12/27/2022 CLINICAL DATA:  Left hip arthroplasty EXAM: DG HIP (WITH OR WITHOUT PELVIS) 1V*L* intraoperative fluoroscopy COMPARISON:  None Available. FINDINGS: Two 2 fluoroscopic spot images submitted for review demonstrates left hip arthroplasty with Press-Fit components. Expected alignment. Imaging was obtained to aid in treatment. Please correlate with real-time fluoroscopy of 26.7 seconds. Cumulative air kerma 5.7960 mGy IMPRESSION: Intraoperative fluoroscopy Electronically Signed   By: Jill Side M.D.   On: 12/27/2022 11:16   DG C-Arm 1-60 Min-No Report  Result Date: 12/27/2022 Fluoroscopy was utilized by the requesting physician.  No radiographic interpretation.   DG C-Arm 1-60 Min-No Report  Result Date: 12/27/2022 Fluoroscopy was utilized by the requesting physician.  No radiographic interpretation.    Procedures Procedures    Medications Ordered in ED Medications  EPINEPHrine (ADRENALIN) 5 mg in NS 250 mL (0.02 mg/mL) premix infusion (0 mcg/min Intravenous Stopped 12/28/22 2115)  sodium chloride 0.9 % bolus 1,000 mL (0 mLs Intravenous Stopped 12/28/22 2012)    And  0.9 %  sodium chloride infusion ( Intravenous New Bag/Given 12/28/22 1930)  lactated ringers infusion (has no administration in time range)  metroNIDAZOLE  (FLAGYL) IVPB 500 mg (500 mg Intravenous New Bag/Given 12/28/22 2132)  vancomycin (VANCOREADY) IVPB 2000 mg/400 mL (2,000 mg Intravenous New Bag/Given 12/28/22 2202)  aztreonam (AZACTAM) 2 g in sodium chloride 0.9 % 100 mL IVPB (has no administration in time range)  vancomycin (VANCOREADY) IVPB 1250 mg/250 mL (has no administration in time range)  sodium chloride 0.9 % bolus 2,000 mL (2,000 mLs Intravenous New Bag/Given 12/28/22 2125)  aztreonam (AZACTAM) 2 g in sodium chloride 0.9 % 100 mL IVPB (0 g Intravenous Stopped 12/28/22 2159)    ED Course/ Medical Decision Making/ A&P Clinical Course as of 12/28/22 2314  Wed Dec 28, 2022  2110 Patient CBC does show leukocytosis.  D-dimer mildly elevated at 0.51 but within age-adjusted normal range.  Initial lactic acid level is elevated at 2.5 [JK]  2110 Comprehensive metabolic panel(!) BUN and creatinine elevated, similar to 2 days ago [JK]  2110 Being down to the 120s [JK]  2111 X-ray without acute findings [JK]  2216 She has been taken off all pressors.  Blood pressure remained stable in the [id number] systolic [JK]  [id number] Discussed with Dr. Nevada Crane regarding admission [JK]    Clinical Course User Index [JK] Dorie Rank, MD                             Medical Decision Making Frontal diagnosis includes but not limited to acute blood loss anemia, sepsis, pulmonary embolism, acute coronary syndrome  Problems Addressed: AKI (acute kidney injury) [facility]): acute illness or injury that poses a threat to life or bodily functions Hypotension, unspecified hypotension type: acute illness or injury that poses a threat to life or bodily functions Lactic acid acidosis: acute illness or injury that poses a threat to life or bodily functions Syncope, unspecified syncope type: acute illness or injury that poses a threat to life or bodily functions  Amount and/or Complexity of Data Reviewed Labs: ordered. Decision-making details  documented in ED Course. Radiology: ordered  and independent interpretation performed.  Risk Prescription drug management. Decision regarding hospitalization.   Patient was recently in the hospital for elective hip surgery.  Patient was recovering at home.  Family gave him a muscle relaxant and symptoms started after that.  Patient was noted to be hypotensive and was started on epinephrine drip by EMS.  Patient denied any fevers or chills but did have low temperature on arrival and presentation was concerning for the possibility of evolving sepsis.  Patient was treated with IV fluid resuscitation.  Eventually we were able to stop the epinephrine drip.  His blood pressure is now in the [id number] systolic with a diastolic of 62 without any pressor support.  His lab test did not show any signs of anemia although he does have leukocytosis and there is a bump in his creatinine.  Lactic acid level slightly elevated.  No signs of pneumonia on x-ray.  D-dimer is negative arguing against pulmonary embolism.  First troponin normal no signs to suggest acute coronary syndrome.  Patient is feeling better and remains asymptomatic.  I am concerned about the possibility of bacteremia with his hypotension and lactic acidosis.  He has been given empiric antibiotics.  Will repeat lactic acid level.  I will consult the medical service for admission.         Final Clinical Impression(s) / ED Diagnoses Final diagnoses:  Hypotension, unspecified hypotension type  AKI (acute kidney injury) ([place])  Lactic acid acidosis  Syncope, unspecified syncope type    Rx / DC Orders ED Discharge Orders     None         Dorie Rank, MD 12/28/22 2314

## 2022-12-28 NOTE — Progress Notes (Signed)
   12/28/22 1925  Spiritual Encounters  Type of Visit Initial  Care provided to: Pt and family  Conversation partners present during encounter Nurse  Referral source Family  Reason for visit Routine spiritual support  OnCall Visit Yes   Larry Elliott was looking for another family when he met family in the ED waiting room as they were arriving.  Chap saw Pt arriving in ED by EMS but pt was not a code.  Larry Elliott escorted family to consult room and took wife back to Pt.

## 2022-12-28 NOTE — H&P (Signed)
History and Physical  Larry Elliott [id number] DOB: [birth year] DOA: 12/28/2022  Referring physician: Dr. Tomi Bamberger by [place]. PCP: Colon Branch, MD  Outpatient Specialists: Orthopedic surgery. Patient coming from: Home.  Chief Complaint: Hypotension, lethargy.  HPI: Larry Elliott is a 77 y.o. male with medical history significant for prostate cancer, basal cell carcinoma, melanoma, coronary artery disease, hypertension, hyperlipidemia, status post left total hip arthroplasty done outpatient on 12/27/2022 by Dr. Rhona Raider, who presented to [facility] ED from home due to sudden onset hypotension and lethargy after receiving a muscle relaxant for pain.  His daughter called EMS.  Upon EMS arrival and assessment, the patient was severely hypotensive with SBP in the 70s.  He received IV fluid bolus and was started on epinephrine drip en route.  In the ED, due to concern for sepsis with unknown source, (leukocytosis and lactic acidosis), code sepsis was called.  The patient received additional IV fluid boluses per sepsis protocol.  Peripheral blood cultures x 2 were obtained and the patient was started on empiric IV antibiotics Aztreonam, IV vancomycin, and IV Flagyl.  UA was negative for pyuria.  Chest x-ray was nonacute.  TRH, hospitalist service, was asked to admit.  ED Course: Tmax 99.4.  BP 121/59, pulse of 81, respiratory 20, saturation 95% on 2 L lab studies remarkable for creatinine 1.23 with GFR 60.  Baseline creatinine 0.72.  Serum sodium 134.  WBC 13.4.  Hemoglobin 13.0.  Review of Systems: Review of systems as noted in the HPI. All other systems reviewed and are negative.   Past Medical History:  Diagnosis Date   Arthritis    BCC (basal cell carcinoma), leg    Right calf, L nape of neck at hairline   CAD (coronary artery disease)    CP, cath, non-obstructive   Diverticulosis    colon   Erectile dysfunction following radical prostatectomy    Hyperlipidemia    Hypertension    Insomnia     Melanoma ([place]) 2010   face, sees derm    Prostate CA ([place]) 07/2007   s/p robotic surgery   Spermatocele    Left   Past Surgical History:  Procedure Laterality Date   APPENDECTOMY     age 61   HERNIA REPAIR  2010   KNEE ARTHROSCOPY Left 1965   meniscal tear   PROSTATECTOMY  07/25/2007    robotic,  for prostate cancer     SHOULDER ARTHROSCOPY Left 07/09/2013   Dr Tamera Punt    TRANSFORAMINAL LUMBAR INTERBODY FUSION (TLIF) WITH PEDICLE SCREW FIXATION 1 LEVEL Right 12/04/2019   Procedure: RIGHT-SIDED TRANSFORAMINAL LUMBAR INTERBODY FUSION LUMBAR FOUR THROUGH FIVE WITH INSTRUMENTATION AND ALLOGRAFT;  Surgeon: Phylliss Bob, MD;  Location: [facility];  Service: Orthopedics;  Laterality: Right;    Social History:  reports that he has never smoked. He has never used smokeless tobacco. He reports current alcohol use of about 7.0 standard drinks of alcohol per week. He reports that he does not use drugs.   Allergies  Allergen Reactions   Ambien [Zolpidem]     Encephalopathy, admitted 04-2022, symptoms suspected to be from [place]   Aspirin Other (See Comments)    REACTION: swelling in face   Hydrocodone     Insomnia, mild SOB (w/o cough-wheezing)   Penicillins Other (See Comments)    REACTION: swelling in face  Did it involve swelling of the face/tongue/throat, SOB, or low BP? Yes Did it involve sudden or severe rash/hives, skin peeling, or any reaction on the inside  of your mouth or nose? No Did you need to seek medical attention at a hospital or doctor's office? Yes When did it last happen?      [id number] played Hockey If all above answers are "NO", may proceed with cephalosporin use.    Family History  Problem Relation Age of Onset   Coronary artery disease Father 62       dx age 55s   Stroke Mother    Hypertension Neg Hx    Diabetes Neg Hx    Colon cancer Neg Hx    Prostate cancer Neg Hx       Prior to Admission medications   Medication Sig Start Date End Date Taking?  Authorizing Provider  apixaban (ELIQUIS) 2.5 MG TABS tablet Take 1 tablet (2.5 mg total) by mouth 2 (two) times daily. 12/27/22  Yes Loni Dolly, PA-C  atorvastatin (LIPITOR) 40 MG tablet Take 1 tablet (40 mg total) by mouth at bedtime. 07/20/22  Yes Paz, Alda Berthold, MD  enalapril (VASOTEC) 5 MG tablet Take 1 tablet (5 mg total) by mouth daily. 08/15/22  Yes Paz, Alda Berthold, MD  fenofibrate micronized (LOFIBRA) 134 MG capsule Take 1 capsule (134 mg total) by mouth daily before breakfast. 09/29/22  Yes Paz, Alda Berthold, MD  furosemide (LASIX) 20 MG tablet Take 1 tablet (20 mg total) by mouth daily. 10/21/22  Yes Paz, Alda Berthold, MD  oxyCODONE-acetaminophen (PERCOCET) 5-325 MG tablet Take 1-2 tablets by mouth every 6 (six) hours as needed for severe pain or moderate pain (post op pain). 12/27/22 12/27/23 Yes Loni Dolly, PA-C  tiZANidine (ZANAFLEX) 4 MG tablet Take 1 tablet (4 mg total) by mouth every 6 (six) hours as needed for muscle spasms. 12/27/22 12/27/23 Yes Loni Dolly, PA-C  traZODone (DESYREL) 100 MG tablet Take 100 mg by mouth at bedtime. 11/29/22  Yes Colon Branch, MD    Physical Exam: BP 134/63   Pulse 86   Temp 98.5 F (36.9 C) (Oral)   Resp (!) 38   Ht '5\' 10"'$  (1.778 m)   Wt 113.4 kg   SpO2 94%   BMI 35.87 kg/m   General: 77 y.o. year-old male well developed well nourished in no acute distress.  Alert and oriented x3. Cardiovascular: Regular rate and rhythm with no rubs or gallops.  No thyromegaly or JVD noted.  No lower extremity edema. 2/4 pulses in all 4 extremities. Respiratory: Clear to auscultation with no wheezes or rales. Good inspiratory effort. Abdomen: Soft nontender nondistended with normal bowel sounds x4 quadrants. Muskuloskeletal: No cyanosis, clubbing or edema noted bilaterally Neuro: CN II-XII intact, strength, sensation, reflexes Skin: No ulcerative lesions noted or rashes. Psychiatry: Judgement and insight appear normal. Mood is appropriate for condition and setting          Labs  on Admission:  Basic Metabolic Panel: Recent Labs  Lab 12/26/22 0930 12/28/22 1916  NA 140 134*  K 3.0* 4.0  CL 93* 94*  CO2 32 30  GLUCOSE 120* 127*  BUN 24* 25*  CREATININE 1.32* 1.23  CALCIUM 12.1* 9.0   Liver Function Tests: Recent Labs  Lab 12/28/22 1916  AST 50*  ALT 22  ALKPHOS 45  BILITOT 1.5*  PROT 6.0*  ALBUMIN 3.1*   No results for input(s): "LIPASE", "AMYLASE" in the last 168 hours. No results for input(s): "AMMONIA" in the last 168 hours. CBC: Recent Labs  Lab 12/26/22 0930 12/28/22 1916  WBC 11.2* 13.4*  HGB 16.1 13.0  HCT 49.8 39.1  MCV 92.4 93.1  PLT 397 289   Cardiac Enzymes: No results for input(s): "CKTOTAL", "CKMB", "CKMBINDEX", "TROPONINI" in the last 168 hours.  BNP (last 3 results) Recent Labs    11/24/22 1050  BNP 19.7    ProBNP (last 3 results) Recent Labs    06/08/22 1357  PROBNP 14.0    CBG: Recent Labs  Lab 12/28/22 1922  GLUCAP 135*    Radiological Exams on Admission: DG Chest Portable 1 View  Result Date: 12/28/2022 CLINICAL DATA:  Loss of consciousness, hypotension EXAM: PORTABLE CHEST 1 VIEW COMPARISON:  11/24/2022 FINDINGS: Cardiomegaly. Chronic bibasilar opacities could reflect atelectasis or scarring. No effusions. No acute bony abnormality. IMPRESSION: No active disease.  Chronic bibasilar atelectasis or scarring. Electronically Signed   By: Rolm Baptise M.D.   On: 12/28/2022 19:28   DG HIP UNILAT WITH PELVIS 1V LEFT  Result Date: 12/27/2022 CLINICAL DATA:  Left hip arthroplasty EXAM: DG HIP (WITH OR WITHOUT PELVIS) 1V*L* intraoperative fluoroscopy COMPARISON:  None Available. FINDINGS: Two 2 fluoroscopic spot images submitted for review demonstrates left hip arthroplasty with Press-Fit components. Expected alignment. Imaging was obtained to aid in treatment. Please correlate with real-time fluoroscopy of 26.7 seconds. Cumulative air kerma 5.7960 mGy IMPRESSION: Intraoperative fluoroscopy Electronically Signed    By: Jill Side M.D.   On: 12/27/2022 11:16   DG C-Arm 1-60 Min-No Report  Result Date: 12/27/2022 Fluoroscopy was utilized by the requesting physician.  No radiographic interpretation.   DG C-Arm 1-60 Min-No Report  Result Date: 12/27/2022 Fluoroscopy was utilized by the requesting physician.  No radiographic interpretation.    EKG: I independently viewed the EKG done and my findings are as followed: Sinus rhythm of 73.  Nonspecific ST-T changes.  QTc 457  Assessment/Plan Present on Admission:  Hypotension  Principal Problem:   Hypotension  Hypotension, possible sepsis versus vasovagal. Received IV fluid bolus and was started on epinephrine drip en route by EMS.  Also received IV fluid boluses in the ED per sepsis protocol.  His blood pressure improved. Now off vasopressor. Continue to maintain MAP greater than 65. Continue to closely monitor on telemetry. Continue to closely monitor vital signs.  Sepsis of unknown source. Leukocytosis, lactic acidosis Continue empiric IV antibiotics until sepsis is ruled out. Follow peripheral blood cultures x 2.  Lethargy, presyncope, secondary to severe hypotension, hypovolemia. His mentation has returned to baseline Continue to treat underlying conditions Orthostatic vital signs and fall precautions in the morning. PT OT assessment in the morning  AKI, suspect prerenal in the setting of dehydration. Baseline creatinine appears to be 0.7 with GFR greater than 60 Presented with creatinine of 1.23 with GFR of 60. Avoid nephrotoxic agents, dehydration and hypotension. Monitor urine output. Repeat BMP in the morning.  Mild hypovolemic hyponatremia Serum sodium 134 NS at 50 cc/h x 1 day.  Use of DOAC, Eliquis Possibly use as DVT prophylaxis. On Eliquis 2.5 mg twice daily.   DVT prophylaxis: Home Eliquis.  Code Status: DNR per the patient himself.  Family Communication: Updated daughter at bedside.  Disposition Plan: Admitted  to progressive care unit  Consults called: None.  Admission status: Inpatient status.   Status is: Inpatient The patient requires at least 2 midnights for further evaluation and treatment of present condition.   Kayleen Memos MD [facility] Pager [phone]  If 7PM-7AM, please contact night-coverage [email or web address] Password [facility]  12/29/2022, 1:43 AM

## 2022-12-28 NOTE — ED Triage Notes (Signed)
Pt BIB EMS due to LOC and hypotension. Pt had left hip replacement yestertday at [facility]. Pts LSN was 1700, pt took muscle relaxer and around 1745 pt became confused, diaphoretic and SOB. Pt responsoive to verbal with EMS. Pt has apenic episodes with EMS. BO 70 systolic, started on epi gtt. 82% room air. Pt axox4.

## 2022-12-29 ENCOUNTER — Inpatient Hospital Stay (HOSPITAL_COMMUNITY): Payer: Medicare Other

## 2022-12-29 ENCOUNTER — Encounter (HOSPITAL_COMMUNITY): Payer: Self-pay | Admitting: Internal Medicine

## 2022-12-29 DIAGNOSIS — I959 Hypotension, unspecified: Secondary | ICD-10-CM | POA: Diagnosis not present

## 2022-12-29 LAB — BASIC METABOLIC PANEL
Anion gap: 9 (ref 5–15)
BUN: 14 mg/dL (ref 8–23)
CO2: 23 mmol/L (ref 22–32)
Calcium: 8.3 mg/dL — ABNORMAL LOW (ref 8.9–10.3)
Chloride: 105 mmol/L (ref 98–111)
Creatinine, Ser: 0.84 mg/dL (ref 0.61–1.24)
GFR, Estimated: >60 mL/min (ref >60)
Glucose, Bld: 108 mg/dL — ABNORMAL HIGH (ref 70–99)
Potassium: 3.4 mmol/L — ABNORMAL LOW (ref 3.5–5.1)
Sodium: 137 mmol/L (ref 135–145)

## 2022-12-29 LAB — BLOOD GAS, VENOUS
Acid-Base Excess: 4.9 mmol/L — ABNORMAL HIGH (ref 0.0–2.0)
Bicarbonate: 29.2 mmol/L — ABNORMAL HIGH (ref 20.0–28.0)
O2 Saturation: 96.1 %
Patient temperature: 37
pCO2, Ven: 41 mmHg — ABNORMAL LOW (ref 44–60)
pH, Ven: 7.46 — ABNORMAL HIGH (ref 7.25–7.43)
pO2, Ven: 76 mmHg — ABNORMAL HIGH (ref 32–45)

## 2022-12-29 LAB — MAGNESIUM: Magnesium: 2 mg/dL (ref 1.7–2.4)

## 2022-12-29 LAB — PHOSPHORUS: Phosphorus: 1.5 mg/dL — ABNORMAL LOW (ref 2.5–4.6)

## 2022-12-29 LAB — CBC
HCT: 34 % — ABNORMAL LOW (ref 39.0–52.0)
Hemoglobin: 11.5 g/dL — ABNORMAL LOW (ref 13.0–17.0)
MCH: 31 pg (ref 26.0–34.0)
MCHC: 33.8 g/dL (ref 30.0–36.0)
MCV: 91.6 fL (ref 80.0–100.0)
Platelets: 253 10*3/uL (ref 150–400)
RBC: 3.71 MIL/uL — ABNORMAL LOW (ref 4.22–5.81)
RDW: 13.9 % (ref 11.5–15.5)
WBC: 12.5 10*3/uL — ABNORMAL HIGH (ref 4.0–10.5)
nRBC: 0 % (ref 0.0–0.2)

## 2022-12-29 MED ORDER — APIXABAN 2.5 MG PO TABS
2.5000 mg | ORAL_TABLET | Freq: Two times a day (BID) | ORAL | Status: DC
  Administered 2022-12-29 – 2023-01-02 (×9): 2.5 mg via ORAL
  Filled 2022-12-29 (×9): qty 1

## 2022-12-29 MED ORDER — POLYETHYLENE GLYCOL 3350 17 G PO PACK
17.0000 g | PACK | Freq: Every day | ORAL | Status: DC | PRN
  Administered 2022-12-29: 17 g via ORAL
  Filled 2022-12-29: qty 1

## 2022-12-29 MED ORDER — PROCHLORPERAZINE EDISYLATE 10 MG/2ML IJ SOLN: 5.0000 mg | Freq: Four times a day (QID) | INTRAMUSCULAR | Status: DC | PRN

## 2022-12-29 MED ORDER — ATORVASTATIN CALCIUM 40 MG PO TABS
40.0000 mg | ORAL_TABLET | Freq: Every day | ORAL | Status: DC
  Administered 2022-12-29 – 2023-01-01 (×5): 40 mg via ORAL
  Filled 2022-12-29 (×5): qty 1

## 2022-12-29 MED ORDER — ORAL CARE MOUTH RINSE: 15.0000 mL | OROMUCOSAL | Status: DC | PRN

## 2022-12-29 MED ORDER — SENNOSIDES-DOCUSATE SODIUM 8.6-50 MG PO TABS
1.0000 | ORAL_TABLET | Freq: Two times a day (BID) | ORAL | Status: DC
  Administered 2022-12-29: 1 via ORAL
  Filled 2022-12-29 (×3): qty 1

## 2022-12-29 MED ORDER — TRAZODONE HCL 100 MG PO TABS
100.0000 mg | ORAL_TABLET | Freq: Every day | ORAL | Status: DC
  Administered 2022-12-29 – 2023-01-01 (×4): 100 mg via ORAL
  Filled 2022-12-29 (×4): qty 1

## 2022-12-29 MED ORDER — BISACODYL 10 MG RE SUPP
10.0000 mg | Freq: Every day | RECTAL | Status: DC | PRN
  Filled 2022-12-29: qty 1

## 2022-12-29 MED ORDER — OXYCODONE-ACETAMINOPHEN 5-325 MG PO TABS
1.0000 | ORAL_TABLET | ORAL | Status: DC | PRN
  Administered 2022-12-30 – 2023-01-01 (×8): 1 via ORAL
  Filled 2022-12-29 (×9): qty 1

## 2022-12-29 MED ORDER — POLYETHYLENE GLYCOL 3350 17 G PO PACK
17.0000 g | PACK | Freq: Every day | ORAL | Status: DC
  Filled 2022-12-29: qty 1

## 2022-12-29 MED ORDER — SODIUM CHLORIDE 0.9 % IV SOLN: INTRAVENOUS | Status: DC

## 2022-12-29 MED ORDER — K PHOS MONO-SOD PHOS DI & MONO 155-852-130 MG PO TABS: 500.0000 mg | ORAL_TABLET | Freq: Three times a day (TID) | ORAL | Status: DC

## 2022-12-29 MED ORDER — SIMETHICONE 80 MG PO CHEW
80.0000 mg | CHEWABLE_TABLET | Freq: Four times a day (QID) | ORAL | Status: DC
  Administered 2022-12-29 – 2022-12-30 (×2): 80 mg via ORAL
  Filled 2022-12-29 (×2): qty 1

## 2022-12-29 MED ORDER — ACETAMINOPHEN 325 MG PO TABS
650.0000 mg | ORAL_TABLET | Freq: Four times a day (QID) | ORAL | Status: DC | PRN
  Administered 2022-12-29 – 2023-01-02 (×5): 650 mg via ORAL
  Filled 2022-12-29 (×6): qty 2

## 2022-12-29 MED ORDER — MELATONIN 5 MG PO TABS
5.0000 mg | ORAL_TABLET | Freq: Every evening | ORAL | Status: DC | PRN
  Administered 2022-12-29: 5 mg via ORAL
  Filled 2022-12-29: qty 1

## 2022-12-29 MED ORDER — LACTATED RINGERS IV SOLN: INTRAVENOUS | Status: DC

## 2022-12-29 MED ORDER — FENOFIBRATE 160 MG PO TABS
160.0000 mg | ORAL_TABLET | Freq: Every day | ORAL | Status: DC
  Administered 2022-12-29: 160 mg via ORAL
  Filled 2022-12-29: qty 1

## 2022-12-29 MED ORDER — POTASSIUM PHOSPHATES 15 MMOLE/5ML IV SOLN
30.0000 mmol | Freq: Once | INTRAVENOUS | Status: AC
  Administered 2022-12-29: 30 mmol via INTRAVENOUS
  Filled 2022-12-29: qty 10

## 2022-12-29 MED ORDER — METRONIDAZOLE 500 MG/100ML IV SOLN
500.0000 mg | Freq: Two times a day (BID) | INTRAVENOUS | Status: DC
  Filled 2022-12-29: qty 100

## 2022-12-29 NOTE — Progress Notes (Signed)
Mobility Specialist: Progress Note   12/29/22 1617  Mobility  Activity Ambulated with assistance in hallway  Level of Assistance Minimal assist, patient does 75% or more  Assistive Device Front wheel walker  Distance Ambulated (ft) 110 ft  Activity Response Tolerated well  Mobility Referral Yes  $Mobility charge 1 Mobility   Post-Mobility: 93 HR  Pt received in sitting EOB and agreeable to mobility. MinA to stand and contact guard during ambulation. C/o 6-7/10 pain during ambulation, otherwise asymptomatic. Pt to the chair after session per request with call bell in reach. Chair alarm is on.   [facility] Mobility Specialist Please contact via SecureChat or Rehab office at [phone]

## 2022-12-29 NOTE — ED Notes (Signed)
Pt reported he "does not want life savings measures" if he was to code/cardiac arrest. Pt stated he does not want CPR or to be placed on a breathing machine. Dr. [place] Crane was notified of pt's decision.

## 2022-12-29 NOTE — Progress Notes (Cosign Needed)
  Transition of Care [facility]) Screening Note   Patient Details  Name: Larry Elliott Date of Birth: 03-30-1946   Transition of Care [facility] CM/SW Contact:    Pollie Friar, RN Phone Number: 12/29/2022, 3:24 PM   Pt is from home with spouse. Had hip surgery 12/27/22.  Transition of Care Department [facility]) has reviewed patient. We will continue to monitor patient advancement through interdisciplinary progression rounds. If new patient transition needs arise, please place a TOC consult.

## 2022-12-29 NOTE — ED Notes (Signed)
Pt placed on 2L [place] d/t decrease in sats while at rest, daughter reports unknown OSA, but pt has appt to be evaluated for sleep study.

## 2022-12-29 NOTE — Evaluation (Signed)
Occupational Therapy Evaluation Patient Details Name: Larry Elliott MRN: [id number] DOB: Feb 29, 1946 Today's Date: 12/29/2022   History of Present Illness Pt is a 77 y/o male presenting on 3/6 with hypotension and lethargy. Noted L THA (anterior approach) on 3/5 and dc'd home. PMH: CAD, diverticulosis, HLD, HTN, insomnia, melanoma, prostate CA   Clinical Impression   Patient admitted for above and presents with problem list below.  Prior to L THA, pt was independent and driving; using RW since surgery and needing assist for Adls.  Currently, completes transfers with min assist, Adls with up to mod assist.  Limited by fatigue, pain and recall.  Pt endorses decreased STM difficulties since surgery, short blessed test reveals questionable impairment in STM, attention and sequencing. Pt reports having good support from family at dc.  Will follow acutely to optimize independence and safety, but anticipate no further needs after dc home.      Recommendations for follow up therapy are one component of a multi-disciplinary discharge planning process, led by the attending physician.  Recommendations may be updated based on patient status, additional functional criteria and insurance authorization.   Follow Up Recommendations  No OT follow up     Assistance Recommended at Discharge Frequent or constant Supervision/Assistance  Patient can return home with the following A little help with walking and/or transfers;A lot of help with bathing/dressing/bathroom;Assistance with cooking/housework;Assist for transportation;Help with stairs or ramp for entrance;Direct supervision/assist for medications management;Direct supervision/assist for financial management    Functional Status Assessment  Patient has had a recent decline in their functional status and/or demonstrates limited ability to make significant improvements in function in a reasonable and predictable amount of time  Equipment Recommendations   None recommended by OT    Recommendations for Other Services       Precautions / Restrictions Precautions Precautions: Fall Precaution Comments: L THA (anterior approach) 3/5 Restrictions Weight Bearing Restrictions: No      Mobility Bed Mobility Overal bed mobility: Needs Assistance             General bed mobility comments: OOB in recliner upon entry    Transfers Overall transfer level: Needs assistance Equipment used: Rolling walker (2 wheels) Transfers: Sit to/from Stand Sit to Stand: Min guard           General transfer comment: cueing for hand placement, safety; using RW      Balance Overall balance assessment: Needs assistance Sitting-balance support: No upper extremity supported, Feet supported Sitting balance-Leahy Scale: Good     Standing balance support: Bilateral upper extremity supported, During functional activity, Reliant on assistive device for balance Standing balance-Leahy Scale: Poor Standing balance comment: Reliant on RW                           ADL either performed or assessed with clinical judgement   ADL Overall ADL's : Needs assistance/impaired     Grooming: Set up;Sitting           Upper Body Dressing : Set up;Sitting   Lower Body Dressing: Moderate assistance;Sit to/from stand   Toilet Transfer: Minimal assistance;Rolling walker (2 wheels);BSC/3in1           Functional mobility during ADLs: Minimal assistance;Rolling walker (2 wheels) General ADL Comments: pt fatigued and declined further mobilization during session     Vision         Perception     Praxis      Pertinent Vitals/Pain Pain Assessment  Pain Assessment: Faces Faces Pain Scale: Hurts even more Pain Location: L hip Pain Descriptors / Indicators: Discomfort, Grimacing, Operative site guarding Pain Intervention(s): Limited activity within patient's tolerance, Monitored during session, Repositioned     Hand Dominance Right    Extremity/Trunk Assessment Upper Extremity Assessment Upper Extremity Assessment: Overall WFL for tasks assessed   Lower Extremity Assessment Lower Extremity Assessment: Defer to PT evaluation LLE Deficits / Details: hip weakness, MMT </= 2/5 for hip flexion, likely related to hip pain from anterior approach THA 3/5; denies numbness/tingling bil; MMT scores of 5 knee extension and ankle dorsiflexion bil LLE Sensation: WNL LLE Coordination: decreased gross motor   Cervical / Trunk Assessment Cervical / Trunk Assessment: Normal   Communication Communication Communication: HOH   Cognition Arousal/Alertness: Awake/alert Behavior During Therapy: WFL for tasks assessed/performed Overall Cognitive Status: Impaired/Different from baseline Area of Impairment: Attention, Memory, Problem solving                   Current Attention Level: Sustained Memory: Decreased short-term memory       Problem Solving: Slow processing, Difficulty sequencing, Requires verbal cues General Comments: patient demonstrating diffuclty with STM, attention and sequencing on short blessed test scoring 8/28 questionable impairment. Pt endorses difficutly with STM since surgery that is new.     General Comments  SpO2 >/= 91% on RA throughout; Orthostatics - 142/66 & 78 bpm supine, 144/63 & 82 bpm sitting, 143/73 & 88 bpm standing, 144/79 & 86 bpm standing ~3 min    Exercises     Shoulder Instructions      Home Living Family/patient expects to be discharged to:: Private residence Living Arrangements: Spouse/significant other;Children (son and daughter) Available Help at Discharge: Family;Available 24 hours/day Type of Home: House Home Access: Stairs to enter [employer] of Steps: 2 Entrance Stairs-Rails: Can reach both Home Layout: One level     Bathroom Shower/Tub: Occupational psychologist: Standard Bathroom Accessibility: Yes   Home Equipment: Conservation officer, nature (2  wheels);BSC/3in1;Shower seat;Grab bars - tub/shower;Grab bars - toilet          Prior Functioning/Environment Prior Level of Function : Independent/Modified Independent             Mobility Comments: IND NO AD prior to THA; using RW since THA ADLs Comments: independent, driving and managing meds/finances        OT Problem List: Decreased strength;Decreased activity tolerance;Impaired balance (sitting and/or standing);Decreased cognition;Decreased safety awareness;Decreased knowledge of use of DME or AE;Decreased knowledge of precautions;Pain      OT Treatment/Interventions: Self-care/ADL training;Therapeutic exercise;DME and/or AE instruction;Therapeutic activities;Cognitive remediation/compensation;Patient/family education;Balance training    OT Goals(Current goals can be found in the care plan section) Acute Rehab OT Goals Patient Stated Goal: home OT Goal Formulation: With patient Time For Goal Achievement: 01/12/23 Potential to Achieve Goals: Good  OT Frequency: Min 2X/week    Co-evaluation              AM-PAC OT "6 Clicks" Daily Activity     Outcome Measure Help from another person eating meals?: None Help from another person taking care of personal grooming?: A Little Help from another person toileting, which includes using toliet, bedpan, or urinal?: A Lot Help from another person bathing (including washing, rinsing, drying)?: A Lot Help from another person to put on and taking off regular upper body clothing?: A Little Help from another person to put on and taking off regular lower body clothing?: A Lot 6 Click Score: 16  End of Session Equipment Utilized During Treatment: Gait belt;Rolling walker (2 wheels) Nurse Communication: Mobility status  Activity Tolerance: Patient tolerated treatment well Patient left: in chair;with call bell/phone within reach;with chair alarm set  OT Visit Diagnosis: Other abnormalities of gait and mobility (R26.89);Muscle  weakness (generalized) (M62.81);Other symptoms and signs involving cognitive function                Time: [id number] OT Time Calculation (min): 15 min Charges:  OT General Charges $OT Visit: 1 Visit OT Evaluation $OT Eval Low Complexity: [place], OT Acute Rehabilitation Services Office [phone]   Delight Stare 12/29/2022, 11:02 AM

## 2022-12-29 NOTE — Progress Notes (Signed)
[facility] Progress Note Patient: Larry Elliott [id number] DOB: 04-Jun-1946 DOA: 12/28/2022  DOS: the patient was seen and examined on 12/29/2022  Brief hospital course: Larry Elliott is a 77 y.o. male with medical history significant for prostate cancer, basal cell carcinoma, melanoma, coronary artery disease, hypertension, hyperlipidemia, status post left total hip arthroplasty done outpatient on 12/27/2022 by Dr. Rhona Raider, who presented to [facility] ED from home due to sudden onset hypotension and lethargy after receiving a muscle relaxant for pain.  Suspect this is medication side effect. Assessment and Plan: Hypotension Secondary to medication side effect. Received IV fluid bolus and was started on epinephrine drip en route by EMS.  Also received IV fluid boluses in the ED per sepsis protocol.  His blood pressure improved. Now off vasopressor. Blood pressure is stable. Will monitor.   Sepsis ruled out SIRS. Present on admission secondary to leukocytosis and hypotension but most likely secondary to medication side effect. Discontinue antibiotics. Monitor. Follow-up on cultures.   Lethargy, presyncope, secondary to severe hypotension, hypovolemia. His mentation has returned to baseline Continue to treat underlying conditions Orthostatic vital signs normal. PT OT recommends outpatient PT.  AKI, suspect prerenal in the setting of dehydration. Baseline creatinine appears to be 0.7 with GFR greater than 60 Presented with creatinine of 1.23 with GFR of 60. Avoid nephrotoxic agents, dehydration and hypotension. Monitor urine output. Repeat BMP in the morning.   Mild hypovolemic hyponatremia Corrected.  Monitor.   Use of DOAC, Eliquis Possibly use as DVT prophylaxis. On Eliquis 2.5 mg twice daily.  Recent hip surgery. Notified orthopedics about patient presentation. Will monitor.   Subjective: No nausea no vomiting no fever no chills.  No dizziness or lightheadedness.   Reports constipation.  Physical Exam: General: in Mild distress, No Rash Cardiovascular: S1 and S2 Present, No Murmur Respiratory: Good respiratory effort, Bilateral Air entry present. No Crackles, No wheezes Abdomen: Bowel Sound present, No tenderness Extremities: No edema Neuro: Alert and oriented x3, no new focal deficit  Data Reviewed: I have Reviewed nursing notes, Vitals, and Lab results. Since last encounter, pertinent lab results CBC and BMP   . I have ordered test including CBC and BMP  .  Still here Disposition: Status is: Inpatient Remains inpatient appropriate because: need for monitoring.   apixaban (ELIQUIS) tablet 2.5 mg Start: 12/29/22 1000 apixaban (ELIQUIS) tablet 2.5 mg   Family Communication: daughter at bedside  Level of care: Progressive   Vitals:   12/29/22 0500 12/29/22 0749 12/29/22 0853 12/29/22 1622  BP:  125/66  (!) 156/72  Pulse:  81  92  Resp:  18  16  Temp:  98 F (36.7 C)  98.5 F (36.9 C)  TempSrc:  Oral  Oral  SpO2:  96% 92% 93%  Weight: 115.1 kg     Height:         Author: Berle Mull, MD 12/29/2022 6:35 PM  Please look on [email or web address] to find out who is on call.

## 2022-12-29 NOTE — ED Notes (Signed)
Called to inform inpatient unit that it has been 40 mins since pt has had a ready bed, handoff report has been documented per policy. Was advised that nurse and unit was unaware of receiving a patient, will need to review chart and nurse to call back. ED Charge RN notified.

## 2022-12-29 NOTE — Telephone Encounter (Signed)
Current admission 

## 2022-12-29 NOTE — ED Notes (Signed)
Receiving RN aware pt is on his way to inpatient, Mortimer Fries, RN to transport pt at this time

## 2022-12-29 NOTE — ED Notes (Signed)
ED TO INPATIENT HANDOFF REPORT  ED Nurse Name and Phone #: Lawernce Pitts [phone]  S Name/Age/Gender Larry Elliott 77 y.o. male Room/Bed: TRACC/TRACC  Code Status   Code Status: Full Code  Home/SNF/Other Home Patient oriented to: self, place, and situation Is this baseline? Yes   Triage Complete: Triage complete  Chief Complaint Hypotension [I95.9]  Triage Note Pt BIB EMS due to LOC and hypotension. Pt had left hip replacement yestertday at [facility]. Pts LSN was 1700, pt took muscle relaxer and around 1745 pt became confused, diaphoretic and SOB. Pt responsoive to verbal with EMS. Pt has apenic episodes with EMS. BO 70 systolic, started on epi gtt. 82% room air. Pt axox4.     Allergies Allergies  Allergen Reactions   Ambien [Zolpidem]     Encephalopathy, admitted 04-2022, symptoms suspected to be from [place]   Aspirin Other (See Comments)    REACTION: swelling in face   Hydrocodone     Insomnia, mild SOB (w/o cough-wheezing)   Penicillins Other (See Comments)    REACTION: swelling in face  Did it involve swelling of the face/tongue/throat, SOB, or low BP? Yes Did it involve sudden or severe rash/hives, skin peeling, or any reaction on the inside of your mouth or nose? No Did you need to seek medical attention at a hospital or doctor's office? Yes When did it last happen?      [id number] played Hockey If all above answers are "NO", may proceed with cephalosporin use.    Level of Care/Admitting Diagnosis ED Disposition     ED Disposition  Admit   Condition  --   [place]: [facility] [100100]  Level of Care: Progressive [102]  Admit to Progressive based on following criteria: MULTISYSTEM THREATS such as stable sepsis, metabolic/electrolyte imbalance with or without encephalopathy that is responding to early treatment.  May admit patient to [facility] or [facility] if equivalent level of care is available:: No  Covid Evaluation:  Asymptomatic - no recent exposure (last 10 days) testing not required  Diagnosis: Hypotension [id number]  Admitting Physician: Kayleen Memos [id number]  Attending Physician: Kayleen Memos [id number]  Certification:: I certify this patient will need inpatient services for at least 2 midnights  Estimated Length of Stay: 2          B Medical/Surgery History Past Medical History:  Diagnosis Date   Arthritis    BCC (basal cell carcinoma), leg    Right calf, L nape of neck at hairline   CAD (coronary artery disease)    CP, cath, non-obstructive   Diverticulosis    colon   Erectile dysfunction following radical prostatectomy    Hyperlipidemia    Hypertension    Insomnia    Melanoma ([place]) 2010   face, sees derm    Prostate CA ([place]) 07/2007   s/p robotic surgery   Spermatocele    Left   Past Surgical History:  Procedure Laterality Date   APPENDECTOMY     age 57   HERNIA REPAIR  2010   KNEE ARTHROSCOPY Left 1965   meniscal tear   PROSTATECTOMY  07/25/2007    robotic,  for prostate cancer     SHOULDER ARTHROSCOPY Left 07/09/2013   Dr Tamera Punt    TRANSFORAMINAL LUMBAR INTERBODY FUSION (TLIF) WITH PEDICLE SCREW FIXATION 1 LEVEL Right 12/04/2019   Procedure: RIGHT-SIDED TRANSFORAMINAL LUMBAR INTERBODY FUSION LUMBAR FOUR THROUGH FIVE WITH INSTRUMENTATION AND ALLOGRAFT;  Surgeon: Phylliss Bob, MD;  Location: [place];  Service: Orthopedics;  Laterality: Right;     A IV Location/Drains/Wounds Patient Lines/Drains/Airways Status     Active Line/Drains/Airways     Name Placement date Placement time Site Days   Peripheral IV 12/28/22 20 G Anterior;Left Hand 12/28/22  1902  Hand  1   Peripheral IV 12/28/22 20 G Anterior;Distal;Right;Upper Arm 12/28/22  1910  Arm  1   External Urinary Catheter 12/28/22  2111  --  1   Airway 12/27/22  0858  -- 2            Intake/Output Last 24 hours  Intake/Output Summary (Last 24 hours) at 12/29/2022 0252 Last data filed at 12/29/2022  0159 Gross per 24 hour  Intake 4906.08 ml  Output --  Net 4906.08 ml    Labs/Imaging Results for orders placed or performed during the hospital encounter of 12/28/22 (from the past 48 hour(s))  Troponin I (High Sensitivity)     Status: None   Collection Time: 12/28/22  7:16 PM  Result Value Ref Range   Troponin I (High Sensitivity) 9 <18 ng/L    Comment: (NOTE) Elevated high sensitivity troponin I (hsTnI) values and significant  changes across serial measurements may suggest ACS but many other  chronic and acute conditions are known to elevate hsTnI results.  Refer to the "Links" section for chest pain algorithms and additional  guidance. Performed at [facility], [place] [address] [place], [place] [address]   CBC     Status: Abnormal   Collection Time: 12/28/22  7:16 PM  Result Value Ref Range   WBC 13.4 (H) 4.0 - 10.5 K/uL   RBC 4.20 (L) 4.22 - 5.81 MIL/uL   Hemoglobin 13.0 13.0 - 17.0 g/dL   HCT 39.1 39.0 - 52.0 %   MCV 93.1 80.0 - 100.0 fL   MCH 31.0 26.0 - 34.0 pg   MCHC 33.2 30.0 - 36.0 g/dL   RDW 13.8 11.5 - 15.5 %   Platelets 289 150 - 400 K/uL   nRBC 0.0 0.0 - 0.2 %    Comment: Performed at [facility], [place] [address], [place], [place] [address]  Comprehensive metabolic panel     Status: Abnormal   Collection Time: 12/28/22  7:16 PM  Result Value Ref Range   Sodium 134 (L) 135 - 145 mmol/L   Potassium 4.0 3.5 - 5.1 mmol/L    Comment: HEMOLYSIS AT THIS LEVEL MAY AFFECT RESULT   Chloride 94 (L) 98 - 111 mmol/L   CO2 30 22 - 32 mmol/L   Glucose, Bld 127 (H) 70 - 99 mg/dL    Comment: Glucose reference range applies only to samples taken after fasting for at least 8 hours.   BUN 25 (H) 8 - 23 mg/dL   Creatinine, Ser 1.23 0.61 - 1.24 mg/dL   Calcium 9.0 8.9 - 10.3 mg/dL   Total Protein 6.0 (L) 6.5 - 8.1 g/dL   Albumin 3.1 (L) 3.5 - 5.0 g/dL   AST 50 (H) 15 - 41 U/L   ALT 22 0 - 44 U/L   Alkaline Phosphatase 45 38 - 126 U/L   Total Bilirubin 1.5 (H)  0.3 - 1.2 mg/dL   GFR, Estimated 60 (L) >60 mL/min    Comment: (NOTE) Calculated using the CKD-EPI Creatinine Equation (2021)    Anion gap 10 5 - 15    Comment: Performed at [facility], [place] [address] [place], [place] [address]  Lactic acid, plasma  Status: Abnormal   Collection Time: 12/28/22  7:16 PM  Result Value Ref Range   Lactic Acid, Venous 2.5 (HH) 0.5 - 1.9 mmol/L    Comment: CRITICAL RESULT CALLED TO, READ BACK BY AND VERIFIED WITH A Dariya Gainer,RN 2038 12/28/22 WBOND Performed at [facility], [place] [address], [place], [place] [address]   D-dimer, quantitative     Status: Abnormal   Collection Time: 12/28/22  7:16 PM  Result Value Ref Range   D-Dimer, Quant 0.51 (H) 0.00 - 0.50 ug/mL-FEU    Comment: (NOTE) At the manufacturer cut-off value of 0.5 g/mL FEU, this assay has a negative predictive value of 95-100%.This assay is intended for use in conjunction with a clinical pretest probability (PTP) assessment model to exclude pulmonary embolism (PE) and deep venous thrombosis (DVT) in outpatients suspected of PE or DVT. Results should be correlated with clinical presentation. Performed at [facility], [place] [address], [place], [place] [address]   Type and screen [place]     Status: None   Collection Time: 12/28/22  7:16 PM  Result Value Ref Range   ABO/RH(D) O POS    Antibody Screen NEG    Sample Expiration      12/31/2022,2359 Performed at [facility], [place] [address] [place], [place] [address]   CBG monitoring, ED     Status: Abnormal   Collection Time: 12/28/22  7:22 PM  Result Value Ref Range   Glucose-Capillary 135 (H) 70 - 99 mg/dL    Comment: Glucose reference range applies only to samples taken after fasting for at least 8 hours.  Troponin I (High Sensitivity)     Status: None   Collection Time: 12/28/22  9:02 PM  Result Value Ref Range   Troponin I (High Sensitivity) 6 <18 ng/L    Comment: (NOTE) Elevated high  sensitivity troponin I (hsTnI) values and significant  changes across serial measurements may suggest ACS but many other  chronic and acute conditions are known to elevate hsTnI results.  Refer to the "Links" section for chest pain algorithms and additional  guidance. Performed at [facility], [place] [address], [place], [place] [address]   Urinalysis, Routine w reflex microscopic -Urine, Clean Catch     Status: Abnormal   Collection Time: 12/28/22  9:59 PM  Result Value Ref Range   Color, Urine YELLOW YELLOW   APPearance CLOUDY (A) CLEAR   Specific Gravity, Urine 1.021 1.005 - 1.030   pH 5.0 5.0 - 8.0   Glucose, UA NEGATIVE NEGATIVE mg/dL   Hgb urine dipstick NEGATIVE NEGATIVE   Bilirubin Urine NEGATIVE NEGATIVE   Ketones, ur NEGATIVE NEGATIVE mg/dL   Protein, ur NEGATIVE NEGATIVE mg/dL   Nitrite NEGATIVE NEGATIVE   Leukocytes,Ua NEGATIVE NEGATIVE    Comment: Performed at [facility] [address], [place], [place] [address]  Lactic acid, plasma     Status: Abnormal   Collection Time: 12/28/22 10:47 PM  Result Value Ref Range   Lactic Acid, Venous 2.0 (HH) 0.5 - 1.9 mmol/L    Comment: CRITICAL VALUE NOTED. VALUE IS CONSISTENT WITH PREVIOUSLY REPORTED/CALLED VALUE Performed at [facility], [place] [address] [place],  [address]   Blood gas, venous     Status: Abnormal   Collection Time: 12/29/22  1:49 AM  Result Value Ref Range   pH, Ven 7.46 (H) 7.25 - 7.43   pCO2, Ven 41 (L) 44 - 60 mmHg  pO2, Ven 76 (H) 32 - 45 mmHg   Bicarbonate 29.2 (H) 20.0 - 28.0 mmol/L   Acid-Base Excess 4.9 (H) 0.0 - 2.0 mmol/L   O2 Saturation 96.1 %   Patient temperature 37.0     Comment: Performed at [facility] [address], [place], [place] [address]   DG Chest Portable 1 View  Result Date: 12/28/2022 CLINICAL DATA:  Loss of consciousness, hypotension EXAM: PORTABLE CHEST 1 VIEW COMPARISON:  11/24/2022 FINDINGS: Cardiomegaly. Chronic bibasilar opacities could  reflect atelectasis or scarring. No effusions. No acute bony abnormality. IMPRESSION: No active disease.  Chronic bibasilar atelectasis or scarring. Electronically Signed   By: Rolm Baptise M.D.   On: 12/28/2022 19:28   DG HIP UNILAT WITH PELVIS 1V LEFT  Result Date: 12/27/2022 CLINICAL DATA:  Left hip arthroplasty EXAM: DG HIP (WITH OR WITHOUT PELVIS) 1V*L* intraoperative fluoroscopy COMPARISON:  None Available. FINDINGS: Two 2 fluoroscopic spot images submitted for review demonstrates left hip arthroplasty with Press-Fit components. Expected alignment. Imaging was obtained to aid in treatment. Please correlate with real-time fluoroscopy of 26.7 seconds. Cumulative air kerma 5.7960 mGy IMPRESSION: Intraoperative fluoroscopy Electronically Signed   By: Jill Side M.D.   On: 12/27/2022 11:16   DG C-Arm 1-60 Min-No Report  Result Date: 12/27/2022 Fluoroscopy was utilized by the requesting physician.  No radiographic interpretation.   DG C-Arm 1-60 Min-No Report  Result Date: 12/27/2022 Fluoroscopy was utilized by the requesting physician.  No radiographic interpretation.    Pending Labs Unresulted Labs (From admission, onward)     Start     Ordered   12/29/22 0500  CBC  Tomorrow morning,   R        12/29/22 0130   12/29/22 [id number]  Basic metabolic panel  Tomorrow morning,   R        12/29/22 0130   12/29/22 0500  Magnesium  Tomorrow morning,   R        12/29/22 0130   12/29/22 0500  Phosphorus  Tomorrow morning,   R        12/29/22 0130   12/28/22 1903  Blood culture (routine x 2)  BLOOD CULTURE X 2,   R (with STAT occurrences)      12/28/22 1903            Vitals/Pain Today's Vitals   12/29/22 0000 12/29/22 0030 12/29/22 0100 12/29/22 0151  BP: (!) 115/53 (!) 119/55 134/63   Pulse: 80 82 86   Resp: (!) 30 (!) 27 (!) 38   Temp:    99.4 F (37.4 C)  TempSrc:    Oral  SpO2: 90% 93% 94%   Weight:      Height:      PainSc:        Isolation Precautions No active  isolations  Medications Medications  aztreonam (AZACTAM) 2 g in sodium chloride 0.9 % 100 mL IVPB (has no administration in time range)  vancomycin (VANCOREADY) IVPB 1250 mg/250 mL (has no administration in time range)  HYDROmorphone (DILAUDID) injection 0.5 mg (has no administration in time range)  apixaban (ELIQUIS) tablet 2.5 mg (has no administration in time range)  atorvastatin (LIPITOR) tablet 40 mg (40 mg Oral Given 12/29/22 0207)  fenofibrate tablet 160 mg (has no administration in time range)  acetaminophen (TYLENOL) tablet 650 mg (has no administration in time range)  prochlorperazine (COMPAZINE) injection 5 mg (has no administration in time range)  polyethylene glycol (MIRALAX / GLYCOLAX) packet 17 g (has  no administration in time range)  melatonin tablet 5 mg (has no administration in time range)  metroNIDAZOLE (FLAGYL) IVPB 500 mg (has no administration in time range)  sodium chloride 0.9 % bolus 1,000 mL (0 mLs Intravenous Stopped 12/28/22 2012)    And  0.9 %  sodium chloride infusion ( Intravenous Rate/Dose Verify 12/29/22 0159)  sodium chloride 0.9 % bolus 2,000 mL (0 mLs Intravenous Stopped 12/28/22 2246)  aztreonam (AZACTAM) 2 g in sodium chloride 0.9 % 100 mL IVPB (0 g Intravenous Stopped 12/28/22 2159)  metroNIDAZOLE (FLAGYL) IVPB 500 mg (0 mg Intravenous Stopped 12/28/22 2234)  vancomycin (VANCOREADY) IVPB 2000 mg/400 mL (0 mg Intravenous Stopped 12/29/22 0012)  morphine (PF) 4 MG/ML injection 4 mg (4 mg Intravenous Given 12/28/22 2320)    Mobility Pt recently with left sx 12/26/2022, will need assistance with ambualtion     R Recommendations: See Admitting Provider Note  Report given to:   Additional Notes: Pt would prefer to be a DNR, Dr. Nevada Crane aware, pt is A&O x3, left hip surgery, pt is on 2L [place] d/t decrease sats while sleeping, pt is suppose to have a sleep study in future for evaluation of OSA per daughter. Pt is a hard stick, has two Ivs that are documented

## 2022-12-29 NOTE — Evaluation (Signed)
Physical Therapy Evaluation Patient Details Name: Larry Elliott MRN: [id number] DOB: 1946-09-09 Today's Date: 12/29/2022  History of Present Illness  Pt is a 77 y/o male presenting on 3/6 with hypotension and lethargy. Noted L THA (anterior approach) on 3/5 and dc'd home. PMH: CAD, diverticulosis, HLD, HTN, insomnia, melanoma, prostate CA   Clinical Impression  Pt presents with condition above and deficits mentioned below, see PT Problem List. Prior to his L THA on 3/5, he was independent without AD, but since then has been using a RW. He lives with his wife, son, and daughter in a 1-level house with 2 STE. Pt currently demonstrates deficits in L hip strength and ROM, limited by his L hip pain from the THA. In addition, pt displays deficits in his STM, balance, and activity tolerance. It is unclear if pt has some memory deficits at baseline or not as no family was present to confirm. Pt required minA to manage his L leg with bed mobility, but otherwise was able to transfer to stand and ambulate household distances with a RW at a min guard assist level. He needs reminders for proper, safe hand placement with transfers. Pt denied any dizziness/lightheadedness throughout the session, see vitals below. Will continue to follow acutely. Recommending follow-up with OPPT upon d/c home with family support.      SpO2 >/= 91% on RA throughout  Orthostatics -  142/66 & 78 bpm supine 144/63 & 82 bpm sitting 143/73 & 88 bpm standing 144/79 & 86 bpm standing ~3 min     Recommendations for follow up therapy are one component of a multi-disciplinary discharge planning process, led by the attending physician.  Recommendations may be updated based on patient status, additional functional criteria and insurance authorization.  Follow Up Recommendations Outpatient PT      Assistance Recommended at Discharge Frequent or constant Supervision/Assistance  Patient can return home with the following  A little  help with walking and/or transfers;A little help with bathing/dressing/bathroom;Assistance with cooking/housework;Assist for transportation;Help with stairs or ramp for entrance;Direct supervision/assist for medications management;Direct supervision/assist for financial management    Equipment Recommendations None recommended by PT (has all needed DME)  Recommendations for Other Services       Functional Status Assessment Patient has had a recent decline in their functional status and demonstrates the ability to make significant improvements in function in a reasonable and predictable amount of time.     Precautions / Restrictions Precautions Precautions: Fall Precaution Comments: L THA (anterior approach) 3/5 Restrictions Weight Bearing Restrictions: No      Mobility  Bed Mobility Overal bed mobility: Needs Assistance Bed Mobility: Supine to Sit, Sit to Supine     Supine to sit: Min assist, HOB elevated Sit to supine: Min assist, HOB elevated   General bed mobility comments: MinA to manage his L leg on and off bed    Transfers Overall transfer level: Needs assistance Equipment used: Rolling walker (2 wheels) Transfers: Sit to/from Stand Sit to Stand: Min guard           General transfer comment: Pt initially pulling up on RW to stand from EOB, needing cues to sit back down and place hands on bed for safer, more steady transfer to stand, success noted. Min guard assist for safety    Ambulation/Gait Ambulation/Gait assistance: Min guard Gait Distance (Feet): 43 Feet Assistive device: Rolling walker (2 wheels) Gait Pattern/deviations: Step-to pattern, Decreased step length - left, Decreased stance time - left, Decreased dorsiflexion - left,  Decreased weight shift to left, Antalgic, Shuffle Gait velocity: decreased Gait velocity interpretation: <1.31 ft/sec, indicative of household ambulator   General Gait Details: Pt taking small, shuffling steps with an antalgic gait  pattern due to L hip pain from recent THA. No overt LOB, min guard assist for safety  Stairs         General stair comments: Verbally reveiwed feet sequencing on stairs leading up with his R and down with his L, pt able to recall this sequencing prior to being reviewed. Pt declined need to practice at this time  Wheelchair Mobility    Modified Rankin (Stroke Patients Only)       Balance Overall balance assessment: Needs assistance Sitting-balance support: No upper extremity supported, Feet supported Sitting balance-Leahy Scale: Good     Standing balance support: Bilateral upper extremity supported, During functional activity, Reliant on assistive device for balance Standing balance-Leahy Scale: Poor Standing balance comment: Reliant on RW                             Pertinent Vitals/Pain Pain Assessment Pain Assessment: Faces Faces Pain Scale: Hurts even more Pain Location: L hip Pain Descriptors / Indicators: Discomfort, Grimacing, Operative site guarding Pain Intervention(s): Limited activity within patient's tolerance, Monitored during session, Repositioned    Home Living Family/patient expects to be discharged to:: Private residence Living Arrangements: Spouse/significant other;Children (son and daughter) Available Help at Discharge: Family;Available 24 hours/day Type of Home: House Home Access: Stairs to enter Entrance Stairs-Rails: Can reach both Entrance Stairs-Number of Steps: 2   Home Layout: One level Home Equipment: Conservation officer, nature (2 wheels);BSC/3in1;Shower seat;Grab bars - tub/shower;Grab bars - toilet      Prior Function Prior Level of Function : Independent/Modified Independent             Mobility Comments: IND with all ADLs, self care tasks, IADLs and driving NO AD prior to THA; using RW since THA       Hand Dominance        Extremity/Trunk Assessment   Upper Extremity Assessment Upper Extremity Assessment: Defer to OT  evaluation    Lower Extremity Assessment Lower Extremity Assessment: LLE deficits/detail LLE Deficits / Details: hip weakness, MMT </= 2/5 for hip flexion, likely related to hip pain from anterior approach THA 3/5; denies numbness/tingling bil; MMT scores of 5 knee extension and ankle dorsiflexion bil LLE Sensation: WNL LLE Coordination: decreased gross motor    Cervical / Trunk Assessment Cervical / Trunk Assessment: Normal  Communication   Communication: HOH  Cognition Arousal/Alertness: Awake/alert Behavior During Therapy: WFL for tasks assessed/performed Overall Cognitive Status: No family/caregiver present to determine baseline cognitive functioning                                 General Comments: Pt repeating "my daughter is a PT at [employer]" 3x during session, reportedly not recalling he had already mentioned this earlier, stating he is "just proud of her" as the likely cause of why he keeps repeating this comment. No family present to confirm if he has some memory deficits at baseline        General Comments General comments (skin integrity, edema, etc.): SpO2 >/= 91% on RA throughout; Orthostatics - 142/66 & 78 bpm supine, 144/63 & 82 bpm sitting, 143/73 & 88 bpm standing, 144/79 & 86 bpm standing ~3 min    Exercises  Assessment/Plan    PT Assessment Patient needs continued PT services  PT Problem List Decreased strength;Decreased range of motion;Decreased activity tolerance;Decreased balance;Decreased mobility;Decreased coordination;Decreased safety awareness;Decreased knowledge of use of DME;Pain;Decreased cognition       PT Treatment Interventions DME instruction;Gait training;Stair training;Functional mobility training;Therapeutic activities;Therapeutic exercise;Balance training;Neuromuscular re-education;Patient/family education;Cognitive remediation    PT Goals (Current goals can be found in the Care Plan section)  Acute Rehab PT Goals Patient  Stated Goal: to improve PT Goal Formulation: With patient Time For Goal Achievement: 01/12/23 Potential to Achieve Goals: Good    Frequency 7X/week (recent THA)     Co-evaluation               AM-PAC PT "6 Clicks" Mobility  Outcome Measure Help needed turning from your back to your side while in a flat bed without using bedrails?: A Little Help needed moving from lying on your back to sitting on the side of a flat bed without using bedrails?: A Little Help needed moving to and from a bed to a chair (including a wheelchair)?: A Little Help needed standing up from a chair using your arms (e.g., wheelchair or bedside chair)?: A Little Help needed to walk in hospital room?: A Little Help needed climbing 3-5 steps with a railing? : A Little 6 Click Score: 18    End of Session Equipment Utilized During Treatment: Gait belt Activity Tolerance: Patient tolerated treatment well;No increased pain Patient left: in chair;with call bell/phone within reach;with chair alarm set   PT Visit Diagnosis: Unsteadiness on feet (R26.81);Other abnormalities of gait and mobility (R26.89);Muscle weakness (generalized) (M62.81);Pain;Difficulty in walking, not elsewhere classified (R26.2) Pain - Right/Left: Left Pain - part of body: Hip    Time: [id number] PT Time Calculation (min) (ACUTE ONLY): 25 min   Charges:   PT Evaluation $PT Eval Moderate Complexity: 1 Mod PT Treatments $Therapeutic Activity: 8-22 mins        Moishe Spice, PT, DPT Acute Rehabilitation Services  Office: [phone]   Orvan Falconer 12/29/2022, 9:27 AM

## 2022-12-29 NOTE — Hospital Course (Signed)
Larry Elliott is a 77 y.o. male with medical history significant for prostate cancer, basal cell carcinoma, melanoma, coronary artery disease, hypertension, hyperlipidemia, status post left total hip arthroplasty done outpatient on 12/27/2022 by Dr. Rhona Raider, who presented to [facility] ED from home due to sudden onset hypotension and lethargy after receiving a muscle relaxant for pain.  Suspect this is medication side effect.

## 2022-12-29 NOTE — ED Notes (Addendum)
Pt laying in bed comfortable

## 2022-12-30 ENCOUNTER — Encounter: Payer: Self-pay | Admitting: Internal Medicine

## 2022-12-30 DIAGNOSIS — I959 Hypotension, unspecified: Secondary | ICD-10-CM | POA: Diagnosis not present

## 2022-12-30 LAB — MAGNESIUM: Magnesium: 2 mg/dL (ref 1.7–2.4)

## 2022-12-30 LAB — BASIC METABOLIC PANEL
Anion gap: 10 (ref 5–15)
BUN: 10 mg/dL (ref 8–23)
CO2: 25 mmol/L (ref 22–32)
Calcium: 8.2 mg/dL — ABNORMAL LOW (ref 8.9–10.3)
Chloride: 104 mmol/L (ref 98–111)
Creatinine, Ser: 0.8 mg/dL (ref 0.61–1.24)
GFR, Estimated: >60 mL/min (ref >60)
Glucose, Bld: 114 mg/dL — ABNORMAL HIGH (ref 70–99)
Potassium: 3.5 mmol/L (ref 3.5–5.1)
Sodium: 139 mmol/L (ref 135–145)

## 2022-12-30 LAB — CBC
HCT: 31.4 % — ABNORMAL LOW (ref 39.0–52.0)
Hemoglobin: 10.5 g/dL — ABNORMAL LOW (ref 13.0–17.0)
MCH: 31.1 pg (ref 26.0–34.0)
MCHC: 33.4 g/dL (ref 30.0–36.0)
MCV: 92.9 fL (ref 80.0–100.0)
Platelets: 242 10*3/uL (ref 150–400)
RBC: 3.38 MIL/uL — ABNORMAL LOW (ref 4.22–5.81)
RDW: 13.9 % (ref 11.5–15.5)
WBC: 11.2 10*3/uL — ABNORMAL HIGH (ref 4.0–10.5)
nRBC: 0 % (ref 0.0–0.2)

## 2022-12-30 LAB — TROPONIN I (HIGH SENSITIVITY): Troponin I (High Sensitivity): 13 ng/L (ref <18)

## 2022-12-30 MED ORDER — TRAZODONE HCL 50 MG PO TABS: 50.0000 mg | ORAL_TABLET | Freq: Every evening | ORAL | Status: DC | PRN

## 2022-12-30 MED ORDER — MELATONIN 5 MG PO TABS
5.0000 mg | ORAL_TABLET | Freq: Every day | ORAL | Status: DC
  Administered 2022-12-30 – 2023-01-01 (×3): 5 mg via ORAL
  Filled 2022-12-30 (×3): qty 1

## 2022-12-30 MED ORDER — ONDANSETRON HCL 4 MG/2ML IJ SOLN: 4.0000 mg | Freq: Four times a day (QID) | INTRAMUSCULAR | Status: DC | PRN

## 2022-12-30 MED ORDER — DOCUSATE SODIUM 100 MG PO CAPS: 100.0000 mg | ORAL_CAPSULE | Freq: Two times a day (BID) | ORAL | Status: DC | PRN

## 2022-12-30 NOTE — Progress Notes (Signed)
Physical Therapy Treatment Patient Details Name: Larry Elliott MRN: [id number] DOB: June 03, 1946 Today's Date: 12/30/2022   History of Present Illness Pt is a 77 y/o male presenting on 3/6 with hypotension and lethargy. Noted L THA (anterior approach) on 3/5 and dc'd home. PMH: CAD, diverticulosis, HLD, HTN, insomnia, melanoma, prostate CA    PT Comments    Pt is demonstrating good progress in regards to his carryover of cues to push up from sitting surface rather than pull up on the RW for transfers and also in regards to his gait pattern. He is now ambulating with a step-through antalgic gait pattern, needing cues to take longer steps with his R to encourage increased stance time on his L leg. Pt ambulates slowly with min guard assist, but his slow gait pattern suggests he is at risk for falls. Pt's daughter was present this session and reported both she and her brother work during the day and her mother cannot assist the pt if he were to lose balance. Thus, updated d/c recs to short-term rehab at a SNF to increase pt's independence and decrease his risk for falls prior to return home. Pt and daughter in agreement. Will continue to follow acutely.     Recommendations for follow up therapy are one component of a multi-disciplinary discharge planning process, led by the attending physician.  Recommendations may be updated based on patient status, additional functional criteria and insurance authorization.  Follow Up Recommendations  Skilled nursing-short term rehab (<3 hours/day) Can patient physically be transported by private vehicle: Yes   Assistance Recommended at Discharge Frequent or constant Supervision/Assistance  Patient can return home with the following A little help with walking and/or transfers;A little help with bathing/dressing/bathroom;Assistance with cooking/housework;Assist for transportation;Help with stairs or ramp for entrance;Direct supervision/assist for medications  management;Direct supervision/assist for financial management   Equipment Recommendations  None recommended by PT (has all needed DME)    Recommendations for Other Services       Precautions / Restrictions Precautions Precautions: Fall Precaution Comments: L THA (anterior approach) 3/5 Restrictions Weight Bearing Restrictions: No     Mobility  Bed Mobility               General bed mobility comments: Pt sitting up in recliner upon arrival.    Transfers Overall transfer level: Needs assistance Equipment used: Rolling walker (2 wheels) Transfers: Sit to/from Stand Sit to Stand: Min guard           General transfer comment: Pt pushing up from recliner or commode arm rests without needing cues, min guard assist for safety with slow power up to stand. No LOB    Ambulation/Gait Ambulation/Gait assistance: Min guard Gait Distance (Feet): 90 Feet (x2 bouts of ~10 ft > ~90 ft) Assistive device: Rolling walker (2 wheels) Gait Pattern/deviations: Decreased stance time - left, Decreased weight shift to left, Antalgic, Step-through pattern, Decreased step length - right, Decreased dorsiflexion - right Gait velocity: decreased Gait velocity interpretation: <1.8 ft/sec, indicate of risk for recurrent falls   General Gait Details: Pt with improved step-through gait pattern and L foot clearance today. However, pt still displaying decreased L stance time resulting in decreased R step length and foot clearance. Tactile and verbal cues provided to correct with noted good success. Slow, but no LOB, min guard for safety   Stairs             Wheelchair Mobility    Modified Rankin (Stroke Patients Only)  Balance Overall balance assessment: Needs assistance Sitting-balance support: No upper extremity supported, Feet supported Sitting balance-Leahy Scale: Good     Standing balance support: Bilateral upper extremity supported, During functional activity, Reliant on  assistive device for balance Standing balance-Leahy Scale: Poor Standing balance comment: Reliant on RW                            Cognition Arousal/Alertness: Awake/alert Behavior During Therapy: WFL for tasks assessed/performed Overall Cognitive Status: Impaired/Different from baseline Area of Impairment: Attention, Memory, Problem solving                   Current Attention Level: Sustained Memory: Decreased short-term memory       Problem Solving: Slow processing, Difficulty sequencing, Requires verbal cues General Comments: Improved recall and carryover of hand placement with transfers today. Slow to process cues        Exercises Total Joint Exercises Hip ABduction/ADduction: AROM, Both, 10 reps, Seated Marching in Standing: Left, AAROM, 10 reps, Seated Standing Hip Extension: AROM, Left, 10 reps, Standing (with RW)    General Comments General comments (skin integrity, edema, etc.): daughter present and reporting she works and her brother works days and their mother cannot physically assist pt if he were to lose balance/fall-requesting SNF for short-term rehab to improve his independence prior to return home      Pertinent Vitals/Pain Pain Assessment Pain Assessment: Faces Faces Pain Scale: Hurts even more Pain Location: L hip Pain Descriptors / Indicators: Discomfort, Grimacing, Operative site guarding Pain Intervention(s): Limited activity within patient's tolerance, Monitored during session, Repositioned    Home Living                          Prior Function            PT Goals (current goals can now be found in the care plan section) Acute Rehab PT Goals Patient Stated Goal: to improve PT Goal Formulation: With patient/family Time For Goal Achievement: 01/12/23 Potential to Achieve Goals: Good Progress towards PT goals: Progressing toward goals    Frequency    7X/week (recent THA)      PT Plan Discharge plan needs to  be updated    Co-evaluation              AM-PAC PT "6 Clicks" Mobility   Outcome Measure  Help needed turning from your back to your side while in a flat bed without using bedrails?: A Little Help needed moving from lying on your back to sitting on the side of a flat bed without using bedrails?: A Little Help needed moving to and from a bed to a chair (including a wheelchair)?: A Little Help needed standing up from a chair using your arms (e.g., wheelchair or bedside chair)?: A Little Help needed to walk in hospital room?: A Little Help needed climbing 3-5 steps with a railing? : A Little 6 Click Score: 18    End of Session Equipment Utilized During Treatment: Gait belt Activity Tolerance: Patient tolerated treatment well Patient left: in chair;with call bell/phone within reach;with chair alarm set;with family/visitor present   PT Visit Diagnosis: Unsteadiness on feet (R26.81);Other abnormalities of gait and mobility (R26.89);Muscle weakness (generalized) (M62.81);Pain;Difficulty in walking, not elsewhere classified (R26.2) Pain - Right/Left: Left Pain - part of body: Hip     Time: [id number] PT Time Calculation (min) (ACUTE ONLY): 25 min  Charges:  $Gait  Training: 8-22 mins $Therapeutic Exercise: 8-22 mins                     Moishe Spice, PT, DPT Acute Rehabilitation Services  Office: [place] 12/30/2022, 11:18 AM

## 2022-12-30 NOTE — Progress Notes (Signed)
Patient was assisted to and from [facility] numerous times throughout the shift, nearly 15 if not more. Patient felt as if he needed to have a BM but had very small or smears. Patient did not sleep at all throughout the night and seems as if this has affected his cognition and caused him to become more forgetful. Patient attempted to get up from recliner back to bed with assistance and nearly fell. Patient reminded many time to call for assistance and not get up to prevent falling. Patient's bed alarm went off alerting patient to stay in bed, he continued to get up regardless. Bed alarm on, fall bracelet on, fall mat down, and patient educated as appropriate.

## 2022-12-30 NOTE — TOC Progression Note (Signed)
Transition of Care [facility]) - Progression Note    Patient Details  Name: ELEFTERIOS FLETES MRN: [id number] Date of Birth: June 07, 1946  Transition of Care [facility]) CM/SW [place], [place] Phone Number: 12/30/2022, 4:00 PM  Clinical Narrative:   CSW met with patient's daughter, [name], at bedside to discuss SNF offers. [name] asked for time to review options, called CSW back and chose [facility]. [facility] won't have a bed available until Monday. CSW to follow.    Expected Discharge Plan: [place] Barriers to Discharge: Continued Medical Work up  Expected Discharge Plan and Services In-house Referral: Clinical Social Work Discharge Planning Services: CM Consult Post Acute Care Choice: [place] Living arrangements for the past 2 months: Single Family Home                                       Social Determinants of Health (SDOH) Interventions SDOH Screenings   Food Insecurity: No Food Insecurity (12/29/2022)  Housing: Low Risk  (12/29/2022)  Transportation Needs: No Transportation Needs (12/29/2022)  Utilities: Not At Risk (12/29/2022)  Depression (PHQ2-9): Low Risk  (11/24/2022)  Tobacco Use: Low Risk  (12/29/2022)    Readmission Risk Interventions     No data to display

## 2022-12-30 NOTE — Progress Notes (Signed)
[facility] Progress Note Patient: Larry Elliott [id number] DOB: [birth year] DOA: 12/28/2022  DOS: the patient was seen and examined on 12/30/2022  Brief hospital course: TRAYVOND NEUJAHR is a 77 y.o. male with medical history significant for prostate cancer, basal cell carcinoma, melanoma, coronary artery disease, hypertension, hyperlipidemia, status post left total hip arthroplasty done outpatient on 12/27/2022 by Dr. Rhona Raider, who presented to [facility] ED from home due to sudden onset hypotension and lethargy after receiving a muscle relaxant for pain.  Suspect this is medication side effect. Assessment and Plan: Hypotension Secondary to medication side effect. Received IV fluid bolus and was started on epinephrine drip en route by EMS.  Also received IV fluid boluses in the ED per sepsis protocol.  His blood pressure improved. Now off vasopressor. Blood pressure is stable. Will monitor.   Sepsis ruled out SIRS. Present on admission secondary to leukocytosis and hypotension but most likely secondary to medication side effect. Discontinue antibiotics. So far no growth on the cultures.   Lethargy, presyncope, secondary to severe hypotension, hypovolemia. His mentation has returned to baseline Continue to treat underlying conditions Orthostatic vital signs normal. PT OT initially recommended outpatient PT currently recommending CIR/SNF.  AKI, suspect prerenal in the setting of dehydration. Baseline creatinine appears to be 0.7 with GFR greater than 60 Presented with creatinine of 1.23 with GFR of 60. Avoid nephrotoxic agents, dehydration and hypotension. Treated with IV fluid.  Now appears to have some mild volume overload.  Will monitor.   Mild hypovolemic hyponatremia Corrected.  Monitor.   Use of DOAC, Eliquis Possibly use as DVT prophylaxis. On Eliquis 2.5 mg twice daily.  Recent hip surgery. Notified orthopedics about patient presentation. Will monitor.  Bilateral  edema. Will monitor.  Noncardiac chest pain. EKG negative.  Troponin negative.  On Eliquis.  Pain resolved.  Monitor.   Subjective: Positive result.  No nausea no vomiting no fever no chills.  Had some chest pain earlier which is currently resolved.  Physical Exam: Clear to auscultation heart S1-S2 present Less agitated at time of my evaluation and less anxious. Bilateral lower extremity edema present.  Data Reviewed: I have Reviewed nursing notes, Vitals, and Lab results. CBC and CMP reviewed. Reorder CBC and CMP due to electrode abnormality.  Disposition: Status is: Inpatient Remains inpatient appropriate because: Awaiting placement.  apixaban (ELIQUIS) tablet 2.5 mg Start: 12/29/22 1000 apixaban (ELIQUIS) tablet 2.5 mg   Family Communication: daughter at bedside  Level of care: Progressive   Vitals:   12/30/22 0754 12/30/22 1135 12/30/22 1532 12/30/22 1926  BP: (!) 154/77 (!) 158/86 124/70 (!) 175/87  Pulse: 85 87 86 96  Resp: '18 20 19 16  '$ Temp: (!) 97.4 F (36.3 C) 98.2 F (36.8 C) (!) 97.4 F (36.3 C) 98.2 F (36.8 C)  TempSrc: Oral Oral Oral Oral  SpO2: 94% 96% 98% 95%  Weight:      Height:         Author: Berle Mull, MD 12/30/2022 7:55 PM  Please look on [email or web address] to find out who is on call.

## 2022-12-30 NOTE — Progress Notes (Signed)
Mobility Specialist: Progress Note   12/30/22 1603  Mobility  Activity Ambulated with assistance to bathroom  Level of Assistance Minimal assist, patient does 75% or more  Assistive Device Front wheel walker  Distance Ambulated (ft) 40 ft (20'x2)  Activity Response Tolerated well  Mobility Referral Yes  $Mobility charge 1 Mobility   Pt received in the bed and requesting to use the BR. MinA with bed mobility as well as to stand. C/o 6/10 pain in LLE during ambulation, otherwise asymptomatic. Pt declining further ambulation secondary to pain. Pt is in the chair per request with call bell and phone in reach. Chair alarm is on.   [place] Delainy Mcelhiney Mobility Specialist Please contact via SecureChat or Rehab office at [phone]

## 2022-12-30 NOTE — NC FL2 (Signed)
[place] LEVEL OF CARE FORM     IDENTIFICATION  Patient Name: Larry Elliott Birthdate: [birth year] Sex: male Admission Date (Current Location): 12/28/2022  [facility] and [place] Number:  Herbalist and Address:  [facility], [place] [address] [place], [place] [address]      Provider Number: [id number]  Attending Physician Name and Address:  Lavina Hamman, MD  Relative Name and Phone Number:       Current Level of Care: Hospital Recommended Level of Care: [place] Prior Approval Number:    Date Approved/Denied:   PASRR Number: [id number]  Discharge Plan: SNF    Current Diagnoses: Patient Active Problem List   Diagnosis Date Noted   Hypotension 12/28/2022   Primary localized osteoarthrosis of left hip 12/27/2022   AMS (altered mental status) 05/16/2022   Encephalopathy acute    Radiculopathy 12/04/2019   Low back pain 08/03/2018   PCP NOTES >>>>> 09/06/2015   LLQ abdominal pain 07/16/2014   Anxiety and depression 11/04/2013   Pain in joint, ankle and foot 12/14/2012   Annual physical exam 03/11/2011   DJD (degenerative joint disease) 03/17/2010   MELANOMA, FACE 11/20/2007   History of malignant neoplasm of prostate 08/05/2007   Hyperlipidemia 02/21/2007   Essential hypertension 02/21/2007   DIVERTICULOSIS, COLON 02/21/2007   Insomnia 02/21/2007    Orientation RESPIRATION BLADDER Height & Weight     Self, Time, Place, Situation  Normal Continent Weight: 250 lb 10.6 oz (113.7 kg) Height:  '5\' 10"'$  (177.8 cm)  BEHAVIORAL SYMPTOMS/MOOD NEUROLOGICAL BOWEL NUTRITION STATUS      Continent Diet (heart healthy)  AMBULATORY STATUS COMMUNICATION OF NEEDS Skin   Limited Assist Verbally Surgical wounds (left hip, aquacel)                       Personal Care Assistance Level of Assistance  Bathing, Feeding, Dressing Bathing Assistance: Limited assistance Feeding assistance: Limited  assistance Dressing Assistance: Limited assistance     Functional Limitations Info  Hearing   Hearing Info: Impaired      SPECIAL CARE FACTORS FREQUENCY  PT (By licensed PT), OT (By licensed OT)     PT Frequency: 5x/wk OT Frequency: 5x/wk            Contractures Contractures Info: Not present    Additional Factors Info  Code Status, Allergies Code Status Info: DNR Allergies Info: Ambien (Zolpidem), Aspirin, Hydrocodone, Penicillins           Current Medications (12/30/2022):  This is the current hospital active medication list Current Facility-Administered Medications  Medication Dose Route Frequency Provider Last Rate Last Admin   acetaminophen (TYLENOL) tablet 650 mg  650 mg Oral Q6H PRN Kayleen Memos, DO   650 mg at 12/30/22 1005   apixaban (ELIQUIS) tablet 2.5 mg  2.5 mg Oral BID Irene Pap N, DO   2.5 mg at 12/30/22 0958   atorvastatin (LIPITOR) tablet 40 mg  40 mg Oral QHS Irene Pap N, DO   40 mg at 12/29/22 2116   bisacodyl (DULCOLAX) suppository 10 mg  10 mg Rectal Daily PRN Lavina Hamman, MD       docusate sodium (COLACE) capsule 100 mg  100 mg Oral BID PRN Lavina Hamman, MD       HYDROmorphone (DILAUDID) injection 0.5 mg  0.5 mg Intravenous Q4H PRN Irene Pap N, DO   0.5 mg at 12/29/22 2116   melatonin  tablet 5 mg  5 mg Oral QHS Lavina Hamman, MD       ondansetron [facility]) injection 4 mg  4 mg Intravenous Q6H PRN Lavina Hamman, MD       Oral care mouth rinse  15 mL Mouth Rinse PRN Irene Pap N, DO       oxyCODONE-acetaminophen (PERCOCET/ROXICET) 5-325 MG per tablet 1 tablet  1 tablet Oral Q4H PRN Lavina Hamman, MD   1 tablet at 12/30/22 0419   polyethylene glycol (MIRALAX / GLYCOLAX) packet 17 g  17 g Oral Daily Lavina Hamman, MD       traZODone (DESYREL) tablet 100 mg  100 mg Oral QHS Lavina Hamman, MD   100 mg at 12/29/22 2117   traZODone (DESYREL) tablet 50 mg  50 mg Oral QHS PRN Lavina Hamman, MD         Discharge  Medications: Please see discharge summary for a list of discharge medications.  Relevant Imaging Results:  Relevant Lab Results:   Additional Information SS#: [id number]  Geralynn Ochs, LCSW

## 2022-12-30 NOTE — Progress Notes (Signed)
Occupational Therapy Treatment Patient Details Name: Larry Elliott MRN: [id number] DOB: [birth year] Today's Date: 12/30/2022   History of present illness Pt is a 77 y/o male presenting on 3/6 with hypotension and lethargy. Noted L THA (anterior approach) on 3/5 and dc'd home. PMH: CAD, diverticulosis, HLD, HTN, insomnia, melanoma, prostate CA   OT comments  Patient engaged in pill box test to further assess functional cognition (daughter present and reports decline in cognition recently, having to assist with finances last weekend)- he passed pill box test with no errors, increased time required. Functionally, completing transfers with min guard and groomign at sink with min guard. Updated dc plan to SNF as pt has decreased support at home during daytime; to optimize independence and decrease risk of falls. Will follow.       Recommendations for follow up therapy are one component of a multi-disciplinary discharge planning process, led by the attending physician.  Recommendations may be updated based on patient status, additional functional criteria and insurance authorization.    Follow Up Recommendations  Skilled nursing-short term rehab (<3 hours/day)     Assistance Recommended at Discharge Frequent or constant Supervision/Assistance  Patient can return home with the following  A little help with walking and/or transfers;A lot of help with bathing/dressing/bathroom;Assistance with cooking/housework;Assist for transportation;Help with stairs or ramp for entrance;Direct supervision/assist for medications management;Direct supervision/assist for financial management   Equipment Recommendations  None recommended by OT    Recommendations for Other Services      Precautions / Restrictions Precautions Precautions: Fall Precaution Comments: L THA (anterior approach) 3/5 Restrictions Weight Bearing Restrictions: No       Mobility Bed Mobility               General bed mobility  comments: Pt sitting up in recliner upon arrival.    Transfers Overall transfer level: Needs assistance Equipment used: Rolling walker (2 wheels) Transfers: Sit to/from Stand Sit to Stand: Min guard                 Balance Overall balance assessment: Needs assistance Sitting-balance support: No upper extremity supported, Feet supported Sitting balance-Leahy Scale: Good     Standing balance support: Bilateral upper extremity supported, No upper extremity supported, During functional activity Standing balance-Leahy Scale: Poor Standing balance comment: Reliant on RW dyanmically but able to engage in grooming tasks with 0 hand support and min guard                           ADL either performed or assessed with clinical judgement   ADL Overall ADL's : Needs assistance/impaired     Grooming: Min guard;Oral care;Standing                   Toilet Transfer: Min guard;Ambulation;Rolling walker (2 wheels)           Functional mobility during ADLs: Min guard;Rolling walker (2 wheels)      Extremity/Trunk Assessment              Vision       Perception     Praxis      Cognition Arousal/Alertness: Awake/alert Behavior During Therapy: WFL for tasks assessed/performed Overall Cognitive Status: Impaired/Different from baseline Area of Impairment: Attention, Memory, Problem solving, Awareness                   Current Attention Level: Sustained Memory: Decreased short-term memory     Awareness: Emergent  Problem Solving: Slow processing, Difficulty sequencing, Requires verbal cues General Comments: patient completed pill box test with increased time and no errors, see below for details   Functional cognition further assessed with The Pillbox Test: A Measure of Executive Functioning and Estimate of Medication Management. A straight pass/fail designation is determined by 3 or more errors of omission or misplacement on the task. The pt  completed the test with less than 3 errors. See cognition section below.   Patient demonstrates decreased recall and attention during assessment, having to look at medication bottles multiple times but able to manage and complete assessment with 0 errors given increased time.      Exercises      Shoulder Instructions       General Comments daughter present and supportive    Pertinent Vitals/ Pain       Pain Assessment Pain Assessment: Faces Faces Pain Scale: Hurts little more Pain Location: L hip Pain Descriptors / Indicators: Discomfort, Grimacing, Operative site guarding Pain Intervention(s): Monitored during session, Limited activity within patient's tolerance, Repositioned  Home Living                                          Prior Functioning/Environment              Frequency  Min 2X/week        Progress Toward Goals  OT Goals(current goals can now be found in the care plan section)  Progress towards OT goals: Progressing toward goals  Acute Rehab OT Goals Patient Stated Goal: get better OT Goal Formulation: With patient Time For Goal Achievement: 01/12/23 Potential to Achieve Goals: Good  Plan Frequency remains appropriate;Discharge plan needs to be updated    Co-evaluation                 AM-PAC OT "6 Clicks" Daily Activity     Outcome Measure   Help from another person eating meals?: None Help from another person taking care of personal grooming?: A Little Help from another person toileting, which includes using toliet, bedpan, or urinal?: A Lot Help from another person bathing (including washing, rinsing, drying)?: A Lot Help from another person to put on and taking off regular upper body clothing?: A Little Help from another person to put on and taking off regular lower body clothing?: A Lot 6 Click Score: 16    End of Session Equipment Utilized During Treatment: Rolling walker (2 wheels)  OT Visit Diagnosis: Other  abnormalities of gait and mobility (R26.89);Muscle weakness (generalized) (M62.81);Other symptoms and signs involving cognitive function   Activity Tolerance Patient tolerated treatment well   Patient Left in chair;with call bell/phone within reach;with chair alarm set;with family/visitor present   Nurse Communication Mobility status        Time: [id number] OT Time Calculation (min): 31 min  Charges: OT General Charges $OT Visit: 1 Visit OT Treatments $Self Care/Home Management : 23-37 mins  [place] [phone]   Delight Stare 12/30/2022, 11:49 AM

## 2022-12-30 NOTE — TOC Initial Note (Signed)
Transition of Care [facility]) - Initial/Assessment Note    Patient Details  Name: Larry Elliott MRN: [id number] Date of Birth: Oct 15, 1946  Transition of Care [facility]) CM/SW Contact:    Pollie Friar, RN Phone Number: 12/30/2022, 12:01 PM  Clinical Narrative:                 Recommendations have changed to SNF rehab before home. CM met with the patient and his daughter and they are in agreement. They asked to be faxed out in the [facility] area. Their first choice is [facility]. CM has updated CSW. TOC will need to provide bed offers once available.  TOC following.  Expected Discharge Plan: Skilled Nursing Facility Barriers to Discharge: Continued Medical Work up   Patient Goals and CMS Choice   CMS Medicare.gov Compare Post Acute Care list provided to:: Patient Represenative (must comment) Choice offered to / list presented to : Adult Children      Expected Discharge Plan and Services In-house Referral: Clinical Social Work Discharge Planning Services: CM Consult Post Acute Care Choice: [place] Living arrangements for the past 2 months: [place]                                      Prior Living Arrangements/Services Living arrangements for the past 2 months: Single Family Home Lives with:: Spouse Patient language and need for interpreter reviewed:: Yes Do you feel safe going back to the place where you live?: Yes      Need for Family Participation in Patient Care: Yes (Comment) Care giver support system in place?: No (comment)   Criminal Activity/Legal Involvement Pertinent to Current Situation/Hospitalization: No - Comment as needed  Activities of Daily Living Home Assistive Devices/Equipment: Eyeglasses, Grab bars in shower, Shower chair with back, Walker (specify type) ADL Screening (condition at time of admission) Patient's cognitive ability adequate to safely complete daily activities?: Yes (pt HOH, information obtained from  previous enncounter 12/26/22) Is the patient deaf or have difficulty hearing?: Yes Does the patient have difficulty seeing, even when wearing glasses/contacts?: No Does the patient have difficulty concentrating, remembering, or making decisions?: No Patient able to express need for assistance with ADLs?: Yes Does the patient have difficulty dressing or bathing?: No Independently performs ADLs?: Yes (appropriate for developmental age) Does the patient have difficulty walking or climbing stairs?: Yes Weakness of Legs: Left Weakness of Arms/Hands: None  Permission Sought/Granted                  Emotional Assessment Appearance:: Appears stated age Attitude/Demeanor/Rapport: Engaged Affect (typically observed): Accepting Orientation: : Oriented to Self, Oriented to Place, Oriented to Situation   Psych Involvement: No (comment)  Admission diagnosis:  Lactic acid acidosis [E87.20] AKI (acute kidney injury) ([place]) [N17.9] Hypotension [I95.9] Hypotension, unspecified hypotension type [I95.9] Syncope, unspecified syncope type [R55] Patient Active Problem List   Diagnosis Date Noted   Hypotension 12/28/2022   Primary localized osteoarthrosis of left hip 12/27/2022   AMS (altered mental status) 05/16/2022   Encephalopathy acute    Radiculopathy 12/04/2019   Low back pain 08/03/2018   PCP NOTES >>>>> 09/06/2015   LLQ abdominal pain 07/16/2014   Anxiety and depression 11/04/2013   Pain in joint, ankle and foot 12/14/2012   Annual physical exam 03/11/2011   DJD (degenerative joint disease) 03/17/2010   MELANOMA, FACE 11/20/2007   History of malignant neoplasm  of prostate 08/05/2007   Hyperlipidemia 02/21/2007   Essential hypertension 02/21/2007   DIVERTICULOSIS, COLON 02/21/2007   Insomnia 02/21/2007   PCP:  Colon Branch, MD Pharmacy:   [facility] # [address] [place] [address] [place] [address] Phone: [phone] Fax:  [place] [address] [place] [address] Phone: [phone] Fax: [phone]     Social Determinants of Health (SDOH) Social History: SDOH Screenings   Food Insecurity: No Food Insecurity (12/29/2022)  Housing: Low Risk  (12/29/2022)  Transportation Needs: No Transportation Needs (12/29/2022)  Utilities: Not At Risk (12/29/2022)  Depression (PHQ2-9): Low Risk  (11/24/2022)  Tobacco Use: Low Risk  (12/29/2022)   SDOH Interventions:     Readmission Risk Interventions     No data to display

## 2022-12-31 DIAGNOSIS — I959 Hypotension, unspecified: Secondary | ICD-10-CM | POA: Diagnosis not present

## 2022-12-31 LAB — CBC
HCT: 35.1 % — ABNORMAL LOW (ref 39.0–52.0)
Hemoglobin: 11.4 g/dL — ABNORMAL LOW (ref 13.0–17.0)
MCH: 30.2 pg (ref 26.0–34.0)
MCHC: 32.5 g/dL (ref 30.0–36.0)
MCV: 92.9 fL (ref 80.0–100.0)
Platelets: 319 10*3/uL (ref 150–400)
RBC: 3.78 MIL/uL — ABNORMAL LOW (ref 4.22–5.81)
RDW: 13.7 % (ref 11.5–15.5)
WBC: 10 10*3/uL (ref 4.0–10.5)
nRBC: 0 % (ref 0.0–0.2)

## 2022-12-31 LAB — BASIC METABOLIC PANEL
Anion gap: 7 (ref 5–15)
BUN: 10 mg/dL (ref 8–23)
CO2: 25 mmol/L (ref 22–32)
Calcium: 8.3 mg/dL — ABNORMAL LOW (ref 8.9–10.3)
Chloride: 107 mmol/L (ref 98–111)
Creatinine, Ser: 0.75 mg/dL (ref 0.61–1.24)
GFR, Estimated: >60 mL/min (ref >60)
Glucose, Bld: 114 mg/dL — ABNORMAL HIGH (ref 70–99)
Potassium: 3.4 mmol/L — ABNORMAL LOW (ref 3.5–5.1)
Sodium: 139 mmol/L (ref 135–145)

## 2022-12-31 LAB — MAGNESIUM: Magnesium: 2.3 mg/dL (ref 1.7–2.4)

## 2022-12-31 MED ORDER — POLYETHYLENE GLYCOL 3350 17 G PO PACK: 17.0000 g | PACK | Freq: Every day | ORAL | Status: DC | PRN

## 2022-12-31 MED ORDER — FUROSEMIDE 20 MG PO TABS
20.0000 mg | ORAL_TABLET | Freq: Every day | ORAL | Status: DC
  Administered 2022-12-31 – 2023-01-02 (×3): 20 mg via ORAL
  Filled 2022-12-31 (×3): qty 1

## 2022-12-31 MED ORDER — POTASSIUM CHLORIDE CRYS ER 20 MEQ PO TBCR
40.0000 meq | EXTENDED_RELEASE_TABLET | Freq: Once | ORAL | Status: AC
  Administered 2022-12-31: 40 meq via ORAL
  Filled 2022-12-31: qty 2

## 2022-12-31 NOTE — Progress Notes (Signed)
PT Cancellation Note  Patient Details Name: Larry Elliott MRN: [id number] DOB: 02-05-1946   Cancelled Treatment:    Reason Eval/Treat Not Completed: Patient declined, no reason specified (pt declining PT session due to fatigue)  Wyona Almas, PT, DPT Acute Rehabilitation Services Office [place] 12/31/2022, 11:25 AM

## 2022-12-31 NOTE — Progress Notes (Signed)
Physical Therapy Treatment Patient Details Name: Larry Elliott MRN: [id number] DOB: 10/20/1946 Today's Date: 12/31/2022   History of Present Illness Pt is a 77 y/o male presenting on 3/6 with hypotension and lethargy. Noted L THA (anterior approach) on 3/5 and dc'd home. PMH: CAD, diverticulosis, HLD, HTN, insomnia, melanoma, prostate CA    PT Comments    Pt progressing steadily towards his physical therapy goals and is agreeable to participate. Able to perform seated exercises for strengthening and ambulating 120 ft with a walker at a min guard assist level. Gait speed of 1.23 ft/s indicative of higher fall risk. Continue to recommend SNF for ongoing Physical Therapy.      Recommendations for follow up therapy are one component of a multi-disciplinary discharge planning process, led by the attending physician.  Recommendations may be updated based on patient status, additional functional criteria and insurance authorization.  Follow Up Recommendations  Skilled nursing-short term rehab (<3 hours/day) Can patient physically be transported by private vehicle: Yes   Assistance Recommended at Discharge Frequent or constant Supervision/Assistance  Patient can return home with the following A little help with walking and/or transfers;A little help with bathing/dressing/bathroom;Assistance with cooking/housework;Assist for transportation;Help with stairs or ramp for entrance;Direct supervision/assist for medications management;Direct supervision/assist for financial management   Equipment Recommendations  None recommended by PT (has all needed DME)    Recommendations for Other Services       Precautions / Restrictions Precautions Precautions: Fall Precaution Comments: L THA (anterior approach) 3/5 Restrictions Weight Bearing Restrictions: No     Mobility  Bed Mobility               General bed mobility comments: Pt sitting up in recliner upon arrival.    Transfers Overall  transfer level: Needs assistance Equipment used: Rolling walker (2 wheels) Transfers: Sit to/from Stand Sit to Stand: Min guard           General transfer comment: Min guard for safety, cueing for hand placement    Ambulation/Gait Ambulation/Gait assistance: Min guard Gait Distance (Feet): 120 Feet Assistive device: Rolling walker (2 wheels) Gait Pattern/deviations: Decreased stance time - left, Decreased weight shift to left, Antalgic, Step-through pattern, Decreased step length - right Gait velocity: 1.23 ft/s Gait velocity interpretation: <1.31 ft/sec, indicative of household ambulator   General Gait Details: Improved step through pattern with L heel strike at initial contact. Min guard overall for safety.   Stairs             Wheelchair Mobility    Modified Rankin (Stroke Patients Only)       Balance Overall balance assessment: Needs assistance Sitting-balance support: No upper extremity supported, Feet supported Sitting balance-Leahy Scale: Good     Standing balance support: Bilateral upper extremity supported, During functional activity, Reliant on assistive device for balance Standing balance-Leahy Scale: Poor Standing balance comment: Reliant on RW                            Cognition Arousal/Alertness: Awake/alert Behavior During Therapy: WFL for tasks assessed/performed Overall Cognitive Status: Impaired/Different from baseline Area of Impairment: Attention, Memory, Problem solving                   Current Attention Level: Selective Memory: Decreased short-term memory       Problem Solving: Slow processing, Difficulty sequencing, Requires verbal cues General Comments: Improved recall and carryover of hand placement with transfers today. Slow to process  cues        Exercises Total Joint Exercises Ankle Circles/Pumps: Both, 10 reps, Seated Hip ABduction/ADduction: AROM, Both, 10 reps, Seated Long Arc Quad: Both, 10 reps,  Seated    General Comments        Pertinent Vitals/Pain Pain Assessment Pain Assessment: Faces Faces Pain Scale: Hurts a little bit Pain Location: L hip Pain Descriptors / Indicators: Operative site guarding Pain Intervention(s): Monitored during session    Home Living                          Prior Function            PT Goals (current goals can now be found in the care plan section) Acute Rehab PT Goals Patient Stated Goal: to improve PT Goal Formulation: With patient/family Time For Goal Achievement: 01/12/23 Potential to Achieve Goals: Good Progress towards PT goals: Progressing toward goals    Frequency    Min 3X/week      PT Plan Frequency needs to be updated    Co-evaluation              AM-PAC PT "6 Clicks" Mobility   Outcome Measure  Help needed turning from your back to your side while in a flat bed without using bedrails?: A Little Help needed moving from lying on your back to sitting on the side of a flat bed without using bedrails?: A Little Help needed moving to and from a bed to a chair (including a wheelchair)?: A Little Help needed standing up from a chair using your arms (e.g., wheelchair or bedside chair)?: A Little Help needed to walk in hospital room?: A Little Help needed climbing 3-5 steps with a railing? : A Little 6 Click Score: 18    End of Session Equipment Utilized During Treatment: Gait belt Activity Tolerance: Patient tolerated treatment well Patient left: in chair;with call bell/phone within reach;with chair alarm set;with family/visitor present   PT Visit Diagnosis: Unsteadiness on feet (R26.81);Other abnormalities of gait and mobility (R26.89);Muscle weakness (generalized) (M62.81);Pain;Difficulty in walking, not elsewhere classified (R26.2) Pain - Right/Left: Left Pain - part of body: Hip     Time: [id number] PT Time Calculation (min) (ACUTE ONLY): 12 min  Charges:  $Therapeutic Activity: 8-22 mins                      Larry Elliott, PT, DPT Acute Rehabilitation Services Office [phone]    Larry Elliott 12/31/2022, 3:33 PM

## 2022-12-31 NOTE — Progress Notes (Signed)
[facility] Progress Note Patient: Larry Elliott [id number] DOB: 1945-12-24 DOA: 12/28/2022  DOS: the patient was seen and examined on 12/31/2022  Brief hospital course: DETRAVION Larry Elliott is a 77 y.o. male with medical history significant for prostate cancer, basal cell carcinoma, melanoma, coronary artery disease, hypertension, hyperlipidemia, status post left total hip arthroplasty done outpatient on 12/27/2022 by Dr. Rhona Raider, who presented to [facility] ED from home due to sudden onset hypotension and lethargy after receiving a muscle relaxant for pain.  Suspect this is medication side effect. Assessment and Plan: Hypotension Acute toxic encephalopathy in the setting of Zanaflex Secondary to medication side effect. Presents with confusion and unresponsive episode.  Was found severely hypotensive.  Received IV fluid bolus and was started on epinephrine drip en route by EMS.  Also received IV fluid boluses in the ED per sepsis protocol.  His blood pressure improved. Patient was able to come off of vasopressor. Currently blood pressure is significantly stable. Mentation improving.  Monitor.   Sepsis ruled out SIRS. Present on admission secondary to leukocytosis and hypotension but most likely secondary to medication side effect. Discontinue antibiotics. So far no growth on the cultures.   AKI, suspect prerenal in the setting of dehydration. Baseline creatinine appears to be 0.7 with GFR greater than 60 Presented with creatinine of 1.23 with GFR of 60. Treated with IV fluids.  Now due to volume overload I will resume his home dose of Lasix.   Mild hypovolemic hyponatremia Corrected.  Monitor.   Use of DOAC, Eliquis Possibly use as DVT prophylaxis. On Eliquis 2.5 mg twice daily.   Recent hip surgery. Notified orthopedics about patient presentation. Will monitor.  PT OT recommends CIR/SNF.   Bilateral pedal edema. Stable.  No tenderness.  Initiating Lasix.   Noncardiac chest  pain. EKG negative.  Troponin negative.  On Eliquis.  Pain resolved.  Monitor.  Insomnia. Possible sleep apnea. Obesity. Reported restless leg. Body mass index is 35.91 kg/m.  Patient is on trazodone to help with his insomnia.  Currently it has worked well in the hospital as well. Outpatient patient is scheduled for a sleep study which I support. Patient reported to have some restless leg during the daytime when he is awake I do not think that this is restless leg syndrome and I do not think that it requires any therapy for now.  Constipation. Possible ileus. X-ray abdomen at the time of admission showed a possibility of an ileus. Currently tolerating oral diet.  No nausea or vomiting.  And having bowel movements.  No abdominal pain.  Subjective: Denies any acute complaint.  No nausea no vomiting.  Physical Exam: General: in Mild distress, No Rash Cardiovascular: S1 and S2 Present, No Murmur Respiratory: Good respiratory effort, Bilateral Air entry present. No Crackles, No wheezes Abdomen: Bowel Sound present, No tenderness Extremities: Bilateral edema Neuro: Alert and oriented x3, no new focal deficit  Data Reviewed: I have Reviewed nursing notes, Vitals, and Lab results. Since last encounter, pertinent lab results CBC and BMP   . I have ordered test including CBC and BMP  .   Disposition: Status is: Inpatient Remains inpatient appropriate because: Awaiting SNF placement  apixaban (ELIQUIS) tablet 2.5 mg Start: 12/29/22 1000 apixaban (ELIQUIS) tablet 2.5 mg   Family Communication: No one at bedside Level of care: Progressive switch to MedSurg Vitals:   12/31/22 0735 12/31/22 1129 12/31/22 1436 12/31/22 1509  BP: (!) 165/90 (!) 170/90  138/79  Pulse: 86 91  82  Resp:  $'17 20  17  'S$ Temp: 98.1 F (36.7 C) 97.8 F (36.6 C)  97.9 F (36.6 C)  TempSrc: Oral Oral  Oral  SpO2: 93% 95%  98%  Weight:   113.5 kg   Height:         Author: Berle Mull, MD 12/31/2022 4:59  PM  Please look on [email or web address] to find out who is on call.

## 2023-01-01 DIAGNOSIS — I959 Hypotension, unspecified: Secondary | ICD-10-CM | POA: Diagnosis not present

## 2023-01-01 LAB — BASIC METABOLIC PANEL
Anion gap: 10 (ref 5–15)
BUN: 12 mg/dL (ref 8–23)
CO2: 24 mmol/L (ref 22–32)
Calcium: 8.6 mg/dL — ABNORMAL LOW (ref 8.9–10.3)
Chloride: 107 mmol/L (ref 98–111)
Creatinine, Ser: 0.74 mg/dL (ref 0.61–1.24)
GFR, Estimated: >60 mL/min (ref >60)
Glucose, Bld: 109 mg/dL — ABNORMAL HIGH (ref 70–99)
Potassium: 3.7 mmol/L (ref 3.5–5.1)
Sodium: 141 mmol/L (ref 135–145)

## 2023-01-01 LAB — CBC
HCT: 33.6 % — ABNORMAL LOW (ref 39.0–52.0)
Hemoglobin: 11.3 g/dL — ABNORMAL LOW (ref 13.0–17.0)
MCH: 31.1 pg (ref 26.0–34.0)
MCHC: 33.6 g/dL (ref 30.0–36.0)
MCV: 92.6 fL (ref 80.0–100.0)
Platelets: 359 10*3/uL (ref 150–400)
RBC: 3.63 MIL/uL — ABNORMAL LOW (ref 4.22–5.81)
RDW: 13.8 % (ref 11.5–15.5)
WBC: 8.4 10*3/uL (ref 4.0–10.5)
nRBC: 0 % (ref 0.0–0.2)

## 2023-01-01 LAB — MAGNESIUM: Magnesium: 2.3 mg/dL (ref 1.7–2.4)

## 2023-01-01 NOTE — Progress Notes (Signed)
Mobility Specialist Progress Note   01/01/23 1216  Mobility  Activity Ambulated with assistance in hallway  Level of Assistance Standby assist, set-up cues, supervision of patient - no hands on  Assistive Device Front wheel walker  Distance Ambulated (ft) 160 ft  Range of Motion/Exercises Active;All extremities  Activity Response Tolerated well   Patient received in recliner and agreeable to participate. Stood independently and ambulated supervision level with slow steady gait. Reported pain in R-hip and muscular pulling/straining in the groin of RLE. Returned to room without incident. Was left in recliner with all needs met, call bell in reach.   [name], BS EXP Mobility Specialist Please contact via SecureChat or Rehab office at [phone]

## 2023-01-01 NOTE — Progress Notes (Signed)
[facility] Progress Note Patient: Larry Elliott [id number] DOB: Dec 03, 1945 DOA: 12/28/2022  DOS: the patient was seen and examined on 01/01/2023  Brief hospital course: Larry Elliott is a 77 y.o. male with medical history significant for prostate cancer, basal cell carcinoma, melanoma, coronary artery disease, hypertension, hyperlipidemia, status post left total hip arthroplasty done outpatient on 12/27/2022 by Dr. Rhona Raider, who presented to [facility] ED from home due to sudden onset hypotension and lethargy after receiving a muscle relaxant for pain.  Suspect this is medication side effect.  Assessment and Plan: Hypotension Acute toxic encephalopathy in the setting of Zanaflex Secondary to medication side effect. Presents with confusion and unresponsive episode.  Was found severely hypotensive. Received IV fluid bolus and was started on epinephrine drip en route by EMS.  Also received IV fluid boluses in the ED per sepsis protocol.  His blood pressure improved. Patient was able to come off of vasopressor. Currently blood pressure is significantly stable. Mentation improving.  Monitor.   Sepsis ruled out SIRS. Present on admission secondary to leukocytosis and hypotension but most likely secondary to medication side effect. Discontinue antibiotics. So far no growth on the cultures.   AKI, suspect prerenal in the setting of dehydration. Baseline creatinine appears to be 0.7 with GFR greater than 60 Presented with creatinine of 1.23 with GFR of 60. Treated with IV fluids.  Now due to volume overload I will resume his home dose of Lasix.   Mild hypovolemic hyponatremia Corrected.  Monitor.   Use of DOAC, Eliquis Possibly use as DVT prophylaxis. On Eliquis 2.5 mg twice daily.   Recent hip surgery. Notified orthopedics about patient presentation. Will monitor.  PT OT recommends CIR/SNF.   Bilateral pedal edema. Stable.  No tenderness.  Initiating Lasix.  Continue Eliquis.    Noncardiac chest pain. EKG negative.  Troponin negative.  On Eliquis.  Pain resolved.  Monitor.   Insomnia. Possible sleep apnea. Obesity. Reported restless leg. Body mass index is 35.91 kg/m.  Patient is on trazodone to help with his insomnia.  Currently it has worked well in the hospital as well. Outpatient patient is scheduled for a sleep study which I support. Patient reported to have some restless leg during the daytime when he is awake I do not think that this is restless leg syndrome and I do not think that it requires any therapy for now.   Constipation. Possible ileus. X-ray abdomen at the time of admission showed a possibility of an ileus. Currently tolerating oral diet.  No nausea or vomiting.  And having bowel movements. No abdominal pain.   Subjective: No acute complaint.  No nausea no vomiting no fever no chills.  Reports constipation today.  Physical Exam: Clear to auscultation. S1-S2 present. Bowel sound present. Lower extremity edema still present.  Data Reviewed: I have Reviewed nursing notes, Vitals, and Lab results. Since last encounter, pertinent lab results CBC and BMP   .  Recheck BMP.  Disposition: Status is: Inpatient Remains inpatient appropriate because: Need for safe discharge to SNF  apixaban (ELIQUIS) tablet 2.5 mg Start: 12/29/22 1000 apixaban (ELIQUIS) tablet 2.5 mg   Family Communication: Family at bedside Level of care: Med-Surg   Vitals:   01/01/23 0053 01/01/23 0400 01/01/23 0826 01/01/23 1152  BP: (!) 172/94 (!) 142/78 (!) 149/77 (!) 153/94  Pulse: 80 85 80 76  Resp: '20 20 18 18  '$ Temp: 98.3 F (36.8 C) 98.1 F (36.7 C) 98.4 F (36.9 C) 98.1 F (36.7 C)  TempSrc: Oral Oral    SpO2: 96% 94% 93% 97%  Weight:      Height:         Author: Berle Mull, MD 01/01/2023 5:55 PM  Please look on [email or web address] to find out who is on call.

## 2023-01-02 ENCOUNTER — Encounter: Payer: Self-pay | Admitting: Internal Medicine

## 2023-01-02 ENCOUNTER — Other Ambulatory Visit (HOSPITAL_COMMUNITY): Payer: Self-pay

## 2023-01-02 DIAGNOSIS — I959 Hypotension, unspecified: Secondary | ICD-10-CM | POA: Diagnosis not present

## 2023-01-02 DIAGNOSIS — G929 Unspecified toxic encephalopathy: Secondary | ICD-10-CM | POA: Diagnosis present

## 2023-01-02 DIAGNOSIS — E8770 Fluid overload, unspecified: Secondary | ICD-10-CM | POA: Diagnosis present

## 2023-01-02 DIAGNOSIS — N179 Acute kidney failure, unspecified: Secondary | ICD-10-CM | POA: Diagnosis present

## 2023-01-02 LAB — CULTURE, BLOOD (ROUTINE X 2)
Culture: NO GROWTH
Culture: NO GROWTH

## 2023-01-02 LAB — BASIC METABOLIC PANEL
Anion gap: 11 (ref 5–15)
BUN: 13 mg/dL (ref 8–23)
CO2: 22 mmol/L (ref 22–32)
Calcium: 8.4 mg/dL — ABNORMAL LOW (ref 8.9–10.3)
Chloride: 108 mmol/L (ref 98–111)
Creatinine, Ser: 0.81 mg/dL (ref 0.61–1.24)
GFR, Estimated: >60 mL/min (ref >60)
Glucose, Bld: 107 mg/dL — ABNORMAL HIGH (ref 70–99)
Potassium: 3.3 mmol/L — ABNORMAL LOW (ref 3.5–5.1)
Sodium: 141 mmol/L (ref 135–145)

## 2023-01-02 LAB — MAGNESIUM: Magnesium: 2.2 mg/dL (ref 1.7–2.4)

## 2023-01-02 MED ORDER — OXYCODONE-ACETAMINOPHEN 5-325 MG PO TABS: 1.0000 | ORAL_TABLET | Freq: Four times a day (QID) | ORAL | 0 refills | Status: DC | PRN

## 2023-01-02 MED ORDER — POTASSIUM CHLORIDE CRYS ER 20 MEQ PO TBCR: 20.0000 meq | EXTENDED_RELEASE_TABLET | Freq: Two times a day (BID) | ORAL | 0 refills | Status: DC

## 2023-01-02 MED ORDER — FUROSEMIDE 20 MG PO TABS: ORAL_TABLET | ORAL | 1 refills | Status: DC

## 2023-01-02 MED ORDER — POTASSIUM CHLORIDE CRYS ER 20 MEQ PO TBCR
40.0000 meq | EXTENDED_RELEASE_TABLET | ORAL | Status: AC
  Administered 2023-01-02 (×2): 40 meq via ORAL
  Filled 2023-01-02 (×2): qty 2

## 2023-01-02 MED ORDER — MELATONIN 5 MG PO TABS
5.0000 mg | ORAL_TABLET | Freq: Every day | ORAL | 0 refills | Status: AC
  Filled 2023-01-02: qty 30, 30d supply, fill #0

## 2023-01-02 MED ORDER — DOCUSATE SODIUM 100 MG PO CAPS
100.0000 mg | ORAL_CAPSULE | Freq: Two times a day (BID) | ORAL | 0 refills | Status: DC | PRN
  Filled 2023-01-02: qty 10, 5d supply, fill #0

## 2023-01-02 NOTE — Discharge Summary (Addendum)
Physician Discharge Summary   Patient: Larry Elliott MRN: [id number] DOB: 1946/07/15  Admit date:     12/28/2022  Discharge date: 01/02/23  Discharge Physician: Berle Mull  PCP: Colon Branch, MD  Recommendations at discharge: Follow-up with PCP with repeat CBC and BMP. Follow-up with orthopedics as originally scheduled. Discuss duration of apixaban   Follow-up Information     Colon Branch, MD. Schedule an appointment as soon as possible for a visit in 1 week(s).   Specialty: Internal Medicine Why: with CBC and BMP Contact information: [address] [place] [address] [phone]         Melrose Nakayama, MD. Schedule an appointment as soon as possible for a visit in 1 week(s).   Specialty: Orthopedic Surgery Contact information: [place] [address] [phone]                Discharge Diagnoses: Principal Problem:   Hypotension Active Problems:   Hyperlipidemia   Essential hypertension   AMS (altered mental status)   Toxic encephalopathy   AKI (acute kidney injury) ([place])   Volume overload  Hospital Course: Larry Elliott is a 77 y.o. male with medical history significant for prostate cancer, basal cell carcinoma, melanoma, coronary artery disease, hypertension, hyperlipidemia, status post left total hip arthroplasty done outpatient on 12/27/2022 by Dr. Rhona Raider, who presented to [facility] ED from home due to sudden onset hypotension and lethargy after receiving a muscle relaxant for pain.  Suspect this is medication side effect. Assessment and Plan  Hypotension Acute toxic encephalopathy in the setting of Zanaflex Secondary to medication side effect. Presents with confusion and unresponsive episode. Was found severely hypotensive. Received IV fluid bolus and was started on epinephrine drip en route by EMS.  Also received IV fluid boluses in the ED per sepsis protocol.  His blood pressure improved. Patient was able to come off  of vasopressor. Currently blood pressure is significantly stable. Mentation improving.  Monitor.   Sepsis ruled out SIRS. Present on admission secondary to leukocytosis and hypotension but most likely secondary to medication side effect. Discontinue antibiotics. So far no growth on the cultures.   AKI, suspect prerenal in the setting of dehydration. Baseline creatinine appears to be 0.7 with GFR greater than 60 Presented with creatinine of 1.23 with GFR of 60. Treated with IV fluids.  Now due to volume overload I will resume his home dose of Lasix.   Mild hypovolemic hyponatremia Corrected.  Monitor.   Use of DOAC, Eliquis Possibly use as DVT prophylaxis. On Eliquis 2.5 mg twice daily.   Recent hip surgery. Notified orthopedics about patient presentation. Will monitor.  PT OT recommends CIR/SNF.   Bilateral pedal edema.  Volume overload. At baseline the patient does have some pedal edema. Appears to have worsened in the hospital after IV hydration. Initiated on home dose of Lasix. Will provide 40 mg of Lasix for next 1 week followed by returning back to home dose of 20 mg daily Lasix.  Hypokalemia. Secondary to diuresis. Will provide prescription for additional Lasix for next 5 days. Recommend rechecking BMP in 1 week.   Noncardiac chest pain. EKG negative.  Troponin negative.  On Eliquis.  Pain resolved.  Monitor.   Insomnia. Possible sleep apnea. Obesity. Reported restless leg. Body mass index is 35.91 kg/m.  Patient is on trazodone to help with his insomnia.  Currently it has worked well in the hospital as well. Outpatient patient is scheduled for a  sleep study which I support.   Constipation. Possible ileus. X-ray abdomen at the time of admission showed a possibility of an ileus. Currently tolerating oral diet.  No nausea or vomiting.  And having bowel movements. No abdominal pain.    Pain control - [employer] Controlled Substance Reporting System  database was reviewed. and patient was instructed, not to drive, operate heavy machinery, perform activities at heights, swimming or participation in water activities or provide baby-sitting services while on Pain, Sleep and Anxiety Medications; until their outpatient Physician has advised to do so again. Also recommended to not to take more than prescribed Pain, Sleep and Anxiety Medications.  Consultants:  none  Procedures performed:  none  DISCHARGE MEDICATION: Allergies as of 01/02/2023       Reactions   Ambien [zolpidem]    Encephalopathy, admitted 04-2022, symptoms suspected to be from [place]   Aspirin Other (See Comments)   REACTION: swelling in face   Hydrocodone    Insomnia, mild SOB (w/o cough-wheezing)   Penicillins Other (See Comments)   REACTION: swelling in face Did it involve swelling of the face/tongue/throat, SOB, or low BP? Yes Did it involve sudden or severe rash/hives, skin peeling, or any reaction on the inside of your mouth or nose? No Did you need to seek medical attention at a hospital or doctor's office? Yes When did it last happen?      [id number] played Hockey If all above answers are "NO", may proceed with cephalosporin use.        Medication List     STOP taking these medications    enalapril 5 MG tablet Commonly known as: VASOTEC   tiZANidine 4 MG tablet Commonly known as: Zanaflex       TAKE these medications    apixaban 2.5 MG Tabs tablet Commonly known as: Eliquis Take 1 tablet (2.5 mg total) by mouth 2 (two) times daily.   atorvastatin 40 MG tablet Commonly known as: LIPITOR Take 1 tablet (40 mg total) by mouth at bedtime.   docusate sodium 100 MG capsule Commonly known as: COLACE Take 1 capsule (100 mg total) by mouth 2 (two) times daily as needed for mild constipation.   fenofibrate micronized 134 MG capsule Commonly known as: LOFIBRA Take 1 capsule (134 mg total) by mouth daily before breakfast.   furosemide 20 MG  tablet Commonly known as: LASIX Take 2 tablets (40 mg total) by mouth daily for 7 days, THEN 1 tablet (20 mg total) daily. Start taking on: January 02, 2023 What changed: See the new instructions.   melatonin 5 MG Tabs Take 1 tablet (5 mg total) by mouth at bedtime.   oxyCODONE-acetaminophen 5-325 MG tablet Commonly known as: Percocet Take 1 tablet by mouth every 6 (six) hours as needed for moderate pain or severe pain (post op pain). What changed: how much to take   potassium chloride SA 20 MEQ tablet Commonly known as: KLOR-CON M Take 1 tablet (20 mEq total) by mouth 2 (two) times daily for 5 days.   traZODone 100 MG tablet Commonly known as: DESYREL Take 100 mg by mouth at bedtime.       Disposition: SNF Diet recommendation: Cardiac diet  Discharge Exam: Vitals:   01/01/23 1651 01/01/23 1942 01/02/23 0015 01/02/23 0421  BP: (!) 151/86 (!) 164/80 130/66 (!) 146/80  Pulse: 78 92 80 90  Resp: 20 (!) '24 20 18  '$ Temp: 98.2 F (36.8 C) 98.1 F (36.7 C) 97.6 F (36.4 C) 98.7 F (  37.1 C)  TempSrc: Oral Oral Oral Oral  SpO2: 97%  95% 92%  Weight:      Height:       General: Appear in no distress; no visible Abnormal Neck Mass Or lumps, Conjunctiva normal Cardiovascular: S1 and S2 Present, no Murmur, Respiratory: good respiratory effort, Bilateral Air entry present and CTA, no Crackles, no wheezes Abdomen: Bowel Sound present, Non tender  Extremities: bilateral Pedal edema Neurology: alert and oriented to time, place, and person  Filed Weights   12/29/22 0500 12/30/22 0500 12/31/22 1436  Weight: 115.1 kg 113.7 kg 113.5 kg   Condition at discharge: stable  The results of significant diagnostics from this hospitalization (including imaging, microbiology, ancillary and laboratory) are listed below for reference.   Imaging Studies: DG Abd 1 View  Result Date: 12/29/2022 CLINICAL DATA:  Constipation EXAM: ABDOMEN - 1 VIEW COMPARISON:  05/18/2022 FINDINGS: Stomach is not  distended. There are few gas-filled small bowel loops. Gas and stool are present in colon. There is mild dilation of small-bowel loops measuring up to 3.5 cm. There is left hip arthroplasty. Lumbar fusion is seen at L4-L5 level. IMPRESSION: There is presence of gas in few slightly dilated small bowel loops suggesting ileus. Electronically Signed   By: Elmer Picker M.D.   On: 12/29/2022 13:28   DG Chest Portable 1 View  Result Date: 12/28/2022 CLINICAL DATA:  Loss of consciousness, hypotension EXAM: PORTABLE CHEST 1 VIEW COMPARISON:  11/24/2022 FINDINGS: Cardiomegaly. Chronic bibasilar opacities could reflect atelectasis or scarring. No effusions. No acute bony abnormality. IMPRESSION: No active disease.  Chronic bibasilar atelectasis or scarring. Electronically Signed   By: Rolm Baptise M.D.   On: 12/28/2022 19:28   DG HIP UNILAT WITH PELVIS 1V LEFT  Result Date: 12/27/2022 CLINICAL DATA:  Left hip arthroplasty EXAM: DG HIP (WITH OR WITHOUT PELVIS) 1V*L* intraoperative fluoroscopy COMPARISON:  None Available. FINDINGS: Two 2 fluoroscopic spot images submitted for review demonstrates left hip arthroplasty with Press-Fit components. Expected alignment. Imaging was obtained to aid in treatment. Please correlate with real-time fluoroscopy of 26.7 seconds. Cumulative air kerma 5.7960 mGy IMPRESSION: Intraoperative fluoroscopy Electronically Signed   By: Jill Side M.D.   On: 12/27/2022 11:16   DG C-Arm 1-60 Min-No Report  Result Date: 12/27/2022 Fluoroscopy was utilized by the requesting physician.  No radiographic interpretation.   DG C-Arm 1-60 Min-No Report  Result Date: 12/27/2022 Fluoroscopy was utilized by the requesting physician.  No radiographic interpretation.    Microbiology: Results for orders placed or performed during the hospital encounter of 12/28/22  Blood culture (routine x 2)     Status: None (Preliminary result)   Collection Time: 12/28/22  8:03 PM   Specimen: BLOOD  Result  Value Ref Range Status   Specimen Description BLOOD SITE NOT SPECIFIED  Final   Special Requests   Final    BOTTLES DRAWN AEROBIC ONLY Blood Culture results may not be optimal due to an inadequate volume of blood received in culture bottles   Culture   Final    NO GROWTH 4 DAYS Performed at [facility], [place] [address], [place], [place] [address]    Report Status PENDING  Incomplete  Blood culture (routine x 2)     Status: None (Preliminary result)   Collection Time: 12/28/22  8:22 PM   Specimen: BLOOD LEFT HAND  Result Value Ref Range Status   Specimen Description BLOOD LEFT HAND  Final   Special Requests   Final  BOTTLES DRAWN AEROBIC AND ANAEROBIC Blood Culture results may not be optimal due to an excessive volume of blood received in culture bottles   Culture   Final    NO GROWTH 4 DAYS Performed at [facility] [address], [place], [place] [address]    Report Status PENDING  Incomplete   Labs: CBC: Recent Labs  Lab 12/28/22 1916 12/29/22 0913 12/30/22 0642 12/31/22 0418 01/01/23 0406  WBC 13.4* 12.5* 11.2* 10.0 8.4  HGB 13.0 11.5* 10.5* 11.4* 11.3*  HCT 39.1 34.0* 31.4* 35.1* 33.6*  MCV 93.1 91.6 92.9 92.9 92.6  PLT 289 253 242 319 [id number]   Basic Metabolic Panel: Recent Labs  Lab 12/29/22 0913 12/30/22 0642 12/31/22 0418 01/01/23 0406 01/02/23 0249  NA 137 139 139 141 141  K 3.4* 3.5 3.4* 3.7 3.3*  CL 105 104 107 107 108  CO2 '23 25 25 24 22  '$ GLUCOSE 108* 114* 114* 109* 107*  BUN '14 10 10 12 13  '$ CREATININE 0.84 0.80 0.75 0.74 0.81  CALCIUM 8.3* 8.2* 8.3* 8.6* 8.4*  MG 2.0 2.0 2.3 2.3 2.2  PHOS 1.5*  --   --   --   --    Liver Function Tests: Recent Labs  Lab 12/28/22 1916  AST 50*  ALT 22  ALKPHOS 45  BILITOT 1.5*  PROT 6.0*  ALBUMIN 3.1*   CBG: Recent Labs  Lab 12/28/22 1922  GLUCAP 135*    Discharge time spent: greater than 30 minutes.  Signed: Berle Mull, MD [facility]

## 2023-01-02 NOTE — TOC Transition Note (Signed)
Transition of Care [facility]) - CM/SW Discharge Note   Patient Details  Name: Larry Elliott MRN: [id number] Date of Birth: June 15, 1946  Transition of Care [facility]) CM/SW Contact:  Geralynn Ochs, LCSW Phone Number: 01/02/2023, 11:56 AM   Clinical Narrative:   CSW confirmed bed availability with [facility], and medical stability with MD. CSW sent discharge information to [facility]. Family to provide transportation.  Nurse to call report to [phone], Room 102.    Final next level of care: Skilled Nursing Facility Barriers to Discharge: Barriers Resolved   Patient Goals and CMS Choice CMS Medicare.gov Compare Post Acute Care list provided to:: Patient Represenative (must comment) Choice offered to / list presented to : Adult Children  Discharge Placement                Patient chooses bed at: [facility] Patient to be transferred to facility by: Family Name of family member notified: [name] Patient and family notified of of transfer: 01/02/23  Discharge Plan and Services Additional resources added to the After Visit Summary for   In-house Referral: Clinical Social Work Discharge Planning Services: CM Consult Post Acute Care Choice: [facility]                               Social Determinants of Health (SDOH) Interventions SDOH Screenings   Food Insecurity: No Food Insecurity (12/29/2022)  Housing: Low Risk  (12/29/2022)  Transportation Needs: No Transportation Needs (12/29/2022)  Utilities: Not At Risk (12/29/2022)  Depression (PHQ2-9): Low Risk  (11/24/2022)  Tobacco Use: Low Risk  (12/29/2022)     Readmission Risk Interventions     No data to display

## 2023-01-02 NOTE — Care Management Important Message (Signed)
Important Message  Patient Details  Name: Larry Elliott MRN: [id number] Date of Birth: 1946/01/12   Medicare Important Message Given:  Yes     Orbie Pyo 01/02/2023, 3:18 PM

## 2023-01-02 NOTE — Progress Notes (Signed)
PT Cancellation Note  Patient Details Name: Larry Elliott MRN: [id number] DOB: [birth year]   Cancelled Treatment:    Reason Eval/Treat Not Completed: Other (comment) (pt preparing for d/c to SNF)  Leighton Roach, [place] chat preferred Office [place] 01/02/2023, 12:12 PM

## 2023-01-02 NOTE — Progress Notes (Signed)
Discharge instructions given to daughter for receiving facility. DNR and prescriptions placed in packet. IV's removed.  Gwendolyn Grant, RN

## 2023-01-02 NOTE — Progress Notes (Signed)
Nursing Discharge Note   Admit Date:    Discharge date:    Larry Elliott is to be discharged to a [place]: [facility]  per MD order.  AVS completed, placed in discharge packet for facility review. Discharge packet compiled for facility. Non-emergency ambulance transport arranged. Report called to [employer] by [employer] at [phone].    Allergies as of 01/02/2023       Reactions   Ambien [zolpidem]    Encephalopathy, admitted 04-2022, symptoms suspected to be from [place]   Aspirin Other (See Comments)   REACTION: swelling in face   Hydrocodone    Insomnia, mild SOB (w/o cough-wheezing)   Penicillins Other (See Comments)   REACTION: swelling in face Did it involve swelling of the face/tongue/throat, SOB, or low BP? Yes Did it involve sudden or severe rash/hives, skin peeling, or any reaction on the inside of your mouth or nose? No Did you need to seek medical attention at a hospital or doctor's office? Yes When did it last happen?      [id number] played Hockey If all above answers are "NO", may proceed with cephalosporin use.        Medication List     STOP taking these medications    enalapril 5 MG tablet Commonly known as: VASOTEC   tiZANidine 4 MG tablet Commonly known as: Zanaflex       TAKE these medications    apixaban 2.5 MG Tabs tablet Commonly known as: Eliquis Take 1 tablet (2.5 mg total) by mouth 2 (two) times daily.   atorvastatin 40 MG tablet Commonly known as: LIPITOR Take 1 tablet (40 mg total) by mouth at bedtime.   docusate sodium 100 MG capsule Commonly known as: COLACE Take 1 capsule (100 mg total) by mouth 2 (two) times daily as needed for mild constipation.   fenofibrate micronized 134 MG capsule Commonly known as: LOFIBRA Take 1 capsule (134 mg total) by mouth daily before breakfast.   furosemide 20 MG tablet Commonly known as: LASIX Take 2 tablets (40 mg total) by mouth daily for 7  days, THEN 1 tablet (20 mg total) daily. Start taking on: January 02, 2023 What changed: See the new instructions.   melatonin 5 MG Tabs Take 1 tablet (5 mg total) by mouth at bedtime.   oxyCODONE-acetaminophen 5-325 MG tablet Commonly known as: Percocet Take 1 tablet by mouth every 6 (six) hours as needed for moderate pain or severe pain (post op pain). What changed: how much to take   potassium chloride SA 20 MEQ tablet Commonly known as: KLOR-CON M Take 1 tablet (20 mEq total) by mouth 2 (two) times daily for 5 days.   traZODone 100 MG tablet Commonly known as: DESYREL Take 100 mg by mouth at bedtime.       Discharge Instructions     Diet - low sodium heart healthy   Complete by: As directed    Increase activity slowly   Complete by: As directed         Patient's daughter at bedside [name] to provide transportation to facility for patient.   Patient discharged from hospital unit via wheelchair to private vehicle. Stable at time of discharge.

## 2023-01-03 ENCOUNTER — Ambulatory Visit: Payer: Medicare Other | Admitting: Interventional Cardiology

## 2023-01-04 ENCOUNTER — Ambulatory Visit (HOSPITAL_BASED_OUTPATIENT_CLINIC_OR_DEPARTMENT_OTHER): Payer: Medicare Other

## 2023-01-04 DIAGNOSIS — R600 Localized edema: Secondary | ICD-10-CM | POA: Diagnosis not present

## 2023-01-04 DIAGNOSIS — Z4789 Encounter for other orthopedic aftercare: Secondary | ICD-10-CM | POA: Diagnosis not present

## 2023-01-04 DIAGNOSIS — M25552 Pain in left hip: Secondary | ICD-10-CM | POA: Diagnosis not present

## 2023-01-04 DIAGNOSIS — R531 Weakness: Secondary | ICD-10-CM | POA: Diagnosis not present

## 2023-01-04 MED ORDER — TRAZODONE HCL 100 MG PO TABS: 100.0000 mg | ORAL_TABLET | Freq: Every day | ORAL | 5 refills | Status: DC

## 2023-01-04 NOTE — Addendum Note (Signed)
Addended by: Kathlene November E on: 01/04/2023 09:50 AM   Modules accepted: Orders

## 2023-01-06 DIAGNOSIS — M1612 Unilateral primary osteoarthritis, left hip: Secondary | ICD-10-CM | POA: Diagnosis not present

## 2023-01-09 ENCOUNTER — Encounter: Payer: Self-pay | Admitting: Family Medicine

## 2023-01-09 ENCOUNTER — Ambulatory Visit (INDEPENDENT_AMBULATORY_CARE_PROVIDER_SITE_OTHER): Payer: Medicare Other | Admitting: Family Medicine

## 2023-01-09 VITALS — BP 142/80 | HR 81 | Resp 18 | Ht 70.0 in | Wt 246.6 lb

## 2023-01-09 DIAGNOSIS — R600 Localized edema: Secondary | ICD-10-CM

## 2023-01-09 NOTE — Progress Notes (Signed)
   Acute Office Visit  Subjective:     Patient ID: Larry Elliott, male    DOB: 1946/03/12, 77 y.o.   MRN: [id number]  Chief Complaint  Patient presents with   Foot Swelling    Had hip surgery 12/27/2022 Blisters on both feet with fluid leaking     HPI Patient is in today for bilateral lower extremity edema. He is here with his son.   Patient had left total hip arthroplasty on 12/27/22. They told him that there would be some swelling to his legs during recovery, but he has noticed it progressively worsening over the past few days. Last night it was significant enough that there was foot pain preventing him from sleeping. He has started noticing some fluid filled blisters around his ankles and has had some weeping. No erythema or calf pain. States he sits in a recliner during the day but has not been elevating his legs. He was wearing compression socks until yesterday when they removed them so he could shower. They were not able to replace them due to the discomfort and swelling. He denies any fevers, chills, chest pain, dyspnea.     ROS All review of systems negative except what is listed in the HPI      Objective:    BP (!) 142/80   Pulse 81   Resp 18   Ht 5\' 10"  (1.778 m)   Wt 246 lb 9.6 oz (111.9 kg)   SpO2 95%   BMI 35.38 kg/m    Physical Exam Vitals reviewed.  Constitutional:      Appearance: Normal appearance.  Cardiovascular:     Rate and Rhythm: Normal rate and regular rhythm.  Pulmonary:     Effort: Pulmonary effort is normal.     Breath sounds: Normal breath sounds.  Musculoskeletal:     Comments: BLE 3+  Skin:    General: Skin is warm and dry.     Capillary Refill: Capillary refill takes 2 to 3 seconds.  Neurological:     Mental Status: He is alert and oriented to person, place, and time.  Psychiatric:        Mood and Affect: Mood normal.        Behavior: Behavior normal.        Thought Content: Thought content normal.        Judgment: Judgment  normal.     No results found for any visits on 01/09/23.      Assessment & Plan:   Problem List Items Addressed This Visit   None Visit Diagnoses     Bilateral lower extremity edema    -  Primary   Relevant Orders   Basic metabolic panel   CBC with Differential/Platelet   B Nat Peptide     Increase your lasix (furosemide) to 40 mg daily for the next 3 days then go back to the 20 mg daily. Elevated your leg as much as possible. Resume compression socks as soon as you can tolerate.  Minimize any sodium in your diet. Updating labs today Follow-up at the end of the week to reassess swelling.  If you notice any change in the colors of your toes, shortness of breath, chest pain, etc go to the ED.  Discussed case with PCP, Dr Larose Kells.  No orders of the defined types were placed in this encounter.   Return in about 4 days (around 01/13/2023) for edema follow-up.  Terrilyn Saver, NP

## 2023-01-09 NOTE — Patient Instructions (Addendum)
Increase your lasix (furosemide) to 40 mg daily for the next 3 days then go back to the 20 mg daily. Elevated your leg as much as possible. Resume compression socks as soon as you can tolerate.  Minimize any sodium in your diet. Updating labs today Follow-up at the end of the week to reassess swelling.  If you notice any change in the colors of your toes, shortness of breath, chest pain, etc go to the ED.

## 2023-01-10 DIAGNOSIS — R262 Difficulty in walking, not elsewhere classified: Secondary | ICD-10-CM | POA: Diagnosis not present

## 2023-01-10 DIAGNOSIS — Z96642 Presence of left artificial hip joint: Secondary | ICD-10-CM | POA: Diagnosis not present

## 2023-01-10 DIAGNOSIS — M25552 Pain in left hip: Secondary | ICD-10-CM | POA: Diagnosis not present

## 2023-01-10 DIAGNOSIS — Z4789 Encounter for other orthopedic aftercare: Secondary | ICD-10-CM | POA: Diagnosis not present

## 2023-01-10 LAB — CBC WITH DIFFERENTIAL/PLATELET
Basophils Absolute: 0.1 10*3/uL (ref 0.0–0.1)
Basophils Relative: 0.6 % (ref 0.0–3.0)
Eosinophils Absolute: 0.3 10*3/uL (ref 0.0–0.7)
Eosinophils Relative: 2.9 % (ref 0.0–5.0)
HCT: 38.7 % — ABNORMAL LOW (ref 39.0–52.0)
Hemoglobin: 12.7 g/dL — ABNORMAL LOW (ref 13.0–17.0)
Lymphocytes Relative: 10.8 % — ABNORMAL LOW (ref 12.0–46.0)
Lymphs Abs: 1 10*3/uL (ref 0.7–4.0)
MCHC: 32.8 g/dL (ref 30.0–36.0)
MCV: 92.1 fl (ref 78.0–100.0)
Monocytes Absolute: 0.7 10*3/uL (ref 0.1–1.0)
Monocytes Relative: 7.8 % (ref 3.0–12.0)
Neutro Abs: 7.4 10*3/uL (ref 1.4–7.7)
Neutrophils Relative %: 77.9 % — ABNORMAL HIGH (ref 43.0–77.0)
Platelets: 619 10*3/uL — ABNORMAL HIGH (ref 150.0–400.0)
RBC: 4.2 Mil/uL — ABNORMAL LOW (ref 4.22–5.81)
RDW: 14.4 % (ref 11.5–15.5)
WBC: 9.5 10*3/uL (ref 4.0–10.5)

## 2023-01-10 LAB — BASIC METABOLIC PANEL
BUN: 14 mg/dL (ref 6–23)
CO2: 27 mEq/L (ref 19–32)
Calcium: 9.3 mg/dL (ref 8.4–10.5)
Chloride: 103 mEq/L (ref 96–112)
Creatinine, Ser: 0.84 mg/dL (ref 0.40–1.50)
GFR: 83.14 mL/min (ref >60.00)
Glucose, Bld: 94 mg/dL (ref 70–99)
Potassium: 4.8 mEq/L (ref 3.5–5.1)
Sodium: 142 mEq/L (ref 135–145)

## 2023-01-10 LAB — BRAIN NATRIURETIC PEPTIDE: Pro B Natriuretic peptide (BNP): 14 pg/mL (ref 0.0–100.0)

## 2023-01-12 DIAGNOSIS — R600 Localized edema: Secondary | ICD-10-CM | POA: Diagnosis not present

## 2023-01-12 DIAGNOSIS — Z4789 Encounter for other orthopedic aftercare: Secondary | ICD-10-CM | POA: Diagnosis not present

## 2023-01-12 DIAGNOSIS — M25552 Pain in left hip: Secondary | ICD-10-CM | POA: Diagnosis not present

## 2023-01-12 DIAGNOSIS — Z96642 Presence of left artificial hip joint: Secondary | ICD-10-CM | POA: Diagnosis not present

## 2023-01-13 ENCOUNTER — Encounter: Payer: Self-pay | Admitting: Internal Medicine

## 2023-01-13 ENCOUNTER — Ambulatory Visit (INDEPENDENT_AMBULATORY_CARE_PROVIDER_SITE_OTHER): Payer: Medicare Other | Admitting: Internal Medicine

## 2023-01-13 VITALS — BP 142/68 | HR 82 | Temp 98.0°F | Resp 22 | Ht 70.0 in | Wt 247.0 lb

## 2023-01-13 DIAGNOSIS — R600 Localized edema: Secondary | ICD-10-CM

## 2023-01-13 MED ORDER — FUROSEMIDE 40 MG PO TABS: 40.0000 mg | ORAL_TABLET | Freq: Two times a day (BID) | ORAL | 0 refills | Status: DC

## 2023-01-13 MED ORDER — POTASSIUM CHLORIDE CRYS ER 20 MEQ PO TBCR: 20.0000 meq | EXTENDED_RELEASE_TABLET | Freq: Once | ORAL | 0 refills | Status: DC

## 2023-01-13 NOTE — Patient Instructions (Addendum)
For the next 5 days: Lasix 40 mg 1 tablet twice daily Potassium 20 meq  1 tablet daily.  Strict leg elevation  Weight yourself daily   ER if: No improvement or not losing weight in next 2 to 3 days If you develop fever chills or leg redness Increase shortness of breath Chest pain   Come back in 5 days

## 2023-01-13 NOTE — Progress Notes (Unsigned)
Subjective:    Patient ID: Larry Elliott, male    DOB: May 10, 1946, 77 y.o.   MRN: [id number]  DOS:  01/13/2023 Type of visit - description: Follow-up, here with his son Last office visit with me 11/29/2022.  Chart reviewed:  - Left total hip arthroplasty 12/27/2022  -Admitted to the hospital shortly after the left hip surgery on 12/28/2022. Presented to the ER with hypotension and lethargy immediately after taking a muscle relaxant. DX: Hypotension Acute toxic encephalopathy in the setting of taking Zanaflex. Received IV fluids, epinephrine drip. Subsequently improved.  No evidence of sepsis.  Creatinine was slightly elevated. They noted edema worsening during the hospital stay possibly from IV hydration. Discharge home 01/02/2023.  -Subsequently was seen here at this office 01/09/2023, due to increasing swelling. CBC okay except for increased platelet count, possibly a reaction from recent surgery. BNP and BMP okay Lasix was increased to 40 mg x 3 days then go back to 20 daily.  Review of Systems Here for follow-up. Doing about the same. No fever chills No cough Some shortness of breath Patient reports leg elevation, his son is not sure of compliance.    Past Medical History:  Diagnosis Date   Arthritis    BCC (basal cell carcinoma), leg    Right calf, L nape of neck at hairline   CAD (coronary artery disease)    CP, cath, non-obstructive   Diverticulosis    colon   Erectile dysfunction following radical prostatectomy    Hyperlipidemia    Hypertension    Insomnia    Melanoma ([facility]) 2010   face, sees derm    Prostate CA ([place]) 07/2007   s/p robotic surgery   Spermatocele    Left    Past Surgical History:  Procedure Laterality Date   APPENDECTOMY     age 14   HERNIA REPAIR  2010   KNEE ARTHROSCOPY Left 1965   meniscal tear   PROSTATECTOMY  07/25/2007    robotic,  for prostate cancer     SHOULDER ARTHROSCOPY Left 07/09/2013   Dr Tamera Punt    TOTAL HIP  ARTHROPLASTY Left 12/27/2022   Procedure: LEFT TOTAL HIP ARTHROPLASTY ANTERIOR APPROACH;  Surgeon: Melrose Nakayama, MD;  Location: [facility];  Service: Orthopedics;  Laterality: Left;   TRANSFORAMINAL LUMBAR INTERBODY FUSION (TLIF) WITH PEDICLE SCREW FIXATION 1 LEVEL Right 12/04/2019   Procedure: RIGHT-SIDED TRANSFORAMINAL LUMBAR INTERBODY FUSION LUMBAR FOUR THROUGH FIVE WITH INSTRUMENTATION AND ALLOGRAFT;  Surgeon: Phylliss Bob, MD;  Location: [facility];  Service: Orthopedics;  Laterality: Right;    Current Outpatient Medications  Medication Instructions   apixaban (ELIQUIS) 2.5 mg, Oral, 2 times daily   atorvastatin (LIPITOR) 40 mg, Oral, Daily at bedtime   docusate sodium (COLACE) 100 mg, Oral, 2 times daily PRN   fenofibrate micronized (LOFIBRA) 134 mg, Oral, Daily before breakfast   furosemide (LASIX) 20 MG tablet Take 2 tablets (40 mg total) by mouth daily for 7 days, THEN 1 tablet (20 mg total) daily.   melatonin 5 mg, Oral, Daily at bedtime   oxyCODONE-acetaminophen (PERCOCET) 5-325 MG tablet 1 tablet, Oral, Every 6 hours PRN   potassium chloride SA (KLOR-CON M) 20 MEQ tablet 20 mEq, Oral, 2 times daily   traZODone (DESYREL) 100 mg, Oral, Daily at bedtime       Objective:   Physical Exam BP (!) 146/80   Pulse 82   Temp 98 F (36.7 C) (Oral)   Resp (!) 22   Ht 5\' 10"  (1.778  m)   Wt 247 lb (112 kg)   SpO2 95%   BMI 35.44 kg/m  General:   Well developed, NAD, BMI noted. HEENT:  Normocephalic . Face symmetric, atraumatic Lungs:  CTA B Normal respiratory effort, no intercostal retractions, no accessory muscle use. Heart: RRR,  no murmur.  Lower extremities: Significant edema from the knees down.  Calf nontender to palpation.  They were measured and they are symmetric. He also has pedal edema.   Has some open blisters behind  the right calf that are oozing clear material.  Neurologic:  alert & oriented X3.  Speech normal, gait: Assisted by walker psych--  Cognition and  judgment appear intact.  Cooperative with normal attention span and concentration.  Behavior appropriate. No anxious or depressed appearing.      Assessment     Assessment  Hyperglycemia   HTN Hyperlipidemia Anxiety depression insomnia   dc clonazepam - not effective,  8-16, rx trazodone. Metabolic encephalopathy admitted July 2023: No more Ambien  CAD, non-obstructive Prostate cancer-- 2008, robotic surgery, no incontinence, Dr Alinda Money: now f/u per PCP as of 05-2018 [facility] ED past surgery  spermatocele, left Skin --Melanoma 2010, BCCs  does not see derm regularly as off 01-2022 LE edema R>L (-) US DVT 2020 ABI 09/2019 WNL  PLAN Lower extremity edema:  Since the last visit had hip surgery, was admitted to the and subsequently seen in this office for hospital follow-up. Extensive chart review. In summary he has developed significant lower extremity edema since the admission for hypotension, he received a significant amount of IV fluids. Was seen at this office, Lasix increased temporarily.  No improvement.  He is taking Eliquis, legs are swollen but symmetric.  Recent BMP okay.   Plan: Lasix 40 BID for 5 days.  Prescription sent. Potassium 1 tablet twice daily Strict leg elevation Continue anticoagulation Follow-up 5 days ER if edema not improving in the next 2 to 3 days, shortness of breath, fever, redness.        : New onset CHF?  See LOV, Pt was sent to the ER with question of CHF, workup was negative.  He is here for follow-up. In general feeling about the same however today I noticed no wheezing, no JVD, weight has decreased and he has less edema. I wonder if he was not compliant with Lasix and Vasotec for a while which triggered some volume overload. HTN: BP slightly low today, the daughter and son are now helping the patient with medication compliance,  continue enalapril and Lasix. Anxiety depression insomnia: On chart review: - Clonazepam was not effective -  Was on Lexapro until approximately 2018, it was stopped for an unclear reason and started on trazodone.  We could try SSRIs again if needed. - Developed metabolic encephalopathy July 2023, in part related to Ambien  . Currently on trazodone 100 mg and Tranxene 7.5 mg. Mood is okay but insomnia remains an issue. Plan:  Stop all OTCs, increase trazodone 100 mg to 1.5 tablets daily, continue low-dose Tranxene, avoid naps, refer to neuro Hip pain:  That is at least partially responsible for his difficulty sleeping. Refer back to Ortho, previously benefited from injections, will also need PT. Avoid painkillers, Tylenol as needed. RTC 2 months

## 2023-01-14 NOTE — Assessment & Plan Note (Signed)
Lower extremity edema:  Since the last visit had hip surgery, was admitted to the and subsequently seen in this office for hospital follow-up. Extensive chart review. In summary he has developed significant lower extremity edema since the admission for hypotension, he received a significant amount of IV fluids. Was seen at this office, Lasix increased temporarily.  No improvement.  He is taking Eliquis, legs are swollen but symmetric.  Recent BMP okay. On lasix 20 mg qd, plan: Lasix 40 BID for 5 days.  Prescription sent. Potassium 1 tablet twice daily Strict leg elevation Continue anticoagulation Follow-up 5 days ER if edema not improving in the next 2 to 3 days, shortness of breath, fever, redness.

## 2023-01-17 DIAGNOSIS — R600 Localized edema: Secondary | ICD-10-CM | POA: Diagnosis not present

## 2023-01-17 DIAGNOSIS — R531 Weakness: Secondary | ICD-10-CM | POA: Diagnosis not present

## 2023-01-17 DIAGNOSIS — Z96642 Presence of left artificial hip joint: Secondary | ICD-10-CM | POA: Diagnosis not present

## 2023-01-17 DIAGNOSIS — R262 Difficulty in walking, not elsewhere classified: Secondary | ICD-10-CM | POA: Diagnosis not present

## 2023-01-18 ENCOUNTER — Encounter: Payer: Self-pay | Admitting: Internal Medicine

## 2023-01-18 ENCOUNTER — Ambulatory Visit (INDEPENDENT_AMBULATORY_CARE_PROVIDER_SITE_OTHER): Payer: Medicare Other | Admitting: Internal Medicine

## 2023-01-18 ENCOUNTER — Other Ambulatory Visit: Payer: Self-pay

## 2023-01-18 ENCOUNTER — Ambulatory Visit (HOSPITAL_BASED_OUTPATIENT_CLINIC_OR_DEPARTMENT_OTHER)
Admission: RE | Admit: 2023-01-18 | Discharge: 2023-01-18 | Disposition: A | Discharge status: Home / Self Care | Payer: Medicare Other | Source: Ambulatory Visit | Attending: Internal Medicine | Admitting: Internal Medicine

## 2023-01-18 VITALS — BP 136/72 | HR 85 | Temp 97.8°F | Resp 22 | Ht 70.0 in | Wt 249.5 lb

## 2023-01-18 DIAGNOSIS — R600 Localized edema: Secondary | ICD-10-CM

## 2023-01-18 MED ORDER — POTASSIUM CHLORIDE CRYS ER 20 MEQ PO TBCR: 20.0000 meq | EXTENDED_RELEASE_TABLET | Freq: Two times a day (BID) | ORAL | 0 refills | Status: DC

## 2023-01-18 NOTE — Progress Notes (Unsigned)
Subjective:    Patient ID: Larry Elliott, male    DOB: 1946-10-18, 77 y.o.   MRN: [id number]  DOS:  01/18/2023 Type of visit - description: Follow-up, here with his son  They report good compliance with Lasix as recommended. Edema is perhaps slightly better. Poor compliance with leg elevation In the last 3 days he consumed 2 gallons of chocolate milk.  Today for the first time he mentioned mid abdominal pain --- for how long? Not burning-like.  No nausea vomiting.  No constipation.  No diarrhea or blood in the stools No fever or chills.  Wt Readings from Last 3 Encounters:  01/18/23 249 lb 8 oz (113.2 kg)  01/13/23 247 lb (112 kg)  01/09/23 246 lb 9.6 oz (111.9 kg)     Review of Systems See above   Past Medical History:  Diagnosis Date   Arthritis    BCC (basal cell carcinoma), leg    Right calf, L nape of neck at hairline   CAD (coronary artery disease)    CP, cath, non-obstructive   Diverticulosis    colon   Erectile dysfunction following radical prostatectomy    Hyperlipidemia    Hypertension    Insomnia    Melanoma ([place]) 2010   face, sees derm    Prostate CA ([facility]) 07/2007   s/p robotic surgery   Spermatocele    Left    Past Surgical History:  Procedure Laterality Date   APPENDECTOMY     age 66   HERNIA REPAIR  2010   KNEE ARTHROSCOPY Left 1965   meniscal tear   PROSTATECTOMY  07/25/2007    robotic,  for prostate cancer     SHOULDER ARTHROSCOPY Left 07/09/2013   Dr Tamera Punt    TOTAL HIP ARTHROPLASTY Left 12/27/2022   Procedure: LEFT TOTAL HIP ARTHROPLASTY ANTERIOR APPROACH;  Surgeon: Melrose Nakayama, MD;  Location: [facility];  Service: Orthopedics;  Laterality: Left;   TRANSFORAMINAL LUMBAR INTERBODY FUSION (TLIF) WITH PEDICLE SCREW FIXATION 1 LEVEL Right 12/04/2019   Procedure: RIGHT-SIDED TRANSFORAMINAL LUMBAR INTERBODY FUSION LUMBAR FOUR THROUGH FIVE WITH INSTRUMENTATION AND ALLOGRAFT;  Surgeon: Phylliss Bob, MD;  Location: [facility];  Service:  Orthopedics;  Laterality: Right;    Current Outpatient Medications  Medication Instructions   apixaban (ELIQUIS) 2.5 mg, Oral, 2 times daily   atorvastatin (LIPITOR) 40 mg, Oral, Daily at bedtime   docusate sodium (COLACE) 100 mg, Oral, 2 times daily PRN   fenofibrate micronized (LOFIBRA) 134 mg, Oral, Daily before breakfast   furosemide (LASIX) 20 MG tablet Take 2 tablets (40 mg total) by mouth daily for 7 days, THEN 1 tablet (20 mg total) daily.   furosemide (LASIX) 40 mg, Oral, 2 times daily   melatonin 5 mg, Oral, Daily at bedtime   oxyCODONE-acetaminophen (PERCOCET) 5-325 MG tablet 1 tablet, Oral, Every 6 hours PRN   potassium chloride SA (KLOR-CON M) 20 MEQ tablet 20 mEq, Oral, 2 times daily   traZODone (DESYREL) 100 mg, Oral, Daily at bedtime       Objective:   Physical Exam BP 136/72   Pulse 85   Temp 97.8 F (36.6 C) (Oral)   Resp (!) 22   Ht 5\' 10"  (1.778 m)   Wt 249 lb 8 oz (113.2 kg)   SpO2 95%   BMI 35.80 kg/m  General:   Well developed, NAD, BMI noted. HEENT:  Normocephalic . Face symmetric, atraumatic Lungs:  CTA B Normal respiratory effort, no intercostal retractions, no accessory muscle use.  Heart: RRR,  no murmur. Abdomen: Soft, minimally tender at the epigastric area.  No abdominal wall edema. Lower extremities: At simple inspection, swelling seems less. Edema is from the knees down. Previous seen a skin lesion seem to be healing.  Minimal oozing noted today. Skin: Not pale. Not jaundice Neurologic:  alert & oriented X3.  Speech normal, gait assisted by a walker.   Psych--  Cognition and judgment appear intact.  Cooperative with normal attention span and concentration.  Behavior appropriate. No anxious or depressed appearing.      Assessment     Assessment  Hyperglycemia   HTN Hyperlipidemia Anxiety depression insomnia   dc clonazepam - not effective,  8-16, rx trazodone. Metabolic encephalopathy admitted July 2023: No more Ambien   CAD, non-obstructive Prostate cancer-- 2008, robotic surgery, no incontinence, Dr Alinda Money: now f/u per PCP as of 05-2018 [facility] ED past surgery  spermatocele, left Skin --Melanoma 2010, BCCs  does not see derm regularly as off 01-2022 LE edema R>L (-) US DVT 2020 ABI 09/2019 WNL  PLAN Lower extremity edema: See LOV, edema was significant, Lasix dose increased to 40 mg twice daily, he has been taking it as prescribed according to the son.  Although the weight has increased, on inspection the swelling seems better . Plan: Lasix 40 mg today and tomorrow morning, check BMP, further advice regards diuretics with results. Refer to cardiology to assist on edema management Check ultrasound of both legs to r/o DVT Will communicate with daughter [name] regards instructions. RTC depending on results.

## 2023-01-18 NOTE — Patient Instructions (Addendum)
Proceed with blood work today  Go to the first floor, arrange for the ultrasound of your legs to be sure you don't have a clot   Okay to take Lasix 40 mg tonight and tomorrow morning Will call you tomorrow morning and give you further instructions   Do not drink excessive fluids particularly not chocolate milk. Continue avoiding excessive salt You must lay down in your bed with your feet elevated.  We are referring you to cardiology, hopefully they will be able to see you next week  Go to the ER if: Chest pain, shortness of breath, increasing swelling.

## 2023-01-19 DIAGNOSIS — R262 Difficulty in walking, not elsewhere classified: Secondary | ICD-10-CM | POA: Diagnosis not present

## 2023-01-19 DIAGNOSIS — R531 Weakness: Secondary | ICD-10-CM | POA: Diagnosis not present

## 2023-01-19 DIAGNOSIS — R600 Localized edema: Secondary | ICD-10-CM | POA: Diagnosis not present

## 2023-01-19 DIAGNOSIS — M25552 Pain in left hip: Secondary | ICD-10-CM | POA: Diagnosis not present

## 2023-01-19 LAB — BASIC METABOLIC PANEL
BUN: 18 mg/dL (ref 6–23)
CO2: 31 mEq/L (ref 19–32)
Calcium: 9.2 mg/dL (ref 8.4–10.5)
Chloride: 101 mEq/L (ref 96–112)
Creatinine, Ser: 0.79 mg/dL (ref 0.40–1.50)
GFR: 84.68 mL/min (ref >60.00)
Glucose, Bld: 97 mg/dL (ref 70–99)
Potassium: 4.6 mEq/L (ref 3.5–5.1)
Sodium: 140 mEq/L (ref 135–145)

## 2023-01-19 MED ORDER — FUROSEMIDE 40 MG PO TABS: 40.0000 mg | ORAL_TABLET | Freq: Two times a day (BID) | ORAL | 0 refills | Status: DC

## 2023-01-19 NOTE — Assessment & Plan Note (Signed)
Lower extremity edema: See LOV, edema was significant, Lasix dose increased to 40 mg twice daily, he has been taking it as prescribed according to the son.  Although the weight has increased, on inspection the swelling seems better . Plan: Lasix 40 mg today and tomorrow morning, check BMP, further advice regards diuretics with results. Refer to cardiology to assist on edema management Check ultrasound of both legs to r/o DVT Will communicate with daughter [name] regards instructions. RTC depending on results.

## 2023-01-20 ENCOUNTER — Ambulatory Visit (HOSPITAL_BASED_OUTPATIENT_CLINIC_OR_DEPARTMENT_OTHER): Payer: Medicare Other

## 2023-01-20 ENCOUNTER — Encounter: Payer: Self-pay | Admitting: Internal Medicine

## 2023-01-24 ENCOUNTER — Telehealth: Payer: Self-pay | Admitting: *Deleted

## 2023-01-24 DIAGNOSIS — M25552 Pain in left hip: Secondary | ICD-10-CM | POA: Diagnosis not present

## 2023-01-24 DIAGNOSIS — Z96642 Presence of left artificial hip joint: Secondary | ICD-10-CM | POA: Diagnosis not present

## 2023-01-24 DIAGNOSIS — Z4789 Encounter for other orthopedic aftercare: Secondary | ICD-10-CM | POA: Diagnosis not present

## 2023-01-24 DIAGNOSIS — R262 Difficulty in walking, not elsewhere classified: Secondary | ICD-10-CM | POA: Diagnosis not present

## 2023-01-24 NOTE — Telephone Encounter (Signed)
Attempt to call patient to schedule f/u appointment per Dr. Gardiner Rhyme for LE edema-lmtcb.  Called daughter-lmtcb

## 2023-01-25 NOTE — Telephone Encounter (Signed)
Called daughter, advised I would check with Dr.Schumann if earlier appointment than 04/25 was needed, or to keep this appointment. Will route to MD to clarify.  Thanks!

## 2023-01-25 NOTE — Telephone Encounter (Signed)
Pt's daughter is on the line returning call. Pt's daughter has questions about scheduling another appt since the pt has one scheduled on 04/25. Please advise

## 2023-01-25 NOTE — Telephone Encounter (Signed)
PCP sent me a message yesterday would like patient seen sooner than 4/25.  Would either add on for APP this week or we can add on to my schedule when I'm back in clinic next week

## 2023-01-25 NOTE — Telephone Encounter (Signed)
LVM to call office as there is cancellation for tomorrow at 8:00 am if patient can make this appt. With E. Monge.  Waiting on patient to call before scheduling

## 2023-01-26 ENCOUNTER — Institutional Professional Consult (permissible substitution): Payer: Medicare Other | Admitting: Neurology

## 2023-01-26 DIAGNOSIS — R262 Difficulty in walking, not elsewhere classified: Secondary | ICD-10-CM | POA: Diagnosis not present

## 2023-01-26 DIAGNOSIS — R531 Weakness: Secondary | ICD-10-CM | POA: Diagnosis not present

## 2023-01-26 DIAGNOSIS — R600 Localized edema: Secondary | ICD-10-CM | POA: Diagnosis not present

## 2023-01-26 DIAGNOSIS — M25552 Pain in left hip: Secondary | ICD-10-CM | POA: Diagnosis not present

## 2023-01-27 NOTE — Telephone Encounter (Signed)
Pt daughter returning call °

## 2023-01-27 NOTE — Telephone Encounter (Signed)
Daughter called back stating she has been busy and why has not called back.  Looking at schedule, patient is not scheduled until May 10 and she realizes this is correct.  Had thought it was April 25th.  Did offer an appt. For April 16th, but she states unsure if  can bring patient to the appt.  She will ask a friend and if they can bring him, then she will call back to see if it is still available. Otherwise she will keep the May appt.

## 2023-01-27 NOTE — Telephone Encounter (Signed)
2nd attempt to call to give sooner appt.  Opening next week with Dr Bjorn Pippin.  Ask patient to call office

## 2023-01-27 NOTE — Telephone Encounter (Signed)
Left message to call back-need to try to arrange sooner follow up with Dr. Bjorn Pippin.  Scheduling team-please reach out to me on return call.

## 2023-01-27 NOTE — Telephone Encounter (Signed)
Spoke to daughter-appt scheduled. 

## 2023-01-29 ENCOUNTER — Encounter: Payer: Self-pay | Admitting: Internal Medicine

## 2023-01-29 NOTE — Progress Notes (Deleted)
Cardiology Office Note:    Date:  12/21/2022   ID:  Larry Elliott, DOB 1946-03-19, MRN [id number]  PCP:  Wanda PlumpPaz, Jose E, MD  Cardiologist:  None  Electrophysiologist:  None   Referring MD: Wanda PlumpPaz, Jose E, MD   Chief Complaint  Patient presents with   Pre-op Exam    History of Present Illness:    Larry Elliott is a 77 y.o. male with a hx of nonobstructive CAD, hypertension, hyperlipidemia, melanoma, prostate cancer who presents for follow-up.  He was referred by Dr. Drue NovelPaz for preop evaluation prior to hip surgery, initially seen 12/19/2022.  Echocardiogram 04/2022 showed EF 50 to 55%, inferior basal hypokinesis, mild RV dysfunction, ascending aorta measuring 44 mm.  Cath in 2011 showed minimal CAD.    Since last clinic visit,***Larry Elliott  Denies any chest pain, dyspnea, lightheadedness, syncope, lower extremity edema, or palpitations.  Reports he can walk up flight of stairs without stopping.  Denies any exertional symptoms.  No smoking history.  Family history includes father with heart disease but he is unsure of details.      Past Medical History:  Diagnosis Date   Anxiety and depression 11/04/2013   Arthritis    BCC (basal cell carcinoma), leg    Right calf, L nape of neck at hairline   CAD (coronary artery disease)    CP, cath, non-obstructive   Diverticulosis    colon   Erectile dysfunction following radical prostatectomy    Hyperlipidemia    Hypertension    Insomnia    Melanoma ([facility]) 2010   face, sees derm    Prostate CA ([facility]) 10/08   s/p robotic surgery   Spermatocele    Left    Past Surgical History:  Procedure Laterality Date   APPENDECTOMY     HERNIA REPAIR     KNEE ARTHROSCOPY Left    meniscal tear   PROSTATECTOMY  07-2007    robotic,  for prostate cancer     SHOULDER ARTHROSCOPY  07-09-2013   Dr Ave Filterhandler    TRANSFORAMINAL LUMBAR INTERBODY FUSION (TLIF) WITH PEDICLE SCREW FIXATION 1 LEVEL Right 12/04/2019   Procedure: RIGHT-SIDED TRANSFORAMINAL LUMBAR  INTERBODY FUSION LUMBAR FOUR THROUGH FIVE WITH INSTRUMENTATION AND ALLOGRAFT;  Surgeon: Estill Bambergumonski, Mark, MD;  Location: [facility];  Service: Orthopedics;  Laterality: Right;    Current Medications: Current Meds  Medication Sig   atorvastatin (LIPITOR) 40 MG tablet Take 1 tablet (40 mg total) by mouth at bedtime.   clorazepate (TRANXENE) 7.5 MG tablet Take 1 tablet by mouth at bedtime as needed for sleep (Patient not taking: Reported on 12/20/2022)   enalapril (VASOTEC) 5 MG tablet Take 1 tablet (5 mg total) by mouth daily.   fenofibrate micronized (LOFIBRA) 134 MG capsule Take 1 capsule (134 mg total) by mouth daily before breakfast.   fluticasone (FLONASE) 50 MCG/ACT nasal spray Place 2 sprays into both nostrils daily. (Patient not taking: Reported on 12/20/2022)   furosemide (LASIX) 20 MG tablet Take 1 tablet (20 mg total) by mouth daily.   traZODone (DESYREL) 100 MG tablet Take 100 mg by mouth at bedtime.   [DISCONTINUED] Calcium Polycarbophil (FIBER LAXATIVE PO) Take 1 tablet by mouth daily.     Allergies:   Ambien [zolpidem], Aspirin, Hydrocodone, and Penicillins   Social History   Socioeconomic History   Marital status: Married    Spouse name: Not on file   Number of children: 2   Years of education: Not on file   Highest education level:  Not on file  Occupational History   Occupation: retired Emergency planning/management officer   Tobacco Use   Smoking status: Never   Smokeless tobacco: Never  Vaping Use   Vaping Use: Never used  Substance and Sexual Activity   Alcohol use: Yes    Alcohol/week: 7.0 standard drinks of alcohol    Types: 7 Glasses of wine per week    Comment: socially; 1 glass of wine/day   Drug use: Never   Sexual activity: Not on file  Other Topics Concern   Not on file  Social History Narrative   Lives w/ wife    Original from [place]   Social Determinants of Health   Financial Resource Strain: Not on file  Food Insecurity: Not on file  Transportation Needs: Not on file   Physical Activity: Not on file  Stress: Not on file  Social Connections: Not on file     Family History: The patient's family history includes Coronary artery disease (age of onset: 21) in his father; Stroke in his mother. There is no history of Hypertension, Diabetes, Colon cancer, or Prostate cancer.  ROS:   Please see the history of present illness.     All other systems reviewed and are negative.  EKGs/Labs/Other Studies Reviewed:    The following studies were reviewed today:   EKG:   12/19/2022: Normal sinus rhythm, rate 101, Q waves in leads III, aVF  Recent Labs: 06/08/2022: ALT 25; Pro B Natriuretic peptide (BNP) 14.0 10/21/2022: Magnesium 2.0 11/24/2022: B Natriuretic Peptide 19.7; Hemoglobin 14.6; Platelets 296; TSH 2.256 12/19/2022: BUN 27; Creatinine, Ser 1.08; Potassium 4.8; Sodium 145  Recent Lipid Panel    Component Value Date/Time   CHOL 204 (H) 01/25/2022 1524   TRIG 584.0 (H) 01/25/2022 1524   TRIG 276 (HH) 09/05/2006 0835   HDL 47.70 01/25/2022 1524   CHOLHDL 4 01/25/2022 1524   VLDL 70.8 (H) 03/18/2021 1431   LDLCALC 56 02/19/2013 1624   LDLDIRECT 82.0 01/25/2022 1524    Physical Exam:    VS:  BP (!) 149/92   Pulse (!) 101   Ht 5\' 10"  (1.778 m)   Wt 247 lb 9.6 oz (112.3 kg)   SpO2 92%   BMI 35.53 kg/m     Wt Readings from Last 3 Encounters:  12/19/22 247 lb 9.6 oz (112.3 kg)  11/29/22 245 lb 2 oz (111.2 kg)  11/24/22 249 lb 6 oz (113.1 kg)     GEN:  Well nourished, well developed in no acute distress HEENT: Normal NECK: No JVD; No carotid bruits LYMPHATICS: No lymphadenopathy CARDIAC: RRR, no murmurs, rubs, gallops RESPIRATORY:  Clear to auscultation without rales, wheezing or rhonchi  ABDOMEN: Soft, non-tender, non-distended MUSCULOSKELETAL:  No edema; No deformity  SKIN: Warm and dry NEUROLOGIC:  Alert and oriented x 3 PSYCHIATRIC:  Normal affect   ASSESSMENT:    1. Pre-op evaluation   2. Aortic dilatation ([facility]CC)   3. Coronary  artery disease involving native coronary artery of native heart without angina pectoris   4. Essential hypertension   5. Hyperlipidemia, unspecified hyperlipidemia type    PLAN:    Lower extremity edema: Takes Lasix 40 mg twice daily.  Lower extremity duplex 01/18/2023 showed no evidence of DVT.  proBNP 14 on 318  Aortic dilatation: Ascending aorta measured 44 mm on echocardiogram 04/2022.  Recommend CTA chest to evaluate aorta  Nonobstructive CAD: Cath in 2011 showed minimal CAD.  Continue statin  Hyperlipidemia: On atorvastatin 40 mg daily.  LDL 82  on 01/25/22  Hypertension: On enalapril 5 mg daily.  BP elevated in clinic today but reports he did not take his medication.  Asked to check BP twice daily for next week and let us know results  Lower extremity edema: Takes Lasix 20 mg daily.  Mild edema on exam today   RTC in 3 months  Medication Adjustments/Labs and Tests Ordered: Current medicines are reviewed at length with the patient today.  Concerns regarding medicines are outlined above.  Orders Placed This Encounter  Procedures   CT ANGIO CHEST AORTA W/CM & OR WO/CM   Basic metabolic panel   EKG 12-Lead   No orders of the defined types were placed in this encounter.   Patient Instructions  Medication Instructions:  Your physician recommends that you continue on your current medications as directed. Please refer to the Current Medication list given to you today.  *If you need a refill on your cardiac medications before your next appointment, please call your pharmacy*  Testing/Procedures: CTA chest/aorta  Follow-Up: At [facility], you and your health needs are our priority.  As part of our continuing mission to provide you with exceptional heart care, we have created designated Provider Care Teams.  These Care Teams include your primary Cardiologist (physician) and Advanced Practice Providers (APPs -  Physician Assistants and Nurse Practitioners) who all work  together to provide you with the care you need, when you need it.  We recommend signing up for the patient portal called "MyChart".  Sign up information is provided on this After Visit Summary.  MyChart is used to connect with patients for Virtual Visits (Telemedicine).  Patients are able to view lab/test results, encounter notes, upcoming appointments, etc.  Non-urgent messages can be sent to your provider as well.   To learn more about what you can do with MyChart, go to [email or web address].    Your next appointment:   3 month(s)  Provider:   Dr. Bjorn Pippin Other Instructions Please check your blood pressure at home twice daily, write it down.  Call the office or send message via Mychart with the readings in 1 week for Dr. Bjorn Pippin to review.      Signed, Little Ishikawa, MD  12/21/2022 10:27 AM     [facility]

## 2023-02-02 ENCOUNTER — Ambulatory Visit: Payer: Medicare Other | Admitting: Cardiology

## 2023-02-02 ENCOUNTER — Ambulatory Visit: Payer: Medicare Other

## 2023-02-02 ENCOUNTER — Encounter: Payer: Self-pay | Admitting: Cardiology

## 2023-02-03 DIAGNOSIS — Z96642 Presence of left artificial hip joint: Secondary | ICD-10-CM | POA: Diagnosis not present

## 2023-02-03 DIAGNOSIS — R600 Localized edema: Secondary | ICD-10-CM | POA: Diagnosis not present

## 2023-02-03 DIAGNOSIS — R262 Difficulty in walking, not elsewhere classified: Secondary | ICD-10-CM | POA: Diagnosis not present

## 2023-02-03 DIAGNOSIS — R531 Weakness: Secondary | ICD-10-CM | POA: Diagnosis not present

## 2023-02-07 ENCOUNTER — Ambulatory Visit: Payer: Medicare Other | Admitting: Cardiology

## 2023-02-08 ENCOUNTER — Encounter: Payer: Self-pay | Admitting: Cardiology

## 2023-02-08 ENCOUNTER — Ambulatory Visit: Payer: Medicare Other | Attending: Cardiology | Admitting: Cardiology

## 2023-02-08 VITALS — BP 142/88 | HR 92 | Ht 70.0 in | Wt 237.0 lb

## 2023-02-08 DIAGNOSIS — R600 Localized edema: Secondary | ICD-10-CM | POA: Insufficient documentation

## 2023-02-08 DIAGNOSIS — Z79899 Other long term (current) drug therapy: Secondary | ICD-10-CM | POA: Insufficient documentation

## 2023-02-08 DIAGNOSIS — I10 Essential (primary) hypertension: Secondary | ICD-10-CM | POA: Diagnosis not present

## 2023-02-08 DIAGNOSIS — E785 Hyperlipidemia, unspecified: Secondary | ICD-10-CM | POA: Insufficient documentation

## 2023-02-08 DIAGNOSIS — I5032 Chronic diastolic (congestive) heart failure: Secondary | ICD-10-CM | POA: Insufficient documentation

## 2023-02-08 NOTE — Patient Instructions (Signed)
Medication Instructions:  Call or send mychart message when you get home to confirm your dose of Lasix and potassium  *If you need a refill on your cardiac medications before your next appointment, please call your pharmacy*   Lab Work: CMET, Mag today  If you have labs (blood work) drawn today and your tests are completely normal, you will receive your results only by: MyChart Message (if you have MyChart) OR A paper copy in the mail If you have any lab test that is abnormal or we need to change your treatment, we will call you to review the results.  Testing/Procedures: Your physician has requested that you have an echocardiogram. Echocardiography is a painless test that uses sound waves to create images of your heart. It provides your doctor with information about the size and shape of your heart and how well your heart's chambers and valves are working. This procedure takes approximately one hour. There are no restrictions for this procedure. Please do NOT wear cologne, perfume, aftershave, or lotions (deodorant is allowed). Please arrive 15 minutes prior to your appointment time.  Follow-Up: At [facility], you and your health needs are our priority.  As part of our continuing mission to provide you with exceptional heart care, we have created designated Provider Care Teams.  These Care Teams include your primary Cardiologist (physician) and Advanced Practice Providers (APPs -  Physician Assistants and Nurse Practitioners) who all work together to provide you with the care you need, when you need it.  We recommend signing up for the patient portal called "MyChart".  Sign up information is provided on this After Visit Summary.  MyChart is used to connect with patients for Virtual Visits (Telemedicine).  Patients are able to view lab/test results, encounter notes, upcoming appointments, etc.  Non-urgent messages can be sent to your provider as well.   To learn more about what you  can do with MyChart, go to [email or web address].    Your next appointment:   3 month(s)  Provider:   Little Ishikawa, MD

## 2023-02-08 NOTE — Progress Notes (Signed)
Cardiology Office Note:    Date:  02/08/2023   ID:  Larry Elliott, DOB 1946-06-02, MRN [id number]  PCP:  Wanda Plump, MD  Cardiologist:  Little Ishikawa, MD  Electrophysiologist:  None   Referring MD: Wanda Plump, MD   Chief Complaint  Patient presents with   Leg Swelling    History of Present Illness:    Larry Elliott is a 77 y.o. male with a hx of nonobstructive CAD, hypertension, hyperlipidemia, melanoma, prostate cancer who presents for follow-up.  He was referred by Dr. Drue Novel for preop evaluation prior to hip surgery, initially seen 12/19/2022.    Echocardiogram 04/2022 showed EF 50 to 55%, inferior basal hypokinesis, mild RV dysfunction, ascending aorta measuring 44 mm.  Cath in 2011 showed minimal CAD.    Since last clinic visit, he reports he has been doing okay.  Has been having swelling in his legs since his surgery.  Lower extremity duplex was negative for DVT.  He completed 1 month of Eliquis after his surgery.  Reports swelling has improved.  He is not sure what dose of Lasix he is taking, thinks he is only taking 20 mg once a day but previously was on 40 mg twice daily.  Denies any chest pain, dyspnea, lightheadedness, syncope, or palpitations.  Has lost 10 lbs in last 2 months  Wt Readings from Last 3 Encounters:  02/08/23 237 lb (107.5 kg)  01/18/23 249 lb 8 oz (113.2 kg)  01/13/23 247 lb (112 kg)       Past Medical History:  Diagnosis Date   Arthritis    BCC (basal cell carcinoma), leg    Right calf, L nape of neck at hairline   CAD (coronary artery disease)    CP, cath, non-obstructive   Diverticulosis    colon   Erectile dysfunction following radical prostatectomy    Hyperlipidemia    Hypertension    Insomnia    Melanoma 2010   face, sees derm    Prostate CA 07/2007   s/p robotic surgery   Spermatocele    Left    Past Surgical History:  Procedure Laterality Date   APPENDECTOMY     age 32   HERNIA REPAIR  2010   KNEE ARTHROSCOPY  Left 1965   meniscal tear   PROSTATECTOMY  07/25/2007    robotic,  for prostate cancer     SHOULDER ARTHROSCOPY Left 07/09/2013   Dr Ave Filter    TOTAL HIP ARTHROPLASTY Left 12/27/2022   Procedure: LEFT TOTAL HIP ARTHROPLASTY ANTERIOR APPROACH;  Surgeon: Marcene Corning, MD;  Location: [facility];  Service: Orthopedics;  Laterality: Left;   TRANSFORAMINAL LUMBAR INTERBODY FUSION (TLIF) WITH PEDICLE SCREW FIXATION 1 LEVEL Right 12/04/2019   Procedure: RIGHT-SIDED TRANSFORAMINAL LUMBAR INTERBODY FUSION LUMBAR FOUR THROUGH FIVE WITH INSTRUMENTATION AND ALLOGRAFT;  Surgeon: Estill Bamberg, MD;  Location: [facility];  Service: Orthopedics;  Laterality: Right;    Current Medications: Current Meds  Medication Sig   apixaban (ELIQUIS) 2.5 MG TABS tablet Take 1 tablet (2.5 mg total) by mouth 2 (two) times daily.   atorvastatin (LIPITOR) 40 MG tablet Take 1 tablet (40 mg total) by mouth at bedtime.   docusate sodium (COLACE) 100 MG capsule Take 1 capsule (100 mg total) by mouth 2 (two) times daily as needed for mild constipation.   fenofibrate micronized (LOFIBRA) 134 MG capsule Take 1 capsule (134 mg total) by mouth daily before breakfast.   furosemide (LASIX) 20 MG tablet Take 2 tablets (  40 mg total) by mouth daily for 7 days, THEN 1 tablet (20 mg total) daily.   furosemide (LASIX) 40 MG tablet Take 1 tablet (40 mg total) by mouth 2 (two) times daily.   melatonin 5 MG TABS Take 1 tablet (5 mg total) by mouth at bedtime.   oxyCODONE-acetaminophen (PERCOCET) 5-325 MG tablet Take 1 tablet by mouth every 6 (six) hours as needed for moderate pain or severe pain (post op pain).   potassium chloride SA (KLOR-CON M) 20 MEQ tablet Take 1 tablet (20 mEq total) by mouth 2 (two) times daily.   traZODone (DESYREL) 100 MG tablet Take 1 tablet (100 mg total) by mouth at bedtime.     Allergies:   Ambien [zolpidem], Aspirin, Hydrocodone, and Penicillins   Social History   Socioeconomic History   Marital status: Married     Spouse name: Not on file   Number of children: 2   Years of education: Not on file   Highest education level: Some college, no degree  Occupational History   Occupation: retired Emergency planning/management officer   Tobacco Use   Smoking status: Never   Smokeless tobacco: Never  Vaping Use   Vaping Use: Never used  Substance and Sexual Activity   Alcohol use: Yes    Alcohol/week: 7.0 standard drinks of alcohol    Types: 7 Glasses of wine per week    Comment: socially; 1 glass of wine/day   Drug use: Never   Sexual activity: Not on file  Other Topics Concern   Not on file  Social History Narrative   Lives w/ wife    Original from [place]   Social Determinants of Health   Financial Resource Strain: Low Risk  (01/16/2023)   Overall Financial Resource Strain (CARDIA)    Difficulty of Paying Living Expenses: Not hard at all  Food Insecurity: No Food Insecurity (01/16/2023)   Hunger Vital Sign    Worried About Running Out of Food in the Last Year: Never true    Ran Out of Food in the Last Year: Never true  Transportation Needs: No Transportation Needs (01/16/2023)   PRAPARE - Administrator, Civil Service (Medical): No    Lack of Transportation (Non-Medical): No  Physical Activity: Unknown (01/16/2023)   Exercise Vital Sign    Days of Exercise per Week: 0 days    Minutes of Exercise per Session: Not on file  Stress: Stress Concern Present (01/16/2023)   [employer] of Occupational Health - Occupational Stress Questionnaire    Feeling of Stress : Rather much  Social Connections: Moderately Isolated (01/16/2023)   Social Connection and Isolation Panel [NHANES]    Frequency of Communication with Friends and Family: Once a week    Frequency of Social Gatherings with Friends and Family: Once a week    Attends Religious Services: 1 to 4 times per year    Active Member of [employer] or Organizations: No    Attends Engineer, structural: Not on file    Marital Status: Married      Family History: The patient's family history includes Coronary artery disease (age of onset: 5) in his father; Stroke in his mother. There is no history of Hypertension, Diabetes, Colon cancer, or Prostate cancer.  ROS:   Please see the history of present illness.     All other systems reviewed and are negative.  EKGs/Labs/Other Studies Reviewed:    The following studies were reviewed today:   EKG:   12/19/2022: Normal  sinus rhythm, rate 101, Q waves in leads III, aVF  Recent Labs: 11/24/2022: B Natriuretic Peptide 19.7; TSH 2.256 12/28/2022: ALT 22 01/02/2023: Magnesium 2.2 01/09/2023: Hemoglobin 12.7; Platelets 619.0; Pro B Natriuretic peptide (BNP) 14.0 01/18/2023: BUN 18; Creatinine, Ser 0.79; Potassium 4.6; Sodium 140  Recent Lipid Panel    Component Value Date/Time   CHOL 204 (H) 01/25/2022 1524   TRIG 584.0 (H) 01/25/2022 1524   TRIG 276 (HH) 09/05/2006 0835   HDL 47.70 01/25/2022 1524   CHOLHDL 4 01/25/2022 1524   VLDL 70.8 (H) 03/18/2021 1431   LDLCALC 56 02/19/2013 1624   LDLDIRECT 82.0 01/25/2022 1524    Physical Exam:    VS:  BP (!) 142/88   Pulse 92   Ht  (1.778 m)   Wt 237 lb (107.5 kg)   SpO2 94%   BMI 34.01 kg/m     Wt Readings from Last 3 Encounters:  02/08/23 237 lb (107.5 kg)  01/18/23 249 lb 8 oz (113.2 kg)  01/13/23 247 lb (112 kg)     GEN:  Well nourished, well developed in no acute distress HEENT: Normal NECK: No JVD; No carotid bruits CARDIAC: RRR, no murmurs, rubs, gallops RESPIRATORY:  Clear to auscultation without rales, wheezing or rhonchi  ABDOMEN: Soft, non-tender, non-distended MUSCULOSKELETAL:  1+ BLE edema; No deformity  SKIN: Warm and dry NEUROLOGIC:  Alert and oriented x 3 PSYCHIATRIC:  Normal affect   ASSESSMENT:    1. Chronic diastolic heart failure   2. Bilateral leg edema   3. Medication management   4. Essential hypertension   5. Hyperlipidemia, unspecified hyperlipidemia type     PLAN:    Lower  extremity edema: Has been having swelling in legs since his hip surgery 12/2022.  Diastolic heart failure suspected.  Lower extremity duplex 01/18/2023 showed no evidence of DVT.  proBNP 14 on 3/18.  Reports edema is improving though he is unsure what dose of Lasix he is taking (previously prescribed 40 mg twice daily but thinks he is only on 20 mg daily). -Asked him to call and let us know what lasix dose he has been taking -Check CMET, magnesium -Update echocardiogram.  Has 1+ lower extremity edema on exam today but otherwise does not appear volume overloaded on exam, no JVD and lungs appear clear.  Recent BNP was unremarkable and weight is down 10 pounds from prior clinic visit 2 months ago.  Can continue maintenance dose of Lasix.  Encouraged to use compression stockings to help with lower extremity edema  Aortic dilatation: Ascending aorta measured 44 mm on echocardiogram 04/2022.  Recommend CTA chest to evaluate aorta  Nonobstructive CAD: Cath in 2011 showed minimal CAD.  Continue statin  Hyperlipidemia: On atorvastatin 40 mg daily.  LDL 82 on 01/25/22  Hypertension: Previously was on enalapril 5 mg daily, not currently listed on medication list but he thinks he is still taking.  Asked him to let us know if he is taking.  If not, would restart   RTC in 3 months.  Medication Adjustments/Labs and Tests Ordered: Current medicines are reviewed at length with the patient today.  Concerns regarding medicines are outlined above.  Orders Placed This Encounter  Procedures   Comprehensive metabolic panel   Magnesium   ECHOCARDIOGRAM COMPLETE   No orders of the defined types were placed in this encounter.   Patient Instructions  Medication Instructions:  Call or send mychart message when you get home to confirm your dose of Lasix and potassium  *  If you need a refill on your cardiac medications before your next appointment, please call your pharmacy*   Lab Work: CMET, Mag today  If you have  labs (blood work) drawn today and your tests are completely normal, you will receive your results only by: MyChart Message (if you have MyChart) OR A paper copy in the mail If you have any lab test that is abnormal or we need to change your treatment, we will call you to review the results.  Testing/Procedures: Your physician has requested that you have an echocardiogram. Echocardiography is a painless test that uses sound waves to create images of your heart. It provides your doctor with information about the size and shape of your heart and how well your heart's chambers and valves are working. This procedure takes approximately one hour. There are no restrictions for this procedure. Please do NOT wear cologne, perfume, aftershave, or lotions (deodorant is allowed). Please arrive 15 minutes prior to your appointment time.  Follow-Up: At [facility], you and your health needs are our priority.  As part of our continuing mission to provide you with exceptional heart care, we have created designated Provider Care Teams.  These Care Teams include your primary Cardiologist (physician) and Advanced Practice Providers (APPs -  Physician Assistants and Nurse Practitioners) who all work together to provide you with the care you need, when you need it.  We recommend signing up for the patient portal called "MyChart".  Sign up information is provided on this After Visit Summary.  MyChart is used to connect with patients for Virtual Visits (Telemedicine).  Patients are able to view lab/test results, encounter notes, upcoming appointments, etc.  Non-urgent messages can be sent to your provider as well.   To learn more about what you can do with MyChart, go to [email or web address].    Your next appointment:   3 month(s)  Provider:   Little Ishikawa, MD       Signed, Little Ishikawa, MD  02/08/2023 11:09 AM    [facility]

## 2023-02-09 LAB — COMPREHENSIVE METABOLIC PANEL
ALT: 27 IU/L (ref 0–44)
AST: 20 IU/L (ref 0–40)
Albumin/Globulin Ratio: 1.8 (ref 1.2–2.2)
Albumin: 4.2 g/dL (ref 3.8–4.8)
Alkaline Phosphatase: 105 IU/L (ref 44–121)
BUN/Creatinine Ratio: 20 (ref 10–24)
BUN: 15 mg/dL (ref 8–27)
Bilirubin Total: 0.3 mg/dL (ref 0.0–1.2)
CO2: 23 mmol/L (ref 20–29)
Calcium: 10.3 mg/dL — ABNORMAL HIGH (ref 8.6–10.2)
Chloride: 101 mmol/L (ref 96–106)
Creatinine, Ser: 0.75 mg/dL — ABNORMAL LOW (ref 0.76–1.27)
Globulin, Total: 2.4 g/dL (ref 1.5–4.5)
Glucose: 92 mg/dL (ref 70–99)
Potassium: 4.3 mmol/L (ref 3.5–5.2)
Sodium: 143 mmol/L (ref 134–144)
Total Protein: 6.6 g/dL (ref 6.0–8.5)
eGFR: 92 mL/min/{1.73_m2} (ref >59)

## 2023-02-09 LAB — MAGNESIUM: Magnesium: 2.3 mg/dL (ref 1.6–2.3)

## 2023-02-09 NOTE — Telephone Encounter (Signed)
No changes at this time.

## 2023-02-15 ENCOUNTER — Encounter: Payer: Self-pay | Admitting: Internal Medicine

## 2023-02-15 ENCOUNTER — Ambulatory Visit (INDEPENDENT_AMBULATORY_CARE_PROVIDER_SITE_OTHER): Payer: Medicare Other | Admitting: Internal Medicine

## 2023-02-15 ENCOUNTER — Ambulatory Visit (HOSPITAL_BASED_OUTPATIENT_CLINIC_OR_DEPARTMENT_OTHER)
Admission: RE | Admit: 2023-02-15 | Discharge: 2023-02-15 | Disposition: A | Discharge status: Home / Self Care | Payer: Medicare Other | Source: Ambulatory Visit | Attending: Internal Medicine | Admitting: Internal Medicine

## 2023-02-15 VITALS — BP 164/80 | HR 75 | Temp 97.8°F | Resp 18 | Ht 70.0 in | Wt 243.1 lb

## 2023-02-15 DIAGNOSIS — M545 Low back pain, unspecified: Secondary | ICD-10-CM

## 2023-02-15 DIAGNOSIS — M5136 Other intervertebral disc degeneration, lumbar region: Secondary | ICD-10-CM | POA: Diagnosis not present

## 2023-02-15 DIAGNOSIS — R600 Localized edema: Secondary | ICD-10-CM

## 2023-02-15 DIAGNOSIS — M4316 Spondylolisthesis, lumbar region: Secondary | ICD-10-CM | POA: Diagnosis not present

## 2023-02-15 NOTE — Patient Instructions (Signed)
For pain control: - Tylenol  500 mg OTC 2 tabs a day every 8 hours as needed  -Percocet at night - Heating pad - Call if not gradually better or if you get worse  Proceed with a x-ray downstairs  Let me know what medications you are taking  You have an appointment with me in May however you can reschedule for 2 months from today.

## 2023-02-15 NOTE — Progress Notes (Signed)
Subjective:    Patient ID: Larry Elliott, male    DOB: 10-19-1946, 77 y.o.   MRN: [id number]  DOS:  02/15/2023 Type of visit - description: Acute  3 days ago developed low back pain, more noticeable on the right. Tried  Tylenol with some relief. Denies any fall or injury. No rash No fever. No bladder or bowel incontinence. No abdominal pain.  Also, she has been dealing with swelling for a while, saw cardiology recently, note reviewed.  Review of Systems See above   Past Medical History:  Diagnosis Date   Arthritis    BCC (basal cell carcinoma), leg    Right calf, L nape of neck at hairline   CAD (coronary artery disease)    CP, cath, non-obstructive   Diverticulosis    colon   Erectile dysfunction following radical prostatectomy    Hyperlipidemia    Hypertension    Insomnia    Melanoma 2010   face, sees derm    Prostate CA 07/2007   s/p robotic surgery   Spermatocele    Left    Past Surgical History:  Procedure Laterality Date   APPENDECTOMY     age 47   HERNIA REPAIR  2010   KNEE ARTHROSCOPY Left 1965   meniscal tear   PROSTATECTOMY  07/25/2007    robotic,  for prostate cancer     SHOULDER ARTHROSCOPY Left 07/09/2013   Dr Ave Filter    TOTAL HIP ARTHROPLASTY Left 12/27/2022   Procedure: LEFT TOTAL HIP ARTHROPLASTY ANTERIOR APPROACH;  Surgeon: Marcene Corning, MD;  Location: [facility];  Service: Orthopedics;  Laterality: Left;   TRANSFORAMINAL LUMBAR INTERBODY FUSION (TLIF) WITH PEDICLE SCREW FIXATION 1 LEVEL Right 12/04/2019   Procedure: RIGHT-SIDED TRANSFORAMINAL LUMBAR INTERBODY FUSION LUMBAR FOUR THROUGH FIVE WITH INSTRUMENTATION AND ALLOGRAFT;  Surgeon: Estill Bamberg, MD;  Location: [facility];  Service: Orthopedics;  Laterality: Right;    Current Outpatient Medications  Medication Instructions   apixaban (ELIQUIS) 2.5 mg, Oral, 2 times daily   atorvastatin (LIPITOR) 40 mg, Oral, Daily at bedtime   docusate sodium (COLACE) 100 mg, Oral, 2 times daily PRN    fenofibrate micronized (LOFIBRA) 134 mg, Oral, Daily before breakfast   furosemide (LASIX) 40 mg, Oral, 2 times daily   melatonin 5 mg, Oral, Daily at bedtime   oxyCODONE-acetaminophen (PERCOCET) 5-325 MG tablet 1 tablet, Oral, Every 6 hours PRN   potassium chloride SA (KLOR-CON M) 20 MEQ tablet 20 mEq, Oral, 2 times daily   traZODone (DESYREL) 100 mg, Oral, Daily at bedtime       Objective:   Physical Exam BP (!) 164/80   Pulse 75   Temp 97.8 F (36.6 C) (Oral)   Resp 18   Ht  (1.778 m)   Wt 243 lb 2 oz (110.3 kg)   SpO2 95%   BMI 34.88 kg/m  General:   Well developed, NAD, BMI noted. HEENT:  Normocephalic . Face symmetric, atraumatic Low back: Mildly TTP throughout the lower back bilaterally, more noticeable on the right. Lower extremities: +/+++ Edema, from 3/4 of the pretibial area down.  No redness, no bruising, no openings. Skin: Not pale. Not jaundice Neurologic:  alert & oriented X3.  Speech normal, gait limited, uses a walker.  Motor seems symmetric but limited by pain.  I did not ask the patient to get on the examining table.   Psych--  Cognition and judgment appear intact.  Cooperative with normal attention span and concentration.  Behavior appropriate.  Assessment      Assessment  Hyperglycemia   HTN Hyperlipidemia Anxiety depression insomnia   dc clonazepam - not effective,  8-16, rx trazodone. Metabolic encephalopathy admitted July 2023: No more Ambien  CAD, non-obstructive Prostate cancer-- 2008, robotic surgery, no incontinence, Dr Laverle Patter: now f/u per PCP as of 05-2018 [facility] ED past surgery  spermatocele, left Skin --Melanoma 2010, BCCs  does not see derm regularly as off 01-2022 LE edema R>L (-) US DVT 2020 ABI 09/2019 WNL  PLAN Low back pain: As described above, history of back surgery before, no fall, no fever or chills, no red flags.  Neurological exam is limited. Plan: X-ray, encouraged Tylenol TID, OxyContin at nighttime as he  is doing.  Call if not gradually better. Lower extremity edema: See previous entries, saw cardiology 02/08/2023, LE edema suspected to be from diastolic heart failure.  Other than edema, no apparent volume overload. Was Rx to continue Lasix. Fortunately, today edema seems better. Status post left hip surgery: Took Eliquis for a month, it is supposed to be stopped.  I deleted from the medication list. Compliance: Patient is not completely sure of what he is taking, recommend to go home and send me a message regarding his med list RTC 2 months

## 2023-02-15 NOTE — Assessment & Plan Note (Signed)
Low back pain: As described above, history of back surgery before, no fall, no fever or chills, no red flags.  Neurological exam is limited. Plan: X-ray, encouraged Tylenol TID, OxyContin at nighttime as he is doing.  Call if not gradually better. Lower extremity edema: See previous entries, saw cardiology 02/08/2023, LE edema suspected to be from diastolic heart failure.  Other than edema, no apparent volume overload. Was Rx to continue Lasix. Fortunately, today edema seems better. Status post left hip surgery: Took Eliquis for a month, it is supposed to be stopped.  I deleted from the medication list. Compliance: Patient is not completely sure of what he is taking, recommend to go home and send me a message regarding his med list RTC 2 months

## 2023-02-16 ENCOUNTER — Ambulatory Visit: Payer: Medicare Other | Admitting: General Practice

## 2023-02-16 ENCOUNTER — Ambulatory Visit: Payer: Medicare Other | Admitting: Cardiology

## 2023-02-17 ENCOUNTER — Ambulatory Visit: Payer: Medicare Other | Admitting: Internal Medicine

## 2023-02-17 ENCOUNTER — Encounter: Payer: Self-pay | Admitting: Internal Medicine

## 2023-02-17 ENCOUNTER — Ambulatory Visit (INDEPENDENT_AMBULATORY_CARE_PROVIDER_SITE_OTHER): Payer: Medicare Other | Admitting: Medical

## 2023-02-17 VITALS — BP 138/78 | HR 82 | Temp 97.9°F | Resp 18 | Ht 70.0 in | Wt 232.0 lb

## 2023-02-17 DIAGNOSIS — R3911 Hesitancy of micturition: Secondary | ICD-10-CM | POA: Diagnosis not present

## 2023-02-17 DIAGNOSIS — R110 Nausea: Secondary | ICD-10-CM | POA: Diagnosis not present

## 2023-02-17 DIAGNOSIS — R102 Pelvic and perineal pain: Secondary | ICD-10-CM

## 2023-02-17 DIAGNOSIS — R1030 Lower abdominal pain, unspecified: Secondary | ICD-10-CM

## 2023-02-17 LAB — CBC WITH DIFFERENTIAL/PLATELET
Absolute Monocytes: 879 cells/uL (ref 200–950)
Basophils Absolute: 51 cells/uL (ref 0–200)
Eosinophils Relative: 1.2 %
Lymphs Abs: 1364 cells/uL (ref 850–3900)
MCH: 29.3 pg (ref 27.0–33.0)
MCHC: 33.1 g/dL (ref 32.0–36.0)
Neutrophils Relative %: 76.1 %
RBC: 4.85 10*6/uL (ref 4.20–5.80)
RDW: 12.9 % (ref 11.0–15.0)
Total Lymphocyte: 13.5 %

## 2023-02-17 LAB — POC URINALSYSI DIPSTICK (AUTOMATED)
Bilirubin, UA: NEGATIVE
Blood, UA: NEGATIVE
Glucose, UA: NEGATIVE
Ketones, UA: NEGATIVE
Leukocytes, UA: NEGATIVE
Nitrite, UA: NEGATIVE
Protein, UA: POSITIVE — AB
Spec Grav, UA: >=1.03 — AB (ref 1.010–1.025)
Urobilinogen, UA: 0.2 E.U./dL
pH, UA: 6 (ref 5.0–8.0)

## 2023-02-17 LAB — COMPREHENSIVE METABOLIC PANEL
AG Ratio: 1.6 (calc) (ref 1.0–2.5)
ALT: 21 U/L (ref 9–46)
Alkaline phosphatase (APISO): 86 U/L (ref 35–144)
BUN: 19 mg/dL (ref 7–25)
Calcium: 10.1 mg/dL (ref 8.6–10.3)
Globulin: 2.7 g/dL (calc) (ref 1.9–3.7)
Glucose, Bld: 86 mg/dL (ref 65–99)
Sodium: 141 mmol/L (ref 135–146)
Total Protein: 7.1 g/dL (ref 6.1–8.1)

## 2023-02-17 LAB — LIPASE: Lipase: 18 U/L (ref 7–60)

## 2023-02-17 MED ORDER — CIPROFLOXACIN HCL 500 MG PO TABS: 500.0000 mg | ORAL_TABLET | Freq: Two times a day (BID) | ORAL | 0 refills | Status: DC

## 2023-02-17 MED ORDER — ONDANSETRON HCL 4 MG PO TABS: 4.0000 mg | ORAL_TABLET | Freq: Three times a day (TID) | ORAL | 0 refills | Status: DC | PRN

## 2023-02-17 MED ORDER — METRONIDAZOLE 500 MG PO TABS: 500.0000 mg | ORAL_TABLET | Freq: Three times a day (TID) | ORAL | 0 refills | Status: AC

## 2023-02-17 NOTE — Patient Instructions (Addendum)
1. Urinary hesitancy You may have some degree of benign prostate enlargement but prostatitis in differntial as well. - PSA(note on review of pt chart and history later saw pt had robotic surgery in the past). After hours on weekend psa less than 0.04.)  2. Lower abdominal pain. Considering diverticulitis. Rx cipro and flagyl. Labs ordered as stat. - CBC w/Diff - Comp Met (CMET)  3. Suprapubic pressure Evaluating if urine infection present. - POCT Urinalysis Dipstick (Automated) - Urine Culture  4. Nausea. Will evaluate pancrease protein. - Lipase. If nausea or vomiting over weekend can use zofran.  Recommend bland healthy diet. If signs/symptoms worsen over weekend then be seen in ED   Follow up Tuesday with me or pcp.

## 2023-02-17 NOTE — Progress Notes (Signed)
Subjective:    Patient ID: Larry Elliott, male    DOB: 04/17/1946, 77 y.o.   MRN: [id number]  HPI  Pt in with 2 weeks of abdomen pain. He states hurts daily and moderate. Mild nausea at times. No vomting. Pt has some sweating and chills at times. No diarrhea. States regular bm. Feels like has to urinate but sometimes feels like can't urinates. Some hesitant urine flow.    On review 07-2014 ct showed.  IMPRESSION:  1. No acute findings to account for the patient's symptoms.  2. Extensive colonic diverticulosis, particularly in the sigmoid  colon, without surrounding inflammatory changes to suggest an acute  diverticulitis at this time.  3. 2 cm simple cyst in the upper pole of the left kidney, and  several small parapelvic cysts in the lower pole of the left kidney  are incidentally noted.   Pt last colonsocpy was in 2015. Diverticulosis.    Last bowel movement. No constipation.    Review of Systems  Constitutional:  Positive for chills, diaphoresis and fatigue. Negative for fever.  HENT:  Negative for dental problem.   Respiratory:  Negative for cough, chest tightness and wheezing.   Cardiovascular:  Negative for chest pain and palpitations.  Gastrointestinal:  Positive for abdominal pain and nausea. Negative for abdominal distention, blood in stool, constipation, diarrhea and vomiting.  Genitourinary:        Urinary hesitancy at times. But explains can urinate just has to wait some time for urine to flow on occasion. No urinary obstruction.  Musculoskeletal:  Negative for back pain and myalgias.  Skin:  Negative for rash.  Neurological:  Negative for facial asymmetry, speech difficulty, weakness and light-headedness.  Hematological:  Does not bruise/bleed easily.  Psychiatric/Behavioral:  Negative for confusion. The patient is not nervous/anxious.     Past Medical History:  Diagnosis Date   Arthritis    BCC (basal cell carcinoma), leg    Right calf, L nape of neck  at hairline   CAD (coronary artery disease)    CP, cath, non-obstructive   Diverticulosis    colon   Erectile dysfunction following radical prostatectomy    Hyperlipidemia    Hypertension    Insomnia    Melanoma (HCC) 2010   face, sees derm    Prostate CA (HCC) 07/2007   s/p robotic surgery   Spermatocele    Left     Social History   Socioeconomic History   Marital status: Married    Spouse name: Not on file   Number of children: 2   Years of education: Not on file   Highest education level: Some college, no degree  Occupational History   Occupation: retired Emergency planning/management officer   Tobacco Use   Smoking status: Never   Smokeless tobacco: Never  Vaping Use   Vaping Use: Never used  Substance and Sexual Activity   Alcohol use: Yes    Alcohol/week: 7.0 standard drinks of alcohol    Types: 7 Glasses of wine per week    Comment: socially; 1 glass of wine/day   Drug use: Never   Sexual activity: Not on file  Other Topics Concern   Not on file  Social History Narrative   Lives w/ wife    Original from [place]   Social Determinants of Health   Financial Resource Strain: Low Risk  (01/16/2023)   Overall Financial Resource Strain (CARDIA)    Difficulty of Paying Living Expenses: Not hard at all  Food Insecurity:  No Food Insecurity (01/16/2023)   Hunger Vital Sign    Worried About Running Out of Food in the Last Year: Never true    Ran Out of Food in the Last Year: Never true  Transportation Needs: No Transportation Needs (01/16/2023)   PRAPARE - Administrator, Civil Service (Medical): No    Lack of Transportation (Non-Medical): No  Physical Activity: Unknown (01/16/2023)   Exercise Vital Sign    Days of Exercise per Week: 0 days    Minutes of Exercise per Session: Not on file  Stress: Stress Concern Present (01/16/2023)   [employer] of Occupational Health - Occupational Stress Questionnaire    Feeling of Stress : Rather much  Social Connections:  Moderately Isolated (01/16/2023)   Social Connection and Isolation Panel [NHANES]    Frequency of Communication with Friends and Family: Once a week    Frequency of Social Gatherings with Friends and Family: Once a week    Attends Religious Services: 1 to 4 times per year    Active Member of [employer] or Organizations: No    Attends Banker Meetings: Not on file    Marital Status: Married  Intimate Partner Violence: Not At Risk (12/29/2022)   Humiliation, Afraid, Rape, and Kick questionnaire    Fear of Current or Ex-Partner: No    Emotionally Abused: No    Physically Abused: No    Sexually Abused: No    Past Surgical History:  Procedure Laterality Date   APPENDECTOMY     age 3   HERNIA REPAIR  2010   KNEE ARTHROSCOPY Left 1965   meniscal tear   PROSTATECTOMY  07/25/2007    robotic,  for prostate cancer     SHOULDER ARTHROSCOPY Left 07/09/2013   Dr Ave Filter    TOTAL HIP ARTHROPLASTY Left 12/27/2022   Procedure: LEFT TOTAL HIP ARTHROPLASTY ANTERIOR APPROACH;  Surgeon: Marcene Corning, MD;  Location: [facility];  Service: Orthopedics;  Laterality: Left;   TRANSFORAMINAL LUMBAR INTERBODY FUSION (TLIF) WITH PEDICLE SCREW FIXATION 1 LEVEL Right 12/04/2019   Procedure: RIGHT-SIDED TRANSFORAMINAL LUMBAR INTERBODY FUSION LUMBAR FOUR THROUGH FIVE WITH INSTRUMENTATION AND ALLOGRAFT;  Surgeon: Estill Bamberg, MD;  Location: [facility];  Service: Orthopedics;  Laterality: Right;    Family History  Problem Relation Age of Onset   Coronary artery disease Father 6       dx age 52s   Stroke Mother    Hypertension Neg Hx    Diabetes Neg Hx    Colon cancer Neg Hx    Prostate cancer Neg Hx     Allergies  Allergen Reactions   Ambien [Zolpidem]     Encephalopathy, admitted 04-2022, symptoms suspected to be from [place]   Aspirin Other (See Comments)    REACTION: swelling in face   Hydrocodone     Insomnia, mild SOB (w/o cough-wheezing)   Penicillins Other (See Comments)    REACTION:  swelling in face  Did it involve swelling of the face/tongue/throat, SOB, or low BP? Yes Did it involve sudden or severe rash/hives, skin peeling, or any reaction on the inside of your mouth or nose? No Did you need to seek medical attention at a hospital or doctor's office? Yes When did it last happen?      [id number] played Hockey If all above answers are "NO", may proceed with cephalosporin use.    Current Outpatient Medications on File Prior to Visit  Medication Sig Dispense Refill   atorvastatin (LIPITOR) 40 MG tablet  Take 1 tablet (40 mg total) by mouth at bedtime. 90 tablet 1   docusate sodium (COLACE) 100 MG capsule Take 1 capsule (100 mg total) by mouth 2 (two) times daily as needed for mild constipation. 10 capsule 0   fenofibrate micronized (LOFIBRA) 134 MG capsule Take 1 capsule (134 mg total) by mouth daily before breakfast. 90 capsule 1   furosemide (LASIX) 40 MG tablet Take 1 tablet (40 mg total) by mouth 2 (two) times daily. 60 tablet 0   melatonin 5 MG TABS Take 1 tablet (5 mg total) by mouth at bedtime. 30 tablet 0   oxyCODONE-acetaminophen (PERCOCET) 5-325 MG tablet Take 1 tablet by mouth every 6 (six) hours as needed for moderate pain or severe pain (post op pain). 20 tablet 0   potassium chloride SA (KLOR-CON M) 20 MEQ tablet Take 1 tablet (20 mEq total) by mouth 2 (two) times daily. 60 tablet 0   traZODone (DESYREL) 100 MG tablet Take 1 tablet (100 mg total) by mouth at bedtime. 30 tablet 5   No current facility-administered medications on file prior to visit.    BP 138/78   Pulse 82   Temp 97.9 F (36.6 C)   Resp 18   Ht 5\' 10"  (1.778 m)   Wt 232 lb (105.2 kg)   SpO2 96%   BMI 33.29 kg/m        Objective:   Physical Exam  General Mental Status- Alert. General Appearance- Not in acute distress.   Skin General: Color- Normal Color. Moisture- Normal Moisture.  Neck Carotid Arteries- Normal color. Moisture- Normal Moisture. No carotid bruits. No  JVD.  Chest and Lung Exam Auscultation: Breath Sounds:-Normal.  Cardiovascular Auscultation:Rythm- Regular. Murmurs & Other Heart Sounds:Auscultation of the heart reveals- No Murmurs.  Abdomen Inspection:-Inspeection Normal. Palpation/Percussion:Note:No mass. Palpation and Percussion of the abdomen reveal- mild to moderate mid suprabupic Tenderness to palpation., Non Distended + BS, no rebound or guarding.    Neurologic Cranial Nerve exam:- CN III-XII intact(No nystagmus), symmetric smile. Drift Test:- No drift. Romberg Exam:- Negative.  Heal to Toe Gait exam:-Normal. Finger to Nose:- Normal/Intact Strength:- 5/5 equal and symmetric strength both upper and lower extremities.   Back-no CVA tenderness    Assessment & Plan:  [phone]   Patient Instructions  1. Urinary hesitancy You may have some degree of benign prostate enlargement but prostatitis in differntial as well. - PSA  2. Lower abdominal pain. Considering diverticulitis. Rx cipro and flagyl. Labs ordered as stat. - CBC w/Diff - Comp Met (CMET)  3. Suprapubic pressure Evaluating if urine infection present. - POCT Urinalysis Dipstick (Automated) - Urine Culture  4. Nausea. Will evaluate pancrease protein. - Lipase. If nausea or vomiting over weekend can use zofran.  Recommend bland healthy diet. If signs/symptoms worsen over weekend then be seen in ED   Follow up Tuesday with me or pcp.     Esperanza Richters, PA-C

## 2023-02-18 LAB — COMPREHENSIVE METABOLIC PANEL
AST: 18 U/L (ref 10–35)
Albumin: 4.4 g/dL (ref 3.6–5.1)
CO2: 28 mmol/L (ref 20–32)
Chloride: 101 mmol/L (ref 98–110)
Creat: 1.15 mg/dL (ref 0.70–1.28)
Potassium: 4.4 mmol/L (ref 3.5–5.3)
Total Bilirubin: 0.4 mg/dL (ref 0.2–1.2)

## 2023-02-18 LAB — URINE CULTURE
MICRO NUMBER:: 14879723
SPECIMEN QUALITY:: ADEQUATE

## 2023-02-18 LAB — CBC WITH DIFFERENTIAL/PLATELET
Basophils Relative: 0.5 %
Eosinophils Absolute: 121 cells/uL (ref 15–500)
HCT: 42.9 % (ref 38.5–50.0)
Hemoglobin: 14.2 g/dL (ref 13.2–17.1)
MCV: 88.5 fL (ref 80.0–100.0)
MPV: 10.4 fL (ref 7.5–12.5)
Monocytes Relative: 8.7 %
Neutro Abs: 7686 cells/uL (ref 1500–7800)
Platelets: 522 10*3/uL — ABNORMAL HIGH (ref 140–400)
WBC: 10.1 10*3/uL (ref 3.8–10.8)

## 2023-02-18 LAB — PSA: PSA: <0.04 ng/mL (ref <=4.00)

## 2023-02-20 ENCOUNTER — Ambulatory Visit: Payer: Medicare Other | Admitting: Internal Medicine

## 2023-02-21 ENCOUNTER — Ambulatory Visit: Payer: Medicare Other | Admitting: Medical

## 2023-02-28 ENCOUNTER — Encounter: Payer: Self-pay | Admitting: Family Medicine

## 2023-02-28 ENCOUNTER — Ambulatory Visit (HOSPITAL_BASED_OUTPATIENT_CLINIC_OR_DEPARTMENT_OTHER)
Admission: RE | Admit: 2023-02-28 | Discharge: 2023-02-28 | Disposition: A | Discharge status: Home / Self Care | Payer: Medicare Other | Source: Ambulatory Visit | Attending: Family Medicine | Admitting: Family Medicine

## 2023-02-28 ENCOUNTER — Ambulatory Visit (INDEPENDENT_AMBULATORY_CARE_PROVIDER_SITE_OTHER): Payer: Medicare Other | Admitting: Family Medicine

## 2023-02-28 VITALS — BP 120/80 | HR 97 | Temp 98.6°F | Resp 20 | Ht 70.0 in | Wt 232.8 lb

## 2023-02-28 DIAGNOSIS — G609 Hereditary and idiopathic neuropathy, unspecified: Secondary | ICD-10-CM

## 2023-02-28 DIAGNOSIS — M25551 Pain in right hip: Secondary | ICD-10-CM

## 2023-02-28 DIAGNOSIS — F322 Major depressive disorder, single episode, severe without psychotic features: Secondary | ICD-10-CM | POA: Diagnosis not present

## 2023-02-28 LAB — COMPREHENSIVE METABOLIC PANEL
ALT: 26 U/L (ref 0–53)
AST: 20 U/L (ref 0–37)
Albumin: 4.1 g/dL (ref 3.5–5.2)
Alkaline Phosphatase: 82 U/L (ref 39–117)
BUN: 12 mg/dL (ref 6–23)
CO2: 29 mEq/L (ref 19–32)
Calcium: 10 mg/dL (ref 8.4–10.5)
Chloride: 103 mEq/L (ref 96–112)
Creatinine, Ser: 0.83 mg/dL (ref 0.40–1.50)
GFR: 83.36 mL/min (ref >60.00)
Glucose, Bld: 105 mg/dL — ABNORMAL HIGH (ref 70–99)
Potassium: 4.8 mEq/L (ref 3.5–5.1)
Sodium: 142 mEq/L (ref 135–145)
Total Bilirubin: 0.5 mg/dL (ref 0.2–1.2)
Total Protein: 6.6 g/dL (ref 6.0–8.3)

## 2023-02-28 LAB — CBC WITH DIFFERENTIAL/PLATELET
Basophils Absolute: 0.1 10*3/uL (ref 0.0–0.1)
Basophils Relative: 0.7 % (ref 0.0–3.0)
Eosinophils Absolute: 0.1 10*3/uL (ref 0.0–0.7)
Eosinophils Relative: 1.2 % (ref 0.0–5.0)
HCT: 42.8 % (ref 39.0–52.0)
Hemoglobin: 14 g/dL (ref 13.0–17.0)
Lymphocytes Relative: 10.3 % — ABNORMAL LOW (ref 12.0–46.0)
Lymphs Abs: 1 10*3/uL (ref 0.7–4.0)
MCHC: 32.6 g/dL (ref 30.0–36.0)
MCV: 89.3 fl (ref 78.0–100.0)
Monocytes Absolute: 1 10*3/uL (ref 0.1–1.0)
Monocytes Relative: 10.9 % (ref 3.0–12.0)
Neutro Abs: 7.2 10*3/uL (ref 1.4–7.7)
Neutrophils Relative %: 76.9 % (ref 43.0–77.0)
Platelets: 367 10*3/uL (ref 150.0–400.0)
RBC: 4.8 Mil/uL (ref 4.22–5.81)
RDW: 14.5 % (ref 11.5–15.5)
WBC: 9.3 10*3/uL (ref 4.0–10.5)

## 2023-02-28 LAB — TSH: TSH: 1.2 u[IU]/mL (ref 0.35–5.50)

## 2023-02-28 LAB — VITAMIN B12: Vitamin B-12: 259 pg/mL (ref 211–911)

## 2023-02-28 MED ORDER — GABAPENTIN 100 MG PO CAPS
100.0000 mg | ORAL_CAPSULE | Freq: Three times a day (TID) | ORAL | 3 refills | Status: DC
Start: 2023-02-28 — End: 2023-10-02

## 2023-02-28 MED ORDER — SERTRALINE HCL 50 MG PO TABS
50.0000 mg | ORAL_TABLET | Freq: Every day | ORAL | 3 refills | Status: DC
Start: 2023-02-28 — End: 2023-09-25

## 2023-02-28 NOTE — Progress Notes (Addendum)
Subjective:   By signing my name below, I, Shehryar Baig, attest that this documentation has been prepared under the direction and in the presence of Donato Schultz, DO. 02/28/2023   Patient ID: Larry Elliott, male    DOB: Feb 22, 1946, 77 y.o.   MRN: [id number]  Chief Complaint  Patient presents with   Abdominal Pain    Pt states started a few weeks ago. Some diarrhea and nausea.    Insomnia    Pt states Trazodone is not helping and states barley getting an hour of sleep    Abdominal Pain Pertinent negatives include no diarrhea (associated with abdminal pain), fever, headaches, nausea (associated with abdominal pain) or vomiting.  Insomnia Primary symptoms: no malaise/fatigue.   PMH includes: depression.    Patient is in today for a office visit.   He complains of abdominal pain for the past couple of weeks. He has some nausea and diarrhea.  He complains of difficulty sleeping for the past month. He has not had continuous sleep past one hour. He typically sleeps on his right hip due to his past left hip replacement procedure. His right hip pain causes him pain which wakes up. He tried taking trazodone to help sleep but found it was not effective. He also feels restless at night and finds walking around does not help him sleep. He has received an X-ray in the past for it. He has moderate degenerative disk and possible slipping disk. He also reports hurting his back a couple months ago but notes his pain from then is mostly resolved. He does not sleep during the day due to having pain in his right hip.  He also complains of numbness and swelling in both feet. His numbness is gradually worsening. He feels his legs will give out on his while walking. He finds his foot occasionally slide of the gas and break peddle while driving when his symptoms worsen. He has a history of spinal fusion procedure. His numbness worsens while sitting on the side of his bed.  He is depressed due to his  pain and lack of sleep. He has not taken any medication for depression in the past. He is interested in taking medication to help manage his depression. He is not sure if he has a history of vitamin B12 deficiency.    Past Medical History:  Diagnosis Date   Arthritis    BCC (basal cell carcinoma), leg    Right calf, L nape of neck at hairline   CAD (coronary artery disease)    CP, cath, non-obstructive   Diverticulosis    colon   Erectile dysfunction following radical prostatectomy    Hyperlipidemia    Hypertension    Insomnia    Melanoma (HCC) 2010   face, sees derm    Prostate CA (HCC) 07/2007   s/p robotic surgery   Spermatocele    Left    Past Surgical History:  Procedure Laterality Date   APPENDECTOMY     age 25   HERNIA REPAIR  2010   KNEE ARTHROSCOPY Left 1965   meniscal tear   PROSTATECTOMY  07/25/2007    robotic,  for prostate cancer     SHOULDER ARTHROSCOPY Left 07/09/2013   Dr Ave Filter    TOTAL HIP ARTHROPLASTY Left 12/27/2022   Procedure: LEFT TOTAL HIP ARTHROPLASTY ANTERIOR APPROACH;  Surgeon: Marcene Corning, MD;  Location: [facility];  Service: Orthopedics;  Laterality: Left;   TRANSFORAMINAL LUMBAR INTERBODY FUSION (TLIF) WITH PEDICLE SCREW  FIXATION 1 LEVEL Right 12/04/2019   Procedure: RIGHT-SIDED TRANSFORAMINAL LUMBAR INTERBODY FUSION LUMBAR FOUR THROUGH FIVE WITH INSTRUMENTATION AND ALLOGRAFT;  Surgeon: Estill Bamberg, MD;  Location: [facility];  Service: Orthopedics;  Laterality: Right;    Family History  Problem Relation Age of Onset   Coronary artery disease Father 51       dx age 86s   Stroke Mother    Hypertension Neg Hx    Diabetes Neg Hx    Colon cancer Neg Hx    Prostate cancer Neg Hx     Social History   Socioeconomic History   Marital status: Married    Spouse name: Not on file   Number of children: 2   Years of education: Not on file   Highest education level: Some college, no degree  Occupational History   Occupation: retired Merchant navy officer   Tobacco Use   Smoking status: Never   Smokeless tobacco: Never  Vaping Use   Vaping Use: Never used  Substance and Sexual Activity   Alcohol use: Yes    Alcohol/week: 7.0 standard drinks of alcohol    Types: 7 Glasses of wine per week    Comment: socially; 1 glass of wine/day   Drug use: Never   Sexual activity: Not on file  Other Topics Concern   Not on file  Social History Narrative   Lives w/ wife    Original from [place]   Social Determinants of Health   Financial Resource Strain: Low Risk  (01/16/2023)   Overall Financial Resource Strain (CARDIA)    Difficulty of Paying Living Expenses: Not hard at all  Food Insecurity: No Food Insecurity (01/16/2023)   Hunger Vital Sign    Worried About Running Out of Food in the Last Year: Never true    Ran Out of Food in the Last Year: Never true  Transportation Needs: No Transportation Needs (01/16/2023)   PRAPARE - Administrator, Civil Service (Medical): No    Lack of Transportation (Non-Medical): No  Physical Activity: Unknown (01/16/2023)   Exercise Vital Sign    Days of Exercise per Week: 0 days    Minutes of Exercise per Session: Not on file  Stress: Stress Concern Present (01/16/2023)   [employer] of Occupational Health - Occupational Stress Questionnaire    Feeling of Stress : Rather much  Social Connections: Moderately Isolated (01/16/2023)   Social Connection and Isolation Panel [NHANES]    Frequency of Communication with Friends and Family: Once a week    Frequency of Social Gatherings with Friends and Family: Once a week    Attends Religious Services: 1 to 4 times per year    Active Member of [employer] or Organizations: No    Attends Engineer, structural: Not on file    Marital Status: Married  Catering manager Violence: Not At Risk (12/29/2022)   Humiliation, Afraid, Rape, and Kick questionnaire    Fear of Current or Ex-Partner: No    Emotionally Abused: No    Physically Abused: No     Sexually Abused: No    Outpatient Medications Prior to Visit  Medication Sig Dispense Refill   atorvastatin (LIPITOR) 40 MG tablet Take 1 tablet (40 mg total) by mouth at bedtime. 90 tablet 1   ciprofloxacin (CIPRO) 500 MG tablet Take 1 tablet (500 mg total) by mouth 2 (two) times daily. 14 tablet 0   docusate sodium (COLACE) 100 MG capsule Take 1 capsule (100 mg total) by mouth  2 (two) times daily as needed for mild constipation. 10 capsule 0   fenofibrate micronized (LOFIBRA) 134 MG capsule Take 1 capsule (134 mg total) by mouth daily before breakfast. 90 capsule 1   furosemide (LASIX) 40 MG tablet Take 1 tablet (40 mg total) by mouth 2 (two) times daily. 60 tablet 0   melatonin 5 MG TABS Take 1 tablet (5 mg total) by mouth at bedtime. 30 tablet 0   ondansetron (ZOFRAN) 4 MG tablet Take 1 tablet (4 mg total) by mouth every 8 (eight) hours as needed for nausea or vomiting. 20 tablet 0   oxyCODONE-acetaminophen (PERCOCET) 5-325 MG tablet Take 1 tablet by mouth every 6 (six) hours as needed for moderate pain or severe pain (post op pain). 20 tablet 0   potassium chloride SA (KLOR-CON M) 20 MEQ tablet Take 1 tablet (20 mEq total) by mouth 2 (two) times daily. 60 tablet 0   traZODone (DESYREL) 100 MG tablet Take 1 tablet (100 mg total) by mouth at bedtime. 30 tablet 5   No facility-administered medications prior to visit.    Allergies  Allergen Reactions   Ambien [Zolpidem]     Encephalopathy, admitted 04-2022, symptoms suspected to be from [place]   Aspirin Other (See Comments)    REACTION: swelling in face   Hydrocodone     Insomnia, mild SOB (w/o cough-wheezing)   Penicillins Other (See Comments)    REACTION: swelling in face  Did it involve swelling of the face/tongue/throat, SOB, or low BP? Yes Did it involve sudden or severe rash/hives, skin peeling, or any reaction on the inside of your mouth or nose? No Did you need to seek medical attention at a hospital or doctor's office?  Yes When did it last happen?      [id number] played Hockey If all above answers are "NO", may proceed with cephalosporin use.    Review of Systems  Constitutional:  Negative for fever and malaise/fatigue.  HENT:  Negative for congestion.   Eyes:  Negative for blurred vision.  Respiratory:  Negative for cough and shortness of breath.   Cardiovascular:  Negative for chest pain, palpitations and leg swelling.  Gastrointestinal:  Negative for abdominal pain, diarrhea (associated with abdminal pain), nausea (associated with abdominal pain) and vomiting.  Musculoskeletal:  Positive for back pain, falls and joint pain.       (+)swelling in both feet (+)right hip pain  Skin:  Negative for rash.  Neurological:  Positive for tingling (bilateral feet). Negative for loss of consciousness and headaches.  Psychiatric/Behavioral:  Positive for depression. Negative for hallucinations, substance abuse and suicidal ideas. The patient has insomnia.        Objective:    Physical Exam Vitals and nursing note reviewed.  Constitutional:      General: He is not in acute distress.    Appearance: Normal appearance. He is not ill-appearing.  HENT:     Head: Normocephalic and atraumatic.     Right Ear: External ear normal.     Left Ear: External ear normal.  Eyes:     Extraocular Movements: Extraocular movements intact.     Pupils: Pupils are equal, round, and reactive to light.  Cardiovascular:     Rate and Rhythm: Normal rate and regular rhythm.     Heart sounds: Normal heart sounds. No murmur heard.    No gallop.  Pulmonary:     Effort: Pulmonary effort is normal. No respiratory distress.     Breath sounds: Normal breath  sounds. No wheezing or rales.  Abdominal:     General: There is no abdominal bruit.     Tenderness: There is no abdominal tenderness.  Musculoskeletal:     Right hip: Tenderness and bony tenderness present. Decreased range of motion.     Left hip: Tenderness present.     Right  upper leg: Tenderness present.     Left upper leg: Tenderness present.     Comments: 3/5 strength left hip flexure 4/5 strength right hip flexure Otherwise 5/5 strength in both lower extremities  Skin:    General: Skin is warm and dry.  Neurological:     General: No focal deficit present.     Mental Status: He is alert and oriented to person, place, and time.  Psychiatric:        Judgment: Judgment normal.     BP 120/80 (BP Location: Left Arm, Patient Position: Sitting, Cuff Size: Large)   Pulse 97   Temp 98.6 F (37 C) (Oral)   Resp 20   Ht 5\' 10"  (1.778 m)   Wt 232 lb 12.8 oz (105.6 kg)   SpO2 96%   BMI 33.40 kg/m  Wt Readings from Last 3 Encounters:  02/28/23 232 lb 12.8 oz (105.6 kg)  02/17/23 232 lb (105.2 kg)  02/15/23 243 lb 2 oz (110.3 kg)       Assessment & Plan:  Right hip pain -     DG HIP UNILAT W OR W/O PELVIS 2-3 VIEWS RIGHT; Future -     Ambulatory referral to Orthopedic Surgery  Idiopathic peripheral neuropathy -     Gabapentin; Take 1 capsule (100 mg total) by mouth 3 (three) times daily.  Dispense: 90 capsule; Refill: 3 -     CBC with Differential/Platelet -     Comprehensive metabolic panel -     TSH -     Vitamin B12 -     MR LUMBAR SPINE W WO CONTRAST; Future  Depression, major, single episode, severe (HCC) -     Sertraline HCl; Take 1 tablet (50 mg total) by mouth daily.  Dispense: 30 tablet; Refill: 3 -     CBC with Differential/Platelet -     Comprehensive metabolic panel -     TSH -     Vitamin B12    I, Donato Schultz, DO, personally preformed the services described in this documentation.  All medical record entries made by the scribe were at my direction and in my presence.  I have reviewed the chart and discharge instructions (if applicable) and agree that the record reflects my personal performance and is accurate and complete. 02/28/2023   I,Shehryar Baig,acting as a scribe for Donato Schultz, DO.,have documented all  relevant documentation on the behalf of Donato Schultz, DO,as directed by  Donato Schultz, DO while in the presence of Donato Schultz, DO.   Donato Schultz, DO

## 2023-03-01 ENCOUNTER — Ambulatory Visit: Payer: Medicare Other | Admitting: Internal Medicine

## 2023-03-03 ENCOUNTER — Ambulatory Visit: Payer: Medicare Other | Admitting: General Practice

## 2023-03-05 ENCOUNTER — Encounter (HOSPITAL_BASED_OUTPATIENT_CLINIC_OR_DEPARTMENT_OTHER): Payer: Self-pay

## 2023-03-05 ENCOUNTER — Ambulatory Visit (HOSPITAL_BASED_OUTPATIENT_CLINIC_OR_DEPARTMENT_OTHER): Payer: Medicare Other

## 2023-03-09 ENCOUNTER — Other Ambulatory Visit: Payer: Self-pay | Admitting: Medical

## 2023-03-10 ENCOUNTER — Ambulatory Visit (HOSPITAL_COMMUNITY): Payer: Medicare Other | Attending: Cardiology

## 2023-03-10 DIAGNOSIS — I5032 Chronic diastolic (congestive) heart failure: Secondary | ICD-10-CM

## 2023-03-10 LAB — ECHOCARDIOGRAM COMPLETE
Area-P 1/2: 4.71 cm2
S' Lateral: 3.4 cm

## 2023-03-31 DIAGNOSIS — M25552 Pain in left hip: Secondary | ICD-10-CM | POA: Diagnosis not present

## 2023-04-03 DIAGNOSIS — M48062 Spinal stenosis, lumbar region with neurogenic claudication: Secondary | ICD-10-CM | POA: Diagnosis not present

## 2023-04-04 ENCOUNTER — Ambulatory Visit: Payer: Medicare Other | Admitting: Internal Medicine

## 2023-04-18 ENCOUNTER — Ambulatory Visit (INDEPENDENT_AMBULATORY_CARE_PROVIDER_SITE_OTHER): Payer: Medicare Other | Admitting: *Deleted

## 2023-04-18 VITALS — BP 128/73 | HR 81 | Ht 70.0 in | Wt 252.4 lb

## 2023-04-18 DIAGNOSIS — Z0000 Encounter for general adult medical examination without abnormal findings: Secondary | ICD-10-CM | POA: Diagnosis not present

## 2023-04-18 NOTE — Patient Instructions (Signed)
Larry Elliott , Thank you for taking time to come for your Medicare Wellness Visit. I appreciate your ongoing commitment to your health goals. Please review the following plan we discussed and let me know if I can assist you in the future.   These are the goals we discussed:  Goals   None     This is a list of the screening recommended for you and due dates:  Health Maintenance  Topic Date Due   COVID-19 Vaccine (6 - 2023-24 season) 06/24/2022   Flu Shot  05/25/2023   Medicare Annual Wellness Visit  04/17/2024   DTaP/Tdap/Td vaccine (3 - Td or Tdap) 05/12/2029   Pneumonia Vaccine  Completed   Hepatitis C Screening  Completed   Zoster (Shingles) Vaccine  Completed   HPV Vaccine  Aged Out   Colon Cancer Screening  Discontinued     Next appointment: Follow up in one year for your annual wellness visit.   Preventive Care 79 Years and Older, Male Preventive care refers to lifestyle choices and visits with your health care provider that can promote health and wellness. What does preventive care include? A yearly physical exam. This is also called an annual well check. Dental exams once or twice a year. Routine eye exams. Ask your health care provider how often you should have your eyes checked. Personal lifestyle choices, including: Daily care of your teeth and gums. Regular physical activity. Eating a healthy diet. Avoiding tobacco and drug use. Limiting alcohol use. Practicing safe sex. Taking low doses of aspirin every day. Taking vitamin and mineral supplements as recommended by your health care provider. What happens during an annual well check? The services and screenings done by your health care provider during your annual well check will depend on your age, overall health, lifestyle risk factors, and family history of disease. Counseling  Your health care provider may ask you questions about your: Alcohol use. Tobacco use. Drug use. Emotional well-being. Home and  relationship well-being. Sexual activity. Eating habits. History of falls. Memory and ability to understand (cognition). Work and work Astronomer. Screening  You may have the following tests or measurements: Height, weight, and BMI. Blood pressure. Lipid and cholesterol levels. These may be checked every 5 years, or more frequently if you are over 4 years old. Skin check. Lung cancer screening. You may have this screening every year starting at age 79 if you have a 30-pack-year history of smoking and currently smoke or have quit within the past 15 years. Fecal occult blood test (FOBT) of the stool. You may have this test every year starting at age 79. Flexible sigmoidoscopy or colonoscopy. You may have a sigmoidoscopy every 5 years or a colonoscopy every 10 years starting at age 79. Prostate cancer screening. Recommendations will vary depending on your family history and other risks. Hepatitis C blood test. Hepatitis B blood test. Sexually transmitted disease (STD) testing. Diabetes screening. This is done by checking your blood sugar (glucose) after you have not eaten for a while (fasting). You may have this done every 1-3 years. Abdominal aortic aneurysm (AAA) screening. You may need this if you are a current or former smoker. Osteoporosis. You may be screened starting at age 79 if you are at high risk. Talk with your health care provider about your test results, treatment options, and if necessary, the need for more tests. Vaccines  Your health care provider may recommend certain vaccines, such as: Influenza vaccine. This is recommended every year. Tetanus, diphtheria, and  acellular pertussis (Tdap, Td) vaccine. You may need a Td booster every 10 years. Zoster vaccine. You may need this after age 79. Pneumococcal 13-valent conjugate (PCV13) vaccine. One dose is recommended after age 79. Pneumococcal polysaccharide (PPSV23) vaccine. One dose is recommended after age 79. Talk to your  health care provider about which screenings and vaccines you need and how often you need them. This information is not intended to replace advice given to you by your health care provider. Make sure you discuss any questions you have with your health care provider. Document Released: 11/06/2015 Document Revised: 06/29/2016 Document Reviewed: 08/11/2015 Elsevier Interactive Patient Education  2017 [place] Prevention in the Home Falls can cause injuries. They can happen to people of all ages. There are many things you can do to make your home safe and to help prevent falls. What can I do on the outside of my home? Regularly fix the edges of walkways and driveways and fix any cracks. Remove anything that might make you trip as you walk through a door, such as a raised step or threshold. Trim any bushes or trees on the path to your home. Use bright outdoor lighting. Clear any walking paths of anything that might make someone trip, such as rocks or tools. Regularly check to see if handrails are loose or broken. Make sure that both sides of any steps have handrails. Any raised decks and porches should have guardrails on the edges. Have any leaves, snow, or ice cleared regularly. Use sand or salt on walking paths during winter. Clean up any spills in your garage right away. This includes oil or grease spills. What can I do in the bathroom? Use night lights. Install grab bars by the toilet and in the tub and shower. Do not use towel bars as grab bars. Use non-skid mats or decals in the tub or shower. If you need to sit down in the shower, use a plastic, non-slip stool. Keep the floor dry. Clean up any water that spills on the floor as soon as it happens. Remove soap buildup in the tub or shower regularly. Attach bath mats securely with double-sided non-slip rug tape. Do not have throw rugs and other things on the floor that can make you trip. What can I do in the bedroom? Use night  lights. Make sure that you have a light by your bed that is easy to reach. Do not use any sheets or blankets that are too big for your bed. They should not hang down onto the floor. Have a firm chair that has side arms. You can use this for support while you get dressed. Do not have throw rugs and other things on the floor that can make you trip. What can I do in the kitchen? Clean up any spills right away. Avoid walking on wet floors. Keep items that you use a lot in easy-to-reach places. If you need to reach something above you, use a strong step stool that has a grab bar. Keep electrical cords out of the way. Do not use floor polish or wax that makes floors slippery. If you must use wax, use non-skid floor wax. Do not have throw rugs and other things on the floor that can make you trip. What can I do with my stairs? Do not leave any items on the stairs. Make sure that there are handrails on both sides of the stairs and use them. Fix handrails that are broken or loose. Make sure that  handrails are as long as the stairways. Check any carpeting to make sure that it is firmly attached to the stairs. Fix any carpet that is loose or worn. Avoid having throw rugs at the top or bottom of the stairs. If you do have throw rugs, attach them to the floor with carpet tape. Make sure that you have a light switch at the top of the stairs and the bottom of the stairs. If you do not have them, ask someone to add them for you. What else can I do to help prevent falls? Wear shoes that: Do not have high heels. Have rubber bottoms. Are comfortable and fit you well. Are closed at the toe. Do not wear sandals. If you use a stepladder: Make sure that it is fully opened. Do not climb a closed stepladder. Make sure that both sides of the stepladder are locked into place. Ask someone to hold it for you, if possible. Clearly mark and make sure that you can see: Any grab bars or handrails. First and last  steps. Where the edge of each step is. Use tools that help you move around (mobility aids) if they are needed. These include: Canes. Walkers. Scooters. Crutches. Turn on the lights when you go into a dark area. Replace any light bulbs as soon as they burn out. Set up your furniture so you have a clear path. Avoid moving your furniture around. If any of your floors are uneven, fix them. If there are any pets around you, be aware of where they are. Review your medicines with your doctor. Some medicines can make you feel dizzy. This can increase your chance of falling. Ask your doctor what other things that you can do to help prevent falls. This information is not intended to replace advice given to you by your health care provider. Make sure you discuss any questions you have with your health care provider. Document Released: 08/06/2009 Document Revised: 03/17/2016 Document Reviewed: 11/14/2014 Elsevier Interactive Patient Education  2017 [employer].

## 2023-04-18 NOTE — Progress Notes (Signed)
Subjective:   Larry Elliott is a 77 y.o. male who presents for Medicare Annual/Subsequent preventive examination.  Visit Complete: In person  Patient Medicare AWV questionnaire was completed by the patient on 04/17/23; I have confirmed that all information answered by patient is correct and no changes since this date.   Review of Systems     Cardiac Risk Factors include: advanced age (>30men, >23 women);male gender;dyslipidemia;hypertension;obesity (BMI >30kg/m2);sedentary lifestyle     Objective:    Today's Vitals   04/18/23 0936  BP: 128/73  Pulse: 81  SpO2: 96%  Weight: 252 lb 6.4 oz (114.5 kg)  Height: 5\' 10"  (1.778 m)   Body mass index is 36.22 kg/m.     04/18/2023    9:30 AM 12/29/2022    5:36 AM 12/27/2022    7:59 AM 12/26/2022    9:04 AM 11/24/2022   10:47 AM 12/05/2019    8:00 AM 11/27/2019   10:51 AM  Advanced Directives  Does Patient Have a Medical Advance Directive? Yes Yes Yes No Yes No No  Type of Estate agent of [place];Living will Living will;Healthcare Power of [employer] Power of [place];Living will  Healthcare Power of [place];Living will    Does patient want to make changes to medical advance directive? No - Patient declined No - Patient declined No - Patient declined  No - Patient declined    Copy of Healthcare Power of Attorney in Chart? Yes - validated most recent copy scanned in chart (See row information)  Yes - validated most recent copy scanned in chart (See row information)      Would patient like information on creating a medical advance directive?   No - Patient declined No - Patient declined  No - Patient declined Yes (MAU/Ambulatory/Procedural Areas - Information given)    Current Medications (verified) Outpatient Encounter Medications as of 04/18/2023  Medication Sig   ondansetron (ZOFRAN) 4 MG tablet TAKE 1 TABLET BY MOUTH EVERY 8 HOURS AS NEEDED FOR NAUSEA OR VOMITING   atorvastatin (LIPITOR) 40 MG tablet  Take 1 tablet (40 mg total) by mouth at bedtime.   docusate sodium (COLACE) 100 MG capsule Take 1 capsule (100 mg total) by mouth 2 (two) times daily as needed for mild constipation.   fenofibrate micronized (LOFIBRA) 134 MG capsule Take 1 capsule (134 mg total) by mouth daily before breakfast.   furosemide (LASIX) 40 MG tablet Take 1 tablet (40 mg total) by mouth 2 (two) times daily.   gabapentin (NEURONTIN) 100 MG capsule Take 1 capsule (100 mg total) by mouth 3 (three) times daily.   melatonin 5 MG TABS Take 1 tablet (5 mg total) by mouth at bedtime.   oxyCODONE-acetaminophen (PERCOCET) 5-325 MG tablet Take 1 tablet by mouth every 6 (six) hours as needed for moderate pain or severe pain (post op pain).   potassium chloride SA (KLOR-CON M) 20 MEQ tablet Take 1 tablet (20 mEq total) by mouth 2 (two) times daily.   sertraline (ZOLOFT) 50 MG tablet Take 1 tablet (50 mg total) by mouth daily.   [DISCONTINUED] ciprofloxacin (CIPRO) 500 MG tablet Take 1 tablet (500 mg total) by mouth 2 (two) times daily.   No facility-administered encounter medications on file as of 04/18/2023.    Allergies (verified) Ambien [zolpidem], Aspirin, Hydrocodone, and Penicillins   History: Past Medical History:  Diagnosis Date   Allergy    penicillin & Aspirin   Anxiety 11/04/2013   Arthritis    BCC (basal cell  carcinoma), leg    Right calf, L nape of neck at hairline   CAD (coronary artery disease)    CP, cath, non-obstructive   Cataract    been removed   Depression 11/04/2013   Diverticulosis    colon   Erectile dysfunction following radical prostatectomy    Hyperlipidemia    Hypertension    Insomnia    Melanoma (HCC) 2010   face, sees derm    Prostate CA (HCC) 07/2007   s/p robotic surgery   Spermatocele    Left   Past Surgical History:  Procedure Laterality Date   APPENDECTOMY     age 9   EYE SURGERY     cataracts removed   HERNIA REPAIR  2010   JOINT REPLACEMENT  hip 2024   KNEE  ARTHROSCOPY Left 1965   meniscal tear   PROSTATECTOMY  07/25/2007    robotic,  for prostate cancer     SHOULDER ARTHROSCOPY Left 07/09/2013   Dr Ave Filter    SPINE SURGERY     TOTAL HIP ARTHROPLASTY Left 12/27/2022   Procedure: LEFT TOTAL HIP ARTHROPLASTY ANTERIOR APPROACH;  Surgeon: Marcene Corning, MD;  Location: [facility];  Service: Orthopedics;  Laterality: Left;   TRANSFORAMINAL LUMBAR INTERBODY FUSION (TLIF) WITH PEDICLE SCREW FIXATION 1 LEVEL Right 12/04/2019   Procedure: RIGHT-SIDED TRANSFORAMINAL LUMBAR INTERBODY FUSION LUMBAR FOUR THROUGH FIVE WITH INSTRUMENTATION AND ALLOGRAFT;  Surgeon: Estill Bamberg, MD;  Location: [facility];  Service: Orthopedics;  Laterality: Right;   Family History  Problem Relation Age of Onset   Coronary artery disease Father 66       dx age 57s   Stroke Mother    Hypertension Neg Hx    Diabetes Neg Hx    Colon cancer Neg Hx    Prostate cancer Neg Hx    Social History   Socioeconomic History   Marital status: Married    Spouse name: Not on file   Number of children: 2   Years of education: Not on file   Highest education level: Some college, no degree  Occupational History   Occupation: retired Emergency planning/management officer   Tobacco Use   Smoking status: Never   Smokeless tobacco: Never  Building services engineer Use: Never used  Substance and Sexual Activity   Alcohol use: Not Currently    Alcohol/week: 1.0 standard drink of alcohol    Types: 1 Glasses of wine per week    Comment: fruit glass size   Drug use: Never   Sexual activity: Not on file  Other Topics Concern   Not on file  Social History Narrative   Lives w/ wife    Original from [place]   Social Determinants of Health   Financial Resource Strain: Low Risk  (04/17/2023)   Overall Financial Resource Strain (CARDIA)    Difficulty of Paying Living Expenses: Not hard at all  Food Insecurity: No Food Insecurity (04/17/2023)   Hunger Vital Sign    Worried About Running Out of Food in the Last Year:  Never true    Ran Out of Food in the Last Year: Never true  Transportation Needs: No Transportation Needs (04/17/2023)   PRAPARE - Administrator, Civil Service (Medical): No    Lack of Transportation (Non-Medical): No  Physical Activity: Inactive (04/17/2023)   Exercise Vital Sign    Days of Exercise per Week: 0 days    Minutes of Exercise per Session: 0 min  Stress: No Stress Concern Present (04/17/2023)  [employer] of Occupational Health - Occupational Stress Questionnaire    Feeling of Stress : Only a little  Social Connections: Moderately Isolated (04/17/2023)   Social Connection and Isolation Panel [NHANES]    Frequency of Communication with Friends and Family: Once a week    Frequency of Social Gatherings with Friends and Family: Once a week    Attends Religious Services: 1 to 4 times per year    Active Member of [employer] or Organizations: No    Attends Engineer, structural: Never    Marital Status: Married    Tobacco Counseling Counseling given: Not Answered   Clinical Intake:  Pre-visit preparation completed: Yes  Pain : No/denies pain  BMI - recorded: 36.22 Nutritional Status: BMI > 30  Obese Nutritional Risks: None Diabetes: No  How often do you need to have someone help you when you read instructions, pamphlets, or other written materials from your doctor or pharmacy?: 1 - Never  Interpreter Needed?: No  Information entered by :: [employer], CMA   Activities of Daily Living    04/17/2023   10:54 AM 12/29/2022    5:36 AM  In your present state of health, do you have any difficulty performing the following activities:  Hearing? 1 1  Comment wears hearing aids   Vision? 0 0  Difficulty concentrating or making decisions? 0 0  Walking or climbing stairs? 0 1  Dressing or bathing? 0 0  Doing errands, shopping? 0 0  Preparing Food and eating ? N   Using the Toilet? N   In the past six months, have you accidently leaked urine? N    Do you have problems with loss of bowel control? N   Managing your Medications? N   Managing your Finances? N   Housekeeping or managing your Housekeeping? N     Patient Care Team: Wanda Plump, MD as PCP - General Little Ishikawa, MD as PCP - Cardiology (Cardiology) Heloise Purpura, MD as Consulting Physician (Urology) Carman Ching, MD (Inactive) as Consulting Physician (Gastroenterology) Sharyn Lull, Elvin So, MD as Referring Physician (Dermatology) Mack Hook, MD as Consulting Physician (Orthopedic Surgery) Estill Bamberg, MD as Consulting Physician (Orthopedic Surgery)  Indicate any recent Medical Services you may have received from other than [facility] providers in the past year (date may be approximate).     Assessment:   This is a routine wellness examination for Larry Elliott.  Hearing/Vision screen No results found.  Dietary issues and exercise activities discussed:     Goals Addressed   None    Depression Screen    04/18/2023    9:40 AM 11/24/2022    9:45 AM 10/21/2022   10:18 AM 07/22/2022   10:44 AM 05/23/2022   10:53 AM 01/25/2022    2:15 PM 08/18/2021   11:03 AM  PHQ 2/9 Scores  PHQ - 2 Score 0 0 0 0 0 0 0  PHQ- 9 Score  0  0 0 0 0    Fall Risk    04/17/2023   10:54 AM 10/21/2022   10:17 AM 07/22/2022   10:44 AM 01/25/2022    2:23 PM 08/18/2021   11:03 AM  Fall Risk   Falls in the past year? 1 0 0 0 0  Number falls in past yr: 1 0 0 0 0  Injury with Fall? 0 0 0 0 0  Risk for fall due to : History of fall(s)      Follow up Falls evaluation completed;Falls prevention discussed  Falls evaluation completed Falls evaluation completed Falls evaluation completed Falls evaluation completed    MEDICARE RISK AT HOME:  Medicare Risk at Home - 04/17/23 1054     If so, are there any without handrails? No             TIMED UP AND GO:  Was the test performed?  Yes  Length of time to ambulate 10 feet: 9 sec Gait slow and steady with assistive device     Cognitive Function:        04/18/2023    9:42 AM  6CIT Screen  What Year? 0 points  What month? 0 points  What time? 3 points  Count back from 20 0 points  Months in reverse 0 points  Repeat phrase 4 points  Total Score 7 points    Immunizations Immunization History  Administered Date(s) Administered   Fluad Quad(high Dose 65+) 07/09/2019, 08/03/2020, 08/18/2021, 07/22/2022   Influenza Whole 09/16/2009, 08/19/2010   Influenza, High Dose Seasonal PF 07/17/2015, 07/22/2016, 07/25/2017, 08/03/2018   Influenza,inj,Quad PF,6+ Mos 08/24/2013, 07/24/2014   PFIZER Comirnaty(Gray Top)Covid-19 Tri-Sucrose Vaccine 03/18/2021   PFIZER(Purple Top)SARS-COV-2 Vaccination 12/23/2019, 01/21/2020, 08/21/2020   PNEUMOCOCCAL CONJUGATE-20 01/25/2022   Pfizer Covid-19 Vaccine Bivalent Booster 61yrs & up 08/18/2021   Pneumococcal Conjugate-13 05/29/2014   Pneumococcal Polysaccharide-23 09/19/2008, 09/27/2013   Td 09/19/2008   Tdap 05/13/2019   Zoster Recombinat (Shingrix) 05/13/2019, 07/31/2019   Zoster, Live 04/16/2010    TDAP status: Up to date  Flu Vaccine status: Up to date  Pneumococcal vaccine status: Up to date  Covid-19 vaccine status: Information provided on how to obtain vaccines.   Qualifies for Shingles Vaccine? Yes   Zostavax completed Yes   Shingrix Completed?: Yes  Screening Tests Health Maintenance  Topic Date Due   Medicare Annual Wellness (AWV)  05/30/2015   COVID-19 Vaccine (6 - 2023-24 season) 06/24/2022   INFLUENZA VACCINE  05/25/2023   DTaP/Tdap/Td (3 - Td or Tdap) 05/12/2029   Pneumonia Vaccine 60+ Years old  Completed   Hepatitis C Screening  Completed   Zoster Vaccines- Shingrix  Completed   HPV VACCINES  Aged Out   Colonoscopy  Discontinued    Health Maintenance  Health Maintenance Due  Topic Date Due   Medicare Annual Wellness (AWV)  05/30/2015   COVID-19 Vaccine (6 - 2023-24 season) 06/24/2022    Colorectal cancer screening: No longer  required.   Lung Cancer Screening: (Low Dose CT Chest recommended if Age 55-80 years, 20 pack-year currently smoking OR have quit w/in 15years.) does not qualify.    Additional Screening:  Hepatitis C Screening: does qualify; Completed 06/04/15  Vision Screening: Recommended annual ophthalmology exams for early detection of glaucoma and other disorders of the eye. Is the patient up to date with their annual eye exam?  Yes  Who is the provider or what is the name of the office in which the patient attends annual eye exams? Doesn't remember name at this time If pt is not established with a provider, would they like to be referred to a provider to establish care? No .   Dental Screening: Recommended annual dental exams for proper oral hygiene  Diabetic Foot Exam: N/a  Community Resource Referral / Chronic Care Management: CRR required this visit?  No   CCM required this visit?  No     Plan:     I have personally reviewed and noted the following in the patient's chart:   Medical and social history Use of alcohol,  tobacco or illicit drugs  Current medications and supplements including opioid prescriptions. Patient is currently taking opioid prescriptions. Information provided to patient regarding non-opioid alternatives. Patient advised to discuss non-opioid treatment plan with their provider. Functional ability and status Nutritional status Physical activity Advanced directives List of other physicians Hospitalizations, surgeries, and ER visits in previous 12 months Vitals Screenings to include cognitive, depression, and falls Referrals and appointments  In addition, I have reviewed and discussed with patient certain preventive protocols, quality metrics, and best practice recommendations. A written personalized care plan for preventive services as well as general preventive health recommendations were provided to patient.     Donne Anon, CMA   04/18/2023   After Visit  Summary: Sent to mychart.  Nurse Notes: None

## 2023-04-19 ENCOUNTER — Telehealth: Payer: Self-pay | Admitting: Internal Medicine

## 2023-04-19 ENCOUNTER — Ambulatory Visit (INDEPENDENT_AMBULATORY_CARE_PROVIDER_SITE_OTHER): Payer: Medicare Other | Admitting: Internal Medicine

## 2023-04-19 ENCOUNTER — Encounter: Payer: Self-pay | Admitting: Internal Medicine

## 2023-04-19 ENCOUNTER — Telehealth: Payer: Self-pay

## 2023-04-19 ENCOUNTER — Telehealth: Payer: Self-pay | Admitting: *Deleted

## 2023-04-19 VITALS — BP 146/68 | HR 54 | Temp 97.7°F | Resp 20 | Ht 70.0 in | Wt 253.4 lb

## 2023-04-19 DIAGNOSIS — I5032 Chronic diastolic (congestive) heart failure: Secondary | ICD-10-CM

## 2023-04-19 DIAGNOSIS — I5031 Acute diastolic (congestive) heart failure: Secondary | ICD-10-CM

## 2023-04-19 DIAGNOSIS — G3184 Mild cognitive impairment, so stated: Secondary | ICD-10-CM | POA: Diagnosis not present

## 2023-04-19 DIAGNOSIS — R600 Localized edema: Secondary | ICD-10-CM

## 2023-04-19 DIAGNOSIS — Z91199 Patient's noncompliance with other medical treatment and regimen due to unspecified reason: Secondary | ICD-10-CM

## 2023-04-19 NOTE — Telephone Encounter (Signed)
Spoke with [name], see documentation at the office visit note

## 2023-04-19 NOTE — Progress Notes (Unsigned)
Subjective:    Patient ID: Larry Elliott, male    DOB: [birth year], 77 y.o.   MRN: [id number]  DOS:  04/19/2023 Type of visit - description: Routine checkup, here by himself.  Routine visit, states that he feels well. Significant weight gain noted. Patient is not concerned about it. Unable to tell me what medications he takes. Denies chest pain, difficulty breathing.  No palpitations. Emotionally states he is feeling well.  Wt Readings from Last 3 Encounters:  04/19/23 253 lb 6 oz (114.9 kg)  04/18/23 252 lb 6.4 oz (114.5 kg)  02/28/23 232 lb 12.8 oz (105.6 kg)     Review of Systems See above   Past Medical History:  Diagnosis Date   Allergy    penicillin & Aspirin   Anxiety 11/04/2013   Arthritis    BCC (basal cell carcinoma), leg    Right calf, L nape of neck at hairline   CAD (coronary artery disease)    CP, cath, non-obstructive   Cataract    been removed   Depression 11/04/2013   Diverticulosis    colon   Erectile dysfunction following radical prostatectomy    Hyperlipidemia    Hypertension    Insomnia    Melanoma (HCC) 2010   face, sees derm    Prostate CA (HCC) 07/2007   s/p robotic surgery   Spermatocele    Left    Past Surgical History:  Procedure Laterality Date   APPENDECTOMY     age 29   EYE SURGERY     cataracts removed   HERNIA REPAIR  2010   JOINT REPLACEMENT  hip 2024   KNEE ARTHROSCOPY Left 1965   meniscal tear   PROSTATECTOMY  07/25/2007    robotic,  for prostate cancer     SHOULDER ARTHROSCOPY Left 07/09/2013   Dr Ave Filter    SPINE SURGERY     TOTAL HIP ARTHROPLASTY Left 12/27/2022   Procedure: LEFT TOTAL HIP ARTHROPLASTY ANTERIOR APPROACH;  Surgeon: Marcene Corning, MD;  Location: [facility];  Service: Orthopedics;  Laterality: Left;   TRANSFORAMINAL LUMBAR INTERBODY FUSION (TLIF) WITH PEDICLE SCREW FIXATION 1 LEVEL Right 12/04/2019   Procedure: RIGHT-SIDED TRANSFORAMINAL LUMBAR INTERBODY FUSION LUMBAR FOUR THROUGH FIVE WITH  INSTRUMENTATION AND ALLOGRAFT;  Surgeon: Estill Bamberg, MD;  Location: [facility];  Service: Orthopedics;  Laterality: Right;    Current Outpatient Medications  Medication Instructions   atorvastatin (LIPITOR) 40 mg, Oral, Daily at bedtime   docusate sodium (COLACE) 100 mg, Oral, 2 times daily PRN   fenofibrate micronized (LOFIBRA) 134 mg, Oral, Daily before breakfast   furosemide (LASIX) 40 mg, Oral, 2 times daily   gabapentin (NEURONTIN) 100 mg, Oral, 3 times daily   melatonin 5 mg, Oral, Daily at bedtime   ondansetron (ZOFRAN) 4 mg, Oral, Every 8 hours PRN   oxyCODONE-acetaminophen (PERCOCET) 5-325 MG tablet 1 tablet, Oral, Every 6 hours PRN   potassium chloride SA (KLOR-CON M) 20 MEQ tablet 20 mEq, Oral, 2 times daily   sertraline (ZOLOFT) 50 mg, Oral, Daily       Objective:   Physical Exam BP (!) 146/68   Pulse (!) 54   Temp 97.7 F (36.5 C) (Oral)   Resp 20   Ht 5\' 10"  (1.778 m)   Wt 253 lb 6 oz (114.9 kg)   SpO2 94%   BMI 36.36 kg/m  General:   Well developed, NAD, BMI noted. HEENT:  Normocephalic . Face symmetric, atraumatic No JVD at 45 degrees Lungs:  CTA B Normal respiratory effort, no intercostal retractions, no accessory muscle use. Heart: RRR,  no murmur.  Lower extremities: Significant lower extremity edema bilaterally.   Skin: Not pale. Not jaundice Neurologic:  alert & oriented self, place, knows the year, did not know the day. Speech normal, gait appropriate for age and unassisted Psych--  Behavior appropriate. No anxious or depressed appearing.      Assessment    Assessment  Hyperglycemia   HTN Hyperlipidemia Anxiety depression insomnia   dc clonazepam - not effective,  8-16, rx trazodone. Metabolic encephalopathy admitted July 2023: No more Ambien  CV: --CAD, non-obstructive ---LE edema R>L (-) US DVT 2020 ---ABI 09/2019 WNL -- Diastolic CHF suspected, echo 02/2023 normal Prostate cancer-- 2008, robotic surgery, no incontinence, Dr Laverle Patter:  now f/u per PCP as of 05-2018 [facility] ED past surgery  spermatocele, left Skin --Melanoma 2010, BCCs  does not see derm regularly as off 01-2022   PLAN Compliance: Patient stated nobody is helping him w/ medications, he puts  them in a pillbox and takes them daily.  Does not recall the names of any of the medication or any  instructions. Lower extremity edema, suspected diastolic CHF: Significantly worsening of the edema, denies chest pain or difficulty breathing, O2 sat is 94%, no JVD at 45 degrees. Likely related to poor compliance. I called the wife twice, left message. I called the daughter and left a message. Plan: Refer to to community care ASAP.    Encouraged Lasix, see AVS Addendum: I spoke with the patient daughter [name], she is aware of the situation, she has been trying to talk him into going to ALF but Jonny Ruiz and his wife have refused. She will communicate with his father to be sure he is taking (at least) Lasix and potassium, plans to hire somebody to help them Jonny Ruiz and his wife) manage their  medications which I think is a great idea. MCI?  Mini cog score 4, negative screening.  Will need a more detailed testing at the next opportunity.  He seems to be pretty indifferent to his health. RTC 1 week  Time spent 45 minutes, trying to fevers out if he is compliant with medication, speaking with the family, coordinating his care.

## 2023-04-19 NOTE — Telephone Encounter (Signed)
I spoke with the daughter today, she plans to call his father today 04/19/2023.  At the very least we need to be sure he takes Lasix and potassium. Please call him tomorrow and see if they need a refill.

## 2023-04-19 NOTE — Patient Instructions (Addendum)
Larry Elliott,  It is important you take all your medications as prescribed.  Be sure you take furosemide 40 mg once a day.  If you have not taking any today go ahead and take your furosemide ASAP.  Be sure you take your potassium supplements  I am asking our team to check you at home.  If you develop chest pain, difficulty with your respiration, go to the ER.   Come back next week

## 2023-04-19 NOTE — Telephone Encounter (Signed)
Pt's wife stated she was advised to update pt's medication list. She stated she does not have him taking docusate sodium, melatonin, zofran or oxycodone. But has trazodone and ciprofloxacin. Please follow up with pt.

## 2023-04-19 NOTE — Progress Notes (Signed)
  Care Coordination  Outreach Note  04/19/2023 Name: BURMAN BRUINGTON MRN: [id number] DOB: 04/07/1946   Care Coordination Outreach Attempts: An unsuccessful telephone outreach was attempted today to offer the patient information about available care coordination services.  Follow Up Plan:  Additional outreach attempts will be made to offer the patient care coordination information and services.   Encounter Outcome:  No Answer  Burman Nieves, CCMA Care Coordination Care Guide Direct Dial: [phone]

## 2023-04-19 NOTE — Telephone Encounter (Signed)
[name] (daughter) is returned call during lunch. She can be reached at [phone]

## 2023-04-20 ENCOUNTER — Telehealth: Payer: Self-pay

## 2023-04-20 DIAGNOSIS — I5032 Chronic diastolic (congestive) heart failure: Secondary | ICD-10-CM | POA: Insufficient documentation

## 2023-04-20 NOTE — Telephone Encounter (Signed)
Left detailed message to call us back to let us know if he needs refill.

## 2023-04-20 NOTE — Progress Notes (Signed)
   Care Guide Note  04/20/2023 Name: [name] MRN: [id number] DOB: 08/13/1946  Referred by: Wanda Plump, MD Reason for referral : Care Coordination (Outreach to schedule with Pharm d )   [name] is a 77 y.o. year old male who is a primary care patient of Wanda Plump, MD. PHILEMON RIEDESEL was referred to the pharmacist for assistance related to DM.    An unsuccessful telephone outreach was attempted today to contact the patient who was referred to the pharmacy team for assistance with medication management. Additional attempts will be made to contact the patient.   Penne Lash, RMA Care Guide [facility]  [place], [place] [address] Direct Dial: [phone] [email or web address]

## 2023-04-20 NOTE — Progress Notes (Signed)
  Care Coordination  Outreach Note  04/20/2023 Name: Larry Elliott MRN: [id number] DOB: 1946/07/26   Care Coordination Outreach Attempts: A second unsuccessful outreach was attempted today to offer the patient with information about available care coordination services.  Follow Up Plan:  Additional outreach attempts will be made to offer the patient care coordination information and services.   Encounter Outcome:  No Answer  Burman Nieves, CCMA Care Coordination Care Guide Direct Dial: [phone]

## 2023-04-20 NOTE — Assessment & Plan Note (Addendum)
Compliance: Patient stated nobody is helping him w/ medications, he puts  them in a pillbox and takes them daily.  Does not recall the names of any of the medication or any  instructions. Lower extremity edema, suspected diastolic CHF: Significantly worsening of the edema, denies chest pain or difficulty breathing, O2 sat is 94%, no JVD at 45 degrees. Likely related to poor compliance. I called the wife twice, left message. I called the daughter and left a message. Plan: Refer to to community care ASAP.    Encouraged Lasix, see AVS Addendum: I spoke with the patient daughter Larry Elliott, she is aware of the situation, she has been trying to talk him into going to ALF but Larry Elliott and his wife have refused. She will communicate with his father to be sure he is taking (at least) Lasix and potassium, plans to hire somebody to help them Larry Elliott and his wife) manage their  medications which I think is a great idea. MCI?  Mini cog score 4, negative screening.  Will need a more detailed testing at the next opportunity.  He seems to be pretty indifferent to his health. RTC 1 week

## 2023-04-21 NOTE — Progress Notes (Signed)
  Care Coordination  Outreach Note  04/21/2023 Name: Larry Elliott MRN: [id number] DOB: Aug 10, 1946   Care Coordination Outreach Attempts: A third unsuccessful outreach was attempted today to offer the patient with information about available care coordination services.  Follow Up Plan:  No further outreach attempts will be made at this time. We have been unable to contact the patient to offer or enroll patient in care coordination services  Encounter Outcome:  No Answer  Burman Nieves, [facility] Care Coordination Care Guide Direct Dial: [phone]

## 2023-04-21 NOTE — Progress Notes (Signed)
   Care Guide Note  04/21/2023 Name: Larry Elliott MRN: [id number] DOB: Aug 12, 1946  Referred by: Wanda Plump, MD Reason for referral : Care Coordination (Outreach to schedule with Pharm d )   Larry Elliott is a 77 y.o. year old male who is a primary care patient of Wanda Plump, MD. Larry Elliott was referred to the pharmacist for assistance related to DM.    Successful contact was made with the patient to discuss pharmacy services including being ready for the pharmacist to call at least 5 minutes before the scheduled appointment time, to have medication bottles and any blood sugar or blood pressure readings ready for review. The patient agreed to meet with the pharmacist via with the pharmacist via telephone visit on (date/time).  04/26/2023   Penne Lash, RMA Care Guide [facility]  [place], [place] [address] Direct Dial: [phone]  [email or web address]

## 2023-04-26 ENCOUNTER — Telehealth: Payer: Medicare Other

## 2023-04-26 ENCOUNTER — Telehealth: Payer: Self-pay | Admitting: Pharmacist

## 2023-04-26 NOTE — Telephone Encounter (Signed)
2nd attempt to reach patient to Clinical Pharmacist Practitioner phone visit - no answer LM on VN with CB# [phone]

## 2023-04-26 NOTE — Telephone Encounter (Signed)
Unsuccessful outreach to patient to discuss medication adherence. LM on VM with CB# [phone] or [phone]

## 2023-04-27 ENCOUNTER — Other Ambulatory Visit: Payer: Self-pay | Admitting: Medical

## 2023-05-03 ENCOUNTER — Ambulatory Visit (INDEPENDENT_AMBULATORY_CARE_PROVIDER_SITE_OTHER): Payer: Medicare Other | Admitting: Internal Medicine

## 2023-05-03 ENCOUNTER — Encounter: Payer: Self-pay | Admitting: Internal Medicine

## 2023-05-03 VITALS — BP 138/84 | HR 87 | Temp 98.0°F | Resp 22 | Ht 70.0 in | Wt 246.1 lb

## 2023-05-03 DIAGNOSIS — E785 Hyperlipidemia, unspecified: Secondary | ICD-10-CM

## 2023-05-03 DIAGNOSIS — G3184 Mild cognitive impairment, so stated: Secondary | ICD-10-CM | POA: Diagnosis not present

## 2023-05-03 DIAGNOSIS — Z91199 Patient's noncompliance with other medical treatment and regimen due to unspecified reason: Secondary | ICD-10-CM | POA: Diagnosis not present

## 2023-05-03 DIAGNOSIS — I10 Essential (primary) hypertension: Secondary | ICD-10-CM | POA: Diagnosis not present

## 2023-05-03 DIAGNOSIS — R739 Hyperglycemia, unspecified: Secondary | ICD-10-CM | POA: Diagnosis not present

## 2023-05-03 LAB — BASIC METABOLIC PANEL
BUN: 15 mg/dL (ref 6–23)
CO2: 27 mEq/L (ref 19–32)
Calcium: 9.3 mg/dL (ref 8.4–10.5)
Chloride: 107 mEq/L (ref 96–112)
Creatinine, Ser: 0.69 mg/dL (ref 0.40–1.50)
GFR: 88.04 mL/min (ref >60.00)
Glucose, Bld: 93 mg/dL (ref 70–99)
Potassium: 5 mEq/L (ref 3.5–5.1)
Sodium: 142 mEq/L (ref 135–145)

## 2023-05-03 LAB — HEMOGLOBIN A1C: Hgb A1c MFr Bld: 6.3 % (ref 4.6–6.5)

## 2023-05-03 LAB — LIPID PANEL
Cholesterol: 138 mg/dL (ref 0–200)
HDL: 40.5 mg/dL (ref >39.00)
NonHDL: 97
Total CHOL/HDL Ratio: 3
Triglycerides: 315 mg/dL — ABNORMAL HIGH (ref 0.0–149.0)
VLDL: 63 mg/dL — ABNORMAL HIGH (ref 0.0–40.0)

## 2023-05-03 LAB — LDL CHOLESTEROL, DIRECT: Direct LDL: 53 mg/dL

## 2023-05-03 NOTE — Progress Notes (Signed)
Subjective:    Patient ID: Larry Elliott, male    DOB: 1946/02/03, 77 y.o.   MRN: [id number]  DOS:  05/03/2023 Type of visit - description: Follow-up  Feels well, things are about the same   No CP-SPB No N-V  Wt Readings from Last 3 Encounters:  05/03/23 246 lb 2 oz (111.6 kg)  04/19/23 253 lb 6 oz (114.9 kg)  04/18/23 252 lb 6.4 oz (114.5 kg)    BP Readings from Last 3 Encounters:  05/03/23 138/84  04/19/23 (!) 146/68  04/18/23 128/73     Review of Systems See above   Past Medical History:  Diagnosis Date   Allergy    penicillin & Aspirin   Anxiety 11/04/2013   Arthritis    BCC (basal cell carcinoma), leg    Right calf, L nape of neck at hairline   CAD (coronary artery disease)    CP, cath, non-obstructive   Cataract    been removed   Depression 11/04/2013   Diverticulosis    colon   Erectile dysfunction following radical prostatectomy    Hyperlipidemia    Hypertension    Insomnia    Melanoma (HCC) 2010   face, sees derm    Prostate CA (HCC) 07/2007   s/p robotic surgery   Spermatocele    Left    Past Surgical History:  Procedure Laterality Date   APPENDECTOMY     age 81   EYE SURGERY     cataracts removed   HERNIA REPAIR  2010   JOINT REPLACEMENT  hip 2024   KNEE ARTHROSCOPY Left 1965   meniscal tear   PROSTATECTOMY  07/25/2007    robotic,  for prostate cancer     SHOULDER ARTHROSCOPY Left 07/09/2013   Dr Ave Filter    SPINE SURGERY     TOTAL HIP ARTHROPLASTY Left 12/27/2022   Procedure: LEFT TOTAL HIP ARTHROPLASTY ANTERIOR APPROACH;  Surgeon: Marcene Corning, MD;  Location: [facility];  Service: Orthopedics;  Laterality: Left;   TRANSFORAMINAL LUMBAR INTERBODY FUSION (TLIF) WITH PEDICLE SCREW FIXATION 1 LEVEL Right 12/04/2019   Procedure: RIGHT-SIDED TRANSFORAMINAL LUMBAR INTERBODY FUSION LUMBAR FOUR THROUGH FIVE WITH INSTRUMENTATION AND ALLOGRAFT;  Surgeon: Estill Bamberg, MD;  Location: [facility];  Service: Orthopedics;  Laterality: Right;     Current Outpatient Medications  Medication Instructions   atorvastatin (LIPITOR) 40 mg, Oral, Daily at bedtime   docusate sodium (COLACE) 100 mg, Oral, 2 times daily PRN   fenofibrate micronized (LOFIBRA) 134 mg, Oral, Daily before breakfast   furosemide (LASIX) 40 mg, Oral, 2 times daily   gabapentin (NEURONTIN) 100 mg, Oral, 3 times daily   melatonin 5 mg, Oral, Daily at bedtime   ondansetron (ZOFRAN) 4 mg, Oral, Every 8 hours PRN   oxyCODONE-acetaminophen (PERCOCET) 5-325 MG tablet 1 tablet, Oral, Every 6 hours PRN   potassium chloride SA (KLOR-CON M) 20 MEQ tablet 20 mEq, Oral, 2 times daily   sertraline (ZOLOFT) 50 mg, Oral, Daily       Objective:   Physical Exam BP 138/84   Pulse 87   Temp 98 F (36.7 C) (Oral)   Resp (!) 22   Ht 5\' 10"  (1.778 m)   Wt 246 lb 2 oz (111.6 kg)   SpO2 94%   BMI 35.32 kg/m  General:   Well developed, NAD, BMI noted. HEENT:  Normocephalic . Face symmetric, atraumatic Lungs:  CTA B Normal respiratory effort, no intercostal retractions, no accessory muscle use. Heart: RRR,  no murmur.  Lower extremities: Decreased compared to last office.  Still ++/+++ Skin: Not pale. Not jaundice Neurologic:  alert & oriented X3. MMSE 26 Psych--  .  Behavior appropriate. No anxious or depressed appearing.      Assessment     Assessment  Hyperglycemia   HTN Hyperlipidemia Anxiety depression insomnia   dc clonazepam - not effective,  8-16, rx trazodone. Metabolic encephalopathy admitted July 2023: No more Ambien  CV: --CAD, non-obstructive ---LE edema R>L (-) US DVT 2020 ---ABI 09/2019 WNL -- Diastolic CHF suspected, echo 02/2023 normal Prostate cancer-- 2008, robotic surgery, no incontinence, Dr Laverle Patter: now f/u per PCP as of 05-2018 [facility] ED past surgery  spermatocele, left Skin --Melanoma 2010, BCCs  does not see derm regularly as off 01-2022   PLAN Compliance: Multiple attempts made for care coordination and to be evaluated by our  clinical pharmacist: Unsuccessful Swelling has has decreased significantly, weight is down, suspect he is taking his medication more regularly. Again the patient reports his wife placed pills in the box and he takes them.  He personally does not know what he is taking.  Edema, CHF: Much improved, see above. Hyperglycemia: Check A1c HTN: BP is very good today, continue Lasix, potassium supplements, check BMP High cholesterol: On atorvastatin, check FLP MCI: Test was somewhat limited by [facility], MMSE: 26,  mild deficit, check RPR.   LMOM for  daughter [name], like to get her observation regards memory and opinion about starting Aricept RTC 3 months.

## 2023-05-03 NOTE — Patient Instructions (Addendum)
Continue the same medications, please ask your wife to follow the medication list.  Vaccines I recommend: Covid booster Flu shot this fall RSV vaccine     GO TO THE LAB : Get the blood work     GO TO THE FRONT DESK, PLEASE SCHEDULE YOUR APPOINTMENTS Come back for   checkup in 3 months

## 2023-05-04 ENCOUNTER — Telehealth: Payer: Self-pay

## 2023-05-04 LAB — RPR: RPR Ser Ql: NONREACTIVE

## 2023-05-04 NOTE — Telephone Encounter (Signed)
Per Dr. Sarita Bottom call the patient's daughter [name], tell her that his father has memory loss, not severe.  We could prescribe Aricept which is good, the downside is that it can create some more confusion- temporarily .  If she is really worried about his memory we could do it if she is not we can reassess in few months.  Just let me know what she thinks and I will document in the office visit

## 2023-05-04 NOTE — Telephone Encounter (Signed)
LMOM asking for call back from [place].

## 2023-05-05 DIAGNOSIS — F0390 Unspecified dementia without behavioral disturbance: Secondary | ICD-10-CM | POA: Insufficient documentation

## 2023-05-05 DIAGNOSIS — G3184 Mild cognitive impairment, so stated: Secondary | ICD-10-CM | POA: Insufficient documentation

## 2023-05-05 NOTE — Telephone Encounter (Signed)
Spoke with [name] about her dad. She is on the same page with Dr. Drue Novel and definitely has noticed memory decline. She has tried to persuade her parents to go into some sort of assisted living and they refuse. She has also hired help to check on them weekly and fill up pill containers and they refuse the help. She would like for patient to start Aricept. Please send to pharmacy.

## 2023-05-05 NOTE — Assessment & Plan Note (Signed)
Compliance: Multiple attempts made for care coordination and to be evaluated by our clinical pharmacist: Unsuccessful Swelling has has decreased significantly, weight is down, suspect he is taking his medication more regularly. Again the patient reports his wife placed pills in the box and he takes them.  He personally does not know what he is taking.  Edema, CHF: Much improved, see above. Hyperglycemia: Check A1c HTN: BP is very good today, continue Lasix, potassium supplements, check BMP High cholesterol: On atorvastatin, check FLP MCI: Test was somewhat limited by [facility], MMSE: 26,  mild deficit, check RPR.   LMOM for  daughter [name], like to get her observation regards memory and opinion about starting Aricept RTC 3 months.

## 2023-05-08 ENCOUNTER — Other Ambulatory Visit: Payer: Self-pay | Admitting: Medical

## 2023-05-08 MED ORDER — DONEPEZIL HCL 5 MG PO TABS: 5.0000 mg | ORAL_TABLET | Freq: Every day | ORAL | 2 refills | Status: DC

## 2023-05-08 NOTE — Telephone Encounter (Signed)
LMOM informing [name] that Rx has been sent. Made her aware that it may cause temporary confusion for first few days to let us know if not improving. Rx sent to [facility].

## 2023-05-08 NOTE — Telephone Encounter (Signed)
Aricept 5 mg 1 p.o. daily #30, 2 refills. Notified the patient's wife (who arrange medications for him) that I recommend to start Aricept. Watch for transient increased confusion Notify patient's daughter will send the prescription

## 2023-05-08 NOTE — Progress Notes (Unsigned)
Cardiology Office Note:    Date:  05/10/2023   ID:  Larry Elliott, DOB 1946-02-04, MRN [id number]  PCP:  Wanda Plump, MD  Cardiologist:  Little Ishikawa, MD  Electrophysiologist:  None   Referring MD: Wanda Plump, MD   Chief Complaint  Patient presents with   Edema    History of Present Illness:    Larry Elliott is a 77 y.o. male with a hx of nonobstructive CAD, hypertension, hyperlipidemia, melanoma, prostate cancer who presents for follow-up.  He was referred by Dr. Drue Novel for preop evaluation prior to hip surgery, initially seen 12/19/2022.    Echocardiogram 04/2022 showed EF 50 to 55%, inferior basal hypokinesis, mild RV dysfunction, ascending aorta measuring 44 mm.  Cath in 2011 showed minimal CAD.  Echocardiogram 03/10/2023 showed EF 60 to 65%, normal diastolic function, normal RV function, no significant valvular disease, RAP 3 mmHg.  Since last clinic visit, reports that he is doing okay.  Reports edema has improved.  Denies any chest pain, dyspnea, lightheadedness, syncope, or palpitations.  Wt Readings from Last 3 Encounters:  05/03/23 246 lb 2 oz (111.6 kg)  04/19/23 253 lb 6 oz (114.9 kg)  04/18/23 252 lb 6.4 oz (114.5 kg)   BP Readings from Last 3 Encounters:  05/10/23 (!) 150/78  05/03/23 138/84  04/19/23 (!) 146/68       Past Medical History:  Diagnosis Date   Allergy    penicillin & Aspirin   Anxiety 11/04/2013   Arthritis    BCC (basal cell carcinoma), leg    Right calf, L nape of neck at hairline   CAD (coronary artery disease)    CP, cath, non-obstructive   Cataract    been removed   Depression 11/04/2013   Diverticulosis    colon   Erectile dysfunction following radical prostatectomy    Hyperlipidemia    Hypertension    Insomnia    Melanoma (HCC) 2010   face, sees derm    Prostate CA (HCC) 07/2007   s/p robotic surgery   Spermatocele    Left    Past Surgical History:  Procedure Laterality Date   APPENDECTOMY     age 80    EYE SURGERY     cataracts removed   HERNIA REPAIR  2010   JOINT REPLACEMENT  hip 2024   KNEE ARTHROSCOPY Left 1965   meniscal tear   PROSTATECTOMY  07/25/2007    robotic,  for prostate cancer     SHOULDER ARTHROSCOPY Left 07/09/2013   Dr Ave Filter    SPINE SURGERY     TOTAL HIP ARTHROPLASTY Left 12/27/2022   Procedure: LEFT TOTAL HIP ARTHROPLASTY ANTERIOR APPROACH;  Surgeon: Marcene Corning, MD;  Location: [facility];  Service: Orthopedics;  Laterality: Left;   TRANSFORAMINAL LUMBAR INTERBODY FUSION (TLIF) WITH PEDICLE SCREW FIXATION 1 LEVEL Right 12/04/2019   Procedure: RIGHT-SIDED TRANSFORAMINAL LUMBAR INTERBODY FUSION LUMBAR FOUR THROUGH FIVE WITH INSTRUMENTATION AND ALLOGRAFT;  Surgeon: Estill Bamberg, MD;  Location: [facility];  Service: Orthopedics;  Laterality: Right;    Current Medications: Current Meds  Medication Sig   atorvastatin (LIPITOR) 40 MG tablet Take 1 tablet (40 mg total) by mouth at bedtime.   docusate sodium (COLACE) 100 MG capsule Take 1 capsule (100 mg total) by mouth 2 (two) times daily as needed for mild constipation.   donepezil (ARICEPT) 5 MG tablet Take 1 tablet (5 mg total) by mouth at bedtime.   fenofibrate micronized (LOFIBRA) 134 MG capsule Take 1  capsule (134 mg total) by mouth daily before breakfast.   furosemide (LASIX) 40 MG tablet Take 1 tablet (40 mg total) by mouth 2 (two) times daily.   gabapentin (NEURONTIN) 100 MG capsule Take 1 capsule (100 mg total) by mouth 3 (three) times daily.   melatonin 5 MG TABS Take 1 tablet (5 mg total) by mouth at bedtime.   ondansetron (ZOFRAN) 4 MG tablet TAKE 1 TABLET BY MOUTH EVERY 8 HOURS AS NEEDED FOR NAUSEA OR VOMITING   oxyCODONE-acetaminophen (PERCOCET) 5-325 MG tablet Take 1 tablet by mouth every 6 (six) hours as needed for moderate pain or severe pain (post op pain).   potassium chloride SA (KLOR-CON M) 20 MEQ tablet Take 1 tablet (20 mEq total) by mouth 2 (two) times daily.   sertraline (ZOLOFT) 50 MG tablet Take  1 tablet (50 mg total) by mouth daily.     Allergies:   Ambien [zolpidem], Aspirin, Hydrocodone, and Penicillins   Social History   Socioeconomic History   Marital status: Married    Spouse name: Not on file   Number of children: 2   Years of education: Not on file   Highest education level: Some college, no degree  Occupational History   Occupation: retired Emergency planning/management officer   Tobacco Use   Smoking status: Never   Smokeless tobacco: Never  Vaping Use   Vaping status: Never Used  Substance and Sexual Activity   Alcohol use: Not Currently    Alcohol/week: 1.0 standard drink of alcohol    Types: 1 Glasses of wine per week    Comment: fruit glass size   Drug use: Never   Sexual activity: Not on file  Other Topics Concern   Not on file  Social History Narrative   Lives w/ wife    Original from [place]   Social Determinants of Health   Financial Resource Strain: Low Risk  (01/16/2023)   Overall Financial Resource Strain (CARDIA)    Difficulty of Paying Living Expenses: Not hard at all  Food Insecurity: No Food Insecurity (01/16/2023)   Hunger Vital Sign    Worried About Running Out of Food in the Last Year: Never true    Ran Out of Food in the Last Year: Never true  Transportation Needs: No Transportation Needs (01/16/2023)   PRAPARE - Administrator, Civil Service (Medical): No    Lack of Transportation (Non-Medical): No  Physical Activity: Unknown (01/16/2023)   Exercise Vital Sign    Days of Exercise per Week: 0 days    Minutes of Exercise per Session: Not on file  Stress: Stress Concern Present (01/16/2023)   [employer] of Occupational Health - Occupational Stress Questionnaire    Feeling of Stress : Rather much  Social Connections: Moderately Isolated (01/16/2023)   Social Connection and Isolation Panel [NHANES]    Frequency of Communication with Friends and Family: Once a week    Frequency of Social Gatherings with Friends and Family: Once a week     Attends Religious Services: 1 to 4 times per year    Active Member of [employer] or Organizations: No    Attends Engineer, structural: Not on file    Marital Status: Married     Family History: The patient's family history includes Coronary artery disease (age of onset: 24) in his father; Stroke in his mother. There is no history of Hypertension, Diabetes, Colon cancer, or Prostate cancer.  ROS:   Please see the history of present illness.  All other systems reviewed and are negative.  EKGs/Labs/Other Studies Reviewed:    The following studies were reviewed today:   EKG:   12/19/2022: Normal sinus rhythm, rate 101, Q waves in leads III, aVF  Recent Labs: 11/24/2022: B Natriuretic Peptide 19.7 01/09/2023: Pro B Natriuretic peptide (BNP) 14.0 02/08/2023: Magnesium 2.3 02/28/2023: ALT 26; Hemoglobin 14.0; Platelets 367.0; TSH 1.20 05/03/2023: BUN 15; Creatinine, Ser 0.69; Potassium 5.0; Sodium 142  Recent Lipid Panel    Component Value Date/Time   CHOL 138 05/03/2023 1158   TRIG 315.0 (H) 05/03/2023 1158   TRIG 276 (HH) 09/05/2006 0835   HDL 40.50 05/03/2023 1158   CHOLHDL 3 05/03/2023 1158   VLDL 63.0 (H) 05/03/2023 1158   LDLCALC 56 02/19/2013 1624   LDLDIRECT 53.0 05/03/2023 1158    Physical Exam:    VS:  BP (!) 150/78   Pulse 89   SpO2 97%     Wt Readings from Last 3 Encounters:  05/03/23 246 lb 2 oz (111.6 kg)  04/19/23 253 lb 6 oz (114.9 kg)  04/18/23 252 lb 6.4 oz (114.5 kg)     GEN:  Well nourished, well developed in no acute distress HEENT: Normal NECK: No JVD; No carotid bruits CARDIAC: RRR, no murmurs, rubs, gallops RESPIRATORY:  Clear to auscultation without rales, wheezing or rhonchi  ABDOMEN: Soft, non-tender, non-distended MUSCULOSKELETAL:  1+ BLE edema; No deformity  SKIN: Warm and dry NEUROLOGIC:  Alert and oriented x 3 PSYCHIATRIC:  Normal affect   ASSESSMENT:    1. Bilateral leg edema   2. Essential hypertension   3. Aortic dilatation  (HCC)   4. Coronary artery disease involving native coronary artery of native heart without angina pectoris   5. Hyperlipidemia, unspecified hyperlipidemia type      PLAN:    Lower extremity edema: Has been having swelling in legs since his hip surgery 12/2022.  Diastolic heart failure suspected.  Lower extremity duplex 01/18/2023 showed no evidence of DVT.  proBNP 14 on 3/18.  Reports edema improved, taking lasix 40 mg twice daily and potassium 20 meq twice daily.  Echocardiogram 03/10/2023 showed EF 60 to 65%, normal diastolic function, normal RV function, no significant valvular disease, RAP 3 mmHg. - Has 1+ lower extremity edema on exam today but otherwise does not appear volume overloaded on exam, no JVD and lungs appear clear.  Can continue maintenance dose of Lasix.  Recent labs 05/03/2023 showed stable creatinine and electrolytes.  Encouraged to use compression stockings to help with lower extremity edema  Aortic dilatation: Ascending aorta measured 44 mm on echocardiogram 04/2022.  Recommend CTA chest to evaluate aorta  Nonobstructive CAD: Cath in 2011 showed minimal CAD.  Continue statin  Hyperlipidemia: On atorvastatin 40 mg daily.  LDL 53 on 05/03/2023  Hypertension: Previously was on enalapril 5 mg daily, not currently listed on medication list but he is unsure if he is taking.  BP elevated in clinic today.  Will schedule appointment in pharmacy hypertension clinic in 2 weeks.  Asked him to bring all his medications with him.  Asked him to check his BP daily for next 2-weeks and bring BP log and home BP monitor to calibrate to appointment.   RTC in 6 months  Medication Adjustments/Labs and Tests Ordered: Current medicines are reviewed at length with the patient today.  Concerns regarding medicines are outlined above.  Orders Placed This Encounter  Procedures   AMB Referral to [facility]   No orders of the defined types were  placed in this encounter.   Patient Instructions   Medication Instructions:  Your physician recommends that you continue on your current medications as directed. Please refer to the Current Medication list given to you today.  *If you need a refill on your cardiac medications before your next appointment, please call your pharmacy*     Follow-Up: At [facility], you and your health needs are our priority.  As part of our continuing mission to provide you with exceptional heart care, we have created designated Provider Care Teams.  These Care Teams include your primary Cardiologist (physician) and Advanced Practice Providers (APPs -  Physician Assistants and Nurse Practitioners) who all work together to provide you with the care you need, when you need it.  We recommend signing up for the patient portal called "MyChart".  Sign up information is provided on this After Visit Summary.  MyChart is used to connect with patients for Virtual Visits (Telemedicine).  Patients are able to view lab/test results, encounter notes, upcoming appointments, etc.  Non-urgent messages can be sent to your provider as well.   To learn more about what you can do with MyChart, go to [email or web address].    Your next appointment:   6 month(s)  Provider:   Little Ishikawa, MD     Other Instructions Dr. Bjorn Pippin has requested that you schedule an appointment with one of our clinical pharmacists for a blood pressure check appointment within the next 2-3 weeks.   **Please make sure to bring all your medications and your blood pressure cuff to this appointment.**  If you monitor your blood pressure (BP) at home, please bring your BP cuff and your BP readings with you to this appointment  HOW TO TAKE YOUR BLOOD PRESSURE: Rest 5 minutes before taking your blood pressure. Don't smoke or drink caffeinated beverages for at least 30 minutes before. Take your blood pressure before (not after) you eat. Sit comfortably with your back supported and  both feet on the floor (don't cross your legs). Elevate your arm to heart level on a table or a desk. Use the proper sized cuff. It should fit smoothly and snugly around your bare upper arm. There should be enough room to slip a fingertip under the cuff. The bottom edge of the cuff should be 1 inch above the crease of the elbow. Ideally, take 3 measurements at one sitting and record the average.  -Dr. Bjorn Pippin recommends getting compression stockings to wear during the day.      Signed, Little Ishikawa, MD  05/10/2023 5:21 PM    [facility]

## 2023-05-10 ENCOUNTER — Encounter: Payer: Self-pay | Admitting: Cardiology

## 2023-05-10 ENCOUNTER — Ambulatory Visit: Payer: Medicare Other | Attending: Cardiology | Admitting: Cardiology

## 2023-05-10 VITALS — BP 150/78 | HR 89

## 2023-05-10 DIAGNOSIS — I2510 Atherosclerotic heart disease of native coronary artery without angina pectoris: Secondary | ICD-10-CM | POA: Diagnosis not present

## 2023-05-10 DIAGNOSIS — E785 Hyperlipidemia, unspecified: Secondary | ICD-10-CM

## 2023-05-10 DIAGNOSIS — I77819 Aortic ectasia, unspecified site: Secondary | ICD-10-CM

## 2023-05-10 DIAGNOSIS — R600 Localized edema: Secondary | ICD-10-CM | POA: Diagnosis not present

## 2023-05-10 DIAGNOSIS — I10 Essential (primary) hypertension: Secondary | ICD-10-CM | POA: Diagnosis present

## 2023-05-10 NOTE — Patient Instructions (Addendum)
Medication Instructions:  Your physician recommends that you continue on your current medications as directed. Please refer to the Current Medication list given to you today.  *If you need a refill on your cardiac medications before your next appointment, please call your pharmacy*     Follow-Up: At [facility], you and your health needs are our priority.  As part of our continuing mission to provide you with exceptional heart care, we have created designated Provider Care Teams.  These Care Teams include your primary Cardiologist (physician) and Advanced Practice Providers (APPs -  Physician Assistants and Nurse Practitioners) who all work together to provide you with the care you need, when you need it.  We recommend signing up for the patient portal called "MyChart".  Sign up information is provided on this After Visit Summary.  MyChart is used to connect with patients for Virtual Visits (Telemedicine).  Patients are able to view lab/test results, encounter notes, upcoming appointments, etc.  Non-urgent messages can be sent to your provider as well.   To learn more about what you can do with MyChart, go to [email or web address].    Your next appointment:   6 month(s)  Provider:   Little Ishikawa, MD     Other Instructions Dr. Bjorn Pippin has requested that you schedule an appointment with one of our clinical pharmacists for a blood pressure check appointment within the next 2-3 weeks.   **Please make sure to bring all your medications and your blood pressure cuff to this appointment.**  If you monitor your blood pressure (BP) at home, please bring your BP cuff and your BP readings with you to this appointment  HOW TO TAKE YOUR BLOOD PRESSURE: Rest 5 minutes before taking your blood pressure. Don't smoke or drink caffeinated beverages for at least 30 minutes before. Take your blood pressure before (not after) you eat. Sit comfortably with your back supported and both  feet on the floor (don't cross your legs). Elevate your arm to heart level on a table or a desk. Use the proper sized cuff. It should fit smoothly and snugly around your bare upper arm. There should be enough room to slip a fingertip under the cuff. The bottom edge of the cuff should be 1 inch above the crease of the elbow. Ideally, take 3 measurements at one sitting and record the average.  -Dr. Bjorn Pippin recommends getting compression stockings to wear during the day.

## 2023-05-23 ENCOUNTER — Other Ambulatory Visit: Payer: Self-pay | Admitting: Internal Medicine

## 2023-05-23 ENCOUNTER — Other Ambulatory Visit: Payer: Self-pay | Admitting: Medical

## 2023-06-01 ENCOUNTER — Ambulatory Visit: Payer: Medicare Other | Attending: Internal Medicine

## 2023-06-01 NOTE — Progress Notes (Deleted)
Patient ID: QUINN WINTERBOTTOM                 DOB: Apr 10, 1946                      MRN: [id number]     HPI: Larry Elliott is a 77 y.o. male referred by Dr. Marland Kitchen to HTN clinic. PMH is significant for  Current HTN meds:  Previously tried:  BP goal:   Family History:   Social History:   Diet:   Exercise:   Home BP readings:   Wt Readings from Last 3 Encounters:  05/03/23 246 lb 2 oz (111.6 kg)  04/19/23 253 lb 6 oz (114.9 kg)  04/18/23 252 lb 6.4 oz (114.5 kg)   BP Readings from Last 3 Encounters:  05/10/23 (!) 150/78  05/03/23 138/84  04/19/23 (!) 146/68   Pulse Readings from Last 3 Encounters:  05/10/23 89  05/03/23 87  04/19/23 (!) 54    Renal function: CrCl cannot be calculated (Patient's most recent lab result is older than the maximum 21 days allowed.).  Past Medical History:  Diagnosis Date   Allergy    penicillin & Aspirin   Anxiety 11/04/2013   Arthritis    BCC (basal cell carcinoma), leg    Right calf, L nape of neck at hairline   CAD (coronary artery disease)    CP, cath, non-obstructive   Cataract    been removed   Depression 11/04/2013   Diverticulosis    colon   Erectile dysfunction following radical prostatectomy    Hyperlipidemia    Hypertension    Insomnia    Melanoma (HCC) 2010   face, sees derm    Prostate CA (HCC) 07/2007   s/p robotic surgery   Spermatocele    Left    Current Outpatient Medications on File Prior to Visit  Medication Sig Dispense Refill   atorvastatin (LIPITOR) 40 MG tablet Take 1 tablet (40 mg total) by mouth at bedtime. 90 tablet 1   docusate sodium (COLACE) 100 MG capsule Take 1 capsule (100 mg total) by mouth 2 (two) times daily as needed for mild constipation. 10 capsule 0   donepezil (ARICEPT) 5 MG tablet Take 1 tablet (5 mg total) by mouth at bedtime. 30 tablet 2   fenofibrate micronized (LOFIBRA) 134 MG capsule Take 1 capsule (134 mg total) by mouth daily before breakfast. 90 capsule 1   furosemide  (LASIX) 40 MG tablet Take 1 tablet (40 mg total) by mouth 2 (two) times daily. 60 tablet 0   gabapentin (NEURONTIN) 100 MG capsule Take 1 capsule (100 mg total) by mouth 3 (three) times daily. 90 capsule 3   melatonin 5 MG TABS Take 1 tablet (5 mg total) by mouth at bedtime. 30 tablet 0   ondansetron (ZOFRAN) 4 MG tablet TAKE 1 TABLET BY MOUTH EVERY 8 HOURS AS NEEDED FOR NAUSEA OR VOMITING 20 tablet 0   oxyCODONE-acetaminophen (PERCOCET) 5-325 MG tablet Take 1 tablet by mouth every 6 (six) hours as needed for moderate pain or severe pain (post op pain). 20 tablet 0   potassium chloride SA (KLOR-CON M) 20 MEQ tablet Take 1 tablet (20 mEq total) by mouth 2 (two) times daily. 180 tablet 1   sertraline (ZOLOFT) 50 MG tablet Take 1 tablet (50 mg total) by mouth daily. 30 tablet 3   No current facility-administered medications on file prior to visit.    Allergies  Allergen Reactions  Ambien [Zolpidem]     Encephalopathy, admitted 04-2022, symptoms suspected to be from [place]   Aspirin Other (See Comments)    REACTION: swelling in face   Hydrocodone     Insomnia, mild SOB (w/o cough-wheezing)   Penicillins Other (See Comments)    REACTION: swelling in face  Did it involve swelling of the face/tongue/throat, SOB, or low BP? Yes Did it involve sudden or severe rash/hives, skin peeling, or any reaction on the inside of your mouth or nose? No Did you need to seek medical attention at a hospital or doctor's office? Yes When did it last happen?      [id number] played Hockey If all above answers are "NO", may proceed with cephalosporin use.     Assessment/Plan:  1. Hypertension -

## 2023-06-02 NOTE — Telephone Encounter (Signed)
3rd attempt to reach patient. LM on VM to call office or my direct line to make appointment for medication review. I would prefer a face to face appointment if possible but can do phone appointment if that is more convenient for patient.  CB# [phone] or [phone]   Henrene Pastor, PharmD

## 2023-07-25 ENCOUNTER — Other Ambulatory Visit: Payer: Self-pay | Admitting: Family

## 2023-07-25 ENCOUNTER — Encounter: Payer: Self-pay | Admitting: Family

## 2023-07-25 ENCOUNTER — Other Ambulatory Visit (HOSPITAL_BASED_OUTPATIENT_CLINIC_OR_DEPARTMENT_OTHER): Payer: Self-pay

## 2023-07-25 ENCOUNTER — Telehealth: Payer: Self-pay | Admitting: Internal Medicine

## 2023-07-25 ENCOUNTER — Ambulatory Visit (INDEPENDENT_AMBULATORY_CARE_PROVIDER_SITE_OTHER): Payer: Medicare Other | Admitting: Family

## 2023-07-25 ENCOUNTER — Ambulatory Visit (HOSPITAL_BASED_OUTPATIENT_CLINIC_OR_DEPARTMENT_OTHER)
Admission: RE | Admit: 2023-07-25 | Discharge: 2023-07-25 | Disposition: A | Discharge status: Home / Self Care | Payer: Medicare Other | Source: Ambulatory Visit | Attending: Family | Admitting: Family

## 2023-07-25 VITALS — BP 148/82 | HR 92 | Resp 20 | Ht 70.0 in | Wt 253.8 lb

## 2023-07-25 DIAGNOSIS — I5032 Chronic diastolic (congestive) heart failure: Secondary | ICD-10-CM | POA: Diagnosis not present

## 2023-07-25 DIAGNOSIS — R600 Localized edema: Secondary | ICD-10-CM | POA: Insufficient documentation

## 2023-07-25 DIAGNOSIS — R0989 Other specified symptoms and signs involving the circulatory and respiratory systems: Secondary | ICD-10-CM | POA: Diagnosis not present

## 2023-07-25 DIAGNOSIS — L0390 Cellulitis, unspecified: Secondary | ICD-10-CM | POA: Diagnosis not present

## 2023-07-25 LAB — CBC WITH DIFFERENTIAL/PLATELET
Basophils Absolute: 0.1 10*3/uL (ref 0.0–0.1)
Basophils Relative: 0.9 % (ref 0.0–3.0)
Eosinophils Absolute: 0.2 10*3/uL (ref 0.0–0.7)
Eosinophils Relative: 1.8 % (ref 0.0–5.0)
HCT: 44.1 % (ref 39.0–52.0)
Hemoglobin: 14.1 g/dL (ref 13.0–17.0)
Lymphocytes Relative: 12.7 % (ref 12.0–46.0)
Lymphs Abs: 1.1 10*3/uL (ref 0.7–4.0)
MCHC: 32 g/dL (ref 30.0–36.0)
MCV: 84.9 fL (ref 78.0–100.0)
Monocytes Absolute: 0.9 10*3/uL (ref 0.1–1.0)
Monocytes Relative: 10.3 % (ref 3.0–12.0)
Neutro Abs: 6.7 10*3/uL (ref 1.4–7.7)
Neutrophils Relative %: 74.3 % (ref 43.0–77.0)
Platelets: 411 10*3/uL — ABNORMAL HIGH (ref 150.0–400.0)
RBC: 5.2 Mil/uL (ref 4.22–5.81)
RDW: 15.1 % (ref 11.5–15.5)
WBC: 9 10*3/uL (ref 4.0–10.5)

## 2023-07-25 LAB — COMPREHENSIVE METABOLIC PANEL
ALT: 24 U/L (ref 0–53)
AST: 16 U/L (ref 0–37)
Albumin: 4 g/dL (ref 3.5–5.2)
Alkaline Phosphatase: 79 U/L (ref 39–117)
BUN: 13 mg/dL (ref 6–23)
CO2: 27 meq/L (ref 19–32)
Calcium: 9.6 mg/dL (ref 8.4–10.5)
Chloride: 104 meq/L (ref 96–112)
Creatinine, Ser: 0.73 mg/dL (ref 0.40–1.50)
GFR: 86.42 mL/min (ref >60.00)
Glucose, Bld: 78 mg/dL (ref 70–99)
Potassium: 4.6 meq/L (ref 3.5–5.1)
Sodium: 140 meq/L (ref 135–145)
Total Bilirubin: 0.7 mg/dL (ref 0.2–1.2)
Total Protein: 6.6 g/dL (ref 6.0–8.3)

## 2023-07-25 LAB — BRAIN NATRIURETIC PEPTIDE: Pro B Natriuretic peptide (BNP): 12 pg/mL (ref 0.0–100.0)

## 2023-07-25 MED ORDER — DOXYCYCLINE HYCLATE 100 MG PO TABS: 100.0000 mg | ORAL_TABLET | Freq: Two times a day (BID) | ORAL | 0 refills | Status: DC

## 2023-07-25 MED ORDER — FUROSEMIDE 20 MG PO TABS: 20.0000 mg | ORAL_TABLET | Freq: Two times a day (BID) | ORAL | 0 refills | Status: DC

## 2023-07-25 MED ORDER — DOXYCYCLINE HYCLATE 100 MG PO TABS
100.0000 mg | ORAL_TABLET | Freq: Two times a day (BID) | ORAL | 0 refills | Status: DC
  Filled 2023-07-25: qty 14, 7d supply, fill #0

## 2023-07-25 NOTE — Telephone Encounter (Signed)
Pt said that he saw Alvester Eads wrote on the top of his paper to take Lasix 2x/day everyday. Pt said he is not on lasix. Advised patient Dr. Drue Novel had prescribed that medication in March. Pt is not familiar with this medication because he does not have a bottle of that medication anywhere. Please call patient to advise if Dr. Drue Novel wants him to continue or not.

## 2023-07-25 NOTE — Telephone Encounter (Signed)
Called pt, left a VM informing pt of the medication and correct dosage, advised pt to call the office back for any further questions or concerns.

## 2023-07-25 NOTE — Telephone Encounter (Signed)
FYI

## 2023-07-25 NOTE — Progress Notes (Signed)
Larry Elliott is a 78 y.o. male with the following history as recorded in Epic[employer]:  Patient Active Problem List   Diagnosis Date Noted   MCI (mild cognitive impairment) 05/05/2023   Chronic diastolic heart failure (HCC) 04/20/2023   Volume overload 01/02/2023   Primary localized osteoarthrosis of left hip 12/27/2022   Radiculopathy 12/04/2019   Low back pain 08/03/2018   PCP NOTES >>>>> 09/06/2015   LLQ abdominal pain 07/16/2014   Anxiety and depression 11/04/2013   Pain in joint, ankle and foot 12/14/2012   Annual physical exam 03/11/2011   DJD (degenerative joint disease) 03/17/2010   MELANOMA, FACE 11/20/2007   History of malignant neoplasm of prostate 08/05/2007   Hyperlipidemia 02/21/2007   Essential hypertension 02/21/2007   DIVERTICULOSIS, COLON 02/21/2007   Insomnia 02/21/2007    Current Outpatient Medications  Medication Sig Dispense Refill   atorvastatin (LIPITOR) 40 MG tablet Take 1 tablet (40 mg total) by mouth at bedtime. 90 tablet 1   docusate sodium (COLACE) 100 MG capsule Take 1 capsule (100 mg total) by mouth 2 (two) times daily as needed for mild constipation. 10 capsule 0   donepezil (ARICEPT) 5 MG tablet Take 1 tablet (5 mg total) by mouth at bedtime. 30 tablet 2   fenofibrate micronized (LOFIBRA) 134 MG capsule Take 1 capsule (134 mg total) by mouth daily before breakfast. 90 capsule 1   furosemide (LASIX) 40 MG tablet Take 1 tablet (40 mg total) by mouth 2 (two) times daily. 60 tablet 0   gabapentin (NEURONTIN) 100 MG capsule Take 1 capsule (100 mg total) by mouth 3 (three) times daily. 90 capsule 3   melatonin 5 MG TABS Take 1 tablet (5 mg total) by mouth at bedtime. 30 tablet 0   ondansetron (ZOFRAN) 4 MG tablet TAKE 1 TABLET BY MOUTH EVERY 8 HOURS AS NEEDED FOR NAUSEA OR VOMITING 20 tablet 0   oxyCODONE-acetaminophen (PERCOCET) 5-325 MG tablet Take 1 tablet by mouth every 6 (six) hours as needed for moderate pain or severe pain (post op pain). 20 tablet  0   potassium chloride SA (KLOR-CON M) 20 MEQ tablet Take 1 tablet (20 mEq total) by mouth 2 (two) times daily. 180 tablet 1   sertraline (ZOLOFT) 50 MG tablet Take 1 tablet (50 mg total) by mouth daily. 30 tablet 3   doxycycline (VIBRA-TABS) 100 MG tablet Take 1 tablet (100 mg total) by mouth 2 (two) times daily. 14 tablet 0   No current facility-administered medications for this visit.    Allergies: Ambien [zolpidem], Aspirin, Hydrocodone, and Penicillins  Past Medical History:  Diagnosis Date   Allergy    penicillin & Aspirin   Anxiety 11/04/2013   Arthritis    BCC (basal cell carcinoma), leg    Right calf, L nape of neck at hairline   CAD (coronary artery disease)    CP, cath, non-obstructive   Cataract    been removed   Depression 11/04/2013   Diverticulosis    colon   Erectile dysfunction following radical prostatectomy    Hyperlipidemia    Hypertension    Insomnia    Melanoma (HCC) 2010   face, sees derm    Prostate CA (HCC) 07/2007   s/p robotic surgery   Spermatocele    Left    Past Surgical History:  Procedure Laterality Date   APPENDECTOMY     age 85   EYE SURGERY     cataracts removed   HERNIA REPAIR  2010  JOINT REPLACEMENT  hip 2024   KNEE ARTHROSCOPY Left 1965   meniscal tear   PROSTATECTOMY  07/25/2007    robotic,  for prostate cancer     SHOULDER ARTHROSCOPY Left 07/09/2013   Dr Ave Filter    SPINE SURGERY     TOTAL HIP ARTHROPLASTY Left 12/27/2022   Procedure: LEFT TOTAL HIP ARTHROPLASTY ANTERIOR APPROACH;  Surgeon: Marcene Corning, MD;  Location: [facility];  Service: Orthopedics;  Laterality: Left;   TRANSFORAMINAL LUMBAR INTERBODY FUSION (TLIF) WITH PEDICLE SCREW FIXATION 1 LEVEL Right 12/04/2019   Procedure: RIGHT-SIDED TRANSFORAMINAL LUMBAR INTERBODY FUSION LUMBAR FOUR THROUGH FIVE WITH INSTRUMENTATION AND ALLOGRAFT;  Surgeon: Estill Bamberg, MD;  Location: [facility];  Service: Orthopedics;  Laterality: Right;    Family History  Problem Relation Age  of Onset   Coronary artery disease Father 20       dx age 58s   Stroke Mother    Hypertension Neg Hx    Diabetes Neg Hx    Colon cancer Neg Hx    Prostate cancer Neg Hx     Social History   Tobacco Use   Smoking status: Never   Smokeless tobacco: Never  Substance Use Topics   Alcohol use: Not Currently    Alcohol/week: 1.0 standard drink of alcohol    Types: 1 Glasses of wine per week    Comment: fruit glass size    Subjective:   Accompanied by wife; difficult historian/ forgot hearing aids today as well; in reviewing notes, swelling in lower extremities has been chronic issue for patient and at OV in June for similar complaint, question was raised if patient had actually been taking his Lasix; when asked again today, he appears confused and notes he actually doesn't know if he is taking his Lasix; his wife is unable to assist with clarification of this question;  Apparently, swelling, redness and pain have been present for "months"- unable to tell me if anything definitively changed that caused him to call for office visit today;   Objective:  Vitals:   07/25/23 1308  BP: (!) 148/82  Pulse: 92  Resp: 20  SpO2: 96%  Weight: 253 lb 12.8 oz (115.1 kg)  Height: 5\' 10"  (1.778 m)    General: Well developed, well nourished, in no acute distress  Skin : Warm and dry.  Head: Normocephalic and atraumatic  Eyes: Sclera and conjunctiva clear; pupils round and reactive to light; extraocular movements intact  Ears: External normal; canals clear; tympanic membranes normal  Oropharynx: Pink, supple. No suspicious lesions  Neck: Supple without thyromegaly, adenopathy  Lungs: Respirations unlabored; clear to auscultation bilaterally without wheeze, rales, rhonchi  CVS exam: normal rate and regular rhythm.  Extremities: bilateral pitting edema and erythema; concerning for infection on medial right leg Vessels: Symmetric bilaterally  Neurologic: Alert and oriented; speech intact; face  symmetrical; moves all extremities well; CNII-XII intact without focal deficit   Assessment:  1. Pedal edema   2. Chronic diastolic heart failure (HCC)   3. Cellulitis, unspecified cellulitis site     Plan:  Patient cannot confirm if he is taking his Lasix- am hesitant to try changing to Torsemide at this time; stressed repeatedly that he needs to take his Lasix twice a day as prescribed; will  Return in about 1 week (around 08/01/2023) for follow up Dr. Drue Novel.  Orders Placed This Encounter  Procedures   DG Chest 2 View    Standing Status:   Future    Standing Expiration Date:  07/24/2024    Order Specific Question:   Reason for Exam (SYMPTOM  OR DIAGNOSIS REQUIRED)    Answer:   pedal edema    Order Specific Question:   Preferred imaging location?    Answer:   [facility]   CBC with Differential/Platelet   Comp Met (CMET)   B Nat Peptide    Requested Prescriptions   Signed Prescriptions Disp Refills   doxycycline (VIBRA-TABS) 100 MG tablet 14 tablet 0    Sig: Take 1 tablet (100 mg total) by mouth 2 (two) times daily.

## 2023-07-25 NOTE — Progress Notes (Signed)
Larry Elliott is a 77 y.o. male with the following history as recorded in EpicCare:  Patient Active Problem List   Diagnosis Date Noted   MCI (mild cognitive impairment) 05/05/2023   Chronic diastolic heart failure (HCC) 04/20/2023   Volume overload 01/02/2023   Primary localized osteoarthrosis of left hip 12/27/2022   Radiculopathy 12/04/2019   Low back pain 08/03/2018   PCP NOTES >>>>> 09/06/2015   LLQ abdominal pain 07/16/2014   Anxiety and depression 11/04/2013   Pain in joint, ankle and foot 12/14/2012   Annual physical exam 03/11/2011   DJD (degenerative joint disease) 03/17/2010   MELANOMA, FACE 11/20/2007   History of malignant neoplasm of prostate 08/05/2007   Hyperlipidemia 02/21/2007   Essential hypertension 02/21/2007   DIVERTICULOSIS, COLON 02/21/2007   Insomnia 02/21/2007    Current Outpatient Medications  Medication Sig Dispense Refill   atorvastatin (LIPITOR) 40 MG tablet Take 1 tablet (40 mg total) by mouth at bedtime. 90 tablet 1   docusate sodium (COLACE) 100 MG capsule Take 1 capsule (100 mg total) by mouth 2 (two) times daily as needed for mild constipation. 10 capsule 0   donepezil (ARICEPT) 5 MG tablet Take 1 tablet (5 mg total) by mouth at bedtime. 30 tablet 2   fenofibrate micronized (LOFIBRA) 134 MG capsule Take 1 capsule (134 mg total) by mouth daily before breakfast. 90 capsule 1   furosemide (LASIX) 40 MG tablet Take 1 tablet (40 mg total) by mouth 2 (two) times daily. 60 tablet 0   gabapentin (NEURONTIN) 100 MG capsule Take 1 capsule (100 mg total) by mouth 3 (three) times daily. 90 capsule 3   melatonin 5 MG TABS Take 1 tablet (5 mg total) by mouth at bedtime. 30 tablet 0   ondansetron (ZOFRAN) 4 MG tablet TAKE 1 TABLET BY MOUTH EVERY 8 HOURS AS NEEDED FOR NAUSEA OR VOMITING 20 tablet 0   oxyCODONE-acetaminophen (PERCOCET) 5-325 MG tablet Take 1 tablet by mouth every 6 (six) hours as needed for moderate pain or severe pain (post op pain). 20 tablet  0   potassium chloride SA (KLOR-CON M) 20 MEQ tablet Take 1 tablet (20 mEq total) by mouth 2 (two) times daily. 180 tablet 1   sertraline (ZOLOFT) 50 MG tablet Take 1 tablet (50 mg total) by mouth daily. 30 tablet 3   doxycycline (VIBRA-TABS) 100 MG tablet Take 1 tablet (100 mg total) by mouth 2 (two) times daily. 14 tablet 0   No current facility-administered medications for this visit.    Allergies: Ambien [zolpidem], Aspirin, Hydrocodone, and Penicillins  Past Medical History:  Diagnosis Date   Allergy    penicillin & Aspirin   Anxiety 11/04/2013   Arthritis    BCC (basal cell carcinoma), leg    Right calf, L nape of neck at hairline   CAD (coronary artery disease)    CP, cath, non-obstructive   Cataract    been removed   Depression 11/04/2013   Diverticulosis    colon   Erectile dysfunction following radical prostatectomy    Hyperlipidemia    Hypertension    Insomnia    Melanoma (HCC) 2010   face, sees derm    Prostate CA (HCC) 07/2007   s/p robotic surgery   Spermatocele    Left    Past Surgical History:  Procedure Laterality Date   APPENDECTOMY     age 89   EYE SURGERY     cataracts removed   HERNIA REPAIR  2010  JOINT REPLACEMENT  hip 2024   KNEE ARTHROSCOPY Left 1965   meniscal tear   PROSTATECTOMY  07/25/2007    robotic,  for prostate cancer     SHOULDER ARTHROSCOPY Left 07/09/2013   Dr Ave Filter    SPINE SURGERY     TOTAL HIP ARTHROPLASTY Left 12/27/2022   Procedure: LEFT TOTAL HIP ARTHROPLASTY ANTERIOR APPROACH;  Surgeon: Marcene Corning, MD;  Location: [facility];  Service: Orthopedics;  Laterality: Left;   TRANSFORAMINAL LUMBAR INTERBODY FUSION (TLIF) WITH PEDICLE SCREW FIXATION 1 LEVEL Right 12/04/2019   Procedure: RIGHT-SIDED TRANSFORAMINAL LUMBAR INTERBODY FUSION LUMBAR FOUR THROUGH FIVE WITH INSTRUMENTATION AND ALLOGRAFT;  Surgeon: Estill Bamberg, MD;  Location: [facility];  Service: Orthopedics;  Laterality: Right;    Family History  Problem Relation Age  of Onset   Coronary artery disease Father 52       dx age 80s   Stroke Mother    Hypertension Neg Hx    Diabetes Neg Hx    Colon cancer Neg Hx    Prostate cancer Neg Hx     Social History   Tobacco Use   Smoking status: Never   Smokeless tobacco: Never  Substance Use Topics   Alcohol use: Not Currently    Alcohol/week: 1.0 standard drink of alcohol    Types: 1 Glasses of wine per week    Comment: fruit glass size    Subjective:   Accompanied by wife; difficult historian/ forgot hearing aids today as well; in reviewing notes, swelling in lower extremities has been chronic issue for patient and at OV in June for similar complaint, question was raised if patient had actually been taking his Lasix; when asked again today, he appears confused and notes he actually doesn't know if he is taking his Lasix; his wife is unable to assist with clarification of this question;  Per patient, the swelling, redness and pain have been present for "months"- unable to tell me if anything definitively changed that caused him to call for office visit today;    Objective:     Vitals:    07/25/23 1308  BP: (!) 148/82  Pulse: 92  Resp: 20  SpO2: 96%  Weight: 253 lb 12.8 oz (115.1 kg)  Height: 5\' 10"  (1.778 m)    General: Well developed, well nourished, in no acute distress  Skin : Warm and dry.  Head: Normocephalic and atraumatic  Eyes: Sclera and conjunctiva clear; pupils round and reactive to light; extraocular movements intact  Ears: External normal; canals clear; tympanic membranes normal  Oropharynx: Pink, supple. No suspicious lesions  Neck: Supple without thyromegaly, adenopathy  Lungs: Respirations unlabored; clear to auscultation bilaterally without wheeze, rales, rhonchi  CVS exam: normal rate and regular rhythm.  Extremities: bilateral pitting edema and erythema; concerning for infection on medial right leg Vessels: Symmetric bilaterally  Neurologic: Alert and oriented; speech  intact; face symmetrical; moves all extremities well; CNII-XII intact without focal deficit    Assessment:  1. Pedal edema   2. Chronic diastolic heart failure (HCC)   3. Cellulitis, unspecified cellulitis site     Plan:  Patient cannot confirm if he is taking his Lasix- am hesitant to try changing to Torsemide at this time; stressed repeatedly that he needs to take his Lasix twice a day as prescribed; will update labs including CBC, CMP, d-dimer today; will also update CXR; Rx for Doxycycline 100 mg bid x 7 days;    Return in about 1 week (around 08/01/2023) for follow up  Dr. Drue Novel.      Plan:    Return in about 1 week (around 08/01/2023) for follow up Dr. Drue Novel.  Orders Placed This Encounter  Procedures   DG Chest 2 View    Standing Status:   Future    Number of Occurrences:   1    Standing Expiration Date:   07/24/2024    Order Specific Question:   Reason for Exam (SYMPTOM  OR DIAGNOSIS REQUIRED)    Answer:   pedal edema    Order Specific Question:   Preferred imaging location?    Answer:   [facility]   CBC with Differential/Platelet   Comp Met (CMET)   B Nat Peptide    Requested Prescriptions   Signed Prescriptions Disp Refills   doxycycline (VIBRA-TABS) 100 MG tablet 14 tablet 0    Sig: Take 1 tablet (100 mg total) by mouth 2 (two) times daily.

## 2023-08-02 ENCOUNTER — Encounter: Payer: Self-pay | Admitting: Internal Medicine

## 2023-08-02 DIAGNOSIS — R600 Localized edema: Secondary | ICD-10-CM

## 2023-08-03 NOTE — Telephone Encounter (Signed)
Referral placed.

## 2023-08-03 NOTE — Addendum Note (Signed)
Addended by: Conrad Almira D on: 08/03/2023 01:02 PM   Modules accepted: Orders

## 2023-08-03 NOTE — Telephone Encounter (Signed)
Arrange a referral to PT, see my comments.

## 2023-08-08 ENCOUNTER — Encounter: Payer: Self-pay | Admitting: Internal Medicine

## 2023-08-08 ENCOUNTER — Ambulatory Visit: Payer: Medicare Other | Admitting: Internal Medicine

## 2023-08-08 VITALS — BP 136/68 | HR 85 | Temp 98.0°F | Resp 22 | Ht 70.0 in | Wt 250.4 lb

## 2023-08-08 DIAGNOSIS — R600 Localized edema: Secondary | ICD-10-CM | POA: Diagnosis not present

## 2023-08-08 DIAGNOSIS — L0390 Cellulitis, unspecified: Secondary | ICD-10-CM | POA: Diagnosis not present

## 2023-08-08 DIAGNOSIS — G3184 Mild cognitive impairment, so stated: Secondary | ICD-10-CM

## 2023-08-08 NOTE — Progress Notes (Signed)
Subjective:    Patient ID: Larry Elliott, male    DOB: 02/21/1946, 77 y.o.   MRN: [id number]  DOS:  08/08/2023 Type of visit - description: Follow-up  Seen 07/25/2023 by another provider, due to LE edema and possibly cellulitis. CMP normal, BMP normal, CBC with no elevated white count.  Was Rx doxycycline.  He comes today for a follow-up by himself, he does not know what medications he is taking. Denies chest pain or difficulty breathing. To him,  edema is about the same.   Wt Readings from Last 3 Encounters:  08/08/23 250 lb 6 oz (113.6 kg)  07/25/23 253 lb 12.8 oz (115.1 kg)  05/03/23 246 lb 2 oz (111.6 kg)    Review of Systems See above   Past Medical History:  Diagnosis Date   Allergy    penicillin & Aspirin   Anxiety 11/04/2013   Arthritis    BCC (basal cell carcinoma), leg    Right calf, L nape of neck at hairline   CAD (coronary artery disease)    CP, cath, non-obstructive   Cataract    been removed   Depression 11/04/2013   Diverticulosis    colon   Erectile dysfunction following radical prostatectomy    Hyperlipidemia    Hypertension    Insomnia    Melanoma (HCC) 2010   face, sees derm    Prostate CA (HCC) 07/2007   s/p robotic surgery   Spermatocele    Left    Past Surgical History:  Procedure Laterality Date   APPENDECTOMY     age 6   EYE SURGERY     cataracts removed   HERNIA REPAIR  2010   JOINT REPLACEMENT  hip 2024   KNEE ARTHROSCOPY Left 1965   meniscal tear   PROSTATECTOMY  07/25/2007    robotic,  for prostate cancer     SHOULDER ARTHROSCOPY Left 07/09/2013   Dr Ave Filter    SPINE SURGERY     TOTAL HIP ARTHROPLASTY Left 12/27/2022   Procedure: LEFT TOTAL HIP ARTHROPLASTY ANTERIOR APPROACH;  Surgeon: Marcene Corning, MD;  Location: [facility];  Service: Orthopedics;  Laterality: Left;   TRANSFORAMINAL LUMBAR INTERBODY FUSION (TLIF) WITH PEDICLE SCREW FIXATION 1 LEVEL Right 12/04/2019   Procedure: RIGHT-SIDED TRANSFORAMINAL LUMBAR  INTERBODY FUSION LUMBAR FOUR THROUGH FIVE WITH INSTRUMENTATION AND ALLOGRAFT;  Surgeon: Estill Bamberg, MD;  Location: [facility];  Service: Orthopedics;  Laterality: Right;    Current Outpatient Medications  Medication Instructions   atorvastatin (LIPITOR) 40 mg, Oral, Daily at bedtime   docusate sodium (COLACE) 100 mg, Oral, 2 times daily PRN   donepezil (ARICEPT) 5 mg, Oral, Daily at bedtime   doxycycline (VIBRA-TABS) 100 mg, Oral, 2 times daily   fenofibrate micronized (LOFIBRA) 134 mg, Oral, Daily before breakfast   furosemide (LASIX) 20 mg, Oral, 2 times daily   gabapentin (NEURONTIN) 100 mg, Oral, 3 times daily   melatonin 5 mg, Oral, Daily at bedtime   ondansetron (ZOFRAN) 4 mg, Oral, Every 8 hours PRN   oxyCODONE-acetaminophen (PERCOCET) 5-325 MG tablet 1 tablet, Oral, Every 6 hours PRN   potassium chloride SA (KLOR-CON M) 20 MEQ tablet 20 mEq, Oral, 2 times daily   sertraline (ZOLOFT) 50 mg, Oral, Daily       Objective:   Physical Exam BP (!) 144/74   Pulse 85   Temp 98 F (36.7 C) (Oral)   Resp (!) 22   Ht 5\' 10"  (1.778 m)   Wt 250 lb 6  oz (113.6 kg)   SpO2 95%   BMI 35.93 kg/m  General:   Well developed, NAD, BMI noted. HEENT:  Normocephalic . Face symmetric, atraumatic Lungs:  CTA B Normal respiratory effort, no intercostal retractions, no accessory muscle use. Heart: RRR,  no murmur.  Lower extremities: Edema present, see pictures, no evidence of acute infection (no openings, no discharge, no warmness).      Skin: Not pale. Not jaundice Neurologic:  alert & oriented X3.  Speech normal, gait appropriate for age and unassisted Psych--  Seems in good spirits No anxious or depressed appearing.      Assessment     Assessment  Hyperglycemia   HTN Hyperlipidemia Anxiety depression insomnia   dc clonazepam - not effective,  8-16, rx trazodone. Metabolic encephalopathy admitted July 2023: No more Ambien  CV: --CAD, non-obstructive ---LE edema R>L (-)  US DVT 2020 ---ABI 09/2019 WNL -- Diastolic CHF suspected, echo 02/2023 normal Prostate cancer-- 2008, robotic surgery, no incontinence, Dr Laverle Patter: now f/u per PCP as of 05-2018 [facility] ED past surgery  spermatocele, left Skin --Melanoma 2010, BCCs  does not see derm regularly as off 01-2022   PLAN Here for follow-up, by himself. Compliance: See previous entry, patient does not know what medication he takes. Lower extremity edema: Recently seen, there was a question of cellulitis, was Rx antibiotics.  On today's exam I do not see any active infection, his edema seems at baseline or perhaps a little better.  Weight is down 3 pounds. Family requested a PT referral for edema management, we are working on it but is proving difficult to find the service as an outpatient. Will refer to a home health agency. RTC 3 months.

## 2023-08-08 NOTE — Patient Instructions (Addendum)
You still have edema and we are working on referring you to a physical therapy to help you with that.  Remember to keep your legs elevated at least 1 hour in the morning and 1 hour in the afternoon  Continue same medications as you are doing.  Please ask your family to help you comply with your medicines.  If you have fever chills or worsening edema: Let us know.  Next visit in 3 months, please arrange

## 2023-08-08 NOTE — Assessment & Plan Note (Signed)
Here for follow-up, by himself. Compliance: See previous entry, patient does not know what medication he takes. Lower extremity edema: Recently seen, there was a question of cellulitis, was Rx antibiotics.  On today's exam I do not see any active infection, his edema seems at baseline or perhaps a little better.  Weight is down 3 pounds. Family requested a PT referral for edema management, we are working on it but is proving difficult to find the service as an outpatient. Will refer to a home health agency. RTC 3 months.

## 2023-08-14 DIAGNOSIS — Z96642 Presence of left artificial hip joint: Secondary | ICD-10-CM | POA: Diagnosis not present

## 2023-08-14 DIAGNOSIS — Z96652 Presence of left artificial knee joint: Secondary | ICD-10-CM | POA: Diagnosis not present

## 2023-08-14 DIAGNOSIS — I509 Heart failure, unspecified: Secondary | ICD-10-CM | POA: Diagnosis not present

## 2023-08-14 DIAGNOSIS — M1990 Unspecified osteoarthritis, unspecified site: Secondary | ICD-10-CM | POA: Diagnosis not present

## 2023-08-14 DIAGNOSIS — E7800 Pure hypercholesterolemia, unspecified: Secondary | ICD-10-CM | POA: Diagnosis not present

## 2023-08-14 DIAGNOSIS — I2510 Atherosclerotic heart disease of native coronary artery without angina pectoris: Secondary | ICD-10-CM | POA: Diagnosis not present

## 2023-08-14 DIAGNOSIS — Z7984 Long term (current) use of oral hypoglycemic drugs: Secondary | ICD-10-CM | POA: Diagnosis not present

## 2023-08-14 DIAGNOSIS — N5231 Erectile dysfunction following radical prostatectomy: Secondary | ICD-10-CM | POA: Diagnosis not present

## 2023-08-14 DIAGNOSIS — F419 Anxiety disorder, unspecified: Secondary | ICD-10-CM | POA: Diagnosis not present

## 2023-08-14 DIAGNOSIS — G4700 Insomnia, unspecified: Secondary | ICD-10-CM | POA: Diagnosis not present

## 2023-08-14 DIAGNOSIS — K5730 Diverticulosis of large intestine without perforation or abscess without bleeding: Secondary | ICD-10-CM | POA: Diagnosis not present

## 2023-08-14 DIAGNOSIS — Z85828 Personal history of other malignant neoplasm of skin: Secondary | ICD-10-CM | POA: Diagnosis not present

## 2023-08-14 DIAGNOSIS — Z8546 Personal history of malignant neoplasm of prostate: Secondary | ICD-10-CM | POA: Diagnosis not present

## 2023-08-14 DIAGNOSIS — R739 Hyperglycemia, unspecified: Secondary | ICD-10-CM | POA: Diagnosis not present

## 2023-08-14 DIAGNOSIS — Z96612 Presence of left artificial shoulder joint: Secondary | ICD-10-CM | POA: Diagnosis not present

## 2023-08-14 DIAGNOSIS — F32A Depression, unspecified: Secondary | ICD-10-CM | POA: Diagnosis not present

## 2023-08-14 DIAGNOSIS — I110 Hypertensive heart disease with heart failure: Secondary | ICD-10-CM | POA: Diagnosis not present

## 2023-08-14 DIAGNOSIS — Z9049 Acquired absence of other specified parts of digestive tract: Secondary | ICD-10-CM | POA: Diagnosis not present

## 2023-08-14 DIAGNOSIS — G3184 Mild cognitive impairment, so stated: Secondary | ICD-10-CM | POA: Diagnosis not present

## 2023-08-15 ENCOUNTER — Telehealth: Payer: Self-pay | Admitting: Internal Medicine

## 2023-08-15 NOTE — Telephone Encounter (Signed)
LMOM informing Larry Elliott of PCP recommendations. Verbal orders given for nursing and speech therapy.

## 2023-08-15 NOTE — Telephone Encounter (Signed)
Caller/Agency: [facility]  Callback Number: [phone]  Requesting OT/PT/Skilled Nursing/Social Work/Speech Therapy: nursing Frequency: 2x for 2 weeks 1x for 4 weeks   Also wanted state he needs an order for speech therapy eval. Also wanted to mention pt's leg edema is at a 3+ and is SOB w/ exertion.

## 2023-08-15 NOTE — Telephone Encounter (Signed)
FYI. Please advise.

## 2023-08-15 NOTE — Telephone Encounter (Signed)
Okay to provide orders. Also, tell patient for swelling and difficulty breathing: If he feels  is slightly worse, okay to take a extra tablet of Lasix for the next 3 days. If he is not improving needs to be seen. If severe symptoms: ER.

## 2023-08-17 ENCOUNTER — Encounter: Payer: Self-pay | Admitting: Internal Medicine

## 2023-08-17 ENCOUNTER — Inpatient Hospital Stay (HOSPITAL_COMMUNITY)
Admission: EM | Admit: 2023-08-17 | Discharge: 2023-08-20 | DRG: 291 | Disposition: A | Discharge status: Home Health | Payer: Medicare Other | Attending: Internal Medicine | Admitting: Internal Medicine

## 2023-08-17 ENCOUNTER — Encounter (HOSPITAL_COMMUNITY): Payer: Self-pay

## 2023-08-17 ENCOUNTER — Emergency Department (HOSPITAL_COMMUNITY): Payer: Medicare Other

## 2023-08-17 ENCOUNTER — Other Ambulatory Visit: Payer: Self-pay

## 2023-08-17 DIAGNOSIS — E876 Hypokalemia: Secondary | ICD-10-CM | POA: Diagnosis not present

## 2023-08-17 DIAGNOSIS — F32A Depression, unspecified: Secondary | ICD-10-CM | POA: Diagnosis not present

## 2023-08-17 DIAGNOSIS — I10 Essential (primary) hypertension: Secondary | ICD-10-CM | POA: Diagnosis not present

## 2023-08-17 DIAGNOSIS — I161 Hypertensive emergency: Secondary | ICD-10-CM

## 2023-08-17 DIAGNOSIS — Z823 Family history of stroke: Secondary | ICD-10-CM

## 2023-08-17 DIAGNOSIS — Z79899 Other long term (current) drug therapy: Secondary | ICD-10-CM | POA: Diagnosis not present

## 2023-08-17 DIAGNOSIS — Z888 Allergy status to other drugs, medicaments and biological substances status: Secondary | ICD-10-CM | POA: Diagnosis not present

## 2023-08-17 DIAGNOSIS — F0393 Unspecified dementia, unspecified severity, with mood disturbance: Secondary | ICD-10-CM | POA: Diagnosis not present

## 2023-08-17 DIAGNOSIS — Z79890 Hormone replacement therapy (postmenopausal): Secondary | ICD-10-CM

## 2023-08-17 DIAGNOSIS — Z66 Do not resuscitate: Secondary | ICD-10-CM | POA: Diagnosis present

## 2023-08-17 DIAGNOSIS — E669 Obesity, unspecified: Secondary | ICD-10-CM | POA: Diagnosis not present

## 2023-08-17 DIAGNOSIS — R0602 Shortness of breath: Secondary | ICD-10-CM | POA: Diagnosis not present

## 2023-08-17 DIAGNOSIS — F0394 Unspecified dementia, unspecified severity, with anxiety: Secondary | ICD-10-CM | POA: Diagnosis present

## 2023-08-17 DIAGNOSIS — R4189 Other symptoms and signs involving cognitive functions and awareness: Secondary | ICD-10-CM | POA: Diagnosis not present

## 2023-08-17 DIAGNOSIS — Z880 Allergy status to penicillin: Secondary | ICD-10-CM | POA: Diagnosis not present

## 2023-08-17 DIAGNOSIS — R457 State of emotional shock and stress, unspecified: Secondary | ICD-10-CM | POA: Diagnosis not present

## 2023-08-17 DIAGNOSIS — Z8546 Personal history of malignant neoplasm of prostate: Secondary | ICD-10-CM

## 2023-08-17 DIAGNOSIS — F419 Anxiety disorder, unspecified: Secondary | ICD-10-CM | POA: Diagnosis present

## 2023-08-17 DIAGNOSIS — Z6835 Body mass index (BMI) 35.0-35.9, adult: Secondary | ICD-10-CM

## 2023-08-17 DIAGNOSIS — Z8249 Family history of ischemic heart disease and other diseases of the circulatory system: Secondary | ICD-10-CM | POA: Diagnosis not present

## 2023-08-17 DIAGNOSIS — Z85828 Personal history of other malignant neoplasm of skin: Secondary | ICD-10-CM | POA: Diagnosis not present

## 2023-08-17 DIAGNOSIS — I2510 Atherosclerotic heart disease of native coronary artery without angina pectoris: Secondary | ICD-10-CM | POA: Diagnosis not present

## 2023-08-17 DIAGNOSIS — Z886 Allergy status to analgesic agent status: Secondary | ICD-10-CM | POA: Diagnosis not present

## 2023-08-17 DIAGNOSIS — R0682 Tachypnea, not elsewhere classified: Secondary | ICD-10-CM | POA: Diagnosis not present

## 2023-08-17 DIAGNOSIS — R0609 Other forms of dyspnea: Secondary | ICD-10-CM | POA: Diagnosis not present

## 2023-08-17 DIAGNOSIS — I5033 Acute on chronic diastolic (congestive) heart failure: Secondary | ICD-10-CM | POA: Diagnosis not present

## 2023-08-17 DIAGNOSIS — I110 Hypertensive heart disease with heart failure: Principal | ICD-10-CM | POA: Diagnosis present

## 2023-08-17 DIAGNOSIS — G3184 Mild cognitive impairment, so stated: Secondary | ICD-10-CM | POA: Diagnosis present

## 2023-08-17 DIAGNOSIS — R0689 Other abnormalities of breathing: Secondary | ICD-10-CM | POA: Diagnosis not present

## 2023-08-17 DIAGNOSIS — Z96642 Presence of left artificial hip joint: Secondary | ICD-10-CM | POA: Diagnosis not present

## 2023-08-17 DIAGNOSIS — R0989 Other specified symptoms and signs involving the circulatory and respiratory systems: Secondary | ICD-10-CM | POA: Diagnosis not present

## 2023-08-17 DIAGNOSIS — E785 Hyperlipidemia, unspecified: Secondary | ICD-10-CM | POA: Diagnosis not present

## 2023-08-17 DIAGNOSIS — Z7984 Long term (current) use of oral hypoglycemic drugs: Secondary | ICD-10-CM

## 2023-08-17 DIAGNOSIS — I517 Cardiomegaly: Secondary | ICD-10-CM | POA: Diagnosis not present

## 2023-08-17 DIAGNOSIS — F0390 Unspecified dementia without behavioral disturbance: Secondary | ICD-10-CM | POA: Diagnosis present

## 2023-08-17 DIAGNOSIS — J9601 Acute respiratory failure with hypoxia: Secondary | ICD-10-CM | POA: Diagnosis not present

## 2023-08-17 DIAGNOSIS — Z85820 Personal history of malignant melanoma of skin: Secondary | ICD-10-CM | POA: Diagnosis not present

## 2023-08-17 DIAGNOSIS — I890 Lymphedema, not elsewhere classified: Secondary | ICD-10-CM | POA: Diagnosis present

## 2023-08-17 DIAGNOSIS — R609 Edema, unspecified: Secondary | ICD-10-CM | POA: Diagnosis present

## 2023-08-17 DIAGNOSIS — R739 Hyperglycemia, unspecified: Secondary | ICD-10-CM | POA: Diagnosis present

## 2023-08-17 LAB — CBC WITH DIFFERENTIAL/PLATELET
Abs Immature Granulocytes: 0.09 10*3/uL — ABNORMAL HIGH (ref 0.00–0.07)
Basophils Absolute: 0.1 10*3/uL (ref 0.0–0.1)
Basophils Relative: 1 %
Eosinophils Absolute: 0.2 10*3/uL (ref 0.0–0.5)
Eosinophils Relative: 2 %
HCT: 44.4 % (ref 39.0–52.0)
Hemoglobin: 13.7 g/dL (ref 13.0–17.0)
Immature Granulocytes: 1 %
Lymphocytes Relative: 10 %
Lymphs Abs: 1 10*3/uL (ref 0.7–4.0)
MCH: 26.9 pg (ref 26.0–34.0)
MCHC: 30.9 g/dL (ref 30.0–36.0)
MCV: 87.2 fL (ref 80.0–100.0)
Monocytes Absolute: 0.9 10*3/uL (ref 0.1–1.0)
Monocytes Relative: 9 %
Neutro Abs: 8.2 10*3/uL — ABNORMAL HIGH (ref 1.7–7.7)
Neutrophils Relative %: 77 %
Platelets: 362 10*3/uL (ref 150–400)
RBC: 5.09 MIL/uL (ref 4.22–5.81)
RDW: 14.9 % (ref 11.5–15.5)
WBC: 10.5 10*3/uL (ref 4.0–10.5)
nRBC: 0 % (ref 0.0–0.2)

## 2023-08-17 LAB — TROPONIN I (HIGH SENSITIVITY)
Troponin I (High Sensitivity): 4 ng/L (ref <18)
Troponin I (High Sensitivity): 5 ng/L (ref <18)

## 2023-08-17 LAB — BASIC METABOLIC PANEL
Anion gap: 12 (ref 5–15)
BUN: 7 mg/dL — ABNORMAL LOW (ref 8–23)
CO2: 21 mmol/L — ABNORMAL LOW (ref 22–32)
Calcium: 8.6 mg/dL — ABNORMAL LOW (ref 8.9–10.3)
Chloride: 105 mmol/L (ref 98–111)
Creatinine, Ser: 0.8 mg/dL (ref 0.61–1.24)
GFR, Estimated: >60 mL/min (ref >60)
Glucose, Bld: 105 mg/dL — ABNORMAL HIGH (ref 70–99)
Potassium: 3.6 mmol/L (ref 3.5–5.1)
Sodium: 138 mmol/L (ref 135–145)

## 2023-08-17 LAB — I-STAT CHEM 8, ED
BUN: 7 mg/dL — ABNORMAL LOW (ref 8–23)
Calcium, Ion: 0.99 mmol/L — ABNORMAL LOW (ref 1.15–1.40)
Chloride: 107 mmol/L (ref 98–111)
Creatinine, Ser: 0.7 mg/dL (ref 0.61–1.24)
Glucose, Bld: 104 mg/dL — ABNORMAL HIGH (ref 70–99)
HCT: 42 % (ref 39.0–52.0)
Hemoglobin: 14.3 g/dL (ref 13.0–17.0)
Potassium: 3.5 mmol/L (ref 3.5–5.1)
Sodium: 140 mmol/L (ref 135–145)
TCO2: 20 mmol/L — ABNORMAL LOW (ref 22–32)

## 2023-08-17 LAB — BRAIN NATRIURETIC PEPTIDE: B Natriuretic Peptide: 14.6 pg/mL (ref 0.0–100.0)

## 2023-08-17 MED ORDER — SERTRALINE HCL 50 MG PO TABS
50.0000 mg | ORAL_TABLET | Freq: Every day | ORAL | Status: DC
  Administered 2023-08-18 – 2023-08-20 (×3): 50 mg via ORAL
  Filled 2023-08-17 (×3): qty 1

## 2023-08-17 MED ORDER — DOCUSATE SODIUM 100 MG PO CAPS
100.0000 mg | ORAL_CAPSULE | Freq: Two times a day (BID) | ORAL | Status: DC
  Administered 2023-08-17 – 2023-08-20 (×6): 100 mg via ORAL
  Filled 2023-08-17 (×6): qty 1

## 2023-08-17 MED ORDER — GABAPENTIN 100 MG PO CAPS
100.0000 mg | ORAL_CAPSULE | Freq: Three times a day (TID) | ORAL | Status: DC
  Administered 2023-08-17 – 2023-08-20 (×8): 100 mg via ORAL
  Filled 2023-08-17 (×8): qty 1

## 2023-08-17 MED ORDER — ATORVASTATIN CALCIUM 40 MG PO TABS
40.0000 mg | ORAL_TABLET | Freq: Every day | ORAL | Status: DC
  Administered 2023-08-17 – 2023-08-19 (×3): 40 mg via ORAL
  Filled 2023-08-17 (×3): qty 1

## 2023-08-17 MED ORDER — ISOSORBIDE MONONITRATE ER 30 MG PO TB24
30.0000 mg | ORAL_TABLET | Freq: Every day | ORAL | Status: DC
  Administered 2023-08-17: 30 mg via ORAL
  Filled 2023-08-17: qty 1

## 2023-08-17 MED ORDER — ENOXAPARIN SODIUM 60 MG/0.6ML IJ SOSY
50.0000 mg | PREFILLED_SYRINGE | INTRAMUSCULAR | Status: DC
Start: 2023-08-17 — End: 2023-08-20
  Administered 2023-08-18 – 2023-08-19 (×2): 50 mg via SUBCUTANEOUS
  Filled 2023-08-17 (×2): qty 0.6

## 2023-08-17 MED ORDER — ONDANSETRON HCL 4 MG/2ML IJ SOLN: 4.0000 mg | Freq: Four times a day (QID) | INTRAMUSCULAR | Status: DC | PRN

## 2023-08-17 MED ORDER — ACETAMINOPHEN 650 MG RE SUPP: 650.0000 mg | Freq: Four times a day (QID) | RECTAL | Status: DC | PRN

## 2023-08-17 MED ORDER — FUROSEMIDE 10 MG/ML IJ SOLN
40.0000 mg | Freq: Once | INTRAMUSCULAR | Status: AC
  Administered 2023-08-17: 40 mg via INTRAVENOUS
  Filled 2023-08-17: qty 4

## 2023-08-17 MED ORDER — POLYETHYLENE GLYCOL 3350 17 G PO PACK: 17.0000 g | PACK | Freq: Every day | ORAL | Status: DC | PRN

## 2023-08-17 MED ORDER — ONDANSETRON HCL 4 MG PO TABS
4.0000 mg | ORAL_TABLET | Freq: Four times a day (QID) | ORAL | Status: DC | PRN
Start: 2023-08-17 — End: 2023-08-20

## 2023-08-17 MED ORDER — ACETAMINOPHEN 325 MG PO TABS: 650.0000 mg | ORAL_TABLET | Freq: Four times a day (QID) | ORAL | Status: DC | PRN

## 2023-08-17 MED ORDER — DONEPEZIL HCL 5 MG PO TABS
5.0000 mg | ORAL_TABLET | Freq: Every day | ORAL | Status: DC
  Administered 2023-08-17 – 2023-08-19 (×3): 5 mg via ORAL
  Filled 2023-08-17 (×3): qty 1

## 2023-08-17 MED ORDER — LOSARTAN POTASSIUM 50 MG PO TABS: 50.0000 mg | ORAL_TABLET | Freq: Every day | ORAL | Status: DC

## 2023-08-17 MED ORDER — FUROSEMIDE 10 MG/ML IJ SOLN
40.0000 mg | Freq: Two times a day (BID) | INTRAMUSCULAR | Status: DC
  Administered 2023-08-17 – 2023-08-19 (×4): 40 mg via INTRAVENOUS
  Filled 2023-08-17 (×4): qty 4

## 2023-08-17 MED ORDER — FENOFIBRATE 160 MG PO TABS
160.0000 mg | ORAL_TABLET | Freq: Every day | ORAL | Status: DC
  Administered 2023-08-18 – 2023-08-20 (×3): 160 mg via ORAL
  Filled 2023-08-17 (×3): qty 1

## 2023-08-17 NOTE — ED Notes (Signed)
Pt keeps insisting on getting up to bedside commode. Says he has to have a BM and then will only pee. Have tried explaining to him it would be easier on his breathing if he would use the urinal. Pt states he isn't sure if he has to only pee so insists on getting up the bedside commode.

## 2023-08-17 NOTE — H&P (Signed)
[facility] History and Physical   Patient: Larry Elliott NWG:[id number] DOB: 28-Mar-1946 DOA: 08/17/2023 PCP: Wanda Plump, MD  DOS: the patient was seen and examined on 08/17/2023 Patient coming from: Home  Chief Complaint: Shortness of breath. ED TRIAGE note: Pt bib ems due to shob that started last night and got bad at 0700. Pt breathing 60/ min for EMS and was on 4L [place]. 200 systolic.    HPI: Larry Elliott is a 77 y.o. male with Past medical history of HTN, chronic lower extremity edema, HLD, obesity, CAD nonobstructive present to the hospital with complaints of sudden onset of shortness of breath.  Patient reports that he was at his baseline last night. Woke up this morning with surrounds her shortness of breath.  Denies any chest pain or chest tightness initially but then later reports that he actually started with chest tightness as well. Both symptoms currently resolved at the time of my evaluation. No nausea no vomiting no fever no chills. He has chronic swelling of his legs.  He is on oral Lasix at home.  He was being eval by lymphedema clinic. Patient was also treated with oral doxycycline for possible cellulitis recently in the beginning of this month. Patient mentions that he takes his blood pressure medications as scheduled and has not missed or skipped any medication. Does not check his blood pressure at home. Spends a lot of time during the day sleeping. No smoking or alcohol abuse reported. Does his medication and is very functional at his baseline but does have some cognitive imbalance.  Review of Systems: As mentioned in the history of present illness. All other systems reviewed and are negative.  Past Medical History:  Diagnosis Date   Allergy    penicillin & Aspirin   Anxiety 11/04/2013   Arthritis    BCC (basal cell carcinoma), leg    Right calf, L nape of neck at hairline   CAD (coronary artery disease)    CP, cath, non-obstructive   Cataract     been removed   Depression 11/04/2013   Diverticulosis    colon   Erectile dysfunction following radical prostatectomy    Hyperlipidemia    Hypertension    Insomnia    Melanoma (HCC) 2010   face, sees derm    Prostate CA (HCC) 07/2007   s/p robotic surgery   Spermatocele    Left   Past Surgical History:  Procedure Laterality Date   APPENDECTOMY     age 40   EYE SURGERY     cataracts removed   HERNIA REPAIR  2010   JOINT REPLACEMENT  hip 2024   KNEE ARTHROSCOPY Left 1965   meniscal tear   PROSTATECTOMY  07/25/2007    robotic,  for prostate cancer     SHOULDER ARTHROSCOPY Left 07/09/2013   Dr Ave Filter    SPINE SURGERY     TOTAL HIP ARTHROPLASTY Left 12/27/2022   Procedure: LEFT TOTAL HIP ARTHROPLASTY ANTERIOR APPROACH;  Surgeon: Marcene Corning, MD;  Location: [facility];  Service: Orthopedics;  Laterality: Left;   TRANSFORAMINAL LUMBAR INTERBODY FUSION (TLIF) WITH PEDICLE SCREW FIXATION 1 LEVEL Right 12/04/2019   Procedure: RIGHT-SIDED TRANSFORAMINAL LUMBAR INTERBODY FUSION LUMBAR FOUR THROUGH FIVE WITH INSTRUMENTATION AND ALLOGRAFT;  Surgeon: Estill Bamberg, MD;  Location: [facility];  Service: Orthopedics;  Laterality: Right;   Social History:  reports that he has never smoked. He has never used smokeless tobacco. He reports that he does not currently use alcohol after a  past usage of about 1.0 standard drink of alcohol per week. He reports that he does not use drugs.  Allergies  Allergen Reactions   Ambien [Zolpidem] Other (See Comments)    Encephalopathy, admitted 04-2022, symptoms suspected to be from [place]   Aspirin Swelling    swelling in face   Hydrocodone Other (See Comments)    Insomnia, mild SOB (w/o cough-wheezing)   Penicillins Swelling    swelling in face    Family history reviewed and not pertinent Family History  Problem Relation Age of Onset   Coronary artery disease Father 56       dx age 49s   Stroke Mother    Hypertension Neg Hx    Diabetes Neg Hx     Colon cancer Neg Hx    Prostate cancer Neg Hx     Prior to Admission medications   Medication Sig Start Date End Date Taking? Authorizing Provider  atorvastatin (LIPITOR) 40 MG tablet Take 1 tablet (40 mg total) by mouth at bedtime. 07/20/22   Wanda Plump, MD  docusate sodium (COLACE) 100 MG capsule Take 1 capsule (100 mg total) by mouth 2 (two) times daily as needed for mild constipation. 01/02/23   Rolly Salter, MD  donepezil (ARICEPT) 5 MG tablet Take 1 tablet (5 mg total) by mouth at bedtime. 05/08/23   Wanda Plump, MD  doxycycline (VIBRA-TABS) 100 MG tablet Take 1 tablet (100 mg total) by mouth 2 (two) times daily. 07/25/23   Olive Bass, FNP  fenofibrate micronized (LOFIBRA) 134 MG capsule Take 1 capsule (134 mg total) by mouth daily before breakfast. 09/29/22   Wanda Plump, MD  furosemide (LASIX) 20 MG tablet Take 1 tablet (20 mg total) by mouth 2 (two) times daily. 07/25/23   Olive Bass, FNP  gabapentin (NEURONTIN) 100 MG capsule Take 1 capsule (100 mg total) by mouth 3 (three) times daily. 02/28/23   Seabron Spates R, DO  melatonin 5 MG TABS Take 1 tablet (5 mg total) by mouth at bedtime. 01/02/23   Rolly Salter, MD  ondansetron (ZOFRAN) 4 MG tablet TAKE 1 TABLET BY MOUTH EVERY 8 HOURS AS NEEDED FOR NAUSEA OR VOMITING Patient not taking: Reported on 08/08/2023 03/09/23   Wanda Plump, MD  oxyCODONE-acetaminophen (PERCOCET) 5-325 MG tablet Take 1 tablet by mouth every 6 (six) hours as needed for moderate pain or severe pain (post op pain). 01/02/23   Rolly Salter, MD  potassium chloride SA (KLOR-CON M) 20 MEQ tablet Take 1 tablet (20 mEq total) by mouth 2 (two) times daily. 05/23/23   Wanda Plump, MD  sertraline (ZOLOFT) 50 MG tablet Take 1 tablet (50 mg total) by mouth daily. 02/28/23   Donato Schultz, DO    Physical Exam: Vitals:   08/17/23 0930 08/17/23 1136 08/17/23 1245 08/17/23 1332  BP:  (!) 171/100 (!) 169/95   Pulse: 79 95 95   Resp: (!) 43 (!)  35 (!) 41   Temp:    98.1 F (36.7 C)  TempSrc:    Oral  SpO2: 100% 100% 98%   Weight:      Height:        General: alert and oriented to time, place, and person. Appear in moderate distress, affect anxious Eyes: PERRL, Conjunctiva normal ENT: Oral Mucosa Clear, moist  Neck: difficult to assess  JVD, no Abnormal Mass Or lumps Cardiovascular: S1 and S2 Present, no Murmur, peripheral pulses symmetrical Respiratory:  increased respiratory effort, Bilateral Air entry equal and Decreased, no signs of accessory muscle use, faint basal Crackles, no wheezes Abdomen: Bowel Sound present, Soft and no tenderness, no hernia Skin: no rashes  Extremities: bilateral  Pedal edema, no calf tenderness Neurologic: without any new focal findings, mental status, alert and oriented x3, speech normal, and PERLA nasolabial fold clear, able to smile without any deviation, no tongue deviation but does have a lower angle of mouth on right side.  Data Reviewed: I have personally reviewed and interpreted labs, imaging as discussed below.  CBC: Recent Labs  Lab 08/17/23 1012 08/17/23 1019  WBC 10.5  --   NEUTROABS 8.2*  --   HGB 13.7 14.3  HCT 44.4 42.0  MCV 87.2  --   PLT 362  --    Basic Metabolic Panel: Recent Labs  Lab 08/17/23 1012 08/17/23 1019  NA 138 140  K 3.6 3.5  CL 105 107  CO2 21*  --   GLUCOSE 105* 104*  BUN 7* 7*  CREATININE 0.80 0.70  CALCIUM 8.6*  --    GFR: Estimated Creatinine Clearance: 94.5 mL/min (by C-G formula based on SCr of 0.7 mg/dL). Liver Function Tests: No results for input(s): "AST", "ALT", "ALKPHOS", "BILITOT", "PROT", "ALBUMIN" in the last 168 hours. No results for input(s): "LIPASE", "AMYLASE" in the last 168 hours. No results for input(s): "AMMONIA" in the last 168 hours. Coagulation Profile: No results for input(s): "INR", "PROTIME" in the last 168 hours. Cardiac Enzymes: No results for input(s): "CKTOTAL", "CKMB", "CKMBINDEX", "TROPONINI" in the last  168 hours. BNP (last 3 results) Recent Labs    01/09/23 1436 07/25/23 1337  PROBNP 14.0 12.0   HbA1C: No results for input(s): "HGBA1C" in the last 72 hours. CBG: No results for input(s): "GLUCAP" in the last 168 hours. Lipid Profile: No results for input(s): "CHOL", "HDL", "LDLCALC", "TRIG", "CHOLHDL", "LDLDIRECT" in the last 72 hours. Thyroid Function Tests: No results for input(s): "TSH", "T4TOTAL", "FREET4", "T3FREE", "THYROIDAB" in the last 72 hours. Anemia Panel: No results for input(s): "VITAMINB12", "FOLATE", "FERRITIN", "TIBC", "IRON", "RETICCTPCT" in the last 72 hours. Urine analysis:    Component Value Date/Time   COLORURINE YELLOW 12/28/2022 2159   APPEARANCEUR CLOUDY (A) 12/28/2022 2159   LABSPEC 1.021 12/28/2022 2159   PHURINE 5.0 12/28/2022 2159   GLUCOSEU NEGATIVE 12/28/2022 2159   GLUCOSEU NEGATIVE 10/08/2014 1055   HGBUR NEGATIVE 12/28/2022 2159   BILIRUBINUR negative 02/17/2023 1449   KETONESUR NEGATIVE 12/28/2022 2159   PROTEINUR Positive (A) 02/17/2023 1449   PROTEINUR NEGATIVE 12/28/2022 2159   UROBILINOGEN 0.2 02/17/2023 1449   UROBILINOGEN 0.2 10/08/2014 1055   NITRITE negative 02/17/2023 1449   NITRITE NEGATIVE 12/28/2022 2159   LEUKOCYTESUR Negative 02/17/2023 1449   LEUKOCYTESUR NEGATIVE 12/28/2022 2159    Radiological Exams on Admission: DG Chest Port 1 View  Result Date: 08/17/2023 CLINICAL DATA:  Shortness of breath. EXAM: PORTABLE CHEST 1 VIEW COMPARISON:  Chest radiograph dated 07/25/2023 FINDINGS: The heart size and mediastinal contours are unchanged. Bibasilar linear atelectasis or scarring. Bilateral interstitial prominence. No pneumothorax or sizable pleural effusion. The visualized skeletal structures are unchanged. IMPRESSION: Cardiomegaly with pulmonary vascular congestion. Electronically Signed   By: Hart Robinsons M.D.   On: 08/17/2023 10:45   EKG: Independently reviewed. normal sinus rhythm, nonspecific ST and T waves  changes. Echocardiogram: May 2024 EF 6065%.  No diastolic dysfunction.  RV normal.  No significant valvular abnormality.  I reviewed all nursing notes, pharmacy notes, vitals, pertinent old  records.  Assessment/Plan Acute on chronic diastolic (congestive) heart failure (HCC) Hypertensive emergency. Presents with complaints of chest pain and and shortness of breath. Symptoms currently resolved. Later in the day actually told me that he feels like he is ready to go home. Still blood pressure elevated. EMS found his systolic blood pressure was 200 at the time of their contact and patient had severe respiratory distress requiring BiPAP therapy. Suspect the patient is suffering from acute on chronic diastolic heart failure in the setting of hypertensive emergency. Will aggressively control his blood pressure and provide aggressive diuresis with IV Lasix. Suspect it will be premature to go home despite improvement in respiratory distress. Home blood pressure regimen includes Lasix only. Will initiate Imdur hydralazine.  Also losartan 25 mg.  Anxiety and depression Suspect a component of his presentation could be associate with severe anxiety leading to respiratory distress/panic attack leading to hypertension leading to true volume overload. On gabapentin at home which I will continue.  Also on Zoloft at home at 50 mg.  Which I will continue.  MCI (mild cognitive impairment) Continue Aricept.  Hyperlipidemia. Continue statin.  Obesity (BMI 30-39.9) Body mass index is 35.87 kg/m.  Placing the patient at high risk of poor outcome. Would recommend outpatient sleep study again.  Chronic edema With provide aggressive IV diuresis and monitor response. No evidence of DVT on clinical examination.  Nutrition: Cardiac diet DVT Prophylaxis: Subcutaneous Lovenox  Advance goals of care discussion:   Code Status: Do not attempt resuscitation (DNR) PRE-ARREST INTERVENTIONS DESIRED   Consults:  none   Family Communication: family was present at bedside, at the time of interview.  Opportunity was given to ask question and all questions were answered satisfactorily.   Disposition:  From: Home Likely will need Home health on discharge.   Author: Lynden Oxford, MD [facility] 08/17/2023 4:11 PM   To reach On-call, see care teams to locate the attending and reach out to them via [email or web address]. If 7PM-7AM, please contact night-coverage If you still have difficulty reaching the attending provider, please page the [facility] (Director on Call) for [facility] on amion for assistance.

## 2023-08-17 NOTE — Evaluation (Addendum)
Physical Therapy Evaluation & Discharge Patient Details Name: Larry Elliott MRN: [id number] DOB: 1946/07/12 Today's Date: 08/17/2023  History of Present Illness  Pt is a 77 y.o. male who presented 08/17/23 with SOB. PMH: CAD, diverticulosis, HLD, HTN, insomnia, melanoma, prostate CA, L THA anterior approach 12/27/22   Clinical Impression  Pt presents with condition above. PTA, he was independent, intermittently utilizing a SPC but often not utilizing any AD for functional mobility. Pt lives with his wife and son in a 1-level house with 2 STE. Currently, pt appears and reports to be mobilizing at his baseline other than having an increased RR. His RR was up to 30s when mobilizing within the room. There was no box to monitor it with hallway ambulation. His SpO2 remained >/= 92% on RA throughout. Pt does have edema in his bil lower legs (R>L) along with discoloration. Recommending follow-up with OPPT for lymphedema management at [facility], which is pt and family's preference. Encouraged use of IS provided and frequent mobility while here. Educated pt and family on elevating legs, performing ankle pumps, and ACE wrapping legs as needed to manage edema. Educated pt and family on healthy eating. They verbalized understanding. All education completed and questions answered. PT will sign off.      If plan is discharge home, recommend the following:     Can travel by private vehicle        Equipment Recommendations None recommended by PT  Recommendations for Other Services       Functional Status Assessment Patient has not had a recent decline in their functional status     Precautions / Restrictions Precautions Precautions: Other (comment) Precaution Comments: watch RR Restrictions Weight Bearing Restrictions: No      Mobility  Bed Mobility Overal bed mobility: Needs Assistance Bed Mobility: Supine to Sit, Sit to Supine     Supine to sit: Supervision, HOB elevated Sit  to supine: Supervision, HOB elevated   General bed mobility comments: Supervision for safety with pt using momentum to manage trunk and legs at times, HOB elevated    Transfers Overall transfer level: Needs assistance Equipment used: None Transfers: Sit to/from Stand Sit to Stand: Supervision           General transfer comment: Supervision for safety standing from edge of stretcher    Ambulation/Gait Ambulation/Gait assistance: Supervision Gait Distance (Feet): 230 Feet Assistive device: None Gait Pattern/deviations: Step-through pattern, Decreased stride length Gait velocity: reduced Gait velocity interpretation: >2.62 ft/sec, indicative of community ambulatory   General Gait Details: Pt ambulates with short steps bil and stiffness noted in L hip (s/p L THA March 2024). Appears to be his baseline, which was confirmed by pt and family. No LOB, supervision for safety  Stairs            Wheelchair Mobility     Tilt Bed    Modified Rankin (Stroke Patients Only)       Balance Overall balance assessment: Mild deficits observed, not formally tested                                           Pertinent Vitals/Pain Pain Assessment Pain Assessment: Faces Faces Pain Scale: Hurts a little bit Pain Location: generalized with movement Pain Descriptors / Indicators: Discomfort, Grimacing Pain Intervention(s): Limited activity within patient's tolerance, Monitored during session, Repositioned    Home Living Family/patient  expects to be discharged to:: Private residence Living Arrangements: Spouse/significant other;Children (adult son) Available Help at Discharge: Family;Available 24 hours/day Type of Home: House Home Access: Stairs to enter Entrance Stairs-Rails: Can reach both Entrance Stairs-Number of Steps: 2   Home Layout: One level Home Equipment: Agricultural consultant (2 wheels);BSC/3in1;Shower seat;Grab bars - tub/shower;Grab bars - toilet;Cane -  single point      Prior Function Prior Level of Function : Independent/Modified Independent             Mobility Comments: Intermittently uses SPC in L hand, otherwise no AD; no falls in at least 6 months ADLs Comments: independent, driving; nursing coming out for medication assistance and managing his edema     Extremity/Trunk Assessment   Upper Extremity Assessment Upper Extremity Assessment: Overall WFL for tasks assessed    Lower Extremity Assessment Lower Extremity Assessment: RLE deficits/detail;LLE deficits/detail RLE Deficits / Details: edema noted with discoloration spots distally, R>L; overall WFL strength; reports decreased sensation bil at lateral feet RLE Sensation: decreased light touch RLE Coordination: WNL LLE Deficits / Details: edema noted with discoloration spots distally, R>L; overall WFL strength; L hip stiff with functional mobility since THA in March 2024; reports decreased sensation bil at lateral feet LLE Sensation: decreased light touch LLE Coordination: WNL    Cervical / Trunk Assessment Cervical / Trunk Assessment: Other exceptions Cervical / Trunk Exceptions: increased body habitus  Communication   Communication Communication: Hearing impairment  Cognition Arousal: Alert Behavior During Therapy: WFL for tasks assessed/performed Overall Cognitive Status: Within Functional Limits for tasks assessed                                          General Comments General comments (skin integrity, edema, etc.): VSS on RA except RR up to 30s (RR only measured when on monitor within room, no box to bring monitor with pt when ambulating halls); encouraged use of IS provided and frequent mobility while here; educated pt and family on elevating legs, performing ankle pumps, and ACE wrapping legs as needed to manage edema; educated pt and family on healthy eating, cutting down on sugary and salty foods; they verbalized understanding     Exercises     Assessment/Plan    PT Assessment All further PT needs can be met in the next venue of care  PT Problem List Decreased activity tolerance;Decreased balance;Decreased mobility;Cardiopulmonary status limiting activity;Impaired sensation;Obesity;Decreased skin integrity       PT Treatment Interventions      PT Goals (Current goals can be found in the Care Plan section)  Acute Rehab PT Goals Patient Stated Goal: to go home PT Goal Formulation: All assessment and education complete, DC therapy Time For Goal Achievement: 08/18/23 Potential to Achieve Goals: Good    Frequency       Co-evaluation               AM-PAC PT "6 Clicks" Mobility  Outcome Measure Help needed turning from your back to your side while in a flat bed without using bedrails?: A Little Help needed moving from lying on your back to sitting on the side of a flat bed without using bedrails?: A Little Help needed moving to and from a bed to a chair (including a wheelchair)?: A Little Help needed standing up from a chair using your arms (e.g., wheelchair or bedside chair)?: A Little Help needed to walk  in hospital room?: A Little Help needed climbing 3-5 steps with a railing? : A Little 6 Click Score: 18    End of Session Equipment Utilized During Treatment: Gait belt Activity Tolerance: Patient tolerated treatment well Patient left: in bed;with call bell/phone within reach;with family/visitor present Nurse Communication: Mobility status;Other (comment) (vitals) PT Visit Diagnosis: Unsteadiness on feet (R26.81);Other abnormalities of gait and mobility (R26.89)    Time: 1478-2956 PT Time Calculation (min) (ACUTE ONLY): 23 min   Charges:   PT Evaluation $PT Eval Low Complexity: 1 Low PT Treatments $Therapeutic Activity: 8-22 mins PT General Charges $$ ACUTE PT VISIT: 1 Visit         Virgil Benedict, PT, DPT Acute Rehabilitation Services  Office: [phone]   Bettina Gavia 08/17/2023, 4:31 PM

## 2023-08-17 NOTE — ED Provider Notes (Signed)
[facility] Provider Note   CSN: [id number] Arrival date & time: 08/17/23  0920     History  Chief Complaint  Patient presents with   Shortness of Breath    Larry Elliott is a 77 y.o. male.  Larry Elliott is a 77 y.o. male history of chronic diastolic heart failure, nonobstructive CAD, hypertension, hyperlipidemia and dementia, who presents to the ED via EMS for evaluation of shortness of breath.  EMS reports that when they arrived on scene patient was tachypneic with labored breathing, breathing 60 times a minute, diaphoretic and ill-appearing.  They immediately placed patient on 4 L nasal cannula with some improvement in respiratory effort, they report diminished breath sounds.  EKG on scene without signs of STEMI and patient denies any associated chest pain.  He reports that he started feeling some mild shortness of breath yesterday afternoon but he reports overnight it got worse and he could not find a comfortable position he tried sitting up and laying down but despite this continues to have worsening shortness of breath.  He denies any associated cough or fever, no recent sick contacts.  He denies pain at all yesterday.  He reports he has chronic lower extremity swelling but does not think this is gotten worse, he does not monitor his weights routinely.  He reports that he is supposed to be taking Lasix twice daily but is unsure how regularly he has taken this.  Echo completed in May 2024 showed EF of 60 to 65% and no significant valvular disease.  Patient is followed by Dr. Jerene Pitch with cardiology.  Patient's daughter later arrived and reports that they have been trying to get lower extremity edema under control and Dr. Drue Novel and just recommended increasing Lasix to 20 mg 3 times a day but then symptoms occurred overnight and worsened.  Daughter also expresses concern about medication management, patient just started having in-home nursing  here to help improve this over the past 4 days.  She also expresses concern about diet that may be high in sugar and sodium.`  The history is provided by the patient, the EMS personnel and medical records.  Shortness of Breath Associated symptoms: diaphoresis   Associated symptoms: no chest pain, no cough and no fever        Home Medications Prior to Admission medications   Medication Sig Start Date End Date Taking? Authorizing Provider  atorvastatin (LIPITOR) 40 MG tablet Take 1 tablet (40 mg total) by mouth at bedtime. 07/20/22   Wanda Plump, MD  docusate sodium (COLACE) 100 MG capsule Take 1 capsule (100 mg total) by mouth 2 (two) times daily as needed for mild constipation. 01/02/23   Rolly Salter, MD  donepezil (ARICEPT) 5 MG tablet Take 1 tablet (5 mg total) by mouth at bedtime. 05/08/23   Wanda Plump, MD  doxycycline (VIBRA-TABS) 100 MG tablet Take 1 tablet (100 mg total) by mouth 2 (two) times daily. 07/25/23   Olive Bass, FNP  fenofibrate micronized (LOFIBRA) 134 MG capsule Take 1 capsule (134 mg total) by mouth daily before breakfast. 09/29/22   Wanda Plump, MD  furosemide (LASIX) 20 MG tablet Take 1 tablet (20 mg total) by mouth 2 (two) times daily. 07/25/23   Olive Bass, FNP  gabapentin (NEURONTIN) 100 MG capsule Take 1 capsule (100 mg total) by mouth 3 (three) times daily. 02/28/23   Seabron Spates R, DO  melatonin 5 MG TABS  Take 1 tablet (5 mg total) by mouth at bedtime. 01/02/23   Rolly Salter, MD  ondansetron (ZOFRAN) 4 MG tablet TAKE 1 TABLET BY MOUTH EVERY 8 HOURS AS NEEDED FOR NAUSEA OR VOMITING Patient not taking: Reported on 08/08/2023 03/09/23   Wanda Plump, MD  oxyCODONE-acetaminophen (PERCOCET) 5-325 MG tablet Take 1 tablet by mouth every 6 (six) hours as needed for moderate pain or severe pain (post op pain). 01/02/23   Rolly Salter, MD  potassium chloride SA (KLOR-CON M) 20 MEQ tablet Take 1 tablet (20 mEq total) by mouth 2 (two) times  daily. 05/23/23   Wanda Plump, MD  sertraline (ZOLOFT) 50 MG tablet Take 1 tablet (50 mg total) by mouth daily. 02/28/23   Donato Schultz, DO      Allergies    Ambien [zolpidem], Aspirin, Hydrocodone, and Penicillins    Review of Systems   Review of Systems  Constitutional:  Positive for diaphoresis. Negative for chills and fever.  Respiratory:  Positive for shortness of breath. Negative for cough.   Cardiovascular:  Positive for leg swelling. Negative for chest pain.  All other systems reviewed and are negative.   Physical Exam Updated Vital Signs There were no vitals taken for this visit. Physical Exam Vitals and nursing note reviewed.  Constitutional:      General: He is not in acute distress.    Appearance: Normal appearance. He is well-developed. He is ill-appearing. He is not diaphoretic.  HENT:     Head: Normocephalic and atraumatic.  Eyes:     General:        Right eye: No discharge.        Left eye: No discharge.     Pupils: Pupils are equal, round, and reactive to light.  Cardiovascular:     Rate and Rhythm: Normal rate and regular rhythm.     Pulses: Normal pulses.     Heart sounds: Normal heart sounds.  Pulmonary:     Effort: Tachypnea, accessory muscle usage and respiratory distress present.     Breath sounds: Decreased breath sounds present. No wheezing, rhonchi or rales.     Comments: Respirations equal and unlabored, patient able to speak in full sentences, lungs clear to auscultation bilaterally  Abdominal:     General: Bowel sounds are normal. There is no distension.     Palpations: Abdomen is soft. There is no mass.     Tenderness: There is no abdominal tenderness. There is no guarding.     Comments: Abdomen soft, nondistended, nontender to palpation in all quadrants without guarding or peritoneal signs  Musculoskeletal:        General: No deformity.     Cervical back: Neck supple.  Skin:    General: Skin is warm and dry.     Capillary Refill:  Capillary refill takes less than 2 seconds.  Neurological:     Mental Status: He is alert and oriented to person, place, and time.     Coordination: Coordination normal.     Comments: Speech is clear, able to follow commands Moves extremities without ataxia, coordination intact  Psychiatric:        Mood and Affect: Mood normal.        Behavior: Behavior normal.     ED Results / Procedures / Treatments   Labs (all labs ordered are listed, but only abnormal results are displayed) Labs Reviewed  BASIC METABOLIC PANEL - Abnormal; Notable for the following components:  Result Value   CO2 21 (*)    Glucose, Bld 105 (*)    BUN 7 (*)    Calcium 8.6 (*)    All other components within normal limits  CBC WITH DIFFERENTIAL/PLATELET - Abnormal; Notable for the following components:   Neutro Abs 8.2 (*)    Abs Immature Granulocytes 0.09 (*)    All other components within normal limits  COMPREHENSIVE METABOLIC PANEL - Abnormal; Notable for the following components:   Potassium 3.3 (*)    CO2 20 (*)    Glucose, Bld 119 (*)    Calcium 8.5 (*)    Total Protein 6.0 (*)    Albumin 3.1 (*)    Total Bilirubin 1.4 (*)    All other components within normal limits  I-STAT CHEM 8, ED - Abnormal; Notable for the following components:   BUN 7 (*)    Glucose, Bld 104 (*)    Calcium, Ion 0.99 (*)    TCO2 20 (*)    All other components within normal limits  BRAIN NATRIURETIC PEPTIDE  CBC  CBC  BASIC METABOLIC PANEL  MAGNESIUM  TROPONIN I (HIGH SENSITIVITY)  TROPONIN I (HIGH SENSITIVITY)    EKG EKG Interpretation Date/Time:  Thursday August 17 2023 09:28:04 EDT Ventricular Rate:  84 PR Interval:  170 QRS Duration:  90 QT Interval:  395 QTC Calculation: 467 R Axis:   13  Text Interpretation: Sinus rhythm Low voltage, precordial leads Abnormal R-wave progression, early transition no significant change since Mar 2024 Confirmed by Pricilla Loveless [phone]) on 08/17/2023 9:59:59  AM  Radiology  DG Chest Port 1 View  Result Date: 08/17/2023 CLINICAL DATA:  Shortness of breath. EXAM: PORTABLE CHEST 1 VIEW COMPARISON:  Chest radiograph dated 07/25/2023 FINDINGS: The heart size and mediastinal contours are unchanged. Bibasilar linear atelectasis or scarring. Bilateral interstitial prominence. No pneumothorax or sizable pleural effusion. The visualized skeletal structures are unchanged. IMPRESSION: Cardiomegaly with pulmonary vascular congestion. Electronically Signed   By: Hart Robinsons M.D.   On: 08/17/2023 10:45    Procedures Procedures    Medications Ordered in ED Medications  furosemide (LASIX) injection 40 mg (40 mg Intravenous Given 08/17/23 1058)    ED Course/ Medical Decision Making/ A&P                                 Medical Decision Making Amount and/or Complexity of Data Reviewed Labs: ordered. Radiology: ordered.  Risk Prescription drug management. Decision regarding hospitalization.   77 y.o. male presents to the ED with complaints of shortness of breath, this involves an extensive number of treatment options, and is a complaint that carries with it a high risk of complications and morbidity.  The differential diagnosis includes pulmonary edema, ACS, CHF, arrhythmia, PE, pneumonia.  On arrival pt is ill-appearing with increased respiratory effort and RR of 40 on 4L [place], decreased air movement without wheezing, rales or rhonchi audible.  Additional history obtained from EMS. Previous records obtained and reviewed   I ordered medication Lasix for pulmonary edema  Lab Tests:  I Ordered, reviewed, and interpreted labs, which included: No leukocytosis, BMP without derrangements, troponin normal, BNP not elevated  EKG with NSR, no ischemic changes  Imaging Studies ordered:  I ordered imaging studies which included CXR, I independently visualized and interpreted imaging which showed cardiomegaly and pulmonary vascular congestion  ED  Course:   Critical interventions: BiPAP  Patient with shortness of  breath and increased respiratory effort, placed on BiPAP due to persistent tachypnea and increased respiratory effort, chest x-ray with signs of pulmonary edema, suspect BiPAP will also help to put fluid on the lungs.  Lasix given to help with fluid overload.  Patient improving with treatment here in the ED, will trial discontinuing BiPAP.  Patient with pulmonary edema and fluid overload, in the setting of chronic diastolic heart failure.  Will consult hospitalist for admission and further diuresis.  Case discussed with Dr. Allena Katz who will see and admit the patient.    Portions of this note were generated with Scientist, clinical (histocompatibility and immunogenetics). Dictation errors may occur despite best attempts at proofreading.         Final Clinical Impression(s) / ED Diagnoses Final diagnoses:  Acute hypoxic respiratory failure (HCC)  Acute on chronic diastolic (congestive) heart failure [facility])    Rx / DC Orders ED Discharge Orders     None         Legrand Rams 08/18/23 2355    Pricilla Loveless, MD 08/22/23 [phone]

## 2023-08-17 NOTE — ED Triage Notes (Signed)
Pt bib ems due to shob that started last night and got bad at 0700. Pt breathing 60/ min for EMS and was on 4L [place]. 200 systolic.

## 2023-08-18 ENCOUNTER — Other Ambulatory Visit (HOSPITAL_COMMUNITY): Payer: Self-pay

## 2023-08-18 ENCOUNTER — Inpatient Hospital Stay (HOSPITAL_COMMUNITY): Payer: Medicare Other

## 2023-08-18 DIAGNOSIS — J9601 Acute respiratory failure with hypoxia: Secondary | ICD-10-CM | POA: Diagnosis not present

## 2023-08-18 DIAGNOSIS — R0609 Other forms of dyspnea: Secondary | ICD-10-CM

## 2023-08-18 DIAGNOSIS — I5033 Acute on chronic diastolic (congestive) heart failure: Secondary | ICD-10-CM | POA: Diagnosis not present

## 2023-08-18 DIAGNOSIS — I161 Hypertensive emergency: Secondary | ICD-10-CM

## 2023-08-18 DIAGNOSIS — I2510 Atherosclerotic heart disease of native coronary artery without angina pectoris: Secondary | ICD-10-CM

## 2023-08-18 LAB — CBC
HCT: 42.4 % (ref 39.0–52.0)
Hemoglobin: 13.6 g/dL (ref 13.0–17.0)
MCH: 27.8 pg (ref 26.0–34.0)
MCHC: 32.1 g/dL (ref 30.0–36.0)
MCV: 86.5 fL (ref 80.0–100.0)
Platelets: 368 10*3/uL (ref 150–400)
RBC: 4.9 MIL/uL (ref 4.22–5.81)
RDW: 15 % (ref 11.5–15.5)
WBC: 9.4 10*3/uL (ref 4.0–10.5)
nRBC: 0 % (ref 0.0–0.2)

## 2023-08-18 LAB — COMPREHENSIVE METABOLIC PANEL
ALT: 31 U/L (ref 0–44)
AST: 20 U/L (ref 15–41)
Albumin: 3.1 g/dL — ABNORMAL LOW (ref 3.5–5.0)
Alkaline Phosphatase: 61 U/L (ref 38–126)
Anion gap: 12 (ref 5–15)
BUN: 12 mg/dL (ref 8–23)
CO2: 20 mmol/L — ABNORMAL LOW (ref 22–32)
Calcium: 8.5 mg/dL — ABNORMAL LOW (ref 8.9–10.3)
Chloride: 107 mmol/L (ref 98–111)
Creatinine, Ser: 0.88 mg/dL (ref 0.61–1.24)
GFR, Estimated: >60 mL/min (ref >60)
Glucose, Bld: 119 mg/dL — ABNORMAL HIGH (ref 70–99)
Potassium: 3.3 mmol/L — ABNORMAL LOW (ref 3.5–5.1)
Sodium: 139 mmol/L (ref 135–145)
Total Bilirubin: 1.4 mg/dL — ABNORMAL HIGH (ref 0.3–1.2)
Total Protein: 6 g/dL — ABNORMAL LOW (ref 6.5–8.1)

## 2023-08-18 LAB — ECHOCARDIOGRAM LIMITED
Area-P 1/2: 3.31 cm2
Height: 70 in
Weight: 4000 [oz_av]

## 2023-08-18 LAB — HEMOGLOBIN A1C
Hgb A1c MFr Bld: 6 % — ABNORMAL HIGH (ref 4.8–5.6)
Mean Plasma Glucose: 125.5 mg/dL

## 2023-08-18 LAB — D-DIMER, QUANTITATIVE: D-Dimer, Quant: 0.53 ug{FEU}/mL — ABNORMAL HIGH (ref 0.00–0.50)

## 2023-08-18 MED ORDER — EMPAGLIFLOZIN 10 MG PO TABS
10.0000 mg | ORAL_TABLET | Freq: Every day | ORAL | Status: DC
  Administered 2023-08-18 – 2023-08-20 (×3): 10 mg via ORAL
  Filled 2023-08-18 (×3): qty 1

## 2023-08-18 MED ORDER — SPIRONOLACTONE 25 MG PO TABS
25.0000 mg | ORAL_TABLET | Freq: Every day | ORAL | Status: DC
  Administered 2023-08-18 – 2023-08-20 (×3): 25 mg via ORAL
  Filled 2023-08-18 (×3): qty 1

## 2023-08-18 MED ORDER — POTASSIUM CHLORIDE CRYS ER 20 MEQ PO TBCR
40.0000 meq | EXTENDED_RELEASE_TABLET | Freq: Two times a day (BID) | ORAL | Status: AC
  Administered 2023-08-18 (×2): 40 meq via ORAL
  Filled 2023-08-18 (×2): qty 2

## 2023-08-18 MED ORDER — LOPERAMIDE HCL 2 MG PO CAPS
2.0000 mg | ORAL_CAPSULE | ORAL | Status: DC | PRN
  Administered 2023-08-18 – 2023-08-19 (×2): 2 mg via ORAL
  Filled 2023-08-18 (×2): qty 1

## 2023-08-18 NOTE — Plan of Care (Signed)
?  Problem: Education: ?Goal: Ability to verbalize understanding of medication therapies will improve ?Outcome: Progressing ?  ?Problem: Activity: ?Goal: Capacity to carry out activities will improve ?Outcome: Progressing ?  ?Problem: Cardiac: ?Goal: Ability to achieve and maintain adequate cardiopulmonary perfusion will improve ?Outcome: Progressing ?  ?

## 2023-08-18 NOTE — Progress Notes (Signed)
  Echocardiogram 2D Echocardiogram has been performed.  Larry Elliott 08/18/2023, 4:43 PM

## 2023-08-18 NOTE — ED Notes (Signed)
ED TO INPATIENT HANDOFF REPORT  ED Nurse Name and Phone #: Dahlia Client [phone]  S Name/Age/Gender Larry Elliott 77 y.o. male Room/Bed: 039C/039C  Code Status   Code Status: Do not attempt resuscitation (DNR) PRE-ARREST INTERVENTIONS DESIRED  Home/SNF/Other Home Patient oriented to: self, place, time, and situation Is this baseline? Yes   Triage Complete: Triage complete  Chief Complaint Acute on chronic diastolic (congestive) heart failure (HCC) [I50.33]  Triage Note Pt bib ems due to shob that started last night and got bad at 0700. Pt breathing 60/ min for EMS and was on 4L [place]. 200 systolic.    Allergies Allergies  Allergen Reactions   Ambien [Zolpidem] Other (See Comments)    Encephalopathy, admitted 04-2022, symptoms suspected to be from [place]   Aspirin Swelling    swelling in face   Hydrocodone Other (See Comments)    Insomnia, mild SOB (w/o cough-wheezing)   Penicillins Swelling    swelling in face    Level of Care/Admitting Diagnosis ED Disposition     ED Disposition  Admit   Condition  --   Comment  Hospital Area: [facility] [[id number]]  Level of Care: Telemetry Cardiac [[id number]]  May admit patient to [facility] or [facility] if equivalent level of care is available:: Yes  Covid Evaluation: Asymptomatic - no recent exposure (last 10 days) testing not required  Diagnosis: Acute on chronic diastolic (congestive) heart failure [facility]) [[id number]]  Admitting Physician: Rolly Salter [[id number]]  Attending Physician: Rolly Salter [[id number]]  Certification:: I certify there are rare and unusual circumstances requiring inpatient admission  Expected Medical Readiness: 08/20/2023          B Medical/Surgery History Past Medical History:  Diagnosis Date   Allergy    penicillin & Aspirin   Anxiety 11/04/2013   Arthritis    BCC (basal cell carcinoma), leg    Right calf, L nape of neck at hairline   CAD (coronary artery disease)    CP, cath,  non-obstructive   Cataract    been removed   Depression 11/04/2013   Diverticulosis    colon   Erectile dysfunction following radical prostatectomy    Hyperlipidemia    Hypertension    Insomnia    Melanoma (HCC) 2010   face, sees derm    Prostate CA (HCC) 07/2007   s/p robotic surgery   Spermatocele    Left   Past Surgical History:  Procedure Laterality Date   APPENDECTOMY     age 62   EYE SURGERY     cataracts removed   HERNIA REPAIR  2010   JOINT REPLACEMENT  hip 2024   KNEE ARTHROSCOPY Left 1965   meniscal tear   PROSTATECTOMY  07/25/2007    robotic,  for prostate cancer     SHOULDER ARTHROSCOPY Left 07/09/2013   Dr Ave Filter    SPINE SURGERY     TOTAL HIP ARTHROPLASTY Left 12/27/2022   Procedure: LEFT TOTAL HIP ARTHROPLASTY ANTERIOR APPROACH;  Surgeon: Marcene Corning, MD;  Location: [facility] ORS;  Service: Orthopedics;  Laterality: Left;   TRANSFORAMINAL LUMBAR INTERBODY FUSION (TLIF) WITH PEDICLE SCREW FIXATION 1 LEVEL Right 12/04/2019   Procedure: RIGHT-SIDED TRANSFORAMINAL LUMBAR INTERBODY FUSION LUMBAR FOUR THROUGH FIVE WITH INSTRUMENTATION AND ALLOGRAFT;  Surgeon: Estill Bamberg, MD;  Location: [facility] OR;  Service: Orthopedics;  Laterality: Right;     A IV Location/Drains/Wounds Patient Lines/Drains/Airways Status     Active Line/Drains/Airways     Name Placement date Placement time Site  Days   Peripheral IV 08/17/23 20 G Anterior;Right Hand 08/17/23  0926  Hand  1            Intake/Output Last 24 hours  Intake/Output Summary (Last 24 hours) at 08/18/2023 0850 Last data filed at 08/18/2023 0554 Gross per 24 hour  Intake --  Output 150 ml  Net -150 ml    Labs/Imaging Results for orders placed or performed during the hospital encounter of 08/17/23 (from the past 48 hour(s))  Basic metabolic panel     Status: Abnormal   Collection Time: 08/17/23 10:12 AM  Result Value Ref Range   Sodium 138 135 - 145 mmol/L   Potassium 3.6 3.5 - 5.1 mmol/L   Chloride  105 98 - 111 mmol/L   CO2 21 (L) 22 - 32 mmol/L   Glucose, Bld 105 (H) 70 - 99 mg/dL    Comment: Glucose reference range applies only to samples taken after fasting for at least 8 hours.   BUN 7 (L) 8 - 23 mg/dL   Creatinine, Ser 4.09 0.61 - 1.24 mg/dL   Calcium 8.6 (L) 8.9 - 10.3 mg/dL   GFR, Estimated >81 >19 mL/min    Comment: (NOTE) Calculated using the CKD-EPI Creatinine Equation (2021)    Anion gap 12 5 - 15    Comment: Performed at [facility], [address] [place], [place] [address]  Brain natriuretic peptide     Status: None   Collection Time: 08/17/23 10:12 AM  Result Value Ref Range   B Natriuretic Peptide 14.6 0.0 - 100.0 pg/mL    Comment: Performed at [facility], [address], [place], [place] [address]  Troponin I (High Sensitivity)     Status: None   Collection Time: 08/17/23 10:12 AM  Result Value Ref Range   Troponin I (High Sensitivity) 4 <18 ng/L    Comment: (NOTE) Elevated high sensitivity troponin I (hsTnI) values and significant  changes across serial measurements may suggest ACS but many other  chronic and acute conditions are known to elevate hsTnI results.  Refer to the "Links" section for chest pain algorithms and additional  guidance. Performed at [facility], [address], [place], [place] [address]   CBC with Differential     Status: Abnormal   Collection Time: 08/17/23 10:12 AM  Result Value Ref Range   WBC 10.5 4.0 - 10.5 K/uL   RBC 5.09 4.22 - 5.81 MIL/uL   Hemoglobin 13.7 13.0 - 17.0 g/dL   HCT 78.4 69.6 - 29.5 %   MCV 87.2 80.0 - 100.0 fL   MCH 26.9 26.0 - 34.0 pg   MCHC 30.9 30.0 - 36.0 g/dL   RDW 28.4 13.2 - 44.0 %   Platelets 362 150 - 400 K/uL   nRBC 0.0 0.0 - 0.2 %   Neutrophils Relative % 77 %   Neutro Abs 8.2 (H) 1.7 - 7.7 K/uL   Lymphocytes Relative 10 %   Lymphs Abs 1.0 0.7 - 4.0 K/uL   Monocytes Relative 9 %   Monocytes Absolute 0.9 0.1 - 1.0 K/uL   Eosinophils Relative 2 %   Eosinophils  Absolute 0.2 0.0 - 0.5 K/uL   Basophils Relative 1 %   Basophils Absolute 0.1 0.0 - 0.1 K/uL   Immature Granulocytes 1 %   Abs Immature Granulocytes 0.09 (H) 0.00 - 0.07 K/uL    Comment: Performed at [facility], [address], [place], [place] [address]  I-stat chem 8, ED     Status: Abnormal   Collection Time: 08/17/23 10:19 AM  Result Value Ref Range   Sodium 140 135 - 145 mmol/L   Potassium 3.5 3.5 - 5.1 mmol/L   Chloride 107 98 - 111 mmol/L   BUN 7 (L) 8 - 23 mg/dL   Creatinine, Ser 6.96 0.61 - 1.24 mg/dL   Glucose, Bld 295 (H) 70 - 99 mg/dL    Comment: Glucose reference range applies only to samples taken after fasting for at least 8 hours.   Calcium, Ion 0.99 (L) 1.15 - 1.40 mmol/L   TCO2 20 (L) 22 - 32 mmol/L   Hemoglobin 14.3 13.0 - 17.0 g/dL   HCT 28.4 13.2 - 44.0 %  Troponin I (High Sensitivity)     Status: None   Collection Time: 08/17/23 12:41 PM  Result Value Ref Range   Troponin I (High Sensitivity) 5 <18 ng/L    Comment: (NOTE) Elevated high sensitivity troponin I (hsTnI) values and significant  changes across serial measurements may suggest ACS but many other  chronic and acute conditions are known to elevate hsTnI results.  Refer to the "Links" section for chest pain algorithms and additional  guidance. Performed at [facility], [address] [place], [place] [address]   Comprehensive metabolic panel     Status: Abnormal   Collection Time: 08/18/23  4:39 AM  Result Value Ref Range   Sodium 139 135 - 145 mmol/L   Potassium 3.3 (L) 3.5 - 5.1 mmol/L   Chloride 107 98 - 111 mmol/L   CO2 20 (L) 22 - 32 mmol/L   Glucose, Bld 119 (H) 70 - 99 mg/dL    Comment: Glucose reference range applies only to samples taken after fasting for at least 8 hours.   BUN 12 8 - 23 mg/dL   Creatinine, Ser 5.36 0.61 - 1.24 mg/dL   Calcium 8.5 (L) 8.9 - 10.3 mg/dL   Total Protein 6.0 (L) 6.5 - 8.1 g/dL   Albumin 3.1 (L) 3.5 - 5.0 g/dL   AST 20 15 - 41 U/L   ALT  31 0 - 44 U/L   Alkaline Phosphatase 61 38 - 126 U/L   Total Bilirubin 1.4 (H) 0.3 - 1.2 mg/dL   GFR, Estimated >64 >40 mL/min    Comment: (NOTE) Calculated using the CKD-EPI Creatinine Equation (2021)    Anion gap 12 5 - 15    Comment: Performed at [facility], [address] [place], [place] [address]  CBC     Status: None   Collection Time: 08/18/23  4:39 AM  Result Value Ref Range   WBC 9.4 4.0 - 10.5 K/uL   RBC 4.90 4.22 - 5.81 MIL/uL   Hemoglobin 13.6 13.0 - 17.0 g/dL   HCT 59.5 63.8 - 75.6 %   MCV 86.5 80.0 - 100.0 fL   MCH 27.8 26.0 - 34.0 pg   MCHC 32.1 30.0 - 36.0 g/dL   RDW 43.3 29.5 - 18.8 %   Platelets 368 150 - 400 K/uL   nRBC 0.0 0.0 - 0.2 %    Comment: Performed at [facility], [address] [place], [place] [address]   DG Chest Port 1 View  Result Date: 08/17/2023 CLINICAL DATA:  Shortness of breath. EXAM: PORTABLE CHEST 1 VIEW COMPARISON:  Chest radiograph dated 07/25/2023 FINDINGS: The heart size and mediastinal contours are unchanged. Bibasilar linear atelectasis or scarring. Bilateral interstitial prominence. No  pneumothorax or sizable pleural effusion. The visualized skeletal structures are unchanged. IMPRESSION: Cardiomegaly with pulmonary vascular congestion. Electronically Signed   By: Hart Robinsons M.D.   On: 08/17/2023 10:45    Pending Labs Unresulted Labs (From admission, onward)    None       Vitals/Pain Today's Vitals   08/18/23 0715 08/18/23 0721 08/18/23 0730 08/18/23 0800  BP:    116/70  Pulse: 72 75 91 72  Resp: (!) 21 (!) 24 13 (!) 21  Temp:    98.1 F (36.7 C)  TempSrc:    Oral  SpO2: (!) 89% 95% 96% 93%  Weight:      Height:      PainSc:        Isolation Precautions No active isolations  Medications Medications  atorvastatin (LIPITOR) tablet 40 mg (40 mg Oral Given 08/17/23 2247)  docusate sodium (COLACE) capsule 100 mg (100 mg Oral Given 08/17/23 2143)  donepezil (ARICEPT) tablet 5 mg (5 mg Oral Given  08/17/23 2144)  fenofibrate tablet 160 mg (160 mg Oral Not Given 08/17/23 1746)  gabapentin (NEURONTIN) capsule 100 mg (100 mg Oral Given 08/17/23 2247)  sertraline (ZOLOFT) tablet 50 mg (50 mg Oral Not Given 08/17/23 1746)  furosemide (LASIX) injection 40 mg (40 mg Intravenous Given 08/18/23 0747)  enoxaparin (LOVENOX) injection 50 mg (50 mg Subcutaneous Not Given 08/17/23 1747)  acetaminophen (TYLENOL) tablet 650 mg (has no administration in time range)    Or  acetaminophen (TYLENOL) suppository 650 mg (has no administration in time range)  ondansetron (ZOFRAN) tablet 4 mg (has no administration in time range)    Or  ondansetron (ZOFRAN) injection 4 mg (has no administration in time range)  polyethylene glycol (MIRALAX / GLYCOLAX) packet 17 g (has no administration in time range)  losartan (COZAAR) tablet 50 mg (50 mg Oral Not Given 08/17/23 2242)  isosorbide mononitrate (IMDUR) 24 hr tablet 30 mg (30 mg Oral Given 08/17/23 2219)  furosemide (LASIX) injection 40 mg (40 mg Intravenous Given 08/17/23 1058)    Mobility Standby assist     Focused Assessments Cardiac Assessment Handoff:  Cardiac Rhythm: Normal sinus rhythm Lab Results  Component Value Date   CKTOTAL 78 06/15/2010   CKMB 0.7 06/15/2010   TROPONINI 0.01        NO INDICATION OF MYOCARDIAL INJURY. 06/15/2010   Lab Results  Component Value Date   DDIMER 0.51 (H) 12/28/2022   Does the Patient currently have chest pain? No   , Pulmonary Assessment Handoff:  Lung sounds: Bilateral Breath Sounds: Fine crackles O2 Device: Nasal Cannula O2 Flow Rate (L/min): 2 L/min    R Recommendations: See Admitting Provider Note  Report given to:   Additional Notes:

## 2023-08-18 NOTE — Consult Note (Addendum)
Cardiology Consultation   Patient ID: REMBERT NAUGLE MRN: [id number]; DOB: 04/07/1946  Admit date: 08/17/2023 Date of Consult: 08/18/2023  PCP:  Wanda Plump, MD   [facility] Providers Cardiologist:  Little Ishikawa, MD        Patient Profile:   Larry Elliott is a 77 y.o. male with a history of minimal non-obstructive CAD on cardiac catheterization in 06/2010, ascending thoracic aortic aneurysm, hypertension, hyperlipidemia, chronic lower extremity edema, prostate cancer, and melanoma who is being seen 08/18/2023 for concerns of CHF at the request of Dr. Jomarie Longs.  History of Present Illness:   Larry Elliott is a 77 year old male with the above history who is followed by Dr. Bjorn Elliott.  He has a history of minimal nonobstructive CAD noted on a remote cardiac catheterization in 06/2010. Last Echo in 02/2023 showed LVEF of 60-65% with normal wall motion and diastolic parameters and no significant valvular disease. There was no mention of a dilated ascending aorta noted on that Echo but previous Echo in 04/2022 showed moderate dilatation of the ascending aorta measuring 44 mm. Patient was last seen by Dr. Bjorn Elliott in 04/2023 at which time he was stable from a cardiac standpoint. He denied any chest pain or shortness of breath and his lower extremity edema had improved. BP was elevated in the office and he was unsure if he was taking Enalapril at home or not. He was advised to follow-up in our PharmD HTN clinic and bring all of his home mediations with him but it does not look like this ever happened. A chest CTA was also ordered for further evaluation of aortic dilatation but has not been done yet.  Patient presented to the ED via EMS on 08/17/2023 for further evaluation of shortness of breath. Upon EMS arrival, respiratory rate was reported 60/ min and he was ultimately started on BiPAP. Systolic BP was reportedly 200. Upon arrival to the ED, BP had improved to 166/89. EKG showed  normal sinus rhythm with no acute ischemic changes. High-sensitivity troponin was negative x2. BNP was elevated at 14. Chest x-ray showed cardiomegaly with pulmonary vascular congestion. WBC 10.5, Hgb 13.7, Plts 362. Na 138, K 3.6, Glucose 105, BUN 7, Cr 0.80. He was started on IV Lasix and admitted for hypertensive emergency and concern for diastolic CHF.  At the time of this evaluation, patient is on 2 L of O2 via nasal cannula with O2 sats around 94%.   He still looks like he is winded but states he is breathing much better than when he came in.He states he was in his usual state of health until 08/16/2023 when he woke up and just did not feel good.  When I asked him to elaborate on this, he stated that he was feeling more short of breath.  It sounds like this was more dyspnea on exertion.  He denied any trouble breathing later that evening.  No orthopnea or PND.  He denies any chest pain with this.  However, breathing acutely worsened the following day which is when EMS was called.  He is not very active at baseline but states he will do things around the house and run errands without any chest pain or shortness of breath.  He has chronic lower extremity edema which she states has been stable.  He states he was diagnosed with lymphedema.  He denies any palpitations, lightheadedness, dizziness, syncope.  No recent fevers or illnesses.  He has intermittent diarrhea at baseline but no other  GI symptoms.  No abnormal bleeding in urine or stools.  He cannot remember what he takes for his blood pressure but states he is compliant with his medications.   He denies any history of tobacco use. He denies any recent ravel or surgeries to raise concern for PE.  Past Medical History:  Diagnosis Date   Allergy    penicillin & Aspirin   Anxiety 11/04/2013   Arthritis    BCC (basal cell carcinoma), leg    Right calf, L nape of neck at hairline   CAD (coronary artery disease)    CP, cath, non-obstructive    Cataract    been removed   Depression 11/04/2013   Diverticulosis    colon   Erectile dysfunction following radical prostatectomy    Hyperlipidemia    Hypertension    Insomnia    Melanoma (HCC) 2010   face, sees derm    Prostate CA (HCC) 07/2007   s/p robotic surgery   Spermatocele    Left    Past Surgical History:  Procedure Laterality Date   APPENDECTOMY     age 89   EYE SURGERY     cataracts removed   HERNIA REPAIR  2010   JOINT REPLACEMENT  hip 2024   KNEE ARTHROSCOPY Left 1965   meniscal tear   PROSTATECTOMY  07/25/2007    robotic,  for prostate cancer     SHOULDER ARTHROSCOPY Left 07/09/2013   Dr Ave Filter    SPINE SURGERY     TOTAL HIP ARTHROPLASTY Left 12/27/2022   Procedure: LEFT TOTAL HIP ARTHROPLASTY ANTERIOR APPROACH;  Surgeon: Marcene Corning, MD;  Location: [facility];  Service: Orthopedics;  Laterality: Left;   TRANSFORAMINAL LUMBAR INTERBODY FUSION (TLIF) WITH PEDICLE SCREW FIXATION 1 LEVEL Right 12/04/2019   Procedure: RIGHT-SIDED TRANSFORAMINAL LUMBAR INTERBODY FUSION LUMBAR FOUR THROUGH FIVE WITH INSTRUMENTATION AND ALLOGRAFT;  Surgeon: Estill Bamberg, MD;  Location: [facility];  Service: Orthopedics;  Laterality: Right;     Home Medications:  Prior to Admission medications   Medication Sig Start Date End Date Taking? Authorizing Provider  atorvastatin (LIPITOR) 40 MG tablet Take 1 tablet (40 mg total) by mouth at bedtime. Patient taking differently: Take 40 mg by mouth every evening. 07/20/22  Yes Paz, Nolon Rod, MD  donepezil (ARICEPT) 5 MG tablet Take 1 tablet (5 mg total) by mouth at bedtime. 05/08/23  Yes Paz, Nolon Rod, MD  fenofibrate micronized (LOFIBRA) 134 MG capsule Take 1 capsule (134 mg total) by mouth daily before breakfast. 09/29/22  Yes Paz, Nolon Rod, MD  furosemide (LASIX) 20 MG tablet Take 1 tablet (20 mg total) by mouth 2 (two) times daily. 07/25/23  Yes Olive Bass, FNP  gabapentin (NEURONTIN) 100 MG capsule Take 1 capsule (100 mg total) by  mouth 3 (three) times daily. 02/28/23  Yes Lowne Chase, Myrene Buddy R, DO  ondansetron (ZOFRAN) 4 MG tablet TAKE 1 TABLET BY MOUTH EVERY 8 HOURS AS NEEDED FOR NAUSEA OR VOMITING 03/09/23  Yes Paz, Nolon Rod, MD  potassium chloride SA (KLOR-CON M) 20 MEQ tablet Take 1 tablet (20 mEq total) by mouth 2 (two) times daily. 05/23/23  Yes Paz, Nolon Rod, MD  sertraline (ZOLOFT) 50 MG tablet Take 1 tablet (50 mg total) by mouth daily. 02/28/23  Yes Seabron Spates R, DO  docusate sodium (COLACE) 100 MG capsule Take 1 capsule (100 mg total) by mouth 2 (two) times daily as needed for mild constipation. Patient not taking: Reported on 08/18/2023 01/02/23  Rolly Salter, MD  doxycycline (VIBRA-TABS) 100 MG tablet Take 1 tablet (100 mg total) by mouth 2 (two) times daily. Patient not taking: Reported on 08/18/2023 07/25/23   Olive Bass, FNP  melatonin 5 MG TABS Take 1 tablet (5 mg total) by mouth at bedtime. Patient not taking: Reported on 08/18/2023 01/02/23   Rolly Salter, MD    Inpatient Medications: Scheduled Meds:  atorvastatin  40 mg Oral QHS   docusate sodium  100 mg Oral BID   donepezil  5 mg Oral QHS   empagliflozin  10 mg Oral Daily   enoxaparin (LOVENOX) injection  50 mg Subcutaneous Q24H   fenofibrate  160 mg Oral Daily   furosemide  40 mg Intravenous BID   gabapentin  100 mg Oral TID   potassium chloride  40 mEq Oral BID   sertraline  50 mg Oral Daily   Continuous Infusions:  PRN Meds: acetaminophen **OR** acetaminophen, ondansetron **OR** ondansetron (ZOFRAN) IV, polyethylene glycol  Allergies:    Allergies  Allergen Reactions   Ambien [Zolpidem] Other (See Comments)    Encephalopathy, admitted 04-2022, symptoms suspected to be from [place]   Aspirin Swelling    swelling in face   Hydrocodone Other (See Comments)    Insomnia, mild SOB (w/o cough-wheezing)   Penicillins Swelling    swelling in face    Social History:   Social History   Socioeconomic History   Marital  status: Married    Spouse name: Not on file   Number of children: 2   Years of education: Not on file   Highest education level: Some college, no degree  Occupational History   Occupation: retired Emergency planning/management officer   Tobacco Use   Smoking status: Never   Smokeless tobacco: Never  Vaping Use   Vaping status: Never Used  Substance and Sexual Activity   Alcohol use: Not Currently    Alcohol/week: 1.0 standard drink of alcohol    Types: 1 Glasses of wine per week    Comment: fruit glass size   Drug use: Never   Sexual activity: Not on file  Other Topics Concern   Not on file  Social History Narrative   Lives w/ wife    Original from [place]   Social Determinants of Health   Financial Resource Strain: Low Risk  (01/16/2023)   Overall Financial Resource Strain (CARDIA)    Difficulty of Paying Living Expenses: Not hard at all  Food Insecurity: No Food Insecurity (08/17/2023)   Hunger Vital Sign    Worried About Running Out of Food in the Last Year: Never true    Ran Out of Food in the Last Year: Never true  Transportation Needs: No Transportation Needs (08/17/2023)   PRAPARE - Administrator, Civil Service (Medical): No    Lack of Transportation (Non-Medical): No  Physical Activity: Unknown (01/16/2023)   Exercise Vital Sign    Days of Exercise per Week: 0 days    Minutes of Exercise per Session: Not on file  Stress: Stress Concern Present (01/16/2023)   [employer] of Occupational Health - Occupational Stress Questionnaire    Feeling of Stress : Rather much  Social Connections: Moderately Isolated (01/16/2023)   Social Connection and Isolation Panel [NHANES]    Frequency of Communication with Friends and Family: Once a week    Frequency of Social Gatherings with Friends and Family: Once a week    Attends Religious Services: 1 to 4 times per year  Active Member of Clubs or Organizations: No    Attends Banker Meetings: Not on file    Marital  Status: Married  Intimate Partner Violence: Not At Risk (08/17/2023)   Humiliation, Afraid, Rape, and Kick questionnaire    Fear of Current or Ex-Partner: No    Emotionally Abused: No    Physically Abused: No    Sexually Abused: No    Family History:   Family History  Problem Relation Age of Onset   Coronary artery disease Father 58       dx age 61s   Stroke Mother    Hypertension Neg Hx    Diabetes Neg Hx    Colon cancer Neg Hx    Prostate cancer Neg Hx      ROS:  Please see the history of present illness.  Review of Systems  Constitutional:  Negative for fever.  HENT:  Negative for congestion.   Respiratory:  Positive for shortness of breath. Negative for cough.   Cardiovascular:  Positive for leg swelling. Negative for chest pain, palpitations, orthopnea and PND.  Gastrointestinal:  Positive for diarrhea. Negative for blood in stool, melena, nausea and vomiting.  Genitourinary:  Negative for hematuria.  Musculoskeletal:  Negative for myalgias.  Neurological:  Negative for dizziness and loss of consciousness.  Endo/Heme/Allergies:  Does not bruise/bleed easily.  Psychiatric/Behavioral:  Negative for substance abuse.     All other ROS reviewed and negative.     Physical Exam/Data:   Vitals:   08/18/23 1021 08/18/23 1201 08/18/23 1205 08/18/23 1300  BP: (!) 102/55 122/74    Pulse: 67 91 90 87  Resp: 18 20    Temp: (!) 97.5 F (36.4 C) 99 F (37.2 C)    TempSrc: Oral Oral    SpO2: 92% (!) 89% 93% 95%  Weight:      Height:        Intake/Output Summary (Last 24 hours) at 08/18/2023 1328 Last data filed at 08/18/2023 1300 Gross per 24 hour  Intake 360 ml  Output 150 ml  Net 210 ml      08/17/2023    9:25 AM 08/08/2023   10:24 AM 07/25/2023    1:08 PM  Last 3 Weights  Weight (lbs) 250 lb 250 lb 6 oz 253 lb 12.8 oz  Weight (kg) 113.399 kg 113.569 kg 115.123 kg     Body mass index is 35.87 kg/m.  General: 77 y.o. obese Caucasian male resting comfortably  in no acute distress. HEENT: Normocephalic and atraumatic. Sclera clear. EOMs intact. Neck: Supple. No carotid bruits. No JVD. Heart: RRR. Distinct S1 and S2. No murmurs, gallops, or rubs. Radial and distal pedal pulses 2+ and equal bilaterally. Lungs: No increased work of breathing. Clear to ausculation bilaterally. No wheezes, rhonchi, or rales.  Abdomen: Soft, non-distended, and non-tender to palpation. Bowel sounds present in all 4 quadrants.  MSK: Normal strength and tone for age. Extremities: No clubbing, cyanosis, or edema.    Skin: Warm and dry. Neuro: Alert and oriented x3. No focal deficits. Psych: Normal affect. Responds appropriately.   EKG:  The EKG was personally reviewed and demonstrates:  Normal sinus rhythm with no acute ST/ T changes.  Telemetry:  Telemetry was personally reviewed and demonstrates:  Normal sinus rhythm with rates in the 80s to 90s at rest but quickly increases to the 110s with minimal activity.  Relevant CV Studies:  Echocardiogram 03/10/2023: Impressions: 1. Left ventricular ejection fraction, by estimation, is 60 to 65%. The  left ventricle has normal function. The left ventricle has no regional  wall motion abnormalities. Left ventricular diastolic parameters were  normal.   2. Right ventricular systolic function is normal. The right ventricular  size is normal.   3. The mitral valve is normal in structure. No evidence of mitral valve  regurgitation. No evidence of mitral stenosis.   4. The aortic valve is normal in structure. Aortic valve regurgitation is  not visualized. No aortic stenosis is present.   5. The inferior vena cava is normal in size with greater than 50%  respiratory variability, suggesting right atrial pressure of 3 mmHg.    Laboratory Data:  High Sensitivity Troponin:   Recent Labs  Lab 08/17/23 1012 08/17/23 1241  TROPONINIHS 4 5     Chemistry Recent Labs  Lab 08/17/23 1012 08/17/23 1019 08/18/23 0439  NA 138 140  139  K 3.6 3.5 3.3*  CL 105 107 107  CO2 21*  --  20*  GLUCOSE 105* 104* 119*  BUN 7* 7* 12  CREATININE 0.80 0.70 0.88  CALCIUM 8.6*  --  8.5*  GFRNONAA >60  --  >60  ANIONGAP 12  --  12    Recent Labs  Lab 08/18/23 0439  PROT 6.0*  ALBUMIN 3.1*  AST 20  ALT 31  ALKPHOS 61  BILITOT 1.4*   Lipids No results for input(s): "CHOL", "TRIG", "HDL", "LABVLDL", "LDLCALC", "CHOLHDL" in the last 168 hours.  Hematology Recent Labs  Lab 08/17/23 1012 08/17/23 1019 08/18/23 0439  WBC 10.5  --  9.4  RBC 5.09  --  4.90  HGB 13.7 14.3 13.6  HCT 44.4 42.0 42.4  MCV 87.2  --  86.5  MCH 26.9  --  27.8  MCHC 30.9  --  32.1  RDW 14.9  --  15.0  PLT 362  --  368   Thyroid No results for input(s): "TSH", "FREET4" in the last 168 hours.  BNP Recent Labs  Lab 08/17/23 1012  BNP 14.6    DDimer No results for input(s): "DDIMER" in the last 168 hours.   Radiology/Studies:  DG Chest Port 1 View  Result Date: 08/17/2023 CLINICAL DATA:  Shortness of breath. EXAM: PORTABLE CHEST 1 VIEW COMPARISON:  Chest radiograph dated 07/25/2023 FINDINGS: The heart size and mediastinal contours are unchanged. Bibasilar linear atelectasis or scarring. Bilateral interstitial prominence. No pneumothorax or sizable pleural effusion. The visualized skeletal structures are unchanged. IMPRESSION: Cardiomegaly with pulmonary vascular congestion. Electronically Signed   By: Hart Robinsons M.D.   On: 08/17/2023 10:45     Assessment and Plan:   Acute Respiratory Distress Patient presented with acute onset of respiratory distress in the setting of hypertensive emergency. Initially required BiPAP. Chest x-ray shows cardiomegaly and pulmonary vascular congestion but BNP only 14. Echo in 02/2023 showed 60-65% with normal wall motion and diastolic parameters and no significant valvular disease. Patient was started on IV Lasix. However, only 150 cc of urinary output were documented with this. - He has chronic lower  extremity edema likely due to venous insufficiency which he states is stable but otherwise does not appear significantly volume overloaded. However, he still is requiring supplemental O2 and does look winded while talking. - Will repeat limited Echo. - Suspect presentation more of a flash pulmonary edema in setting of hypertensive emergency rather than true diastolic CHF.  - Okay to continue IV Lasix 40mg  twice daily and Jardiance 10mg  daily. - Will start Spironolactone 25mg  daily for hypertension. - I  will also ordered a D-dimer. He denies any recent travel or surgery to suggest PE but he is more sedentary at baseline.  Minimal Non-Obstructive CAD Noted on cardiac catheterization in 2011. High-sensitivity troponin negative x2.  - No chest pain. - Continue statin.   Hypertensive Emergency Systolic BP of 200 in the field. However, BP much improved with diuresis and is now well controlled. He does not have any antihypertensives listed on his PTA medication list. - Will start Spironolactone 25mg  daily.   Hyperlipidemia Continue home Lipitor 40mg  daily and Fenofibrate 160mg  daily.  Hypokalemia Potassium 3.3 today.  - Being repleted by primary team.  Otherwise, per primary team: - Hyperglycemia - Mild cognitive deficits: on Aricept - Anxiety/ depression - Obesity    Risk Assessment/Risk Scores:    New York Heart Association (NYHA) Functional Class NYHA Class I at baseline. Class IV on admission (suspect this was due to flash pulmonary edema in setting of hypertensive emergency)   For questions or updates, please contact [facility] Please consult [email or web address] for contact info under    Signed, Corrin Parker, PA-C  08/18/2023 1:28 PM   Patient seen and examined.  Agree with the documentation.  Larry Elliott is a 77 year old male with a history of nonobstructive CAD, hypertension, chronic lower extremity edema, melanoma, prostate cancer who we are consulted by Dr.  Jomarie Longs for evaluation of heart failure.  Most recent echo 02/2023 showed EF 60 to 65%, normal diastolic function, no significant valvular disease.  He was in his usual state of health until yesterday morning felt short of breath, prompting him to call EMS.  On arrival, he was very tachypneic and was started on BiPAP.  Initial vitals with SBP over 200.  Upon arrival to the ED, initial vital signs notable for BP 166/89, pulse 78, SpO2 100% on BiPAP.  Labs notable for creatinine 0.8, BNP 15, troponin 4 > 5, WBC 10.5, hemoglobin 13.7, platelets 362, chest x-ray with cardiomegaly with pulmonary vascular congestion.  EKG shows sinus rhythm, rate 84, low voltage, no ST abnormalities.  On exam, patient is alert, regular rate and rhythm, no murmurs, diminished breath sounds, 1+ lower extremity edema.  Suspect his presentation likely represents flash pulmonary edema in the setting of hypertensive emergency, SBP over 200 initially.  He is not currently on any antihypertensives, will add spironolactone 25 mg daily.  Can continue IV Lasix today, suspect can transition to p.o. Lasix tomorrow  Little Ishikawa, MD

## 2023-08-18 NOTE — ED Notes (Signed)
Pt transitioned from ED stretcher to hospital bed. Call bell in reach. Side rails up x3.

## 2023-08-18 NOTE — TOC CM/SW Note (Signed)
Received notification [facility], pt is active with [facility] RN.

## 2023-08-18 NOTE — Progress Notes (Signed)
PROGRESS NOTE    Larry Elliott  GEX:[id number] DOB: 1946-08-14 DOA: 08/17/2023 PCP: Wanda Plump, MD  77/M with history of CAD, chronic lower extremity edema, dyslipidemia, obesity, remote history of prostate cancer presented to the ED with acute onset respiratory distress 10/24. -Poor historian, reports chronic lower extremity edema for which she is on Lasix, last week he noticed some shortness of breath with activity, acutely worse with distress the morning of admission.  Poor historian -In the ED tachypneic in respiratory distress placed on BiPAP.  Labs noted sodium 138, creatinine 0.8, troponin 4, BNP 14, hemoglobin 13.7, chest x-ray with cardiomegaly and pulmonary vascular congestion  Subjective: -Breathing a little better, still having some shortness of breath  Assessment and Plan:  Acute diastolic CHF 2D echo 5/24 with preserved EF, normal RV -At baseline has chronic lower extremity edema, now presenting with respiratory distress, volume overload, hypertensive urgency -Required BiPAP on admission yesterday, now off -Continue IV Lasix today, add Jardiance, will request cardiology input -Follow-up echo  Anxiety, depression -Continue Zoloft  Mild cognitive deficits, -Continue Aricept  Hyperglycemia Check HbA1c  Obesity -BMI is 35.8,   Dyslipidemia -Continue statin and fenofibrate   DVT prophylaxis: Lovenox Code Status: DNR Family Communication: No family at bedside, will update spouse Disposition Plan: Home likely 1 to 2 days  Consultants:    Procedures:   Antimicrobials:    Objective: Vitals:   08/18/23 0730 08/18/23 0800 08/18/23 0900 08/18/23 1021  BP:  116/70  (!) 102/55  Pulse: 91 72 91 67  Resp: 13 (!) 21 17 18   Temp:  98.1 F (36.7 C)  (!) 97.5 F (36.4 C)  TempSrc:  Oral  Oral  SpO2: 96% 93% 96% 92%  Weight:      Height:        Intake/Output Summary (Last 24 hours) at 08/18/2023 1123 Last data filed at 08/18/2023 0554 Gross per 24 hour   Intake --  Output 150 ml  Net -150 ml   Filed Weights   08/17/23 0925  Weight: 113.4 kg    Examination:  General exam: AAOx2, mild cognitive deficits HEENT: + JVD CVS: S1S2/RRR Lungs: decreased Bs at bases Abd: soft, NT, BS present Ext: 1 plus edema Skin: No rashes Psychiatry: flat affect, poor insight  Data Reviewed:   CBC: Recent Labs  Lab 08/17/23 1012 08/17/23 1019 08/18/23 0439  WBC 10.5  --  9.4  NEUTROABS 8.2*  --   --   HGB 13.7 14.3 13.6  HCT 44.4 42.0 42.4  MCV 87.2  --  86.5  PLT 362  --  368   Basic Metabolic Panel: Recent Labs  Lab 08/17/23 1012 08/17/23 1019 08/18/23 0439  NA 138 140 139  K 3.6 3.5 3.3*  CL 105 107 107  CO2 21*  --  20*  GLUCOSE 105* 104* 119*  BUN 7* 7* 12  CREATININE 0.80 0.70 0.88  CALCIUM 8.6*  --  8.5*   GFR: Estimated Creatinine Clearance: 85.9 mL/min (by C-G formula based on SCr of 0.88 mg/dL). Liver Function Tests: Recent Labs  Lab 08/18/23 0439  AST 20  ALT 31  ALKPHOS 61  BILITOT 1.4*  PROT 6.0*  ALBUMIN 3.1*   No results for input(s): "LIPASE", "AMYLASE" in the last 168 hours. No results for input(s): "AMMONIA" in the last 168 hours. Coagulation Profile: No results for input(s): "INR", "PROTIME" in the last 168 hours. Cardiac Enzymes: No results for input(s): "CKTOTAL", "CKMB", "CKMBINDEX", "TROPONINI" in the last 168 hours.  BNP (last 3 results) Recent Labs    01/09/23 1436 07/25/23 1337  PROBNP 14.0 12.0   HbA1C: No results for input(s): "HGBA1C" in the last 72 hours. CBG: No results for input(s): "GLUCAP" in the last 168 hours. Lipid Profile: No results for input(s): "CHOL", "HDL", "LDLCALC", "TRIG", "CHOLHDL", "LDLDIRECT" in the last 72 hours. Thyroid Function Tests: No results for input(s): "TSH", "T4TOTAL", "FREET4", "T3FREE", "THYROIDAB" in the last 72 hours. Anemia Panel: No results for input(s): "VITAMINB12", "FOLATE", "FERRITIN", "TIBC", "IRON", "RETICCTPCT" in the last 72  hours. Urine analysis:    Component Value Date/Time   COLORURINE YELLOW 12/28/2022 2159   APPEARANCEUR CLOUDY (A) 12/28/2022 2159   LABSPEC 1.021 12/28/2022 2159   PHURINE 5.0 12/28/2022 2159   GLUCOSEU NEGATIVE 12/28/2022 2159   GLUCOSEU NEGATIVE 10/08/2014 1055   HGBUR NEGATIVE 12/28/2022 2159   BILIRUBINUR negative 02/17/2023 1449   KETONESUR NEGATIVE 12/28/2022 2159   PROTEINUR Positive (A) 02/17/2023 1449   PROTEINUR NEGATIVE 12/28/2022 2159   UROBILINOGEN 0.2 02/17/2023 1449   UROBILINOGEN 0.2 10/08/2014 1055   NITRITE negative 02/17/2023 1449   NITRITE NEGATIVE 12/28/2022 2159   LEUKOCYTESUR Negative 02/17/2023 1449   LEUKOCYTESUR NEGATIVE 12/28/2022 2159   Sepsis Labs: @LABRCNTIP (procalcitonin:4,lacticidven:4)  )No results found for this or any previous visit (from the past 240 hour(s)).   Radiology Studies: DG Chest Port 1 View  Result Date: 08/17/2023 CLINICAL DATA:  Shortness of breath. EXAM: PORTABLE CHEST 1 VIEW COMPARISON:  Chest radiograph dated 07/25/2023 FINDINGS: The heart size and mediastinal contours are unchanged. Bibasilar linear atelectasis or scarring. Bilateral interstitial prominence. No pneumothorax or sizable pleural effusion. The visualized skeletal structures are unchanged. IMPRESSION: Cardiomegaly with pulmonary vascular congestion. Electronically Signed   By: Hart Robinsons M.D.   On: 08/17/2023 10:45     Scheduled Meds:  atorvastatin  40 mg Oral QHS   docusate sodium  100 mg Oral BID   donepezil  5 mg Oral QHS   empagliflozin  10 mg Oral Daily   enoxaparin (LOVENOX) injection  50 mg Subcutaneous Q24H   fenofibrate  160 mg Oral Daily   furosemide  40 mg Intravenous BID   gabapentin  100 mg Oral TID   sertraline  50 mg Oral Daily   Continuous Infusions:   LOS: 1 day    Time spent:    Zannie Cove, MD [facility]   08/18/2023, 11:23 AM

## 2023-08-18 NOTE — ED Notes (Signed)
Patient daughter (tracy) called for a update.

## 2023-08-19 DIAGNOSIS — I161 Hypertensive emergency: Secondary | ICD-10-CM | POA: Diagnosis not present

## 2023-08-19 DIAGNOSIS — I5033 Acute on chronic diastolic (congestive) heart failure: Secondary | ICD-10-CM | POA: Diagnosis not present

## 2023-08-19 LAB — BASIC METABOLIC PANEL
Anion gap: 9 (ref 5–15)
BUN: 16 mg/dL (ref 8–23)
CO2: 26 mmol/L (ref 22–32)
Calcium: 9.5 mg/dL (ref 8.9–10.3)
Chloride: 104 mmol/L (ref 98–111)
Creatinine, Ser: 0.93 mg/dL (ref 0.61–1.24)
GFR, Estimated: >60 mL/min (ref >60)
Glucose, Bld: 113 mg/dL — ABNORMAL HIGH (ref 70–99)
Potassium: 3.5 mmol/L (ref 3.5–5.1)
Sodium: 139 mmol/L (ref 135–145)

## 2023-08-19 LAB — CBC
HCT: 42.5 % (ref 39.0–52.0)
Hemoglobin: 14 g/dL (ref 13.0–17.0)
MCH: 27.2 pg (ref 26.0–34.0)
MCHC: 32.9 g/dL (ref 30.0–36.0)
MCV: 82.7 fL (ref 80.0–100.0)
Platelets: 381 10*3/uL (ref 150–400)
RBC: 5.14 MIL/uL (ref 4.22–5.81)
RDW: 14.8 % (ref 11.5–15.5)
WBC: 9 10*3/uL (ref 4.0–10.5)
nRBC: 0 % (ref 0.0–0.2)

## 2023-08-19 LAB — MAGNESIUM: Magnesium: 2.3 mg/dL (ref 1.7–2.4)

## 2023-08-19 MED ORDER — FUROSEMIDE 40 MG PO TABS
40.0000 mg | ORAL_TABLET | Freq: Every day | ORAL | Status: DC
  Administered 2023-08-20: 40 mg via ORAL
  Filled 2023-08-19: qty 1

## 2023-08-19 MED ORDER — POTASSIUM CHLORIDE CRYS ER 20 MEQ PO TBCR
20.0000 meq | EXTENDED_RELEASE_TABLET | Freq: Once | ORAL | Status: AC
  Administered 2023-08-19: 20 meq via ORAL
  Filled 2023-08-19: qty 1

## 2023-08-19 NOTE — Plan of Care (Signed)
Pt in sinus rhythm at rate within normal limits

## 2023-08-19 NOTE — Progress Notes (Addendum)
Rounding Note    Patient Name: Larry Elliott Date of Encounter: 08/19/2023  [facility] Cardiologist: Little Ishikawa, MD   Subjective   BP 141/66.  Incomplete I/os.  Creatinine stable at 0.9.  Weight recorded is down 10 pounds from admission.  Reports dyspnea improved  Inpatient Medications    Scheduled Meds:  atorvastatin  40 mg Oral QHS   docusate sodium  100 mg Oral BID   donepezil  5 mg Oral QHS   empagliflozin  10 mg Oral Daily   enoxaparin (LOVENOX) injection  50 mg Subcutaneous Q24H   fenofibrate  160 mg Oral Daily   furosemide  40 mg Intravenous BID   gabapentin  100 mg Oral TID   sertraline  50 mg Oral Daily   spironolactone  25 mg Oral Daily   Continuous Infusions:  PRN Meds: acetaminophen **OR** acetaminophen, loperamide, ondansetron **OR** ondansetron (ZOFRAN) IV, polyethylene glycol   Vital Signs    Vitals:   08/19/23 0431 08/19/23 0722 08/19/23 0754 08/19/23 0903  BP: (!) 147/64 134/69 (!) 168/88 (!) 141/66  Pulse: 76 76 89 93  Resp: 18 18    Temp: 97.7 F (36.5 C) 97.7 F (36.5 C) 98.7 F (37.1 C)   TempSrc: Oral Oral Oral   SpO2: 95% 93% 97% 94%  Weight: 109.1 kg     Height:        Intake/Output Summary (Last 24 hours) at 08/19/2023 1003 Last data filed at 08/19/2023 0902 Gross per 24 hour  Intake 480 ml  Output --  Net 480 ml      08/19/2023    4:31 AM 08/17/2023    9:25 AM 08/08/2023   10:24 AM  Last 3 Weights  Weight (lbs) 240 lb 8.4 oz 250 lb 250 lb 6 oz  Weight (kg) 109.1 kg 113.399 kg 113.569 kg      Telemetry    Normal sinus rhythm- Personally Reviewed  ECG    No new ECG - Personally Reviewed  Physical Exam   GEN: No acute distress.   Neck: No JVD Cardiac: RRR, no murmurs, rubs, or gallops.  Respiratory: Clear to auscultation bilaterally. GI: Soft, nontender, non-distended  MS: 1+ edema Neuro:  Nonfocal  Psych: Normal affect   Labs    High Sensitivity Troponin:   Recent Labs  Lab  08/17/23 1012 08/17/23 1241  TROPONINIHS 4 5     Chemistry Recent Labs  Lab 08/17/23 1012 08/17/23 1019 08/18/23 0439 08/19/23 0257  NA 138 140 139 139  K 3.6 3.5 3.3* 3.5  CL 105 107 107 104  CO2 21*  --  20* 26  GLUCOSE 105* 104* 119* 113*  BUN 7* 7* 12 16  CREATININE 0.80 0.70 0.88 0.93  CALCIUM 8.6*  --  8.5* 9.5  MG  --   --   --  2.3  PROT  --   --  6.0*  --   ALBUMIN  --   --  3.1*  --   AST  --   --  20  --   ALT  --   --  31  --   ALKPHOS  --   --  61  --   BILITOT  --   --  1.4*  --   GFRNONAA >60  --  >60 >60  ANIONGAP 12  --  12 9    Lipids No results for input(s): "CHOL", "TRIG", "HDL", "LABVLDL", "LDLCALC", "CHOLHDL" in the last 168 hours.  Hematology  Recent Labs  Lab 08/17/23 1012 08/17/23 1019 08/18/23 0439 08/19/23 0257  WBC 10.5  --  9.4 9.0  RBC 5.09  --  4.90 5.14  HGB 13.7 14.3 13.6 14.0  HCT 44.4 42.0 42.4 42.5  MCV 87.2  --  86.5 82.7  MCH 26.9  --  27.8 27.2  MCHC 30.9  --  32.1 32.9  RDW 14.9  --  15.0 14.8  PLT 362  --  368 381   Thyroid No results for input(s): "TSH", "FREET4" in the last 168 hours.  BNP Recent Labs  Lab 08/17/23 1012  BNP 14.6    DDimer  Recent Labs  Lab 08/18/23 1423  DDIMER 0.53*     Radiology    ECHOCARDIOGRAM LIMITED  Result Date: 08/18/2023    ECHOCARDIOGRAM LIMITED REPORT   Patient Name:   Larry Elliott Date of Exam: 08/18/2023 Medical Rec #:  [id number]        Height:       70.0 in Accession #:    [id number]       Weight:       250.0 lb Date of Birth:  May 16, 1946        BSA:          2.295 m Patient Age:    77 years         BP:           144/81 mmHg Patient Gender: M                HR:           81 bpm. Exam Location:  Inpatient Procedure: Color Doppler, Cardiac Doppler and Limited Echo Indications:    dyspnea  History:        Patient has prior history of Echocardiogram examinations, most                 recent 03/10/2023. CAD, Signs/Symptoms:Edema; Risk                 Factors:Hypertension and  Dyslipidemia.  Sonographer:    Delcie Roch RDCS Referring Phys: [id number] CALLIE E GOODRICH IMPRESSIONS  1. LIMITED ECHO.  2. Left ventricular ejection fraction, by estimation, is 60 to 65%. The left ventricle has normal function. The left ventricle has no regional wall motion abnormalities.  3. Right ventricular systolic function grossly normal. The right ventricular size is grossly normal.  4. The mitral valve is grossly normal. No evidence of mitral valve regurgitation. No evidence of mitral stenosis.  5. The aortic valve is grossly normal. Aortic valve regurgitation is not visualized. No aortic stenosis is present.  6. Aortic proximal ascending aorta normal.  7. The inferior vena cava is normal in size with greater than 50% respiratory variability, suggesting right atrial pressure of 3 mmHg. FINDINGS  Left Ventricle: Left ventricular ejection fraction, by estimation, is 60 to 65%. The left ventricle has normal function. The left ventricle has no regional wall motion abnormalities. Right Ventricle: The right ventricular size is grossly normal. Right ventricular systolic function grossly normal. Pericardium: There is no evidence of pericardial effusion. Mitral Valve: The mitral valve is grossly normal. No evidence of mitral valve stenosis. Tricuspid Valve: The tricuspid valve is grossly normal. Tricuspid valve regurgitation is not demonstrated. No evidence of tricuspid stenosis. Aortic Valve: The aortic valve is grossly normal. Aortic valve regurgitation is not visualized. No aortic stenosis is present. Pulmonic Valve: The pulmonic valve was not well visualized. Pulmonic valve regurgitation is not visualized.  No evidence of pulmonic stenosis. Aorta: Proximal ascending aorta normal and the aortic root was not well visualized. Venous: The inferior vena cava is normal in size with greater than 50% respiratory variability, suggesting right atrial pressure of 3 mmHg. Additional Comments: Spectral Doppler performed.  Color Doppler performed.   Diastology LV e' medial:    7.72 cm/s LV E/e' medial:  7.5 LV e' lateral:   7.94 cm/s LV E/e' lateral: 7.3  AORTIC VALVE LVOT Vmax:   111.00 cm/s LVOT Vmean:  72.600 cm/s LVOT VTI:    0.208 m  AORTA Ao Asc diam: 3.50 cm MITRAL VALVE MV Area (PHT): 3.31 cm    SHUNTS MV Decel Time: 229 msec    Systemic VTI: 0.21 m MV E velocity: 58.00 cm/s MV A velocity: 91.00 cm/s MV E/A ratio:  0.64 Sunit Tolia Electronically signed by Tessa Lerner Signature Date/Time: 08/18/2023/7:09:20 PM    Final     Cardiac Studies     Patient Profile     77 y.o. male with a history of minimal non-obstructive CAD on cardiac catheterization in 06/2010, ascending thoracic aortic aneurysm, hypertension, hyperlipidemia, chronic lower extremity edema, prostate cancer, and melanoma who is being seen 08/18/2023 for concerns of CHF   Assessment & Plan    Acute Respiratory Distress Patient presented with acute onset of respiratory distress in the setting of hypertensive emergency. Initially required BiPAP. Chest x-ray shows cardiomegaly and pulmonary vascular congestion but BNP only 14. Echo in 02/2023 showed 60-65% with normal wall motion and diastolic parameters and no significant valvular disease.  Age-adjusted D-dimer negative.  Patient was started on IV Lasix.  -Echo shows EF 60 to 65%, normal RV function, no significant valvular disease, RAP 3 - Suspect presentation more of a flash pulmonary edema in setting of hypertensive emergency rather than true diastolic CHF.  - Started Spironolactone 25mg  daily for hypertension. -Appears euvolemic except for chronic LE edema, received IV Lasix this morning, plan to transition to p.o. Lasix tomorrow   Minimal Non-Obstructive CAD Noted on cardiac catheterization in 2011. High-sensitivity troponin negative x2.  - No chest pain. - Continue statin.    Hypertensive Emergency Systolic BP of 200 in the field. However, BP much improved with diuresis and is now well  controlled. He does not have any antihypertensives listed on his PTA medication list. -Started spironolactone 25mg  daily.    Hyperlipidemia Continue home Lipitor 40mg  daily and Fenofibrate 160mg  daily.   Hypokalemia - repleted by primary team.   Otherwise, per primary team: - Hyperglycemia - Mild cognitive deficits: on Aricept - Anxiety/ depression - Obesity  For questions or updates, please contact [facility] Please consult [email or web address] for contact info under        Signed, Little Ishikawa, MD  08/19/2023, 10:03 AM

## 2023-08-19 NOTE — Progress Notes (Signed)
Mobility Specialist Progress Note    08/19/23 1048  Mobility  Activity Ambulated independently in hallway  Level of Assistance Standby assist, set-up cues, supervision of patient - no hands on  Assistive Device None  Distance Ambulated (ft) 250 ft  Activity Response Tolerated well  Mobility Referral Yes  $Mobility charge 1 Mobility  Mobility Specialist Start Time (ACUTE ONLY) 1038  Mobility Specialist Stop Time (ACUTE ONLY) 1047  Mobility Specialist Time Calculation (min) (ACUTE ONLY) 9 min   Pre-Mobility: 74 HR, 96% SpO2 During Mobility: 96 HR Post-Mobility: 85 HR  Pt received in bed and agreeable. No complaints on walk. Returned to sitting EOB with call bell in reach.   [place] Mobility Specialist  Please Neurosurgeon or Rehab Office at [phone]

## 2023-08-19 NOTE — Progress Notes (Signed)
PROGRESS NOTE    Larry VLIET  Elliott:[id number] DOB: 1946-04-08 DOA: 08/17/2023 PCP: Wanda Plump, MD  77/M with history of CAD, chronic lower extremity edema, dyslipidemia, obesity, remote history of prostate cancer presented to the ED with acute onset respiratory distress 10/24. -Poor historian, reports chronic lower extremity edema for which she is on Lasix, last week he noticed some shortness of breath with activity, acutely worse with distress the morning of admission.  Poor historian -In the ED tachypneic in respiratory distress placed on BiPAP.  Labs noted sodium 138, creatinine 0.8, troponin 4, BNP 14, hemoglobin 13.7, chest x-ray with cardiomegaly and pulmonary vascular congestion  Subjective: -Feels much better, breathing back to normal  Assessment and Plan:  Acute diastolic CHF 2D echo 5/24 with preserved EF, normal RV -At baseline has chronic lower extremity edema, presented with respiratory distress, volume overload, hypertensive urgency, required BiPAP initially -Repeat echo with EF 60-65, normal RV -Volume status has improved, weight down 10 LB, now on Aldactone, got dose of IV Lasix this morning, changed to oral diuretics -Discharge planning, BMP in a.m., home tomorrow if stable  Anxiety, depression -Continue Zoloft  Mild cognitive deficits, -Continue Aricept  Hyperglycemia A1c 6.0  Obesity -BMI is 35.8,   Dyslipidemia -Continue statin and fenofibrate   DVT prophylaxis: Lovenox Code Status: DNR Family Communication: No family at bedside, will update spouse Disposition Plan: Home likely tomorrow  Consultants:    Procedures:   Antimicrobials:    Objective: Vitals:   08/19/23 0431 08/19/23 0722 08/19/23 0754 08/19/23 0903  BP: (!) 147/64 134/69 (!) 168/88 (!) 141/66  Pulse: 76 76 89 93  Resp: 18 18    Temp: 97.7 F (36.5 C) 97.7 F (36.5 C) 98.7 F (37.1 C)   TempSrc: Oral Oral Oral   SpO2: 95% 93% 97% 94%  Weight: 109.1 kg     Height:         Intake/Output Summary (Last 24 hours) at 08/19/2023 1056 Last data filed at 08/19/2023 0902 Gross per 24 hour  Intake 480 ml  Output --  Net 480 ml   Filed Weights   08/17/23 0925 08/19/23 0431  Weight: 113.4 kg 109.1 kg    Examination:  Gen: Awake, Alert, Oriented X 2, mild cognitive deficits HEENT: no JVD Lungs: Good air movement bilaterally, CTAB CVS: S1S2/RRR Abd: soft, Non tender, non distended, BS present Extremities: No edema Skin: no new rashes on exposed skin  Psychiatry: flat affect, poor insight  Data Reviewed:   CBC: Recent Labs  Lab 08/17/23 1012 08/17/23 1019 08/18/23 0439 08/19/23 0257  WBC 10.5  --  9.4 9.0  NEUTROABS 8.2*  --   --   --   HGB 13.7 14.3 13.6 14.0  HCT 44.4 42.0 42.4 42.5  MCV 87.2  --  86.5 82.7  PLT 362  --  368 381   Basic Metabolic Panel: Recent Labs  Lab 08/17/23 1012 08/17/23 1019 08/18/23 0439 08/19/23 0257  NA 138 140 139 139  K 3.6 3.5 3.3* 3.5  CL 105 107 107 104  CO2 21*  --  20* 26  GLUCOSE 105* 104* 119* 113*  BUN 7* 7* 12 16  CREATININE 0.80 0.70 0.88 0.93  CALCIUM 8.6*  --  8.5* 9.5  MG  --   --   --  2.3   GFR: Estimated Creatinine Clearance: 79.6 mL/min (by C-G formula based on SCr of 0.93 mg/dL). Liver Function Tests: Recent Labs  Lab 08/18/23 0439  AST 20  ALT 31  ALKPHOS 61  BILITOT 1.4*  PROT 6.0*  ALBUMIN 3.1*   No results for input(s): "LIPASE", "AMYLASE" in the last 168 hours. No results for input(s): "AMMONIA" in the last 168 hours. Coagulation Profile: No results for input(s): "INR", "PROTIME" in the last 168 hours. Cardiac Enzymes: No results for input(s): "CKTOTAL", "CKMB", "CKMBINDEX", "TROPONINI" in the last 168 hours. BNP (last 3 results) Recent Labs    01/09/23 1436 07/25/23 1337  PROBNP 14.0 12.0   HbA1C: Recent Labs    08/18/23 0439  HGBA1C 6.0*   CBG: No results for input(s): "GLUCAP" in the last 168 hours. Lipid Profile: No results for input(s): "CHOL",  "HDL", "LDLCALC", "TRIG", "CHOLHDL", "LDLDIRECT" in the last 72 hours. Thyroid Function Tests: No results for input(s): "TSH", "T4TOTAL", "FREET4", "T3FREE", "THYROIDAB" in the last 72 hours. Anemia Panel: No results for input(s): "VITAMINB12", "FOLATE", "FERRITIN", "TIBC", "IRON", "RETICCTPCT" in the last 72 hours. Urine analysis:    Component Value Date/Time   COLORURINE YELLOW 12/28/2022 2159   APPEARANCEUR CLOUDY (A) 12/28/2022 2159   LABSPEC 1.021 12/28/2022 2159   PHURINE 5.0 12/28/2022 2159   GLUCOSEU NEGATIVE 12/28/2022 2159   GLUCOSEU NEGATIVE 10/08/2014 1055   HGBUR NEGATIVE 12/28/2022 2159   BILIRUBINUR negative 02/17/2023 1449   KETONESUR NEGATIVE 12/28/2022 2159   PROTEINUR Positive (A) 02/17/2023 1449   PROTEINUR NEGATIVE 12/28/2022 2159   UROBILINOGEN 0.2 02/17/2023 1449   UROBILINOGEN 0.2 10/08/2014 1055   NITRITE negative 02/17/2023 1449   NITRITE NEGATIVE 12/28/2022 2159   LEUKOCYTESUR Negative 02/17/2023 1449   LEUKOCYTESUR NEGATIVE 12/28/2022 2159   Sepsis Labs: @LABRCNTIP (procalcitonin:4,lacticidven:4)  )No results found for this or any previous visit (from the past 240 hour(s)).   Radiology Studies: ECHOCARDIOGRAM LIMITED  Result Date: 08/18/2023    ECHOCARDIOGRAM LIMITED REPORT   Patient Name:   Larry Elliott Date of Exam: 08/18/2023 Medical Rec #:  [id number]        Height:       70.0 in Accession #:    [id number]       Weight:       250.0 lb Date of Birth:  [birth year]        BSA:          2.295 m Patient Age:    77 years         BP:           144/81 mmHg Patient Gender: M                HR:           81 bpm. Exam Location:  Inpatient Procedure: Color Doppler, Cardiac Doppler and Limited Echo Indications:    dyspnea  History:        Patient has prior history of Echocardiogram examinations, most                 recent 03/10/2023. CAD, Signs/Symptoms:Edema; Risk                 Factors:Hypertension and Dyslipidemia.  Sonographer:    Delcie Roch RDCS  Referring Phys: [id number] CALLIE E GOODRICH IMPRESSIONS  1. LIMITED ECHO.  2. Left ventricular ejection fraction, by estimation, is 60 to 65%. The left ventricle has normal function. The left ventricle has no regional wall motion abnormalities.  3. Right ventricular systolic function grossly normal. The right ventricular size is grossly normal.  4. The mitral valve is grossly normal. No evidence of mitral valve regurgitation. No evidence of mitral  stenosis.  5. The aortic valve is grossly normal. Aortic valve regurgitation is not visualized. No aortic stenosis is present.  6. Aortic proximal ascending aorta normal.  7. The inferior vena cava is normal in size with greater than 50% respiratory variability, suggesting right atrial pressure of 3 mmHg. FINDINGS  Left Ventricle: Left ventricular ejection fraction, by estimation, is 60 to 65%. The left ventricle has normal function. The left ventricle has no regional wall motion abnormalities. Right Ventricle: The right ventricular size is grossly normal. Right ventricular systolic function grossly normal. Pericardium: There is no evidence of pericardial effusion. Mitral Valve: The mitral valve is grossly normal. No evidence of mitral valve stenosis. Tricuspid Valve: The tricuspid valve is grossly normal. Tricuspid valve regurgitation is not demonstrated. No evidence of tricuspid stenosis. Aortic Valve: The aortic valve is grossly normal. Aortic valve regurgitation is not visualized. No aortic stenosis is present. Pulmonic Valve: The pulmonic valve was not well visualized. Pulmonic valve regurgitation is not visualized. No evidence of pulmonic stenosis. Aorta: Proximal ascending aorta normal and the aortic root was not well visualized. Venous: The inferior vena cava is normal in size with greater than 50% respiratory variability, suggesting right atrial pressure of 3 mmHg. Additional Comments: Spectral Doppler performed. Color Doppler performed.   Diastology LV e' medial:     7.72 cm/s LV E/e' medial:  7.5 LV e' lateral:   7.94 cm/s LV E/e' lateral: 7.3  AORTIC VALVE LVOT Vmax:   111.00 cm/s LVOT Vmean:  72.600 cm/s LVOT VTI:    0.208 m  AORTA Ao Asc diam: 3.50 cm MITRAL VALVE MV Area (PHT): 3.31 cm    SHUNTS MV Decel Time: 229 msec    Systemic VTI: 0.21 m MV E velocity: 58.00 cm/s MV A velocity: 91.00 cm/s MV E/A ratio:  0.64 Sunit Tolia Electronically signed by [employer] Signature Date/Time: 08/18/2023/7:09:20 PM    Final      Scheduled Meds:  atorvastatin  40 mg Oral QHS   docusate sodium  100 mg Oral BID   donepezil  5 mg Oral QHS   empagliflozin  10 mg Oral Daily   enoxaparin (LOVENOX) injection  50 mg Subcutaneous Q24H   fenofibrate  160 mg Oral Daily   [START ON 08/20/2023] furosemide  40 mg Oral Daily   gabapentin  100 mg Oral TID   potassium chloride  20 mEq Oral Once   sertraline  50 mg Oral Daily   spironolactone  25 mg Oral Daily   Continuous Infusions:   LOS: 2 days    Time spent:    Zannie Cove, MD [facility]   08/19/2023, 10:56 AM

## 2023-08-19 NOTE — Plan of Care (Signed)
Larry Elliott

## 2023-08-20 DIAGNOSIS — I161 Hypertensive emergency: Secondary | ICD-10-CM | POA: Diagnosis not present

## 2023-08-20 DIAGNOSIS — I5033 Acute on chronic diastolic (congestive) heart failure: Secondary | ICD-10-CM | POA: Diagnosis not present

## 2023-08-20 LAB — BASIC METABOLIC PANEL
Anion gap: 10 (ref 5–15)
BUN: 19 mg/dL (ref 8–23)
CO2: 25 mmol/L (ref 22–32)
Calcium: 8.9 mg/dL (ref 8.9–10.3)
Chloride: 103 mmol/L (ref 98–111)
Creatinine, Ser: 0.95 mg/dL (ref 0.61–1.24)
GFR, Estimated: >60 mL/min (ref >60)
Glucose, Bld: 111 mg/dL — ABNORMAL HIGH (ref 70–99)
Potassium: 3.5 mmol/L (ref 3.5–5.1)
Sodium: 138 mmol/L (ref 135–145)

## 2023-08-20 LAB — MAGNESIUM: Magnesium: 2.3 mg/dL (ref 1.7–2.4)

## 2023-08-20 MED ORDER — POTASSIUM CHLORIDE CRYS ER 20 MEQ PO TBCR: 20.0000 meq | EXTENDED_RELEASE_TABLET | Freq: Every day | ORAL | Status: DC

## 2023-08-20 MED ORDER — EMPAGLIFLOZIN 10 MG PO TABS: 10.0000 mg | ORAL_TABLET | Freq: Every day | ORAL | 0 refills | Status: DC

## 2023-08-20 MED ORDER — FUROSEMIDE 40 MG PO TABS: 40.0000 mg | ORAL_TABLET | Freq: Every day | ORAL | 0 refills | Status: DC

## 2023-08-20 MED ORDER — SPIRONOLACTONE 25 MG PO TABS: 25.0000 mg | ORAL_TABLET | Freq: Every day | ORAL | 0 refills | Status: DC

## 2023-08-20 NOTE — TOC Transition Note (Signed)
Transition of Care [facility]) - CM/SW Discharge Note   Patient Details  Name: Larry Elliott MRN: [id number] Date of Birth: [birth year]  Transition of [facility]) CM/SW Contact:  Lawerance Sabal, RN Phone Number: 08/20/2023, 10:38 AM   Clinical Narrative:     Notified [facility] that patient will discharge home today   Final next level of care: Home w Home Health Services Barriers to Discharge: No Barriers Identified   Patient Goals and CMS Choice      Discharge Placement                         Discharge Plan and Services Additional resources added to the After Visit Summary for                            [facility] Arranged: RN [facility] Agency: [facility] Care Date [facility] Agency Contacted: 08/20/23 Time HH Agency Contacted: 1038 Representative spoke with at [facility] Agency: [place] [facility]  Social Determinants of Health (SDOH) Interventions SDOH Screenings   Food Insecurity: No Food Insecurity (08/17/2023)  Housing: Low Risk  (08/17/2023)  Transportation Needs: No Transportation Needs (08/17/2023)  Utilities: Not At Risk (08/17/2023)  Alcohol Screen: Low Risk  (04/17/2023)  Depression (PHQ2-9): Low Risk  (08/08/2023)  Financial Resource Strain: Low Risk  (01/16/2023)  Physical Activity: Unknown (01/16/2023)  Social Connections: Moderately Isolated (01/16/2023)  Stress: Stress Concern Present (01/16/2023)  Tobacco Use: Low Risk  (08/17/2023)     Readmission Risk Interventions     No data to display

## 2023-08-20 NOTE — Progress Notes (Signed)
Rounding Note    Patient Name: Larry Elliott Date of Encounter: 08/20/2023  [facility] Cardiologist: Little Ishikawa, MD   Subjective   BP 133/63.  Incomplete I/os.  Creatinine stable at 0.95.  Weight recorded is down 10 pounds from admission.  Denies any distal  Inpatient Medications    Scheduled Meds:  atorvastatin  40 mg Oral QHS   docusate sodium  100 mg Oral BID   donepezil  5 mg Oral QHS   empagliflozin  10 mg Oral Daily   enoxaparin (LOVENOX) injection  50 mg Subcutaneous Q24H   fenofibrate  160 mg Oral Daily   furosemide  40 mg Oral Daily   gabapentin  100 mg Oral TID   sertraline  50 mg Oral Daily   spironolactone  25 mg Oral Daily   Continuous Infusions:  PRN Meds: acetaminophen **OR** acetaminophen, loperamide, ondansetron **OR** ondansetron (ZOFRAN) IV, polyethylene glycol   Vital Signs    Vitals:   08/20/23 0055 08/20/23 0500 08/20/23 0600 08/20/23 0801  BP: 132/75  (!) 140/87 133/63  Pulse: 82  88 66  Resp: 16  16 16   Temp: 97.9 F (36.6 C)  (!) 97.5 F (36.4 C) (!) 97.5 F (36.4 C)  TempSrc: Oral  Oral Oral  SpO2: 100%  95% 92%  Weight:  108.9 kg    Height:       No intake or output data in the 24 hours ending 08/20/23 0955     08/20/2023    5:00 AM 08/19/2023    4:31 AM 08/17/2023    9:25 AM  Last 3 Weights  Weight (lbs) 240 lb 240 lb 8.4 oz 250 lb  Weight (kg) 108.863 kg 109.1 kg 113.399 kg      Telemetry    Normal sinus rhythm- Personally Reviewed  ECG    No new ECG - Personally Reviewed  Physical Exam   GEN: No acute distress.   Neck: No JVD Cardiac: RRR, no murmurs, rubs, or gallops.  Respiratory: Clear to auscultation bilaterally. GI: Soft, nontender, non-distended  MS: 1+ edema Neuro:  Nonfocal  Psych: Normal affect   Labs    High Sensitivity Troponin:   Recent Labs  Lab 08/17/23 1012 08/17/23 1241  TROPONINIHS 4 5     Chemistry Recent Labs  Lab 08/18/23 0439 08/19/23 0257  08/20/23 0240  NA 139 139 138  K 3.3* 3.5 3.5  CL 107 104 103  CO2 20* 26 25  GLUCOSE 119* 113* 111*  BUN 12 16 19   CREATININE 0.88 0.93 0.95  CALCIUM 8.5* 9.5 8.9  MG  --  2.3 2.3  PROT 6.0*  --   --   ALBUMIN 3.1*  --   --   AST 20  --   --   ALT 31  --   --   ALKPHOS 61  --   --   BILITOT 1.4*  --   --   GFRNONAA >60 >60 >60  ANIONGAP 12 9 10     Lipids No results for input(s): "CHOL", "TRIG", "HDL", "LABVLDL", "LDLCALC", "CHOLHDL" in the last 168 hours.  Hematology Recent Labs  Lab 08/17/23 1012 08/17/23 1019 08/18/23 0439 08/19/23 0257  WBC 10.5  --  9.4 9.0  RBC 5.09  --  4.90 5.14  HGB 13.7 14.3 13.6 14.0  HCT 44.4 42.0 42.4 42.5  MCV 87.2  --  86.5 82.7  MCH 26.9  --  27.8 27.2  MCHC 30.9  --  32.1  32.9  RDW 14.9  --  15.0 14.8  PLT 362  --  368 381   Thyroid No results for input(s): "TSH", "FREET4" in the last 168 hours.  BNP Recent Labs  Lab 08/17/23 1012  BNP 14.6    DDimer  Recent Labs  Lab 08/18/23 1423  DDIMER 0.53*     Radiology    ECHOCARDIOGRAM LIMITED  Result Date: 08/18/2023    ECHOCARDIOGRAM LIMITED REPORT   Patient Name:   Larry Elliott Date of Exam: 08/18/2023 Medical Rec #:  [id number]        Height:       70.0 in Accession #:    [id number]       Weight:       250.0 lb Date of Birth:  Dec 17, 1945        BSA:          2.295 m Patient Age:    77 years         BP:           144/81 mmHg Patient Gender: M                HR:           81 bpm. Exam Location:  Inpatient Procedure: Color Doppler, Cardiac Doppler and Limited Echo Indications:    dyspnea  History:        Patient has prior history of Echocardiogram examinations, most                 recent 03/10/2023. CAD, Signs/Symptoms:Edema; Risk                 Factors:Hypertension and Dyslipidemia.  Sonographer:    Delcie Roch RDCS Referring Phys: [id number] CALLIE E GOODRICH IMPRESSIONS  1. LIMITED ECHO.  2. Left ventricular ejection fraction, by estimation, is 60 to 65%. The left ventricle  has normal function. The left ventricle has no regional wall motion abnormalities.  3. Right ventricular systolic function grossly normal. The right ventricular size is grossly normal.  4. The mitral valve is grossly normal. No evidence of mitral valve regurgitation. No evidence of mitral stenosis.  5. The aortic valve is grossly normal. Aortic valve regurgitation is not visualized. No aortic stenosis is present.  6. Aortic proximal ascending aorta normal.  7. The inferior vena cava is normal in size with greater than 50% respiratory variability, suggesting right atrial pressure of 3 mmHg. FINDINGS  Left Ventricle: Left ventricular ejection fraction, by estimation, is 60 to 65%. The left ventricle has normal function. The left ventricle has no regional wall motion abnormalities. Right Ventricle: The right ventricular size is grossly normal. Right ventricular systolic function grossly normal. Pericardium: There is no evidence of pericardial effusion. Mitral Valve: The mitral valve is grossly normal. No evidence of mitral valve stenosis. Tricuspid Valve: The tricuspid valve is grossly normal. Tricuspid valve regurgitation is not demonstrated. No evidence of tricuspid stenosis. Aortic Valve: The aortic valve is grossly normal. Aortic valve regurgitation is not visualized. No aortic stenosis is present. Pulmonic Valve: The pulmonic valve was not well visualized. Pulmonic valve regurgitation is not visualized. No evidence of pulmonic stenosis. Aorta: Proximal ascending aorta normal and the aortic root was not well visualized. Venous: The inferior vena cava is normal in size with greater than 50% respiratory variability, suggesting right atrial pressure of 3 mmHg. Additional Comments: Spectral Doppler performed. Color Doppler performed.   Diastology LV e' medial:    7.72 cm/s LV E/e' medial:  7.5 LV e' lateral:   7.94 cm/s LV E/e' lateral: 7.3  AORTIC VALVE LVOT Vmax:   111.00 cm/s LVOT Vmean:  72.600 cm/s LVOT VTI:     0.208 m  AORTA Ao Asc diam: 3.50 cm MITRAL VALVE MV Area (PHT): 3.31 cm    SHUNTS MV Decel Time: 229 msec    Systemic VTI: 0.21 m MV E velocity: 58.00 cm/s MV A velocity: 91.00 cm/s MV E/A ratio:  0.64 Sunit Tolia Electronically signed by Tessa Lerner Signature Date/Time: 08/18/2023/7:09:20 PM    Final     Cardiac Studies     Patient Profile     77 y.o. male with a history of minimal non-obstructive CAD on cardiac catheterization in 06/2010, ascending thoracic aortic aneurysm, hypertension, hyperlipidemia, chronic lower extremity edema, prostate cancer, and melanoma who is being seen 08/18/2023 for concerns of CHF   Assessment & Plan    Acute Respiratory Distress Patient presented with acute onset of respiratory distress in the setting of hypertensive emergency. Initially required BiPAP. Chest x-ray shows cardiomegaly and pulmonary vascular congestion but BNP only 14. Echo in 02/2023 showed 60-65% with normal wall motion and diastolic parameters and no significant valvular disease.  Age-adjusted D-dimer negative.  Patient was started on IV Lasix.  - Echo shows EF 60 to 65%, normal RV function, no significant valvular disease, RAP 3 - Suspect presentation more of a flash pulmonary edema in setting of hypertensive emergency rather than true diastolic CHF.  - Started Spironolactone 25mg  daily for hypertension. - Appears euvolemic except for chronic LE edema, converted to p.o. Lasix 40 mg daily   Minimal Non-Obstructive CAD Noted on cardiac catheterization in 2011. High-sensitivity troponin negative x2.  - No chest pain. - Continue statin.    Hypertensive Emergency Systolic BP of 200 in the field. However, BP much improved with diuresis and is now well controlled. He does not have any antihypertensives listed on his PTA medication list. -Started spironolactone 25mg  daily.    Hyperlipidemia Continue home Lipitor 40mg  daily and Fenofibrate 160mg  daily.   Hypokalemia - repleted by primary  team.   Otherwise, per primary team: - Hyperglycemia - Mild cognitive deficits: on Aricept - Anxiety/ depression - Obesity   [facility] will sign off.   Medication Recommendations: Jardiance 10 mg daily, Lasix 40 mg daily, spironolactone 25 mg daily Other recommendations (labs, testing, etc): BMET in 1 week Follow up as an outpatient: We will schedule   For questions or updates, please contact  [facility] Please consult [email or web address] for contact info under       Signed, Little Ishikawa, MD  08/20/2023, 9:55 AM

## 2023-08-20 NOTE — Plan of Care (Signed)
  Problem: Activity: Goal: Capacity to carry out activities will improve Outcome: Progressing   Problem: Cardiac: Goal: Ability to achieve and maintain adequate cardiopulmonary perfusion will improve Outcome: Progressing   Problem: Health Behavior/Discharge Planning: Goal: Ability to manage health-related needs will improve Outcome: Progressing   Problem: Clinical Measurements: Goal: Ability to maintain clinical measurements within normal limits will improve Outcome: Progressing Goal: Will remain free from infection Outcome: Progressing Goal: Diagnostic test results will improve Outcome: Progressing Goal: Respiratory complications will improve Outcome: Progressing Goal: Cardiovascular complication will be avoided Outcome: Progressing   Problem: Activity: Goal: Risk for activity intolerance will decrease Outcome: Progressing   Problem: Nutrition: Goal: Adequate nutrition will be maintained Outcome: Progressing   Problem: Coping: Goal: Level of anxiety will decrease Outcome: Progressing   Problem: Elimination: Goal: Will not experience complications related to bowel motility Outcome: Progressing Goal: Will not experience complications related to urinary retention Outcome: Progressing   Problem: Pain Management: Goal: General experience of comfort will improve Outcome: Progressing   Problem: Safety: Goal: Ability to remain free from injury will improve Outcome: Progressing   Problem: Skin Integrity: Goal: Risk for impaired skin integrity will decrease Outcome: Progressing   Problem: Education: Goal: Ability to demonstrate management of disease process will improve Outcome: Not Progressing Goal: Ability to verbalize understanding of medication therapies will improve Outcome: Not Progressing   Problem: Education: Goal: Knowledge of General Education information will improve Description: Including pain rating scale, medication(s)/side effects and  non-pharmacologic comfort measures Outcome: Not Progressing

## 2023-08-20 NOTE — Discharge Summary (Signed)
Physician Discharge Summary  Larry Elliott:[id number] DOB: Jan 04, 1946 DOA: 08/17/2023  PCP: Larry Plump, MD  Admit date: 08/17/2023 Discharge date: 08/20/2023  Time spent: 35 minutes  Recommendations for Outpatient Follow-up:  Cardiology Dr. Bjorn Pippin in 2 weeks, please check BMP at follow-up Home health RN   Discharge Diagnoses:  Principal Problem:   Acute on chronic diastolic (congestive) heart failure (HCC) Active Problems:   Hyperlipidemia   Essential hypertension   Anxiety and depression   MCI (mild cognitive impairment)   Obesity (BMI 30-39.9)   Chronic edema   Acute respiratory failure with hypoxia [facility])   Hypertensive emergency   Discharge Condition: Improved  Diet recommendation: Low sodium, heart healthy  Filed Weights   08/17/23 0925 08/19/23 0431 08/20/23 0500  Weight: 113.4 kg 109.1 kg 108.9 kg    History of present illness:  77/M with history of CAD, chronic lower extremity edema, dyslipidemia, obesity, remote history of prostate cancer presented to the ED with acute onset respiratory distress 10/24. -Poor historian, reports chronic lower extremity edema for which she is on Lasix, last week he noticed some shortness of breath with activity, acutely worse with distress the morning of admission.  Poor historian -In the ED tachypneic in respiratory distress placed on BiPAP.  Labs noted sodium 138, creatinine 0.8, troponin 4, BNP 14, hemoglobin 13.7, chest x-ray with cardiomegaly and pulmonary vascular congestion  Hospital Course:   Acute diastolic CHF 2D echo 5/24 with preserved EF, normal RV -Repeat echo with EF 60-65% normal RV and no significant valvular disease -At baseline has chronic lower extremity edema, now presenting with respiratory distress, volume overload, hypertensive urgency -Eats ultra processed premade frozen meals every day, counseled regarding this -Required BiPAP on admission, now off -Diuresed with IV Lasix, started on Jardiance  and Aldactone  -Volume status has improved, discharged home on oral Lasix -Has follow-up with cardiology next week, please check BMP  Anxiety, depression -Continue Zoloft   Mild cognitive deficits, -Continue Aricept   Hyperglycemia Now started on Jardiance, A1c pending at discharge   Obesity -BMI is 35.8,    Dyslipidemia -Continue statin and fenofibrate  Discharge Exam: Vitals:   08/20/23 0600 08/20/23 0801  BP: (!) 140/87 133/63  Pulse: 88 66  Resp: 16 16  Temp: (!) 97.5 F (36.4 C) (!) 97.5 F (36.4 C)  SpO2: 95% 92%     Discharge Instructions   Discharge Instructions     Diet - low sodium heart healthy   Complete by: As directed    Increase activity slowly   Complete by: As directed       Allergies as of 08/20/2023       Reactions   Ambien [zolpidem] Other (See Comments)   Encephalopathy, admitted 04-2022, symptoms suspected to be from [place]   Aspirin Swelling   swelling in face   Hydrocodone Other (See Comments)   Insomnia, mild SOB (w/o cough-wheezing)   Penicillins Swelling   swelling in face        Medication List     STOP taking these medications    docusate sodium 100 MG capsule Commonly known as: COLACE   doxycycline 100 MG tablet Commonly known as: VIBRA-TABS       TAKE these medications    atorvastatin 40 MG tablet Commonly known as: LIPITOR Take 1 tablet (40 mg total) by mouth at bedtime. What changed: when to take this   donepezil 5 MG tablet Commonly known as: ARICEPT Take 1 tablet (5 mg total) by  mouth at bedtime.   empagliflozin 10 MG Tabs tablet Commonly known as: JARDIANCE Take 1 tablet (10 mg total) by mouth daily. Start taking on: August 21, 2023   fenofibrate micronized 134 MG capsule Commonly known as: LOFIBRA Take 1 capsule (134 mg total) by mouth daily before breakfast.   furosemide 40 MG tablet Commonly known as: LASIX Take 1 tablet (40 mg total) by mouth daily. What changed:  medication  strength how much to take when to take this   gabapentin 100 MG capsule Commonly known as: NEURONTIN Take 1 capsule (100 mg total) by mouth 3 (three) times daily.   melatonin 5 MG Tabs Take 1 tablet (5 mg total) by mouth at bedtime.   ondansetron 4 MG tablet Commonly known as: ZOFRAN TAKE 1 TABLET BY MOUTH EVERY 8 HOURS AS NEEDED FOR NAUSEA OR VOMITING   potassium chloride SA 20 MEQ tablet Commonly known as: KLOR-CON M Take 1 tablet (20 mEq total) by mouth daily. What changed: when to take this   sertraline 50 MG tablet Commonly known as: ZOLOFT Take 1 tablet (50 mg total) by mouth daily.   spironolactone 25 MG tablet Commonly known as: ALDACTONE Take 1 tablet (25 mg total) by mouth daily. Start taking on: August 21, 2023       Allergies  Allergen Reactions   Ambien [Zolpidem] Other (See Comments)    Encephalopathy, admitted 04-2022, symptoms suspected to be from [place]   Aspirin Swelling    swelling in face   Hydrocodone Other (See Comments)    Insomnia, mild SOB (w/o cough-wheezing)   Penicillins Swelling    swelling in face      The results of significant diagnostics from this hospitalization (including imaging, microbiology, ancillary and laboratory) are listed below for reference.    Significant Diagnostic Studies: ECHOCARDIOGRAM LIMITED  Result Date: 08/18/2023    ECHOCARDIOGRAM LIMITED REPORT   Patient Name:   Larry Elliott Date of Exam: 08/18/2023 Medical Rec #:  [id number]        Height:       70.0 in Accession #:    [id number]       Weight:       250.0 lb Date of Birth:  [birth year]        BSA:          2.295 m Patient Age:    77 years         BP:           144/81 mmHg Patient Gender: M                HR:           81 bpm. Exam Location:  Inpatient Procedure: Color Doppler, Cardiac Doppler and Limited Echo Indications:    dyspnea  History:        Patient has prior history of Echocardiogram examinations, most                 recent 03/10/2023. CAD,  Signs/Symptoms:Edema; Risk                 Factors:Hypertension and Dyslipidemia.  Sonographer:    Delcie Roch RDCS Referring Phys: [id number] Larry Elliott IMPRESSIONS  1. LIMITED ECHO.  2. Left ventricular ejection fraction, by estimation, is 60 to 65%. The left ventricle has normal function. The left ventricle has no regional wall motion abnormalities.  3. Right ventricular systolic function grossly normal. The right ventricular size is grossly normal.  4. The  mitral valve is grossly normal. No evidence of mitral valve regurgitation. No evidence of mitral stenosis.  5. The aortic valve is grossly normal. Aortic valve regurgitation is not visualized. No aortic stenosis is present.  6. Aortic proximal ascending aorta normal.  7. The inferior vena cava is normal in size with greater than 50% respiratory variability, suggesting right atrial pressure of 3 mmHg. FINDINGS  Left Ventricle: Left ventricular ejection fraction, by estimation, is 60 to 65%. The left ventricle has normal function. The left ventricle has no regional wall motion abnormalities. Right Ventricle: The right ventricular size is grossly normal. Right ventricular systolic function grossly normal. Pericardium: There is no evidence of pericardial effusion. Mitral Valve: The mitral valve is grossly normal. No evidence of mitral valve stenosis. Tricuspid Valve: The tricuspid valve is grossly normal. Tricuspid valve regurgitation is not demonstrated. No evidence of tricuspid stenosis. Aortic Valve: The aortic valve is grossly normal. Aortic valve regurgitation is not visualized. No aortic stenosis is present. Pulmonic Valve: The pulmonic valve was not well visualized. Pulmonic valve regurgitation is not visualized. No evidence of pulmonic stenosis. Aorta: Proximal ascending aorta normal and the aortic root was not well visualized. Venous: The inferior vena cava is normal in size with greater than 50% respiratory variability, suggesting right atrial  pressure of 3 mmHg. Additional Comments: Spectral Doppler performed. Color Doppler performed.   Diastology LV e' medial:    7.72 cm/s LV E/e' medial:  7.5 LV e' lateral:   7.94 cm/s LV E/e' lateral: 7.3  AORTIC VALVE LVOT Vmax:   111.00 cm/s LVOT Vmean:  72.600 cm/s LVOT VTI:    0.208 m  AORTA Ao Asc diam: 3.50 cm MITRAL VALVE MV Area (PHT): 3.31 cm    SHUNTS MV Decel Time: 229 msec    Systemic VTI: 0.21 m MV E velocity: 58.00 cm/s MV A velocity: 91.00 cm/s MV E/A ratio:  0.64 Sunit Tolia Electronically signed by Tessa Lerner Signature Date/Time: 08/18/2023/7:09:20 PM    Final    DG Chest Port 1 View  Result Date: 08/17/2023 CLINICAL DATA:  Shortness of breath. EXAM: PORTABLE CHEST 1 VIEW COMPARISON:  Chest radiograph dated 07/25/2023 FINDINGS: The heart size and mediastinal contours are unchanged. Bibasilar linear atelectasis or scarring. Bilateral interstitial prominence. No pneumothorax or sizable pleural effusion. The visualized skeletal structures are unchanged. IMPRESSION: Cardiomegaly with pulmonary vascular congestion. Electronically Signed   By: Hart Robinsons M.D.   On: 08/17/2023 10:45   DG Chest 2 View  Result Date: 08/13/2023 CLINICAL DATA:  pedal edema EXAM: CHEST - 2 VIEW COMPARISON:  12/28/2022. FINDINGS: Cardiac silhouette is prominent. There is pulmonary interstitial prominence with vascular congestion. Linear scarring or subsegmental atelectasis at the bases. Otherwise no focal consolidation. No pneumothorax or pleural effusion identified. IMPRESSION: Findings suggest CHF. Electronically Signed   By: Layla Maw M.D.   On: 08/13/2023 14:17    Microbiology: No results found for this or any previous visit (from the past 240 hour(s)).   Labs: Basic Metabolic Panel: Recent Labs  Lab 08/17/23 1012 08/17/23 1019 08/18/23 0439 08/19/23 0257 08/20/23 0240  NA 138 140 139 139 138  K 3.6 3.5 3.3* 3.5 3.5  CL 105 107 107 104 103  CO2 21*  --  20* 26 25  GLUCOSE 105* 104*  119* 113* 111*  BUN 7* 7* 12 16 19   CREATININE 0.80 0.70 0.88 0.93 0.95  CALCIUM 8.6*  --  8.5* 9.5 8.9  MG  --   --   --  2.3 2.3   Liver Function Tests: Recent Labs  Lab 08/18/23 0439  AST 20  ALT 31  ALKPHOS 61  BILITOT 1.4*  PROT 6.0*  ALBUMIN 3.1*   No results for input(s): "LIPASE", "AMYLASE" in the last 168 hours. No results for input(s): "AMMONIA" in the last 168 hours. CBC: Recent Labs  Lab 08/17/23 1012 08/17/23 1019 08/18/23 0439 08/19/23 0257  WBC 10.5  --  9.4 9.0  NEUTROABS 8.2*  --   --   --   HGB 13.7 14.3 13.6 14.0  HCT 44.4 42.0 42.4 42.5  MCV 87.2  --  86.5 82.7  PLT 362  --  368 381   Cardiac Enzymes: No results for input(s): "CKTOTAL", "CKMB", "CKMBINDEX", "TROPONINI" in the last 168 hours. BNP: BNP (last 3 results) Recent Labs    11/24/22 1050 08/17/23 1012  BNP 19.7 14.6    ProBNP (last 3 results) Recent Labs    01/09/23 1436 07/25/23 1337  PROBNP 14.0 12.0    CBG: No results for input(s): "GLUCAP" in the last 168 hours.     Signed:  Zannie Cove MD.  [facility] 08/20/2023, 10:15 AM

## 2023-08-21 ENCOUNTER — Telehealth: Payer: Self-pay

## 2023-08-21 NOTE — Telephone Encounter (Signed)
Called patient left message on personal voice mail Dr.Schumann advised to schedule appointment with him 11/7 at 4:20 pm.

## 2023-08-22 ENCOUNTER — Telehealth: Payer: Self-pay

## 2023-08-22 NOTE — Telephone Encounter (Signed)
Physician orders signed and faxed back to [facility] at [phone]. Form sent for scanning.

## 2023-08-23 ENCOUNTER — Telehealth: Payer: Self-pay | Admitting: Internal Medicine

## 2023-08-23 DIAGNOSIS — G4700 Insomnia, unspecified: Secondary | ICD-10-CM | POA: Diagnosis not present

## 2023-08-23 DIAGNOSIS — I110 Hypertensive heart disease with heart failure: Secondary | ICD-10-CM | POA: Diagnosis not present

## 2023-08-23 DIAGNOSIS — M1990 Unspecified osteoarthritis, unspecified site: Secondary | ICD-10-CM | POA: Diagnosis not present

## 2023-08-23 DIAGNOSIS — F419 Anxiety disorder, unspecified: Secondary | ICD-10-CM | POA: Diagnosis not present

## 2023-08-23 DIAGNOSIS — I509 Heart failure, unspecified: Secondary | ICD-10-CM | POA: Diagnosis not present

## 2023-08-23 DIAGNOSIS — F32A Depression, unspecified: Secondary | ICD-10-CM | POA: Diagnosis not present

## 2023-08-23 NOTE — Telephone Encounter (Signed)
Spoke w/ Lillia Abed- verbal orders given for PT, OT, ST.

## 2023-08-23 NOTE — Telephone Encounter (Signed)
Caller/Agency: Lillia Abed [facility]) Callback Number: [phone] Requesting OT/PT/Skilled Nursing/Social Work/Speech Therapy: Skilled Nursing Frequency: 2 w 3, 1 w 5  She also requested orders for PT, OT, ST  She also stated that pt is not taking trazodone and melatonin and pt was started on two other medications from hospital stay.

## 2023-08-28 ENCOUNTER — Encounter: Payer: Self-pay | Admitting: Internal Medicine

## 2023-08-28 DIAGNOSIS — G4700 Insomnia, unspecified: Secondary | ICD-10-CM | POA: Diagnosis not present

## 2023-08-28 DIAGNOSIS — M1990 Unspecified osteoarthritis, unspecified site: Secondary | ICD-10-CM | POA: Diagnosis not present

## 2023-08-28 DIAGNOSIS — I509 Heart failure, unspecified: Secondary | ICD-10-CM | POA: Diagnosis not present

## 2023-08-28 DIAGNOSIS — F32A Depression, unspecified: Secondary | ICD-10-CM | POA: Diagnosis not present

## 2023-08-28 DIAGNOSIS — I110 Hypertensive heart disease with heart failure: Secondary | ICD-10-CM | POA: Diagnosis not present

## 2023-08-28 DIAGNOSIS — F419 Anxiety disorder, unspecified: Secondary | ICD-10-CM | POA: Diagnosis not present

## 2023-08-29 DIAGNOSIS — F32A Depression, unspecified: Secondary | ICD-10-CM | POA: Diagnosis not present

## 2023-08-29 DIAGNOSIS — G4700 Insomnia, unspecified: Secondary | ICD-10-CM | POA: Diagnosis not present

## 2023-08-29 DIAGNOSIS — F419 Anxiety disorder, unspecified: Secondary | ICD-10-CM | POA: Diagnosis not present

## 2023-08-29 DIAGNOSIS — M1990 Unspecified osteoarthritis, unspecified site: Secondary | ICD-10-CM | POA: Diagnosis not present

## 2023-08-29 DIAGNOSIS — I509 Heart failure, unspecified: Secondary | ICD-10-CM | POA: Diagnosis not present

## 2023-08-29 DIAGNOSIS — I110 Hypertensive heart disease with heart failure: Secondary | ICD-10-CM | POA: Diagnosis not present

## 2023-08-31 ENCOUNTER — Ambulatory Visit: Payer: Medicare Other | Attending: Cardiology | Admitting: Cardiology

## 2023-08-31 ENCOUNTER — Encounter: Payer: Self-pay | Admitting: Cardiology

## 2023-08-31 VITALS — BP 141/81 | HR 73 | Ht 70.0 in | Wt 238.0 lb

## 2023-08-31 DIAGNOSIS — I5032 Chronic diastolic (congestive) heart failure: Secondary | ICD-10-CM

## 2023-08-31 DIAGNOSIS — F419 Anxiety disorder, unspecified: Secondary | ICD-10-CM | POA: Diagnosis not present

## 2023-08-31 DIAGNOSIS — I509 Heart failure, unspecified: Secondary | ICD-10-CM | POA: Diagnosis not present

## 2023-08-31 DIAGNOSIS — G4700 Insomnia, unspecified: Secondary | ICD-10-CM | POA: Diagnosis not present

## 2023-08-31 DIAGNOSIS — I77819 Aortic ectasia, unspecified site: Secondary | ICD-10-CM | POA: Diagnosis not present

## 2023-08-31 DIAGNOSIS — I10 Essential (primary) hypertension: Secondary | ICD-10-CM

## 2023-08-31 DIAGNOSIS — E785 Hyperlipidemia, unspecified: Secondary | ICD-10-CM

## 2023-08-31 DIAGNOSIS — M1990 Unspecified osteoarthritis, unspecified site: Secondary | ICD-10-CM | POA: Diagnosis not present

## 2023-08-31 DIAGNOSIS — I110 Hypertensive heart disease with heart failure: Secondary | ICD-10-CM | POA: Diagnosis not present

## 2023-08-31 DIAGNOSIS — F32A Depression, unspecified: Secondary | ICD-10-CM | POA: Diagnosis not present

## 2023-08-31 DIAGNOSIS — I2510 Atherosclerotic heart disease of native coronary artery without angina pectoris: Secondary | ICD-10-CM

## 2023-08-31 NOTE — Progress Notes (Signed)
Cardiology Office Note:    Date:  08/31/2023   ID:  Larry Elliott, DOB 01-Oct-1946, MRN [id number]  PCP:  Wanda Plump, MD  Cardiologist:  Little Ishikawa, MD  Electrophysiologist:  None   Referring MD: Wanda Plump, MD   Chief Complaint  Patient presents with   Congestive Heart Failure    History of Present Illness:    Larry Elliott is a 77 y.o. male with a hx of nonobstructive CAD, hypertension, hyperlipidemia, melanoma, prostate cancer who presents for follow-up.  He was referred by Dr. Drue Novel for preop evaluation prior to hip surgery, initially seen 12/19/2022.    Echocardiogram 04/2022 showed EF 50 to 55%, inferior basal hypokinesis, mild RV dysfunction, ascending aorta measuring 44 mm.  Cath in 2011 showed minimal CAD.  Echocardiogram 03/10/2023 showed EF 60 to 65%, normal diastolic function, normal RV function, no significant valvular disease, RAP 3 mmHg.  He was admitted with acute respiratory distress 07/2023.  Suspect flash pulmonary edema in setting of hypertensive emergency.  Initially required Bipap.  Echo 08/18/23 showed EF 60-65%, normal RV function, no significant valvular disease. Diuresed with IV lasix, was discharged on PO lasix 40 mg daily and spironolactone 25 mg daily.   Since discharge from the, he reports he is doing well.  States that his lower extremity but has improved.  Denies any chest pain, dyspnea, lightheadedness, syncope, or palpitations.   Wt Readings from Last 3 Encounters:  08/31/23 238 lb (108 kg)  08/20/23 240 lb (108.9 kg)  08/08/23 250 lb 6 oz (113.6 kg)   BP Readings from Last 3 Encounters:  08/31/23 (!) 141/81  08/20/23 133/63  08/08/23 136/68       Past Medical History:  Diagnosis Date   Allergy    penicillin & Aspirin   Anxiety 11/04/2013   Arthritis    BCC (basal cell carcinoma), leg    Right calf, L nape of neck at hairline   CAD (coronary artery disease)    CP, cath, non-obstructive   Cataract    been removed    Depression 11/04/2013   Diverticulosis    colon   Erectile dysfunction following radical prostatectomy    Hyperlipidemia    Hypertension    Insomnia    Melanoma (HCC) 2010   face, sees derm    Prostate CA (HCC) 07/2007   s/p robotic surgery   Spermatocele    Left    Past Surgical History:  Procedure Laterality Date   APPENDECTOMY     age 6   EYE SURGERY     cataracts removed   HERNIA REPAIR  2010   JOINT REPLACEMENT  hip 2024   KNEE ARTHROSCOPY Left 1965   meniscal tear   PROSTATECTOMY  07/25/2007    robotic,  for prostate cancer     SHOULDER ARTHROSCOPY Left 07/09/2013   Dr Ave Filter    SPINE SURGERY     TOTAL HIP ARTHROPLASTY Left 12/27/2022   Procedure: LEFT TOTAL HIP ARTHROPLASTY ANTERIOR APPROACH;  Surgeon: Marcene Corning, MD;  Location: [facility];  Service: Orthopedics;  Laterality: Left;   TRANSFORAMINAL LUMBAR INTERBODY FUSION (TLIF) WITH PEDICLE SCREW FIXATION 1 LEVEL Right 12/04/2019   Procedure: RIGHT-SIDED TRANSFORAMINAL LUMBAR INTERBODY FUSION LUMBAR FOUR THROUGH FIVE WITH INSTRUMENTATION AND ALLOGRAFT;  Surgeon: Estill Bamberg, MD;  Location: [facility];  Service: Orthopedics;  Laterality: Right;    Current Medications: Current Meds  Medication Sig   atorvastatin (LIPITOR) 40 MG tablet Take 1 tablet (40 mg total)  by mouth at bedtime. (Patient taking differently: Take 40 mg by mouth every evening.)   donepezil (ARICEPT) 5 MG tablet Take 1 tablet (5 mg total) by mouth at bedtime.   empagliflozin (JARDIANCE) 10 MG TABS tablet Take 1 tablet (10 mg total) by mouth daily.   fenofibrate micronized (LOFIBRA) 134 MG capsule Take 1 capsule (134 mg total) by mouth daily before breakfast.   furosemide (LASIX) 40 MG tablet Take 1 tablet (40 mg total) by mouth daily.   gabapentin (NEURONTIN) 100 MG capsule Take 1 capsule (100 mg total) by mouth 3 (three) times daily.   melatonin 5 MG TABS Take 1 tablet (5 mg total) by mouth at bedtime.   ondansetron (ZOFRAN) 4 MG tablet TAKE 1  TABLET BY MOUTH EVERY 8 HOURS AS NEEDED FOR NAUSEA OR VOMITING   potassium chloride SA (KLOR-CON M) 20 MEQ tablet Take 1 tablet (20 mEq total) by mouth daily.   sertraline (ZOLOFT) 50 MG tablet Take 1 tablet (50 mg total) by mouth daily.   spironolactone (ALDACTONE) 25 MG tablet Take 1 tablet (25 mg total) by mouth daily.     Allergies:   Ambien [zolpidem], Aspirin, Hydrocodone, and Penicillins   Social History   Socioeconomic History   Marital status: Married    Spouse name: Not on file   Number of children: 2   Years of education: Not on file   Highest education level: Some college, no degree  Occupational History   Occupation: retired Emergency planning/management officer   Tobacco Use   Smoking status: Never   Smokeless tobacco: Never  Vaping Use   Vaping status: Never Used  Substance and Sexual Activity   Alcohol use: Not Currently    Alcohol/week: 1.0 standard drink of alcohol    Types: 1 Glasses of wine per week    Comment: fruit glass size   Drug use: Never   Sexual activity: Not on file  Other Topics Concern   Not on file  Social History Narrative   Lives w/ wife    Original from [place]   Social Determinants of Health   Financial Resource Strain: Low Risk  (01/16/2023)   Overall Financial Resource Strain (CARDIA)    Difficulty of Paying Living Expenses: Not hard at all  Food Insecurity: No Food Insecurity (08/17/2023)   Hunger Vital Sign    Worried About Running Out of Food in the Last Year: Never true    Ran Out of Food in the Last Year: Never true  Transportation Needs: No Transportation Needs (08/17/2023)   PRAPARE - Administrator, Civil Service (Medical): No    Lack of Transportation (Non-Medical): No  Physical Activity: Unknown (01/16/2023)   Exercise Vital Sign    Days of Exercise per Week: 0 days    Minutes of Exercise per Session: Not on file  Stress: Stress Concern Present (01/16/2023)   [employer] of Occupational Health - Occupational Stress  Questionnaire    Feeling of Stress : Rather much  Social Connections: Moderately Isolated (01/16/2023)   Social Connection and Isolation Panel [NHANES]    Frequency of Communication with Friends and Family: Once a week    Frequency of Social Gatherings with Friends and Family: Once a week    Attends Religious Services: 1 to 4 times per year    Active Member of [employer] or Organizations: No    Attends Engineer, structural: Not on file    Marital Status: Married     Family History: The  patient's family history includes Coronary artery disease (age of onset: 51) in his father; Stroke in his mother. There is no history of Hypertension, Diabetes, Colon cancer, or Prostate cancer.  ROS:   Please see the history of present illness.     All other systems reviewed and are negative.  EKGs/Labs/Other Studies Reviewed:    The following studies were reviewed today:   EKG:   12/19/2022: Normal sinus rhythm, rate 101, Q waves in leads III, aVF  Recent Labs: 02/28/2023: TSH 1.20 07/25/2023: Pro B Natriuretic peptide (BNP) 12.0 08/17/2023: B Natriuretic Peptide 14.6 08/18/2023: ALT 31 08/19/2023: Hemoglobin 14.0; Platelets 381 08/20/2023: BUN 19; Creatinine, Ser 0.95; Magnesium 2.3; Potassium 3.5; Sodium 138  Recent Lipid Panel    Component Value Date/Time   CHOL 138 05/03/2023 1158   TRIG 315.0 (H) 05/03/2023 1158   TRIG 276 (HH) 09/05/2006 0835   HDL 40.50 05/03/2023 1158   CHOLHDL 3 05/03/2023 1158   VLDL 63.0 (H) 05/03/2023 1158   LDLCALC 56 02/19/2013 1624   LDLDIRECT 53.0 05/03/2023 1158    Physical Exam:    VS:  BP (!) 141/81 (BP Location: Left Arm, Patient Position: Sitting, Cuff Size: Normal)   Pulse 73   Ht 5\' 10"  (1.778 m)   Wt 238 lb (108 kg)   SpO2 94%   BMI 34.15 kg/m     Wt Readings from Last 3 Encounters:  08/31/23 238 lb (108 kg)  08/20/23 240 lb (108.9 kg)  08/08/23 250 lb 6 oz (113.6 kg)     GEN:  Well nourished, well developed in no acute  distress HEENT: Normal NECK: No JVD; No carotid bruits CARDIAC: RRR, no murmurs, rubs, gallops RESPIRATORY:  Clear to auscultation without rales, wheezing or rhonchi  ABDOMEN: Soft, non-tender, non-distended MUSCULOSKELETAL:  trace BLE edema; No deformity  SKIN: Warm and dry NEUROLOGIC:  Alert and oriented x 3 PSYCHIATRIC:  Normal affect   ASSESSMENT:    1. Chronic diastolic heart failure (HCC)   2. Essential hypertension   3. Hyperlipidemia, unspecified hyperlipidemia type   4. Aortic dilatation (HCC)   5. Coronary artery disease involving native coronary artery of native heart without angina pectoris       PLAN:    Chronic diastolic heart failure: He was admitted with acute respiratory distress 07/2023.  Suspect flash pulmonary edema in setting of hypertensive emergency.  Initially required Bipap.  Echo 08/18/23 showed EF 60-65%, normal RV function, no significant valvular disease. Diuresed with IV lasix, was discharged on PO lasix 40 mg daily and spironolactone 25 mg daily.  -Continue lasix 40 mg daily, spironolactone 25 mg daily, and jardiance 10 mg daily.  Check BMET, magnesium -Encouraged to use compression stockings to help with lower extremity edema  Aortic dilatation: Ascending aorta measured 44 mm on echocardiogram 04/2022.  Measured 43mm on echo 02/2023, will monitor  Nonobstructive CAD: Cath in 2011 showed minimal CAD.  Continue statin  Hyperlipidemia: On atorvastatin 40 mg daily.  LDL 53 on 05/03/2023  Hypertension: continue spironolactone 25 mg daily.  Check BMET   RTC in 3 months  Medication Adjustments/Labs and Tests Ordered: Current medicines are reviewed at length with the patient today.  Concerns regarding medicines are outlined above.  Orders Placed This Encounter  Procedures   Magnesium   Basic Metabolic Panel (BMET)   No orders of the defined types were placed in this encounter.   Patient Instructions  Medication Instructions:  Continue current  medications *If you need a refill on your  cardiac medications before your next appointment, please call your pharmacy*   Lab Work: BMET, [place] If you have labs (blood work) drawn today and your tests are completely normal, you will receive your results only by: MyChart Message (if you have MyChart) OR A paper copy in the mail If you have any lab test that is abnormal or we need to change your treatment, we will call you to review the results.   Testing/Procedures: NONE   Follow-Up: At [facility], you and your health needs are our priority.  As part of our continuing mission to provide you with exceptional heart care, we have created designated Provider Care Teams.  These Care Teams include your primary Cardiologist (physician) and Advanced Practice Providers (APPs -  Physician Assistants and Nurse Practitioners) who all work together to provide you with the care you need, when you need it.   Your next appointment:   3 month(s)  Provider:   Dr. Bjorn Pippin  Other Instructions Please wear compression stocking daily. Put on am and take off at bedtime    Signed, Little Ishikawa, MD  08/31/2023 4:57 PM    [facility]

## 2023-08-31 NOTE — Patient Instructions (Signed)
Medication Instructions:  Continue current medications *If you need a refill on your cardiac medications before your next appointment, please call your pharmacy*   Lab Work: BMET, [place] If you have labs (blood work) drawn today and your tests are completely normal, you will receive your results only by: MyChart Message (if you have MyChart) OR A paper copy in the mail If you have any lab test that is abnormal or we need to change your treatment, we will call you to review the results.   Testing/Procedures: NONE   Follow-Up: At [facility], you and your health needs are our priority.  As part of our continuing mission to provide you with exceptional heart care, we have created designated Provider Care Teams.  These Care Teams include your primary Cardiologist (physician) and Advanced Practice Providers (APPs -  Physician Assistants and Nurse Practitioners) who all work together to provide you with the care you need, when you need it.   Your next appointment:   3 month(s)  Provider:   Dr. Bjorn Pippin  Other Instructions Please wear compression stocking daily. Put on am and take off at bedtime

## 2023-09-01 ENCOUNTER — Other Ambulatory Visit: Payer: Self-pay

## 2023-09-01 ENCOUNTER — Other Ambulatory Visit: Payer: Self-pay | Admitting: Internal Medicine

## 2023-09-01 DIAGNOSIS — M1990 Unspecified osteoarthritis, unspecified site: Secondary | ICD-10-CM | POA: Diagnosis not present

## 2023-09-01 DIAGNOSIS — E870 Hyperosmolality and hypernatremia: Secondary | ICD-10-CM

## 2023-09-01 DIAGNOSIS — I10 Essential (primary) hypertension: Secondary | ICD-10-CM

## 2023-09-01 DIAGNOSIS — I110 Hypertensive heart disease with heart failure: Secondary | ICD-10-CM | POA: Diagnosis not present

## 2023-09-01 DIAGNOSIS — G4700 Insomnia, unspecified: Secondary | ICD-10-CM | POA: Diagnosis not present

## 2023-09-01 DIAGNOSIS — E785 Hyperlipidemia, unspecified: Secondary | ICD-10-CM | POA: Diagnosis not present

## 2023-09-01 DIAGNOSIS — F32A Depression, unspecified: Secondary | ICD-10-CM | POA: Diagnosis not present

## 2023-09-01 DIAGNOSIS — F419 Anxiety disorder, unspecified: Secondary | ICD-10-CM | POA: Diagnosis not present

## 2023-09-01 DIAGNOSIS — I509 Heart failure, unspecified: Secondary | ICD-10-CM | POA: Diagnosis not present

## 2023-09-02 LAB — BASIC METABOLIC PANEL
BUN/Creatinine Ratio: 13 (ref 10–24)
BUN: 12 mg/dL (ref 8–27)
CO2: 22 mmol/L (ref 20–29)
Calcium: 9.4 mg/dL (ref 8.6–10.2)
Chloride: 103 mmol/L (ref 96–106)
Creatinine, Ser: 0.9 mg/dL (ref 0.76–1.27)
Glucose: 82 mg/dL (ref 70–99)
Potassium: 4.7 mmol/L (ref 3.5–5.2)
Sodium: 142 mmol/L (ref 134–144)
eGFR: 87 mL/min/{1.73_m2} (ref >59)

## 2023-09-02 LAB — MAGNESIUM: Magnesium: 2.4 mg/dL — ABNORMAL HIGH (ref 1.6–2.3)

## 2023-09-04 ENCOUNTER — Telehealth: Payer: Self-pay | Admitting: Internal Medicine

## 2023-09-04 DIAGNOSIS — G4700 Insomnia, unspecified: Secondary | ICD-10-CM | POA: Diagnosis not present

## 2023-09-04 DIAGNOSIS — F32A Depression, unspecified: Secondary | ICD-10-CM | POA: Diagnosis not present

## 2023-09-04 DIAGNOSIS — I110 Hypertensive heart disease with heart failure: Secondary | ICD-10-CM | POA: Diagnosis not present

## 2023-09-04 DIAGNOSIS — I509 Heart failure, unspecified: Secondary | ICD-10-CM | POA: Diagnosis not present

## 2023-09-04 DIAGNOSIS — M1990 Unspecified osteoarthritis, unspecified site: Secondary | ICD-10-CM | POA: Diagnosis not present

## 2023-09-04 DIAGNOSIS — F419 Anxiety disorder, unspecified: Secondary | ICD-10-CM | POA: Diagnosis not present

## 2023-09-04 MED ORDER — DOXYCYCLINE HYCLATE 100 MG PO TABS: 100.0000 mg | ORAL_TABLET | Freq: Two times a day (BID) | ORAL | 0 refills | Status: DC

## 2023-09-04 NOTE — Telephone Encounter (Signed)
[facility]) called with the following info:  Pt has new open areas in lower extremities. Areas are red and to warm to touch. Nurse was concerned that pt should be seen in office to address.

## 2023-09-04 NOTE — Telephone Encounter (Signed)
Appt scheduled 09/06/23

## 2023-09-04 NOTE — Telephone Encounter (Signed)
LMOM for Radovan- w/ verbal orders

## 2023-09-04 NOTE — Addendum Note (Signed)
Addended byConrad Richboro D on: 09/04/2023 02:49 PM   Modules accepted: Orders

## 2023-09-04 NOTE — Telephone Encounter (Signed)
In addition to the redness in the legs, the wife sent me a message (on her chart) about Larry Elliott being short of breath. He was admitted to the hospital and discharge 08/20/2023 with acute on chronic CHF. Was seen as an outpatient by cardiology 08/31/2023. With the patient's wife: She agrees that the legs are getting red.  No openings. No fever or chills. He is SOB on and off, + DOE. BP today 120/88, O2 sat 96% heart rate 86. Weight yesterday 232 pounds, weight today 233 pounds. Plan: Send doxycycline 100 mg 1 p.o. twice daily #14 no refills. Arrange a office visit here.  Please call the wife Today or tomorrow take extra Lasix 40 mg in the afternoon. I will send a message to cardiology regards SOB and DOE

## 2023-09-04 NOTE — Telephone Encounter (Signed)
Caller/Agency: [facility]  Callback Number: [phone] (ok to lvm) Requesting OT/PT/Skilled Nursing/Social Work/Speech Therapy: continue OT  Frequency: 1x5

## 2023-09-04 NOTE — Telephone Encounter (Signed)
Doxycycline rx sent. LMOM asking for Pt's wife Diannia Ruder to call office to schedule visit this week.

## 2023-09-05 ENCOUNTER — Telehealth: Payer: Self-pay

## 2023-09-05 NOTE — Telephone Encounter (Signed)
Plan of care signed and faxed back to [facility] at [phone]. Form sent for scanning.

## 2023-09-06 ENCOUNTER — Telehealth: Payer: Self-pay

## 2023-09-06 ENCOUNTER — Encounter: Payer: Self-pay | Admitting: Internal Medicine

## 2023-09-06 ENCOUNTER — Ambulatory Visit: Payer: Medicare Other | Admitting: Internal Medicine

## 2023-09-06 VITALS — BP 126/78 | HR 74 | Temp 98.0°F | Resp 20 | Ht 70.0 in | Wt 233.4 lb

## 2023-09-06 DIAGNOSIS — K5730 Diverticulosis of large intestine without perforation or abscess without bleeding: Secondary | ICD-10-CM

## 2023-09-06 DIAGNOSIS — Z91199 Patient's noncompliance with other medical treatment and regimen due to unspecified reason: Secondary | ICD-10-CM

## 2023-09-06 DIAGNOSIS — I5032 Chronic diastolic (congestive) heart failure: Secondary | ICD-10-CM | POA: Diagnosis not present

## 2023-09-06 DIAGNOSIS — I509 Heart failure, unspecified: Secondary | ICD-10-CM

## 2023-09-06 DIAGNOSIS — N5231 Erectile dysfunction following radical prostatectomy: Secondary | ICD-10-CM

## 2023-09-06 DIAGNOSIS — I110 Hypertensive heart disease with heart failure: Secondary | ICD-10-CM

## 2023-09-06 DIAGNOSIS — R609 Edema, unspecified: Secondary | ICD-10-CM

## 2023-09-06 DIAGNOSIS — F419 Anxiety disorder, unspecified: Secondary | ICD-10-CM

## 2023-09-06 DIAGNOSIS — R600 Localized edema: Secondary | ICD-10-CM

## 2023-09-06 DIAGNOSIS — G4700 Insomnia, unspecified: Secondary | ICD-10-CM

## 2023-09-06 DIAGNOSIS — G3184 Mild cognitive impairment, so stated: Secondary | ICD-10-CM

## 2023-09-06 DIAGNOSIS — E7800 Pure hypercholesterolemia, unspecified: Secondary | ICD-10-CM

## 2023-09-06 DIAGNOSIS — F32A Depression, unspecified: Secondary | ICD-10-CM

## 2023-09-06 DIAGNOSIS — R739 Hyperglycemia, unspecified: Secondary | ICD-10-CM

## 2023-09-06 DIAGNOSIS — I2510 Atherosclerotic heart disease of native coronary artery without angina pectoris: Secondary | ICD-10-CM

## 2023-09-06 DIAGNOSIS — M1990 Unspecified osteoarthritis, unspecified site: Secondary | ICD-10-CM

## 2023-09-06 MED ORDER — FUROSEMIDE 40 MG PO TABS: ORAL_TABLET | ORAL | Status: DC

## 2023-09-06 NOTE — Progress Notes (Unsigned)
Subjective:    Patient ID: Larry Elliott, male    DOB: [birth year], 77 y.o.   MRN: [id number]  DOS:  09/06/2023 Type of visit - description: Acute, here with his wife  Last seen 08/08/2023. After that, he was admitted to the hospital, discharged 08/20/2023. Saw cardiology in follow-up 08/31/2023. They suspect admission was related to flash pulmonary edema in the setting of hypertensive emergency. Latest EF: 60 to 65%.   Seen here today after home health nurse reported LE redness and the wife reported shortness of breath. I recommended doxycycline (has not started) and extra Lasix. Overall swelling looks better today patient's wife. Shortness of breath this "about the same".  No fever or chills. No chest pain.  Review of Systems See above   Past Medical History:  Diagnosis Date   Allergy    penicillin & Aspirin   Anxiety 11/04/2013   Arthritis    BCC (basal cell carcinoma), leg    Right calf, L nape of neck at hairline   CAD (coronary artery disease)    CP, cath, non-obstructive   Cataract    been removed   Depression 11/04/2013   Diverticulosis    colon   Erectile dysfunction following radical prostatectomy    Hyperlipidemia    Hypertension    Insomnia    Melanoma (HCC) 2010   face, sees derm    Prostate CA (HCC) 07/2007   s/p robotic surgery   Spermatocele    Left    Past Surgical History:  Procedure Laterality Date   APPENDECTOMY     age 48   EYE SURGERY     cataracts removed   HERNIA REPAIR  2010   JOINT REPLACEMENT  hip 2024   KNEE ARTHROSCOPY Left 1965   meniscal tear   PROSTATECTOMY  07/25/2007    robotic,  for prostate cancer     SHOULDER ARTHROSCOPY Left 07/09/2013   Dr Ave Filter    SPINE SURGERY     TOTAL HIP ARTHROPLASTY Left 12/27/2022   Procedure: LEFT TOTAL HIP ARTHROPLASTY ANTERIOR APPROACH;  Surgeon: Marcene Corning, MD;  Location: [facility];  Service: Orthopedics;  Laterality: Left;   TRANSFORAMINAL LUMBAR INTERBODY FUSION (TLIF) WITH  PEDICLE SCREW FIXATION 1 LEVEL Right 12/04/2019   Procedure: RIGHT-SIDED TRANSFORAMINAL LUMBAR INTERBODY FUSION LUMBAR FOUR THROUGH FIVE WITH INSTRUMENTATION AND ALLOGRAFT;  Surgeon: Estill Bamberg, MD;  Location: [facility];  Service: Orthopedics;  Laterality: Right;    Current Outpatient Medications  Medication Instructions   atorvastatin (LIPITOR) 40 mg, Oral, Daily at bedtime   donepezil (ARICEPT) 5 mg, Oral, Daily at bedtime   doxycycline (VIBRA-TABS) 100 mg, Oral, 2 times daily   empagliflozin (JARDIANCE) 10 mg, Oral, Daily   fenofibrate micronized (LOFIBRA) 134 mg, Oral, Daily before breakfast   furosemide (LASIX) 40 MG tablet 1 tablet every morning.<BR>Take an additional tablet at 1 PM on Monday Wednesday and Fridays   gabapentin (NEURONTIN) 100 mg, Oral, 3 times daily   melatonin 5 mg, Oral, Daily at bedtime   ondansetron (ZOFRAN) 4 mg, Oral, Every 8 hours PRN   potassium chloride SA (KLOR-CON M) 20 MEQ tablet 20 mEq, Oral, Daily   sertraline (ZOLOFT) 50 mg, Oral, Daily   spironolactone (ALDACTONE) 25 mg, Oral, Daily       Objective:   Physical Exam BP 126/78   Pulse 74   Temp 98 F (36.7 C) (Oral)   Resp 20   Ht 5\' 10"  (1.778 m)   Wt 233 lb 6 oz (105.9  kg)   SpO2 95%   BMI 33.49 kg/m  General:   Well developed, NAD, BMI noted. HEENT:  Normocephalic . Face symmetric, atraumatic Lungs:  CTA B Normal respiratory effort, no intercostal retractions, no accessory muscle use. Heart: RRR,  no murmur.  Lower extremities: Few pretibial excoriations, swelling compared to previous visit few weeks ago seem improved.  See picture.   Has  chronic skin changes: redness-induration Neurologic:  alert & oriented X3.  Speech normal, gait appropriate for age and unassisted Psych--  Behavior appropriate. No anxious or depressed appearing.      Assessment     Assessment  Hyperglycemia   HTN Hyperlipidemia Anxiety depression insomnia   dc clonazepam - not effective,  8-16, rx  trazodone. Metabolic encephalopathy admitted July 2023: No more Ambien  CV: --CAD, non-obstructive ---LE edema R>L (-) US DVT 2020 ---ABI 09/2019 WNL -- Diastolic CHF suspected, echo 02/2023 normal Prostate cancer-- 2008, robotic surgery, no incontinence, Dr Laverle Patter: now f/u per PCP as of 05-2018 [facility] ED past surgery  spermatocele, left Skin --Melanoma 2010, BCCs  does not see derm regularly as off 01-2022   PLAN Acute visit, here with his wife. Lower extremity edema, CHF: Recently admitted to hospital after a hypertensive crisis and flash pulmonary edema. We got a phone call a couple of days ago regards his legs getting red and he continue to be short of breath. I recommended an extra dose of Lasix. Here for f/u. Wife reports that he redness is about the same, lower extremity edema improved.  Shortness of breath seems at baseline.  The patient looks in no distress today.  O2 sat 95%. Plan: Increase from Lasix  40 mg qd  to 40 mg every day plus  40 mg at 1 pm  M-W-F Early cellulitis: Question of early cellulitis in the context of what seems to be chronic dermatitis.  Control of edema is paramount. Recommend to proceed with doxycycline, leg elevation, OTC hydrocortisone 1%. MCI: 05/03/2023, MMSE was 26.  Despite the high MMSE, he is not very functional, unable to take his own medications.  Started Aricept shortly after that visit. Med compliance: Still an issue, see previous entries.  Wife tells me today she gives Jonny Ruiz "all medications available at home";  recommend to follow the medication list provided after every visit or hospital admission, dispose of meds he does not need and call for RFs when appropiate. Referral to our clinical pharmacist failure 04-2023. Extensive coaching provided today. Home health: just gave  orders for OT, PT, skilled nursing, social work, speech therapy RTC 1 month

## 2023-09-06 NOTE — Telephone Encounter (Signed)
Plan of care and ST order received from [facility]- form signed and faxed back to [phone]. Forms sent for scanning.

## 2023-09-06 NOTE — Patient Instructions (Addendum)
Start the antibiotic I prescribed a few days ago.  Lasix 40 mg: Take 1 tablet every morning. On Mondays Wednesdays and Fridays: Take a extra tablet of Lasix at   1 PM.  Leg elevation at least an hour every morning and every afternoon  Apply hydrocortisone cream over-the-counter 1% to the legs twice daily  Keep checking your weight every day.  Notify cardiology or me if you increase more than 3 pounds.  Call if fever, chills, the legs get more red.  See the medication list and attached  Next visit 1 month.

## 2023-09-07 DIAGNOSIS — F419 Anxiety disorder, unspecified: Secondary | ICD-10-CM | POA: Diagnosis not present

## 2023-09-07 DIAGNOSIS — F32A Depression, unspecified: Secondary | ICD-10-CM | POA: Diagnosis not present

## 2023-09-07 DIAGNOSIS — G4700 Insomnia, unspecified: Secondary | ICD-10-CM | POA: Diagnosis not present

## 2023-09-07 DIAGNOSIS — I110 Hypertensive heart disease with heart failure: Secondary | ICD-10-CM | POA: Diagnosis not present

## 2023-09-07 DIAGNOSIS — I509 Heart failure, unspecified: Secondary | ICD-10-CM | POA: Diagnosis not present

## 2023-09-07 DIAGNOSIS — M1990 Unspecified osteoarthritis, unspecified site: Secondary | ICD-10-CM | POA: Diagnosis not present

## 2023-09-07 NOTE — Assessment & Plan Note (Signed)
Acute visit, here with his wife. Lower extremity edema, CHF: Recently admitted to hospital after a hypertensive crisis and flash pulmonary edema. We got a phone call a couple of days ago regards his legs getting red and he continue to be short of breath. I recommended an extra dose of Lasix. Here for f/u. Wife reports that he redness is about the same, lower extremity edema improved.  Shortness of breath seems at baseline.  The patient looks in no distress today.  O2 sat 95%. Plan: Increase from Lasix  40 mg qd  to 40 mg every day plus  40 mg at 1 pm  M-W-F Early cellulitis: Question of early cellulitis in the context of what seems to be chronic dermatitis.  Control of edema is paramount. Recommend to proceed with doxycycline, leg elevation, OTC hydrocortisone 1%. MCI: 05/03/2023, MMSE was 26.  Despite the high MMSE, he is not very functional, unable to take his own medications.  Started Aricept shortly after that visit. Med compliance: Still an issue, see previous entries.  Wife tells me today she gives Jonny Ruiz "all medications available at home";  recommend to follow the medication list provided after every visit or hospital admission, dispose of meds he does not need and call for RFs when appropiate. Referral to our clinical pharmacist failure 04-2023. Extensive coaching provided today. Home health: just gave  orders for OT, PT, skilled nursing, social work, speech therapy RTC 1 month

## 2023-09-10 ENCOUNTER — Other Ambulatory Visit: Payer: Self-pay | Admitting: Internal Medicine

## 2023-09-11 DIAGNOSIS — F419 Anxiety disorder, unspecified: Secondary | ICD-10-CM | POA: Diagnosis not present

## 2023-09-11 DIAGNOSIS — I509 Heart failure, unspecified: Secondary | ICD-10-CM | POA: Diagnosis not present

## 2023-09-11 DIAGNOSIS — M1990 Unspecified osteoarthritis, unspecified site: Secondary | ICD-10-CM | POA: Diagnosis not present

## 2023-09-11 DIAGNOSIS — G4700 Insomnia, unspecified: Secondary | ICD-10-CM | POA: Diagnosis not present

## 2023-09-11 DIAGNOSIS — I110 Hypertensive heart disease with heart failure: Secondary | ICD-10-CM | POA: Diagnosis not present

## 2023-09-11 DIAGNOSIS — F32A Depression, unspecified: Secondary | ICD-10-CM | POA: Diagnosis not present

## 2023-09-12 DIAGNOSIS — M1990 Unspecified osteoarthritis, unspecified site: Secondary | ICD-10-CM | POA: Diagnosis not present

## 2023-09-12 DIAGNOSIS — F419 Anxiety disorder, unspecified: Secondary | ICD-10-CM | POA: Diagnosis not present

## 2023-09-12 DIAGNOSIS — G4700 Insomnia, unspecified: Secondary | ICD-10-CM | POA: Diagnosis not present

## 2023-09-12 DIAGNOSIS — I110 Hypertensive heart disease with heart failure: Secondary | ICD-10-CM | POA: Diagnosis not present

## 2023-09-12 DIAGNOSIS — F32A Depression, unspecified: Secondary | ICD-10-CM | POA: Diagnosis not present

## 2023-09-12 DIAGNOSIS — I509 Heart failure, unspecified: Secondary | ICD-10-CM | POA: Diagnosis not present

## 2023-09-13 DIAGNOSIS — K5730 Diverticulosis of large intestine without perforation or abscess without bleeding: Secondary | ICD-10-CM | POA: Diagnosis not present

## 2023-09-13 DIAGNOSIS — F419 Anxiety disorder, unspecified: Secondary | ICD-10-CM | POA: Diagnosis not present

## 2023-09-13 DIAGNOSIS — I110 Hypertensive heart disease with heart failure: Secondary | ICD-10-CM | POA: Diagnosis not present

## 2023-09-13 DIAGNOSIS — R739 Hyperglycemia, unspecified: Secondary | ICD-10-CM | POA: Diagnosis not present

## 2023-09-13 DIAGNOSIS — I160 Hypertensive urgency: Secondary | ICD-10-CM | POA: Diagnosis not present

## 2023-09-13 DIAGNOSIS — M1990 Unspecified osteoarthritis, unspecified site: Secondary | ICD-10-CM | POA: Diagnosis not present

## 2023-09-13 DIAGNOSIS — G4700 Insomnia, unspecified: Secondary | ICD-10-CM | POA: Diagnosis not present

## 2023-09-13 DIAGNOSIS — Z8546 Personal history of malignant neoplasm of prostate: Secondary | ICD-10-CM | POA: Diagnosis not present

## 2023-09-13 DIAGNOSIS — I2510 Atherosclerotic heart disease of native coronary artery without angina pectoris: Secondary | ICD-10-CM | POA: Diagnosis not present

## 2023-09-13 DIAGNOSIS — E669 Obesity, unspecified: Secondary | ICD-10-CM | POA: Diagnosis not present

## 2023-09-13 DIAGNOSIS — I5033 Acute on chronic diastolic (congestive) heart failure: Secondary | ICD-10-CM | POA: Diagnosis not present

## 2023-09-13 DIAGNOSIS — F32A Depression, unspecified: Secondary | ICD-10-CM | POA: Diagnosis not present

## 2023-09-13 DIAGNOSIS — Z7984 Long term (current) use of oral hypoglycemic drugs: Secondary | ICD-10-CM | POA: Diagnosis not present

## 2023-09-13 DIAGNOSIS — Z85828 Personal history of other malignant neoplasm of skin: Secondary | ICD-10-CM | POA: Diagnosis not present

## 2023-09-13 DIAGNOSIS — R600 Localized edema: Secondary | ICD-10-CM | POA: Diagnosis not present

## 2023-09-13 DIAGNOSIS — G3184 Mild cognitive impairment, so stated: Secondary | ICD-10-CM | POA: Diagnosis not present

## 2023-09-13 DIAGNOSIS — E785 Hyperlipidemia, unspecified: Secondary | ICD-10-CM | POA: Diagnosis not present

## 2023-09-14 DIAGNOSIS — I160 Hypertensive urgency: Secondary | ICD-10-CM | POA: Diagnosis not present

## 2023-09-14 DIAGNOSIS — F419 Anxiety disorder, unspecified: Secondary | ICD-10-CM | POA: Diagnosis not present

## 2023-09-14 DIAGNOSIS — F32A Depression, unspecified: Secondary | ICD-10-CM | POA: Diagnosis not present

## 2023-09-14 DIAGNOSIS — E785 Hyperlipidemia, unspecified: Secondary | ICD-10-CM | POA: Diagnosis not present

## 2023-09-14 DIAGNOSIS — I110 Hypertensive heart disease with heart failure: Secondary | ICD-10-CM | POA: Diagnosis not present

## 2023-09-14 DIAGNOSIS — I5033 Acute on chronic diastolic (congestive) heart failure: Secondary | ICD-10-CM | POA: Diagnosis not present

## 2023-09-15 ENCOUNTER — Telehealth: Payer: Self-pay

## 2023-09-15 ENCOUNTER — Telehealth: Payer: Self-pay | Admitting: Internal Medicine

## 2023-09-15 MED ORDER — FUROSEMIDE 40 MG PO TABS: ORAL_TABLET | ORAL | 1 refills | Status: DC

## 2023-09-15 MED ORDER — SPIRONOLACTONE 25 MG PO TABS: 25.0000 mg | ORAL_TABLET | Freq: Every day | ORAL | 1 refills | Status: DC

## 2023-09-15 MED ORDER — EMPAGLIFLOZIN 10 MG PO TABS: 10.0000 mg | ORAL_TABLET | Freq: Every day | ORAL | 1 refills | Status: DC

## 2023-09-15 NOTE — Telephone Encounter (Signed)
Lasix Rx would not send electronically, faxed to [facility]

## 2023-09-15 NOTE — Telephone Encounter (Signed)
Plan of care signed and faxed back to [facility] at [phone] and [phone]. Form sent for scanning.

## 2023-09-15 NOTE — Telephone Encounter (Signed)
Rxs sent

## 2023-09-15 NOTE — Telephone Encounter (Signed)
Home health called and states called pharmacy to get refills last week   furosemide (LASIX) 40 MG tablet  spironolactone (ALDACTONE) 25 MG tablet    empagliflozin (JARDIANCE) 10 MG TABS tablet    Please send  to [facility] pharmacy

## 2023-09-18 DIAGNOSIS — I110 Hypertensive heart disease with heart failure: Secondary | ICD-10-CM | POA: Diagnosis not present

## 2023-09-18 DIAGNOSIS — I160 Hypertensive urgency: Secondary | ICD-10-CM | POA: Diagnosis not present

## 2023-09-18 DIAGNOSIS — I5033 Acute on chronic diastolic (congestive) heart failure: Secondary | ICD-10-CM | POA: Diagnosis not present

## 2023-09-18 DIAGNOSIS — E785 Hyperlipidemia, unspecified: Secondary | ICD-10-CM | POA: Diagnosis not present

## 2023-09-18 DIAGNOSIS — F32A Depression, unspecified: Secondary | ICD-10-CM | POA: Diagnosis not present

## 2023-09-18 DIAGNOSIS — F419 Anxiety disorder, unspecified: Secondary | ICD-10-CM | POA: Diagnosis not present

## 2023-09-19 DIAGNOSIS — I5033 Acute on chronic diastolic (congestive) heart failure: Secondary | ICD-10-CM | POA: Diagnosis not present

## 2023-09-19 DIAGNOSIS — E785 Hyperlipidemia, unspecified: Secondary | ICD-10-CM | POA: Diagnosis not present

## 2023-09-19 DIAGNOSIS — I110 Hypertensive heart disease with heart failure: Secondary | ICD-10-CM | POA: Diagnosis not present

## 2023-09-19 DIAGNOSIS — F419 Anxiety disorder, unspecified: Secondary | ICD-10-CM | POA: Diagnosis not present

## 2023-09-19 DIAGNOSIS — I160 Hypertensive urgency: Secondary | ICD-10-CM | POA: Diagnosis not present

## 2023-09-19 DIAGNOSIS — F32A Depression, unspecified: Secondary | ICD-10-CM | POA: Diagnosis not present

## 2023-09-20 DIAGNOSIS — I160 Hypertensive urgency: Secondary | ICD-10-CM | POA: Diagnosis not present

## 2023-09-20 DIAGNOSIS — F419 Anxiety disorder, unspecified: Secondary | ICD-10-CM | POA: Diagnosis not present

## 2023-09-20 DIAGNOSIS — I110 Hypertensive heart disease with heart failure: Secondary | ICD-10-CM | POA: Diagnosis not present

## 2023-09-20 DIAGNOSIS — I5033 Acute on chronic diastolic (congestive) heart failure: Secondary | ICD-10-CM | POA: Diagnosis not present

## 2023-09-20 DIAGNOSIS — F32A Depression, unspecified: Secondary | ICD-10-CM | POA: Diagnosis not present

## 2023-09-20 DIAGNOSIS — E785 Hyperlipidemia, unspecified: Secondary | ICD-10-CM | POA: Diagnosis not present

## 2023-09-22 ENCOUNTER — Encounter: Payer: Self-pay | Admitting: Internal Medicine

## 2023-09-22 DIAGNOSIS — F322 Major depressive disorder, single episode, severe without psychotic features: Secondary | ICD-10-CM

## 2023-09-22 DIAGNOSIS — G609 Hereditary and idiopathic neuropathy, unspecified: Secondary | ICD-10-CM

## 2023-09-25 DIAGNOSIS — E785 Hyperlipidemia, unspecified: Secondary | ICD-10-CM | POA: Diagnosis not present

## 2023-09-25 DIAGNOSIS — I5033 Acute on chronic diastolic (congestive) heart failure: Secondary | ICD-10-CM | POA: Diagnosis not present

## 2023-09-25 DIAGNOSIS — F419 Anxiety disorder, unspecified: Secondary | ICD-10-CM | POA: Diagnosis not present

## 2023-09-25 DIAGNOSIS — I110 Hypertensive heart disease with heart failure: Secondary | ICD-10-CM | POA: Diagnosis not present

## 2023-09-25 DIAGNOSIS — I160 Hypertensive urgency: Secondary | ICD-10-CM | POA: Diagnosis not present

## 2023-09-25 DIAGNOSIS — F32A Depression, unspecified: Secondary | ICD-10-CM | POA: Diagnosis not present

## 2023-09-25 MED ORDER — DONEPEZIL HCL 5 MG PO TABS: 5.0000 mg | ORAL_TABLET | Freq: Every day | ORAL | 1 refills | Status: AC

## 2023-09-25 MED ORDER — POTASSIUM CHLORIDE CRYS ER 20 MEQ PO TBCR: 20.0000 meq | EXTENDED_RELEASE_TABLET | Freq: Every day | ORAL | 1 refills | Status: DC

## 2023-09-25 MED ORDER — FENOFIBRATE MICRONIZED 134 MG PO CAPS: 134.0000 mg | ORAL_CAPSULE | Freq: Every day | ORAL | 1 refills | Status: AC

## 2023-09-25 MED ORDER — EMPAGLIFLOZIN 10 MG PO TABS: 10.0000 mg | ORAL_TABLET | Freq: Every day | ORAL | 1 refills | Status: AC

## 2023-09-25 MED ORDER — SERTRALINE HCL 50 MG PO TABS: 50.0000 mg | ORAL_TABLET | Freq: Every day | ORAL | 1 refills | Status: AC

## 2023-09-25 MED ORDER — SPIRONOLACTONE 25 MG PO TABS: 25.0000 mg | ORAL_TABLET | Freq: Every day | ORAL | 1 refills | Status: AC

## 2023-09-25 MED ORDER — FUROSEMIDE 40 MG PO TABS: ORAL_TABLET | ORAL | 1 refills | Status: DC

## 2023-09-25 MED ORDER — ATORVASTATIN CALCIUM 40 MG PO TABS: 40.0000 mg | ORAL_TABLET | Freq: Every day | ORAL | 1 refills | Status: AC

## 2023-09-28 DIAGNOSIS — I160 Hypertensive urgency: Secondary | ICD-10-CM | POA: Diagnosis not present

## 2023-09-28 DIAGNOSIS — I5033 Acute on chronic diastolic (congestive) heart failure: Secondary | ICD-10-CM | POA: Diagnosis not present

## 2023-09-28 DIAGNOSIS — F32A Depression, unspecified: Secondary | ICD-10-CM | POA: Diagnosis not present

## 2023-09-28 DIAGNOSIS — E785 Hyperlipidemia, unspecified: Secondary | ICD-10-CM | POA: Diagnosis not present

## 2023-09-28 DIAGNOSIS — F419 Anxiety disorder, unspecified: Secondary | ICD-10-CM | POA: Diagnosis not present

## 2023-09-28 DIAGNOSIS — I110 Hypertensive heart disease with heart failure: Secondary | ICD-10-CM | POA: Diagnosis not present

## 2023-09-29 DIAGNOSIS — I160 Hypertensive urgency: Secondary | ICD-10-CM | POA: Diagnosis not present

## 2023-09-29 DIAGNOSIS — F32A Depression, unspecified: Secondary | ICD-10-CM | POA: Diagnosis not present

## 2023-09-29 DIAGNOSIS — F419 Anxiety disorder, unspecified: Secondary | ICD-10-CM | POA: Diagnosis not present

## 2023-09-29 DIAGNOSIS — I5033 Acute on chronic diastolic (congestive) heart failure: Secondary | ICD-10-CM | POA: Diagnosis not present

## 2023-09-29 DIAGNOSIS — I110 Hypertensive heart disease with heart failure: Secondary | ICD-10-CM | POA: Diagnosis not present

## 2023-09-29 DIAGNOSIS — E785 Hyperlipidemia, unspecified: Secondary | ICD-10-CM | POA: Diagnosis not present

## 2023-10-02 DIAGNOSIS — F32A Depression, unspecified: Secondary | ICD-10-CM | POA: Diagnosis not present

## 2023-10-02 DIAGNOSIS — F419 Anxiety disorder, unspecified: Secondary | ICD-10-CM | POA: Diagnosis not present

## 2023-10-02 DIAGNOSIS — I110 Hypertensive heart disease with heart failure: Secondary | ICD-10-CM | POA: Diagnosis not present

## 2023-10-02 DIAGNOSIS — I160 Hypertensive urgency: Secondary | ICD-10-CM | POA: Diagnosis not present

## 2023-10-02 DIAGNOSIS — I5033 Acute on chronic diastolic (congestive) heart failure: Secondary | ICD-10-CM | POA: Diagnosis not present

## 2023-10-02 DIAGNOSIS — E785 Hyperlipidemia, unspecified: Secondary | ICD-10-CM | POA: Diagnosis not present

## 2023-10-02 MED ORDER — GABAPENTIN 100 MG PO CAPS: 100.0000 mg | ORAL_CAPSULE | Freq: Three times a day (TID) | ORAL | 0 refills | Status: AC

## 2023-10-02 MED ORDER — POTASSIUM CHLORIDE CRYS ER 20 MEQ PO TBCR: 20.0000 meq | EXTENDED_RELEASE_TABLET | Freq: Every day | ORAL | 1 refills | Status: DC

## 2023-10-03 DIAGNOSIS — I160 Hypertensive urgency: Secondary | ICD-10-CM | POA: Diagnosis not present

## 2023-10-03 DIAGNOSIS — F419 Anxiety disorder, unspecified: Secondary | ICD-10-CM | POA: Diagnosis not present

## 2023-10-03 DIAGNOSIS — I5033 Acute on chronic diastolic (congestive) heart failure: Secondary | ICD-10-CM | POA: Diagnosis not present

## 2023-10-03 DIAGNOSIS — I110 Hypertensive heart disease with heart failure: Secondary | ICD-10-CM | POA: Diagnosis not present

## 2023-10-03 DIAGNOSIS — F32A Depression, unspecified: Secondary | ICD-10-CM | POA: Diagnosis not present

## 2023-10-03 DIAGNOSIS — E785 Hyperlipidemia, unspecified: Secondary | ICD-10-CM | POA: Diagnosis not present

## 2023-10-09 ENCOUNTER — Telehealth: Payer: Self-pay | Admitting: Pharmacist

## 2023-10-09 ENCOUNTER — Encounter: Payer: Self-pay | Admitting: Internal Medicine

## 2023-10-09 ENCOUNTER — Ambulatory Visit (INDEPENDENT_AMBULATORY_CARE_PROVIDER_SITE_OTHER): Payer: Medicare Other | Admitting: Internal Medicine

## 2023-10-09 VITALS — BP 126/66 | HR 96 | Temp 97.6°F | Resp 22 | Ht 70.0 in | Wt 239.0 lb

## 2023-10-09 DIAGNOSIS — Z23 Encounter for immunization: Secondary | ICD-10-CM | POA: Diagnosis not present

## 2023-10-09 DIAGNOSIS — I5032 Chronic diastolic (congestive) heart failure: Secondary | ICD-10-CM | POA: Diagnosis not present

## 2023-10-09 DIAGNOSIS — Z91199 Patient's noncompliance with other medical treatment and regimen due to unspecified reason: Secondary | ICD-10-CM | POA: Diagnosis not present

## 2023-10-09 NOTE — Patient Instructions (Addendum)
Take the medications as prescribed, follow-up the medication list provided  Weight yourself every day, keep a log. If you notice your weight is going up by 2 to 3 pounds, let us know.  Next visit in 1 month, please make an appointment

## 2023-10-09 NOTE — Telephone Encounter (Signed)
Tried to scheduled patient in July and August 2024 to see Clinical Pharmacist Practitioner but had 3 unsuccessful attempt. Dr Drue Novel saw patient today and asked again if Clinical Pharmacist Practitioner could assist with medication management. Will forward to scheduler to try to contact patient or his daughter. Wonda Horner [phone].  If we an get a face to face appointment that would be preferred but if unable, can do a phone appointment.

## 2023-10-09 NOTE — Addendum Note (Signed)
Addended by: Willow Ora E on: 10/09/2023 03:26 PM   Modules accepted: Level of Service

## 2023-10-09 NOTE — Progress Notes (Addendum)
Subjective:    Patient ID: Larry Elliott, male    DOB: June 15, 1946, 77 y.o.   MRN: [id number]  DOS:  10/09/2023 Type of visit - description: Follow-up, here with his wife  Reports he is feeling well. Weight noted to be elevated. This morning at home his weight was 237 pounds, does not recall other readings. Shortness of breath at baseline No fever chills  Wt Readings from Last 3 Encounters:  10/09/23 239 lb (108.4 kg)  09/06/23 233 lb 6 oz (105.9 kg)  08/31/23 238 lb (108 kg)     Review of Systems See above   Past Medical History:  Diagnosis Date   Allergy    penicillin & Aspirin   Anxiety 11/04/2013   Arthritis    BCC (basal cell carcinoma), leg    Right calf, L nape of neck at hairline   CAD (coronary artery disease)    CP, cath, non-obstructive   Cataract    been removed   Depression 11/04/2013   Diverticulosis    colon   Erectile dysfunction following radical prostatectomy    Hyperlipidemia    Hypertension    Insomnia    Melanoma (HCC) 2010   face, sees derm    Prostate CA (HCC) 07/2007   s/p robotic surgery   Spermatocele    Left    Past Surgical History:  Procedure Laterality Date   APPENDECTOMY     age 89   EYE SURGERY     cataracts removed   HERNIA REPAIR  2010   JOINT REPLACEMENT  hip 2024   KNEE ARTHROSCOPY Left 1965   meniscal tear   PROSTATECTOMY  07/25/2007    robotic,  for prostate cancer     SHOULDER ARTHROSCOPY Left 07/09/2013   Dr Ave Filter    SPINE SURGERY     TOTAL HIP ARTHROPLASTY Left 12/27/2022   Procedure: LEFT TOTAL HIP ARTHROPLASTY ANTERIOR APPROACH;  Surgeon: Marcene Corning, MD;  Location: [facility];  Service: Orthopedics;  Laterality: Left;   TRANSFORAMINAL LUMBAR INTERBODY FUSION (TLIF) WITH PEDICLE SCREW FIXATION 1 LEVEL Right 12/04/2019   Procedure: RIGHT-SIDED TRANSFORAMINAL LUMBAR INTERBODY FUSION LUMBAR FOUR THROUGH FIVE WITH INSTRUMENTATION AND ALLOGRAFT;  Surgeon: Estill Bamberg, MD;  Location: [facility];  Service:  Orthopedics;  Laterality: Right;    Current Outpatient Medications  Medication Instructions   atorvastatin (LIPITOR) 40 mg, Oral, Daily at bedtime   donepezil (ARICEPT) 5 mg, Oral, Daily at bedtime   doxycycline (VIBRA-TABS) 100 mg, Oral, 2 times daily   empagliflozin (JARDIANCE) 10 mg, Oral, Daily   fenofibrate micronized (LOFIBRA) 134 mg, Oral, Daily before breakfast   furosemide (LASIX) 40 MG tablet 1 tablet every morning., Take an additional tablet at 1 PM on Monday Wednesday and Fridays   gabapentin (NEURONTIN) 100 mg, Oral, 3 times daily   melatonin 5 mg, Oral, Daily at bedtime   ondansetron (ZOFRAN) 4 mg, Oral, Every 8 hours PRN   potassium chloride SA (KLOR-CON M) 20 MEQ tablet 20 mEq, Oral, Daily   sertraline (ZOLOFT) 50 mg, Oral, Daily   spironolactone (ALDACTONE) 25 mg, Oral, Daily       Objective:   Physical Exam BP 126/66   Pulse 96   Temp 97.6 F (36.4 C) (Oral)   Resp (!) 22   Ht 5\' 10"  (1.778 m)   Wt 239 lb (108.4 kg)   SpO2 94%   BMI 34.29 kg/m  General:   Well developed, NAD, BMI noted. HEENT:  Normocephalic . Face symmetric, atraumatic  Lungs:  CTA B Normal respiratory effort, no intercostal retractions, no accessory muscle use. Heart: RRR,  no murmur.  See picture Skin: Not pale. Not jaundice Neurologic:  alert & oriented X3.  Speech normal, gait assisted by cane, somewhat limited by shortness of breath (at baseline Psych--  Cognition and judgment appear intact.  Cooperative with normal attention span and concentration.  Behavior appropriate. No anxious or depressed appearing.        Assessment     Assessment  Hyperglycemia   HTN Hyperlipidemia Anxiety depression insomnia   dc clonazepam - not effective,  8-16, rx trazodone. Metabolic encephalopathy admitted July 2023: No more Ambien  CV: --CAD, non-obstructive ---LE edema R>L (-) US DVT 2020 ---ABI 09/2019 WNL -- Diastolic CHF suspected, echo 02/2023 normal Prostate cancer-- 2008,  robotic surgery, no incontinence, Dr Laverle Patter: now f/u per PCP as of 05-2018 [facility] ED past surgery  spermatocele, left Skin --Melanoma 2010, BCCs  does not see derm regularly as off 01-2022   PLAN Lower extremity edema, CHF: Although his weight is 6 pounds higher compared to the last visit, the lower extremity edema is only slightly worse, see picture.  SOB at baseline. See next. Med compliance: At the last visit, the wife Larry Elliott (wife) told me that she was in charge of her meds, today she tells me she is not. Also, my nurse work with Larry Elliott to get Larry Elliott set up with pill pack but the patient had no idea what pill pack is.  They still rely on [facility] for their meds (per patient). His son Larry Elliott lives with them but he is not interested in getting involved according to Larry Elliott and his wife. Plan: Provided med list, encouraged to get daily weights and keep a log, refer to our clinical pharmacist Addendum 10/10/2023: spoke w/ Larry Elliott: Larry Elliott  is actually helping Larry Elliott with his medications, he still has a once a week home health nurse visit, pillpack was set up and will start October 25, 2023, she is planning to hire a caregiver to visit them 3 times a week  to help with medication compliance but most important she is  working Chief Strategy Officer her parents on an assisted living place.   RTC 1 month

## 2023-10-09 NOTE — Assessment & Plan Note (Addendum)
Lower extremity edema, CHF: Although his weight is 6 pounds higher compared to the last visit, the lower extremity edema is only slightly worse, see picture.  SOB at baseline. See next. Med compliance: At the last visit, the wife Larry Elliott (wife) told me that she was in charge of her meds, today she tells me she is not. Also, my nurse work with [name] to get Larry Elliott set up with pill pack but the patient had no idea what pill pack is.  They still rely on [facility] for their meds (per patient). His son Larry Elliott lives with them but he is not interested in getting involved according to Larry Elliott and his wife. Plan: Provided med list, encouraged to get daily weights and keep a log, refer to our clinical pharmacist Addendum 10/10/2023: spoke w/ [name]: Larry Elliott  is actually helping Larry Elliott with his medications, he still has a once a week home health nurse visit, pillpack was set up and will start October 25, 2023, she is planning to hire a caregiver to visit them 3 times a week  to help with medication compliance but most important she is  working Chief Strategy Officer her parents on an assisted living place.  RTC 1 month

## 2023-10-12 ENCOUNTER — Encounter (HOSPITAL_COMMUNITY): Payer: Self-pay

## 2023-10-12 ENCOUNTER — Emergency Department (HOSPITAL_COMMUNITY): Payer: Medicare Other

## 2023-10-12 ENCOUNTER — Encounter: Payer: Self-pay | Admitting: Internal Medicine

## 2023-10-12 ENCOUNTER — Other Ambulatory Visit: Payer: Self-pay

## 2023-10-12 ENCOUNTER — Telehealth: Payer: Self-pay

## 2023-10-12 ENCOUNTER — Inpatient Hospital Stay (HOSPITAL_COMMUNITY)
Admission: EM | Admit: 2023-10-12 | Discharge: 2023-10-15 | DRG: 291 | Disposition: A | Discharge status: Home Health | Payer: Medicare Other | Attending: Internal Medicine | Admitting: Internal Medicine

## 2023-10-12 DIAGNOSIS — Z66 Do not resuscitate: Secondary | ICD-10-CM | POA: Diagnosis present

## 2023-10-12 DIAGNOSIS — F32A Depression, unspecified: Secondary | ICD-10-CM | POA: Diagnosis not present

## 2023-10-12 DIAGNOSIS — Z85828 Personal history of other malignant neoplasm of skin: Secondary | ICD-10-CM | POA: Diagnosis not present

## 2023-10-12 DIAGNOSIS — Z885 Allergy status to narcotic agent status: Secondary | ICD-10-CM | POA: Diagnosis not present

## 2023-10-12 DIAGNOSIS — E781 Pure hyperglyceridemia: Secondary | ICD-10-CM | POA: Diagnosis present

## 2023-10-12 DIAGNOSIS — Z6833 Body mass index (BMI) 33.0-33.9, adult: Secondary | ICD-10-CM | POA: Diagnosis not present

## 2023-10-12 DIAGNOSIS — E782 Mixed hyperlipidemia: Secondary | ICD-10-CM | POA: Diagnosis not present

## 2023-10-12 DIAGNOSIS — I712 Thoracic aortic aneurysm, without rupture, unspecified: Secondary | ICD-10-CM | POA: Diagnosis present

## 2023-10-12 DIAGNOSIS — I10 Essential (primary) hypertension: Secondary | ICD-10-CM | POA: Diagnosis present

## 2023-10-12 DIAGNOSIS — J9601 Acute respiratory failure with hypoxia: Secondary | ICD-10-CM | POA: Diagnosis present

## 2023-10-12 DIAGNOSIS — Z96642 Presence of left artificial hip joint: Secondary | ICD-10-CM | POA: Diagnosis present

## 2023-10-12 DIAGNOSIS — Z7984 Long term (current) use of oral hypoglycemic drugs: Secondary | ICD-10-CM | POA: Diagnosis not present

## 2023-10-12 DIAGNOSIS — G8929 Other chronic pain: Secondary | ICD-10-CM | POA: Diagnosis present

## 2023-10-12 DIAGNOSIS — Z981 Arthrodesis status: Secondary | ICD-10-CM

## 2023-10-12 DIAGNOSIS — E669 Obesity, unspecified: Secondary | ICD-10-CM | POA: Diagnosis present

## 2023-10-12 DIAGNOSIS — J9811 Atelectasis: Secondary | ICD-10-CM | POA: Diagnosis not present

## 2023-10-12 DIAGNOSIS — I110 Hypertensive heart disease with heart failure: Secondary | ICD-10-CM | POA: Diagnosis not present

## 2023-10-12 DIAGNOSIS — Z888 Allergy status to other drugs, medicaments and biological substances status: Secondary | ICD-10-CM

## 2023-10-12 DIAGNOSIS — Z85820 Personal history of malignant melanoma of skin: Secondary | ICD-10-CM

## 2023-10-12 DIAGNOSIS — I517 Cardiomegaly: Secondary | ICD-10-CM | POA: Diagnosis not present

## 2023-10-12 DIAGNOSIS — E785 Hyperlipidemia, unspecified: Secondary | ICD-10-CM | POA: Diagnosis present

## 2023-10-12 DIAGNOSIS — R0989 Other specified symptoms and signs involving the circulatory and respiratory systems: Secondary | ICD-10-CM | POA: Diagnosis not present

## 2023-10-12 DIAGNOSIS — I5033 Acute on chronic diastolic (congestive) heart failure: Secondary | ICD-10-CM | POA: Diagnosis present

## 2023-10-12 DIAGNOSIS — Z79899 Other long term (current) drug therapy: Secondary | ICD-10-CM

## 2023-10-12 DIAGNOSIS — E876 Hypokalemia: Secondary | ICD-10-CM | POA: Diagnosis present

## 2023-10-12 DIAGNOSIS — Z8546 Personal history of malignant neoplasm of prostate: Secondary | ICD-10-CM

## 2023-10-12 DIAGNOSIS — H9190 Unspecified hearing loss, unspecified ear: Secondary | ICD-10-CM | POA: Diagnosis present

## 2023-10-12 DIAGNOSIS — R0902 Hypoxemia: Principal | ICD-10-CM

## 2023-10-12 DIAGNOSIS — I7121 Aneurysm of the ascending aorta, without rupture: Secondary | ICD-10-CM | POA: Diagnosis not present

## 2023-10-12 DIAGNOSIS — M25551 Pain in right hip: Secondary | ICD-10-CM | POA: Diagnosis not present

## 2023-10-12 DIAGNOSIS — R0602 Shortness of breath: Secondary | ICD-10-CM | POA: Diagnosis not present

## 2023-10-12 DIAGNOSIS — R627 Adult failure to thrive: Secondary | ICD-10-CM | POA: Diagnosis present

## 2023-10-12 DIAGNOSIS — Z880 Allergy status to penicillin: Secondary | ICD-10-CM

## 2023-10-12 DIAGNOSIS — I2510 Atherosclerotic heart disease of native coronary artery without angina pectoris: Secondary | ICD-10-CM | POA: Diagnosis present

## 2023-10-12 DIAGNOSIS — Z823 Family history of stroke: Secondary | ICD-10-CM

## 2023-10-12 DIAGNOSIS — I160 Hypertensive urgency: Secondary | ICD-10-CM | POA: Diagnosis not present

## 2023-10-12 DIAGNOSIS — Z8249 Family history of ischemic heart disease and other diseases of the circulatory system: Secondary | ICD-10-CM

## 2023-10-12 DIAGNOSIS — R609 Edema, unspecified: Secondary | ICD-10-CM | POA: Diagnosis not present

## 2023-10-12 DIAGNOSIS — Z886 Allergy status to analgesic agent status: Secondary | ICD-10-CM

## 2023-10-12 DIAGNOSIS — F419 Anxiety disorder, unspecified: Secondary | ICD-10-CM | POA: Diagnosis not present

## 2023-10-12 LAB — COMPREHENSIVE METABOLIC PANEL
ALT: 18 U/L (ref 0–44)
AST: 19 U/L (ref 15–41)
Albumin: 2.7 g/dL — ABNORMAL LOW (ref 3.5–5.0)
Alkaline Phosphatase: 57 U/L (ref 38–126)
Anion gap: 9 (ref 5–15)
BUN: 16 mg/dL (ref 8–23)
CO2: 23 mmol/L (ref 22–32)
Calcium: 8.9 mg/dL (ref 8.9–10.3)
Chloride: 107 mmol/L (ref 98–111)
Creatinine, Ser: 0.75 mg/dL (ref 0.61–1.24)
GFR, Estimated: >60 mL/min (ref >60)
Glucose, Bld: 98 mg/dL (ref 70–99)
Potassium: 4 mmol/L (ref 3.5–5.1)
Sodium: 139 mmol/L (ref 135–145)
Total Bilirubin: 0.5 mg/dL (ref <1.2)
Total Protein: 5.5 g/dL — ABNORMAL LOW (ref 6.5–8.1)

## 2023-10-12 LAB — CBC WITH DIFFERENTIAL/PLATELET
Abs Immature Granulocytes: 0.17 10*3/uL — ABNORMAL HIGH (ref 0.00–0.07)
Basophils Absolute: 0.1 10*3/uL (ref 0.0–0.1)
Basophils Relative: 1 %
Eosinophils Absolute: 0.3 10*3/uL (ref 0.0–0.5)
Eosinophils Relative: 3 %
HCT: 39.9 % (ref 39.0–52.0)
Hemoglobin: 12.5 g/dL — ABNORMAL LOW (ref 13.0–17.0)
Immature Granulocytes: 2 %
Lymphocytes Relative: 11 %
Lymphs Abs: 1.2 10*3/uL (ref 0.7–4.0)
MCH: 27.9 pg (ref 26.0–34.0)
MCHC: 31.3 g/dL (ref 30.0–36.0)
MCV: 89.1 fL (ref 80.0–100.0)
Monocytes Absolute: 1 10*3/uL (ref 0.1–1.0)
Monocytes Relative: 10 %
Neutro Abs: 7.5 10*3/uL (ref 1.7–7.7)
Neutrophils Relative %: 73 %
Platelets: 362 10*3/uL (ref 150–400)
RBC: 4.48 MIL/uL (ref 4.22–5.81)
RDW: 15.8 % — ABNORMAL HIGH (ref 11.5–15.5)
WBC: 10.3 10*3/uL (ref 4.0–10.5)
nRBC: 0 % (ref 0.0–0.2)

## 2023-10-12 LAB — TROPONIN I (HIGH SENSITIVITY): Troponin I (High Sensitivity): 5 ng/L (ref <18)

## 2023-10-12 LAB — BRAIN NATRIURETIC PEPTIDE: B Natriuretic Peptide: 40.1 pg/mL (ref 0.0–100.0)

## 2023-10-12 MED ORDER — FUROSEMIDE 10 MG/ML IJ SOLN
40.0000 mg | Freq: Once | INTRAMUSCULAR | Status: AC
  Administered 2023-10-12: 40 mg via INTRAVENOUS
  Filled 2023-10-12: qty 4

## 2023-10-12 MED ORDER — IOHEXOL 350 MG/ML SOLN
75.0000 mL | Freq: Once | INTRAVENOUS | Status: AC | PRN
  Administered 2023-10-12: 75 mL via INTRAVENOUS

## 2023-10-12 MED ORDER — FUROSEMIDE 10 MG/ML IJ SOLN: 80.0000 mg | Freq: Once | INTRAMUSCULAR | Status: DC

## 2023-10-12 NOTE — ED Provider Notes (Signed)
[place] EMERGENCY DEPARTMENT AT [facility] Provider Note  HPI   Larry Elliott is a 77 y.o. male patient with a PMHx of HFpEF who is here today with concern for shortness of breath.  Patient states he lives at home with his wife, has been taking all of his medicines accordingly, however over the past couple of days, he has been having worsening shortness of breath and also lower extremity edema.  Denies fever denies abdominal pain denies back pain denies urinary symptoms  ROS Negative except as per HPI   Medical Decision Making   Upon presentation, the patient is afebrile however he is tachypneic, slightly hypertensive, He has signs of fluid overload on exam  For this patient, labs ordered prior to me seeing the patient.  I am looking at them now, he has a troponin of 5, no major metabolic derangements on metabolic panel, albumin 2.7 which is low, hemoglobin is 12.5, which is down from 14.  We are still pending BNP.  EKG shows sinus rhythm but no signs of ischemia or arrhythmia.   Will get a second troponin, this patient is now on 5 L nasal cannula, presumed fluid overload due to HF, will give IV lasix at this time.   To be 7 chest x-ray does show some signs of fluid, we are still waiting on a second troponin as well, we did decide to add on a pulmonary embolism scan as well given the patient's significant tachypnea and work of breathing and high oxygen requirement.  Hopefully Lasix will help reduce his oxygen burden as well.  I have transferred care of this patient to the incoming emergency medicine resident at the end of my shift. I have discussed the patient's HPI, physical exam, and workup and therapeutics with the incoming team. Please see their documentation for the remainder of care. Patient is pending CT PE.    No diagnosis found.  @DISPOSITION @  Rx / DC Orders ED Discharge Orders     None        Past Medical History:  Diagnosis Date   Allergy     penicillin & Aspirin   Anxiety 11/04/2013   Arthritis    BCC (basal cell carcinoma), leg    Right calf, L nape of neck at hairline   CAD (coronary artery disease)    CP, cath, non-obstructive   Cataract    been removed   Depression 11/04/2013   Diverticulosis    colon   Erectile dysfunction following radical prostatectomy    Hyperlipidemia    Hypertension    Insomnia    Melanoma (HCC) 2010   face, sees derm    Prostate CA (HCC) 07/2007   s/p robotic surgery   Spermatocele    Left   Past Surgical History:  Procedure Laterality Date   APPENDECTOMY     age 45   EYE SURGERY     cataracts removed   HERNIA REPAIR  2010   JOINT REPLACEMENT  hip 2024   KNEE ARTHROSCOPY Left 1965   meniscal tear   PROSTATECTOMY  07/25/2007    robotic,  for prostate cancer     SHOULDER ARTHROSCOPY Left 07/09/2013   Dr Ave Filter    SPINE SURGERY     TOTAL HIP ARTHROPLASTY Left 12/27/2022   Procedure: LEFT TOTAL HIP ARTHROPLASTY ANTERIOR APPROACH;  Surgeon: Marcene Corning, MD;  Location: [facility];  Service: Orthopedics;  Laterality: Left;   TRANSFORAMINAL LUMBAR INTERBODY FUSION (TLIF) WITH PEDICLE SCREW FIXATION 1 LEVEL  Right 12/04/2019   Procedure: RIGHT-SIDED TRANSFORAMINAL LUMBAR INTERBODY FUSION LUMBAR FOUR THROUGH FIVE WITH INSTRUMENTATION AND ALLOGRAFT;  Surgeon: Estill Bamberg, MD;  Location: [facility];  Service: Orthopedics;  Laterality: Right;   Family History  Problem Relation Age of Onset   Coronary artery disease Father 11       dx age 66s   Stroke Mother    Hypertension Neg Hx    Diabetes Neg Hx    Colon cancer Neg Hx    Prostate cancer Neg Hx    Social History   Socioeconomic History   Marital status: Married    Spouse name: Not on file   Number of children: 2   Years of education: Not on file   Highest education level: Some college, no degree  Occupational History   Occupation: retired Emergency planning/management officer   Tobacco Use   Smoking status: Never   Smokeless tobacco: Never   Vaping Use   Vaping status: Never Used  Substance and Sexual Activity   Alcohol use: Not Currently    Alcohol/week: 1.0 standard drink of alcohol    Types: 1 Glasses of wine per week    Comment: fruit glass size   Drug use: Never   Sexual activity: Not on file  Other Topics Concern   Not on file  Social History Narrative   Lives w/ wife    Original from [place]   Daughter [name] lives in [place] lives w/ Jonny Ruiz   Social Drivers of Health   Financial Resource Strain: Low Risk  (10/11/2023)   Overall Financial Resource Strain (CARDIA)    Difficulty of Paying Living Expenses: Not hard at all  Food Insecurity: No Food Insecurity (10/11/2023)   Hunger Vital Sign    Worried About Running Out of Food in the Last Year: Never true    Ran Out of Food in the Last Year: Never true  Transportation Needs: No Transportation Needs (10/11/2023)   PRAPARE - Administrator, Civil Service (Medical): No    Lack of Transportation (Non-Medical): No  Physical Activity: Unknown (10/11/2023)   Exercise Vital Sign    Days of Exercise per Week: 0 days    Minutes of Exercise per Session: Not on file  Stress: Stress Concern Present (10/11/2023)   [employer] of Occupational Health - Occupational Stress Questionnaire    Feeling of Stress : Rather much  Social Connections: Socially Isolated (10/11/2023)   Social Connection and Isolation Panel [NHANES]    Frequency of Communication with Friends and Family: Never    Frequency of Social Gatherings with Friends and Family: Once a week    Attends Religious Services: Never    Database administrator or Organizations: No    Attends Engineer, structural: Not on file    Marital Status: Married  Catering manager Violence: Not At Risk (08/17/2023)   Humiliation, Afraid, Rape, and Kick questionnaire    Fear of Current or Ex-Partner: No    Emotionally Abused: No    Physically Abused: No    Sexually Abused: No     Physical  Exam   Vitals:   10/12/23 2145 10/12/23 2215 10/12/23 2302 10/12/23 2342  BP: (!) 151/88 (!) 166/85  118/78  Pulse: 73 75  77  Resp: 20 (!) 29  (!) 21  Temp:   (!) 97.3 F (36.3 C)   TempSrc:   Oral   SpO2: 100% 100%  100%  Weight:  Height:        Physical Exam Vitals and nursing note reviewed.  Constitutional:      General: He is not in acute distress.    Appearance: Normal appearance. He is well-developed. He is not ill-appearing or toxic-appearing.  HENT:     Head: Normocephalic and atraumatic.     Right Ear: External ear normal.     Left Ear: External ear normal.     Nose: Nose normal.     Mouth/Throat:     Mouth: Mucous membranes are moist.  Eyes:     Extraocular Movements: Extraocular movements intact.     Pupils: Pupils are equal, round, and reactive to light.  Cardiovascular:     Rate and Rhythm: Normal rate.     Pulses: Normal pulses.  Pulmonary:     Effort: No respiratory distress.     Breath sounds: Normal breath sounds. No stridor. No wheezing, rhonchi or rales.     Comments: Tachypnea, decreased aeration, however I did not hear any focal lung sounds Abdominal:     General: There is distension.     Palpations: Abdomen is soft.     Tenderness: There is no abdominal tenderness. There is no right CVA tenderness or left CVA tenderness.  Musculoskeletal:        General: Normal range of motion.     Cervical back: Normal range of motion and neck supple.     Comments: 2+ pitting edema up to the knees bilaterally  Skin:    General: Skin is warm and dry.     Capillary Refill: Capillary refill takes less than 2 seconds.  Neurological:     General: No focal deficit present.     Mental Status: He is alert. Mental status is at baseline.     Comments: Hard of hearing, oriented x 3  Psychiatric:        Mood and Affect: Mood normal.      Procedures   If procedures were preformed on this patient, they are listed below:  Procedures  The patient was seen,  evaluated, and treated in conjunction with the attending physician, who voiced agreement in the care provided.  Note generated using Dragon voice dictation software and may contain dictation errors. Please contact me for any clarification or with any questions.   Electronically signed by:  Osvaldo Shipper, M.D. (PGY-2)    Gunnar Bulla, MD 10/12/23 2353    Melene Plan, DO 10/13/23 1605

## 2023-10-12 NOTE — Telephone Encounter (Signed)
[facility]- she called to get verbal orders for visit. Also, wanted to make PCP aware that Pt's BP is 178/84, resp rate in the 30s, and having hard time keeping eyes open and having worsening edema in legs. She informed that Pt and family (wife and son, Kathlene November) are on speaker phone, Pt's son, Kathlene November yelling stating he couldn't understand how he is "okay" at visit on Monday and not well now. Informed that medication compliance again was raised at visit on Monday, and PCP and [name], Pt's daughter have been in contact throughout the week. I recommended Pt's family to call EMS and be taken to ED for eval d/t worsening condition. Pt's son again raising voice and stating he doesn't believe its necessary, "he just saw PCP on Monday.'' Kathlene November states that he was informed by Pt and his wife Diannia Ruder on Monday when he got home from work that OV w/ PCP went fine. Kathlene November informs that Pt drinks a gallon of milk or chocolate milk daily, and eats fruits and other foods high in sugar and can't get Pt to stop. I recommended that Pt and his family (wife, son, and daughter) make an appt w/ PCP and all come in together for a visit to help with miscommunication. Toniann Fail and I made Pt and family aware of potential stroke, heart attack, and even death if he is not evaluated. Toniann Fail informed that she would call them to come to house and evaluate. Call disconnected.   I called PCP and spoke w/ him at length about this conversation above.

## 2023-10-12 NOTE — ED Notes (Signed)
This RN unsuccessful at IV insertion for CTA.

## 2023-10-12 NOTE — ED Notes (Signed)
Pt to CT

## 2023-10-12 NOTE — ED Provider Notes (Signed)
  Physical Exam  BP (!) 166/85   Pulse 75   Temp (!) 97.3 F (36.3 C) (Oral)   Resp (!) 29   Ht 5\' 10"  (1.778 m)   Wt 104.3 kg   SpO2 100%   BMI 33.00 kg/m   Physical Exam  Procedures  Procedures  ED Course / MDM   Clinical Course as of 10/13/23 0202  Fri Oct 13, 2023  0159 Consult to Dr. Arlean Hopping, hospitalist, who is agreeable to admitting this patient to his service and coordinating a palliative care consult, per family request. I appreciate his collaboration in the care of this patient.  [RS]    Clinical Course User Index [RS] Savana Spina, Eugene Gavia, PA-C   Medical Decision Making Amount and/or Complexity of Data Reviewed Labs: ordered. Radiology: ordered.  Risk Prescription drug management. Decision regarding hospitalization.    Care of this patient assumed from preceding ED provider Dr. Miguel Dibble; please see his associated note for further insight into the patient's ED course. In brief, patient is a 77 year old male with history of diastolic heart failure who presents with shortness of breath.  New oxygen requirement 4 L supplemental oxygen by nasal cannula, no oxygen at baseline.  Has been short of breath x 2 days.  Pending PE study and then will require admission for likely diastolic heart failure exacerbation.  Did have recent admission for similar symptoms despite normal BNP at that time as well, responsive to diuresis inpatient.  No PE on CT, remains on 4L supplemental O2 to maintain sats >90%. Will require admission to the hospital.  Admit as above. [name]'s family voiced understanding of his medical evaluation and treatment plan. Each of their questions answered to their expressed satisfaction. They are amenable to plan for admission at this time.  This chart was dictated using voice recognition software, Dragon. Despite the best efforts of this provider to proofread and correct errors, errors may still occur which can change documentation meaning.        Paris Lore, PA-C 10/13/23 0202    Gilda Crease, MD 10/13/23 [phone]

## 2023-10-12 NOTE — ED Triage Notes (Signed)
Pt to ED via [facility] c/o SHOB worse today. HX CHF, Increase edema noted to lower extremities. EMS noted increased work of breathing. Pt denies Chest pain   #20 l hand-No meds given by EMS.  Last VS: 150 SYSTOLIC, HR 78, 16%1W. CBG 98.

## 2023-10-12 NOTE — ED Notes (Signed)
IV team to bedside. 

## 2023-10-13 ENCOUNTER — Encounter (HOSPITAL_COMMUNITY): Payer: Self-pay | Admitting: Internal Medicine

## 2023-10-13 DIAGNOSIS — Z85828 Personal history of other malignant neoplasm of skin: Secondary | ICD-10-CM | POA: Diagnosis not present

## 2023-10-13 DIAGNOSIS — E781 Pure hyperglyceridemia: Secondary | ICD-10-CM | POA: Diagnosis present

## 2023-10-13 DIAGNOSIS — Z96642 Presence of left artificial hip joint: Secondary | ICD-10-CM | POA: Diagnosis present

## 2023-10-13 DIAGNOSIS — Z885 Allergy status to narcotic agent status: Secondary | ICD-10-CM | POA: Diagnosis not present

## 2023-10-13 DIAGNOSIS — F32A Depression, unspecified: Secondary | ICD-10-CM | POA: Diagnosis present

## 2023-10-13 DIAGNOSIS — Z85820 Personal history of malignant melanoma of skin: Secondary | ICD-10-CM | POA: Diagnosis not present

## 2023-10-13 DIAGNOSIS — E669 Obesity, unspecified: Secondary | ICD-10-CM | POA: Diagnosis present

## 2023-10-13 DIAGNOSIS — E876 Hypokalemia: Secondary | ICD-10-CM | POA: Diagnosis present

## 2023-10-13 DIAGNOSIS — G8929 Other chronic pain: Secondary | ICD-10-CM | POA: Diagnosis present

## 2023-10-13 DIAGNOSIS — I7121 Aneurysm of the ascending aorta, without rupture: Secondary | ICD-10-CM | POA: Diagnosis present

## 2023-10-13 DIAGNOSIS — Z880 Allergy status to penicillin: Secondary | ICD-10-CM | POA: Diagnosis not present

## 2023-10-13 DIAGNOSIS — E782 Mixed hyperlipidemia: Secondary | ICD-10-CM | POA: Diagnosis not present

## 2023-10-13 DIAGNOSIS — J9601 Acute respiratory failure with hypoxia: Secondary | ICD-10-CM

## 2023-10-13 DIAGNOSIS — I5033 Acute on chronic diastolic (congestive) heart failure: Secondary | ICD-10-CM | POA: Diagnosis present

## 2023-10-13 DIAGNOSIS — I2510 Atherosclerotic heart disease of native coronary artery without angina pectoris: Secondary | ICD-10-CM | POA: Diagnosis present

## 2023-10-13 DIAGNOSIS — Z6833 Body mass index (BMI) 33.0-33.9, adult: Secondary | ICD-10-CM | POA: Diagnosis not present

## 2023-10-13 DIAGNOSIS — Z7984 Long term (current) use of oral hypoglycemic drugs: Secondary | ICD-10-CM | POA: Diagnosis not present

## 2023-10-13 DIAGNOSIS — M25551 Pain in right hip: Secondary | ICD-10-CM

## 2023-10-13 DIAGNOSIS — I110 Hypertensive heart disease with heart failure: Secondary | ICD-10-CM | POA: Diagnosis present

## 2023-10-13 DIAGNOSIS — Z886 Allergy status to analgesic agent status: Secondary | ICD-10-CM | POA: Diagnosis not present

## 2023-10-13 DIAGNOSIS — Z79899 Other long term (current) drug therapy: Secondary | ICD-10-CM | POA: Diagnosis not present

## 2023-10-13 DIAGNOSIS — I10 Essential (primary) hypertension: Secondary | ICD-10-CM

## 2023-10-13 DIAGNOSIS — I712 Thoracic aortic aneurysm, without rupture, unspecified: Secondary | ICD-10-CM | POA: Diagnosis present

## 2023-10-13 DIAGNOSIS — R627 Adult failure to thrive: Secondary | ICD-10-CM | POA: Diagnosis present

## 2023-10-13 DIAGNOSIS — Z8546 Personal history of malignant neoplasm of prostate: Secondary | ICD-10-CM | POA: Diagnosis not present

## 2023-10-13 DIAGNOSIS — Z66 Do not resuscitate: Secondary | ICD-10-CM | POA: Diagnosis present

## 2023-10-13 DIAGNOSIS — Z888 Allergy status to other drugs, medicaments and biological substances status: Secondary | ICD-10-CM | POA: Diagnosis not present

## 2023-10-13 LAB — COMPREHENSIVE METABOLIC PANEL
ALT: 18 U/L (ref 0–44)
AST: 20 U/L (ref 15–41)
Albumin: 3 g/dL — ABNORMAL LOW (ref 3.5–5.0)
Alkaline Phosphatase: 55 U/L (ref 38–126)
Anion gap: 8 (ref 5–15)
BUN: 12 mg/dL (ref 8–23)
CO2: 25 mmol/L (ref 22–32)
Calcium: 8.8 mg/dL — ABNORMAL LOW (ref 8.9–10.3)
Chloride: 106 mmol/L (ref 98–111)
Creatinine, Ser: 0.72 mg/dL (ref 0.61–1.24)
GFR, Estimated: >60 mL/min (ref >60)
Glucose, Bld: 103 mg/dL — ABNORMAL HIGH (ref 70–99)
Potassium: 3.5 mmol/L (ref 3.5–5.1)
Sodium: 139 mmol/L (ref 135–145)
Total Bilirubin: 0.4 mg/dL (ref <1.2)
Total Protein: 5.8 g/dL — ABNORMAL LOW (ref 6.5–8.1)

## 2023-10-13 LAB — TROPONIN I (HIGH SENSITIVITY)
Troponin I (High Sensitivity): 5 ng/L (ref <18)
Troponin I (High Sensitivity): 6 ng/L (ref <18)

## 2023-10-13 LAB — CBC WITH DIFFERENTIAL/PLATELET
Abs Immature Granulocytes: 0.13 10*3/uL — ABNORMAL HIGH (ref 0.00–0.07)
Basophils Absolute: 0 10*3/uL (ref 0.0–0.1)
Basophils Relative: 0 %
Eosinophils Absolute: 0.2 10*3/uL (ref 0.0–0.5)
Eosinophils Relative: 2 %
HCT: 41 % (ref 39.0–52.0)
Hemoglobin: 13.3 g/dL (ref 13.0–17.0)
Immature Granulocytes: 1 %
Lymphocytes Relative: 9 %
Lymphs Abs: 1 10*3/uL (ref 0.7–4.0)
MCH: 28.1 pg (ref 26.0–34.0)
MCHC: 32.4 g/dL (ref 30.0–36.0)
MCV: 86.5 fL (ref 80.0–100.0)
Monocytes Absolute: 0.8 10*3/uL (ref 0.1–1.0)
Monocytes Relative: 7 %
Neutro Abs: 9.4 10*3/uL — ABNORMAL HIGH (ref 1.7–7.7)
Neutrophils Relative %: 81 %
Platelets: 365 10*3/uL (ref 150–400)
RBC: 4.74 MIL/uL (ref 4.22–5.81)
RDW: 15.9 % — ABNORMAL HIGH (ref 11.5–15.5)
WBC: 11.5 10*3/uL — ABNORMAL HIGH (ref 4.0–10.5)
nRBC: 0 % (ref 0.0–0.2)

## 2023-10-13 LAB — PHOSPHORUS: Phosphorus: 3.2 mg/dL (ref 2.5–4.6)

## 2023-10-13 LAB — MAGNESIUM: Magnesium: 2 mg/dL (ref 1.7–2.4)

## 2023-10-13 MED ORDER — MORPHINE SULFATE (PF) 4 MG/ML IV SOLN
4.0000 mg | INTRAVENOUS | Status: DC | PRN
  Administered 2023-10-13 (×3): 4 mg via INTRAVENOUS
  Filled 2023-10-13 (×3): qty 1

## 2023-10-13 MED ORDER — ACETAMINOPHEN 325 MG PO TABS
650.0000 mg | ORAL_TABLET | Freq: Four times a day (QID) | ORAL | Status: DC | PRN
  Administered 2023-10-13 – 2023-10-14 (×2): 650 mg via ORAL
  Filled 2023-10-13 (×2): qty 2

## 2023-10-13 MED ORDER — FENOFIBRATE 54 MG PO TABS
54.0000 mg | ORAL_TABLET | Freq: Every day | ORAL | Status: DC
  Administered 2023-10-14 – 2023-10-15 (×2): 54 mg via ORAL
  Filled 2023-10-13 (×3): qty 1

## 2023-10-13 MED ORDER — ACETAMINOPHEN 650 MG RE SUPP: 650.0000 mg | Freq: Four times a day (QID) | RECTAL | Status: DC | PRN

## 2023-10-13 MED ORDER — FENTANYL CITRATE PF 50 MCG/ML IJ SOSY
25.0000 ug | PREFILLED_SYRINGE | INTRAMUSCULAR | Status: DC | PRN
  Administered 2023-10-13: 25 ug via INTRAVENOUS
  Filled 2023-10-13 (×2): qty 1

## 2023-10-13 MED ORDER — GABAPENTIN 100 MG PO CAPS
100.0000 mg | ORAL_CAPSULE | Freq: Once | ORAL | Status: AC
  Administered 2023-10-13: 100 mg via ORAL
  Filled 2023-10-13: qty 1

## 2023-10-13 MED ORDER — DONEPEZIL HCL 5 MG PO TABS
5.0000 mg | ORAL_TABLET | Freq: Every day | ORAL | Status: DC
  Administered 2023-10-13 – 2023-10-14 (×2): 5 mg via ORAL
  Filled 2023-10-13 (×2): qty 1

## 2023-10-13 MED ORDER — NALOXONE HCL 0.4 MG/ML IJ SOLN: 0.4000 mg | INTRAMUSCULAR | Status: DC | PRN

## 2023-10-13 MED ORDER — GABAPENTIN 100 MG PO CAPS
100.0000 mg | ORAL_CAPSULE | Freq: Three times a day (TID) | ORAL | Status: DC
  Administered 2023-10-13 – 2023-10-15 (×6): 100 mg via ORAL
  Filled 2023-10-13 (×6): qty 1

## 2023-10-13 MED ORDER — SERTRALINE HCL 50 MG PO TABS
50.0000 mg | ORAL_TABLET | Freq: Every day | ORAL | Status: DC
  Administered 2023-10-13 – 2023-10-15 (×3): 50 mg via ORAL
  Filled 2023-10-13 (×3): qty 1

## 2023-10-13 MED ORDER — ATORVASTATIN CALCIUM 40 MG PO TABS
40.0000 mg | ORAL_TABLET | Freq: Every day | ORAL | Status: DC
  Administered 2023-10-13 – 2023-10-14 (×2): 40 mg via ORAL
  Filled 2023-10-13 (×2): qty 1

## 2023-10-13 MED ORDER — FUROSEMIDE 10 MG/ML IJ SOLN
40.0000 mg | Freq: Two times a day (BID) | INTRAMUSCULAR | Status: DC
  Administered 2023-10-13 – 2023-10-15 (×5): 40 mg via INTRAVENOUS
  Filled 2023-10-13 (×5): qty 4

## 2023-10-13 MED ORDER — ONDANSETRON HCL 4 MG/2ML IJ SOLN
4.0000 mg | Freq: Four times a day (QID) | INTRAMUSCULAR | Status: DC | PRN
  Administered 2023-10-13: 4 mg via INTRAVENOUS
  Filled 2023-10-13: qty 2

## 2023-10-13 MED ORDER — MELATONIN 3 MG PO TABS
3.0000 mg | ORAL_TABLET | Freq: Every evening | ORAL | Status: DC | PRN
  Administered 2023-10-13 – 2023-10-14 (×2): 3 mg via ORAL
  Filled 2023-10-13 (×2): qty 1

## 2023-10-13 MED ORDER — POTASSIUM CHLORIDE CRYS ER 20 MEQ PO TBCR
20.0000 meq | EXTENDED_RELEASE_TABLET | Freq: Every day | ORAL | Status: DC
Start: 2023-10-13 — End: 2023-10-15
  Administered 2023-10-13 – 2023-10-15 (×3): 20 meq via ORAL
  Filled 2023-10-13 (×3): qty 1

## 2023-10-13 NOTE — Telephone Encounter (Signed)
Noted  

## 2023-10-13 NOTE — ED Notes (Signed)
Transport called for PT

## 2023-10-13 NOTE — H&P (Signed)
History and Physical      Larry Elliott CBJ:[id number] DOB: [birth year] DOA: 10/12/2023; DOS: 10/13/2023  PCP: Wanda Plump, MD  Patient coming from: home   I have personally briefly reviewed patient's old medical records in [facility]  Chief Complaint: sob  HPI: Larry Elliott is a 77 y.o. male with medical history significant for chronic diastolic heart failure, essential pretension, hyperlipidemia, chronic right hip pain, who is admitted to [facility] on 10/12/2023 with acute on chronic diastolic heart failure after presenting from home to [facility] ED complaining of shortness of breath.   The following history is provided by the patient as well as his daughter, Larry Elliott, in addition to my discussions with the EDP and via chart review.  Seen the patient has been experiencing 3 to 4 days of progressive shortness of breath subsequent orthopnea and worsening of edema in the bilateral lower extremities.  Denies any associated chest pain, palpitations, diaphoresis, nausea, vomiting, dizziness, presyncope, or syncope.  No recent subjective fever, chills, rigors, or generalized myalgias.  No wheezing.  Daughter conveys that the patient had a very similar presentation in October 2024, which improved with IV Lasix.   Medical history notable for chronic diastolic heart failure, with limited echocardiogram in October 2024 notable for LVEF 6065%, normal right ventricular systolic function.  Outpatient diuretic regimen consist of spironolactone in the absence of any loop diuretic.  No known baseline supplemental oxygen requirement.    ED Course:  Vital signs in the ED were notable for the following: Afebrile; heart rates in the 60s to 90s; systolic blood pressures in the 120s to 160s; respiratory rate 16-25, initial oxygen saturation 86% on room air, subsequently improving into the range of 96 to 100% on 4 L nasal cannula.  Labs were notable for the following: CMP notable for the  following: Sodium 139, potassium 4.0, bicarbonate 23, creatinine 0.75, liver enzymes were within normal limits.  BNP 40, relative to most recent prior BNP data point of 14.6 on 08/17/2023.  High sensitive troponin I initially was 5 with repeat value also 5, and third troponin noted to be 6.  CBC notable for open cell count 10,300.  Per my interpretation, EKG in ED demonstrated the following: Sinus rhythm with heart rate 79, normal intervals, no evidence of T wave or ST changes, including no evidence of ST elevation.  Imaging in the ED, per corresponding formal radiology read, was notable for the following: 1 view chest x-ray showed cardiomegaly with interval increase in pulmonary vascular congestion.  CTA chest with PE protocol showed no evidence of acute pulmonary embolism or any evidence of infiltrate or pneumothorax, while showing mild aneurysmal dilation of the ascending aorta measuring up to 4 cm in the absence of any evidence of associated dissection or rupture, with associated radiology recommendations for follow-up CT or MR every 12 months.  While in the ED, the following were administered: Lasix 40 mg IV x 1 dose, gabapentin 100 mg p.o. x 1 dose.  Subsequently, the patient was admitted for further evaluation management of presenting acute on chronic diastolic heart failure complicated by acute hypoxic respiratory failure.    Review of Systems: As per HPI otherwise 10 point review of systems negative.   Past Medical History:  Diagnosis Date   Allergy    penicillin & Aspirin   Anxiety 11/04/2013   Arthritis    BCC (basal cell carcinoma), leg    Right calf, L nape of neck at hairline  CAD (coronary artery disease)    CP, cath, non-obstructive   Cataract    been removed   Depression 11/04/2013   Diverticulosis    colon   Erectile dysfunction following radical prostatectomy    Hyperlipidemia    Hypertension    Insomnia    Melanoma (HCC) 2010   face, sees derm    Prostate CA  (HCC) 07/2007   s/p robotic surgery   Spermatocele    Left    Past Surgical History:  Procedure Laterality Date   APPENDECTOMY     age 64   EYE SURGERY     cataracts removed   HERNIA REPAIR  2010   JOINT REPLACEMENT  hip 2024   KNEE ARTHROSCOPY Left 1965   meniscal tear   PROSTATECTOMY  07/25/2007    robotic,  for prostate cancer     SHOULDER ARTHROSCOPY Left 07/09/2013   Dr Ave Filter    SPINE SURGERY     TOTAL HIP ARTHROPLASTY Left 12/27/2022   Procedure: LEFT TOTAL HIP ARTHROPLASTY ANTERIOR APPROACH;  Surgeon: Marcene Corning, MD;  Location: [facility] ORS;  Service: Orthopedics;  Laterality: Left;   TRANSFORAMINAL LUMBAR INTERBODY FUSION (TLIF) WITH PEDICLE SCREW FIXATION 1 LEVEL Right 12/04/2019   Procedure: RIGHT-SIDED TRANSFORAMINAL LUMBAR INTERBODY FUSION LUMBAR FOUR THROUGH FIVE WITH INSTRUMENTATION AND ALLOGRAFT;  Surgeon: Estill Bamberg, MD;  Location: [facility] OR;  Service: Orthopedics;  Laterality: Right;    Social History:  reports that he has never smoked. He has never used smokeless tobacco. He reports that he does not currently use alcohol after a past usage of about 1.0 standard drink of alcohol per week. He reports that he does not use drugs.   Allergies  Allergen Reactions   Ambien [Zolpidem] Other (See Comments)    Encephalopathy, admitted 04-2022, symptoms suspected to be from [place]   Aspirin Swelling    swelling in face   Hydrocodone Other (See Comments)    Insomnia, mild SOB (w/o cough-wheezing)   Penicillins Swelling    swelling in face    Family History  Problem Relation Age of Onset   Coronary artery disease Father 90       dx age 30s   Stroke Mother    Hypertension Neg Hx    Diabetes Neg Hx    Colon cancer Neg Hx    Prostate cancer Neg Hx     Family history reviewed and not pertinent    Prior to Admission medications   Medication Sig Start Date End Date Taking? Authorizing Provider  atorvastatin (LIPITOR) 40 MG tablet Take 1 tablet (40 mg total)  by mouth at bedtime. 09/25/23  Yes Paz, Nolon Rod, MD  donepezil (ARICEPT) 5 MG tablet Take 1 tablet (5 mg total) by mouth at bedtime. 09/25/23  Yes Paz, Nolon Rod, MD  empagliflozin (JARDIANCE) 10 MG TABS tablet Take 1 tablet (10 mg total) by mouth daily. 09/25/23  Yes Paz, Nolon Rod, MD  fenofibrate micronized (LOFIBRA) 134 MG capsule Take 1 capsule (134 mg total) by mouth daily before breakfast. 09/25/23  Yes Wanda Plump, MD  furosemide (LASIX) 40 MG tablet 1 tablet every morning., Take an additional tablet at 1 PM on Monday Wednesday and Fridays 09/25/23  Yes Paz, Nolon Rod, MD  gabapentin (NEURONTIN) 100 MG capsule Take 1 capsule (100 mg total) by mouth 3 (three) times daily. 10/02/23  Yes Paz, Nolon Rod, MD  melatonin 5 MG TABS Take 1 tablet (5 mg total) by mouth at bedtime. 01/02/23  Yes Allena Katz,  Zachery Conch, MD  ondansetron (ZOFRAN) 4 MG tablet TAKE 1 TABLET BY MOUTH EVERY 8 HOURS AS NEEDED FOR NAUSEA OR VOMITING 03/09/23  Yes Paz, Elita Quick E, MD  potassium chloride SA (KLOR-CON M) 20 MEQ tablet Take 1 tablet (20 mEq total) by mouth daily. 10/02/23  Yes Paz, Nolon Rod, MD  sertraline (ZOLOFT) 50 MG tablet Take 1 tablet (50 mg total) by mouth daily. 09/25/23  Yes Paz, Nolon Rod, MD  spironolactone (ALDACTONE) 25 MG tablet Take 1 tablet (25 mg total) by mouth daily. 09/25/23  Yes Wanda Plump, MD     Objective    Physical Exam: Vitals:   10/12/23 2302 10/12/23 2342 10/13/23 0030 10/13/23 0130  BP:  118/78 137/78 134/85  Pulse:  77 82 92  Resp:  (!) 21 16 20   Temp: (!) 97.3 F (36.3 C)     TempSrc: Oral     SpO2:  100% 97% 98%  Weight:      Height:        General: appears to be stated age; alert, oriented Skin: warm, dry, no rash Head:  AT/[place] Mouth:  Oral mucosa membranes appear moist, normal dentition Neck: supple; trachea midline Heart:  RRR; did not appreciate any M/R/G Lungs: CTAB, did not appreciate any wheezes, rales, or rhonchi Abdomen: + BS; soft, ND, NT Vascular: 2+ pedal pulses b/l; 2+ radial pulses  b/l Extremities: no peripheral edema, no muscle wasting Neuro: strength and sensation intact in upper and lower extremities b/l    Labs on Admission: I have personally reviewed following labs and imaging studies  CBC: Recent Labs  Lab 10/12/23 1935  WBC 10.3  NEUTROABS 7.5  HGB 12.5*  HCT 39.9  MCV 89.1  PLT 362   Basic Metabolic Panel: Recent Labs  Lab 10/12/23 1935  NA 139  K 4.0  CL 107  CO2 23  GLUCOSE 98  BUN 16  CREATININE 0.75  CALCIUM 8.9   GFR: Estimated Creatinine Clearance: 90.5 mL/min (by C-G formula based on SCr of 0.75 mg/dL). Liver Function Tests: Recent Labs  Lab 10/12/23 1935  AST 19  ALT 18  ALKPHOS 57  BILITOT 0.5  PROT 5.5*  ALBUMIN 2.7*   No results for input(s): "LIPASE", "AMYLASE" in the last 168 hours. No results for input(s): "AMMONIA" in the last 168 hours. Coagulation Profile: No results for input(s): "INR", "PROTIME" in the last 168 hours. Cardiac Enzymes: No results for input(s): "CKTOTAL", "CKMB", "CKMBINDEX", "TROPONINI" in the last 168 hours. BNP (last 3 results) Recent Labs    01/09/23 1436 07/25/23 1337  PROBNP 14.0 12.0   HbA1C: No results for input(s): "HGBA1C" in the last 72 hours. CBG: No results for input(s): "GLUCAP" in the last 168 hours. Lipid Profile: No results for input(s): "CHOL", "HDL", "LDLCALC", "TRIG", "CHOLHDL", "LDLDIRECT" in the last 72 hours. Thyroid Function Tests: No results for input(s): "TSH", "T4TOTAL", "FREET4", "T3FREE", "THYROIDAB" in the last 72 hours. Anemia Panel: No results for input(s): "VITAMINB12", "FOLATE", "FERRITIN", "TIBC", "IRON", "RETICCTPCT" in the last 72 hours. Urine analysis:    Component Value Date/Time   COLORURINE YELLOW 12/28/2022 2159   APPEARANCEUR CLOUDY (A) 12/28/2022 2159   LABSPEC 1.021 12/28/2022 2159   PHURINE 5.0 12/28/2022 2159   GLUCOSEU NEGATIVE 12/28/2022 2159   GLUCOSEU NEGATIVE 10/08/2014 1055   HGBUR NEGATIVE 12/28/2022 2159   BILIRUBINUR  negative 02/17/2023 1449   KETONESUR NEGATIVE 12/28/2022 2159   PROTEINUR Positive (A) 02/17/2023 1449   PROTEINUR NEGATIVE 12/28/2022 2159   UROBILINOGEN  0.2 02/17/2023 1449   UROBILINOGEN 0.2 10/08/2014 1055   NITRITE negative 02/17/2023 1449   NITRITE NEGATIVE 12/28/2022 2159   LEUKOCYTESUR Negative 02/17/2023 1449   LEUKOCYTESUR NEGATIVE 12/28/2022 2159    Radiological Exams on Admission: CT Angio Chest PE W and/or Wo Contrast Result Date: 10/13/2023 CLINICAL DATA:  High probability for PE.  Shortness of breath. EXAM: CT ANGIOGRAPHY CHEST WITH CONTRAST TECHNIQUE: Multidetector CT imaging of the chest was performed using the standard protocol during bolus administration of intravenous contrast. Multiplanar CT image reconstructions and MIPs were obtained to evaluate the vascular anatomy. RADIATION DOSE REDUCTION: This exam was performed according to the departmental dose-optimization program which includes automated exposure control, adjustment of the mA and/or kV according to patient size and/or use of iterative reconstruction technique. CONTRAST:  75mL OMNIPAQUE IOHEXOL 350 MG/ML SOLN COMPARISON:  None Available. FINDINGS: Cardiovascular: There is mild aneurysmal dilatation of the ascending aorta measuring up to 4 cm. The main pulmonary artery is enlarged compatible with pulmonary artery hypertension. Heart is borderline enlarged. There is no pericardial effusion. There is no pulmonary embolism identified. Mediastinum/Nodes: No enlarged mediastinal, hilar, or axillary lymph nodes. Thyroid gland, trachea, and esophagus demonstrate no significant findings. Lungs/Pleura: There is minimal atelectasis in the lung bases. The lungs are otherwise clear. There is no pleural effusion or pneumothorax. Upper Abdomen: No acute abnormality. Musculoskeletal: No chest wall abnormality. No acute or significant osseous findings. Review of the MIP images confirms the above findings. IMPRESSION: 1. No evidence for  pulmonary embolism. 2. Enlarged main pulmonary artery compatible with pulmonary artery hypertension. 3. Mild aneurysmal dilatation of the ascending aorta measuring up to 4 cm. Recommend follow-up CT/MR every 12 months and vascular consultation. 4. Borderline cardiomegaly. 5. Minimal atelectasis in the lung bases. Lungs are otherwise clear. Electronically Signed   By: Darliss Cheney M.D.   On: 10/13/2023 00:39   DG Chest Port 1 View Result Date: 10/12/2023 CLINICAL DATA:  Shortness of breath EXAM: PORTABLE CHEST 1 VIEW COMPARISON:  08/17/2023, 12/28/2022 FINDINGS: Cardiomegaly with vascular congestion and possible small right effusion. Atelectasis or scarring at the right base. IMPRESSION: Cardiomegaly with vascular congestion and possible small right effusion. Electronically Signed   By: Jasmine Pang M.D.   On: 10/12/2023 22:05      Assessment/Plan   Principal Problem:   Acute on chronic diastolic heart failure (HCC) Active Problems:   Hyperlipidemia   Essential hypertension   Depression   Acute hypoxic respiratory failure (HCC)   Thoracic aortic aneurysm (TAA) (HCC)   Chronic right hip pain    #) Acute on chronic diastolic heart failure: dx of acute decompensation on the basis of presenting 3 to 4 days of progressive shortness of breath associate with orthopnea, worsening of peripheral edema, greater than 3 fold interval increase in BNP, as quantified above, with presenting chest x-ray showing interval development of increased pulmonary vascular congestion. This is in the context of a known history of chronic diastolic heart failure, with most recent echocardiogram performed October 2024, which was a limited echo showing LVEF 60 to 65%, normal right ventricular systolic function. Etiology leading to presenting acutely decompensated heart failure is currently unclear. Patient conveys good compliance with home diuretic therapy, which consists of spironolactone.   Overall, ACS leading to  presenting acutely decompensated heart failure appears less likely at this time in the absence of any recent CP, presenting EKG showing no evidence of acute ischemic changes, as well as non-elevated troponin.   Of note, patient received Lasix  40 mg IV x 1 dose while in the ED today. Presentation warrants additional IV diuresis, as further detailed below, with close monitoring of ensuing renal function, electrolytes, and volume status, as further noted below.  Appears to have had a similar presentation in October 2024, which improved with IV diuresis efforts.  Plan: monitor strict I's & O's and daily weights. Monitor on telemetry, including trend in HR in response to diuresis. Monitor continuous pulse oximetry. CMP in the morning, including for monitoring trend of potassium, bicarbonate, and renal function in response to interval diuresis efforts.  Hold home spironolactone.  Continue outpatient potassium chloride 20 mEq p.o. daily.  Lasix 40 mg IV twice daily.  Check serum magnesium level. Close monitoring of ensuing blood pressure response to diuresis efforts, including to help guide need for improvement in afterload reduction in order to optimize cardiac output.                       #) Acute hypoxic respiratory failure: in the context of acute respiratory symptoms and no known baseline supplemental O2 requirements, presenting O2 sat note to be 86% on room air, [place] improving into the high 90s on 4 L nasal cannula. Appears to be on basis of acute on chronic diastolic heart failure, as further detailed above, with CTA chest showing no evidence of acute pulmonary embolism or any evidence of infiltrate to suggest pneumonia nor any evidence of pneumothorax. No known chronic underlying pulmonary conditions .  As described above, ACS appears less likely.   Plan: further evaluation/management of presenting acute on chronic diastolic heart failure, as above. monitor on telemetry. CMP/CBC in the  AM. Check serum Mg and Phos levels.                      #) ascending thoracic aorta aneurysm: Today CTA chest shows mild aneurysmal dilation of the ascending aorta, measuring up to 4 cm, in the absence of any evidence of associated dissection or rupture, with associated radiology recommendation for follow-up CT or MR every 12 months.  Plan: Will convey radiology recommendation for acute 43-month follow-up via either CT or MR, as above.                    #) Essential Hypertension: documented h/o such, with outpatient antihypertensive regimen including spironolactone.  SBP's in the ED today: 120s to 160s mmHg. there may be room for afterload reduction optimization, particular in the context of presenting acute on chronic diastolic heart failure.  Plan: Close monitoring of subsequent BP via routine VS. hold home spironolactone for now and lieu of plan for additional IV Lasix, as further detailed above.  Monitor strict I's and O's and daily weights.                   #) Hyperlipidemia: documented h/o such. On fenofibrate as outpatient.   Plan: continue home fenofibrate.                   #) Depression: documented h/o such. On Zoloft as outpatient.  Of note, presenting EKG shows no evidence of QTc prolongation.  Plan: Continue outpatient Zoloft.                  #) Chronic hip pain: Documented history of such, with outpatient pain regimen includes gabapentin.  Of note, patient received a dose of IV fentanyl this evening, but notes no significant improvement in his hip discomfort  with this measure.  In the setting of his presenting acute on chronic diastolic heart failure, we will refrain from NSAIDs for now.  Plan: Prn IV morphine.  Continue outpatient gabapentin.  May also consider Lidoderm patch given the focal nature of his hip discomfort.       DVT prophylaxis: SCD's   Code Status: DNR, but amenable to  intubation in the setting of a pure respiratory indication for such.  Family Communication:  I discussed patient's case with his daughter/POA, Traci, who is present at bedside Disposition Plan: Per Rounding Team Consults called: none;  Admission status: Inpatient     I SPENT GREATER THAN 75  MINUTES IN CLINICAL CARE TIME/MEDICAL DECISION-MAKING IN COMPLETING THIS ADMISSION.      Chaney Born Beyounce Dickens DO [facility]  From 7PM - 7AM   10/13/2023, 2:09 AM

## 2023-10-13 NOTE — Progress Notes (Signed)
Patient seen and examined, admitted by Dr. Arlean Hopping this morning   Briefly 77 year old male with chronic diastolic CHF, HTN, HLP, chronic right hip pain presented with shortness of breath.  Patient reported 3 to 4 days of progressive shortness of breath, orthopnea, worsening of swelling in his lower extremities.  Per daughter, patient had similar presentation in 07/2023 which improved with IV Lasix.  Patient was admitted for acute on chronic diastolic CHF exacerbation  BP 161/09   Pulse 91   Temp 98.3 F (36.8 C) (Oral)   Resp 20   Ht 5\' 10"  (1.778 m)   Wt 104.3 kg   SpO2 91%   BMI 33.00 kg/m   Physical Exam General: Alert and oriented x 3, NAD, hearing deficit  Cardiovascular: S1 S2 clear, RRR.  Respiratory: Diminished breath the bases, no wheezing Gastrointestinal: Soft, nontender, nondistended, NBS Ext: 2+ pedal edema bilaterally Neuro: no new deficits Psych: Normal affect  Labs, imaging reviewed  Acute respiratory failure with hypoxia secondary to acute on chronic diastolic CHF exacerbation -Presented with 3 to 4 days of progressive shortness of breath, orthopnea, peripheral edema.  Chest x-ray with increased pulmonary vascular congestion. -2D echo 07/2023 had shown EF of 60 to 65% -Will continue Lasix 40 mg IV every 12 hours, strict I's and O's and daily weights -Cardiology consulted, patient follows cardiology, Dr. Bjorn Pippin -O2 sats 91 to 96% on 6 L O2, wean as tolerated -CTA chest negative for PE.  Enlarged main pulmonary artery compatible with pulmonary artery hypertension.  Generalized debility, FTT -Per daughter, patient lives at home with wife and grandson however has been becoming more debilitated.  Ambulates with a cane, has walker at home.  Patient has a home health PT and aide however needing more supervision and needs medication management.  Requesting possible placement, ALF -PT OT evaluation  Discussed above with patient's daughter at the bedside Other  medical issues per admission H&P   Wilena Tyndall M.D.  [facility] 10/13/2023, 9:20 AM

## 2023-10-14 DIAGNOSIS — I5033 Acute on chronic diastolic (congestive) heart failure: Secondary | ICD-10-CM | POA: Diagnosis not present

## 2023-10-14 LAB — BASIC METABOLIC PANEL
Anion gap: 7 (ref 5–15)
BUN: 13 mg/dL (ref 8–23)
CO2: 26 mmol/L (ref 22–32)
Calcium: 8.5 mg/dL — ABNORMAL LOW (ref 8.9–10.3)
Chloride: 103 mmol/L (ref 98–111)
Creatinine, Ser: 0.86 mg/dL (ref 0.61–1.24)
GFR, Estimated: >60 mL/min (ref >60)
Glucose, Bld: 103 mg/dL — ABNORMAL HIGH (ref 70–99)
Potassium: 3.2 mmol/L — ABNORMAL LOW (ref 3.5–5.1)
Sodium: 136 mmol/L (ref 135–145)

## 2023-10-14 LAB — CBC
HCT: 41.1 % (ref 39.0–52.0)
Hemoglobin: 13.1 g/dL (ref 13.0–17.0)
MCH: 27.8 pg (ref 26.0–34.0)
MCHC: 31.9 g/dL (ref 30.0–36.0)
MCV: 87.3 fL (ref 80.0–100.0)
Platelets: 367 10*3/uL (ref 150–400)
RBC: 4.71 MIL/uL (ref 4.22–5.81)
RDW: 16 % — ABNORMAL HIGH (ref 11.5–15.5)
WBC: 9.4 10*3/uL (ref 4.0–10.5)
nRBC: 0 % (ref 0.0–0.2)

## 2023-10-14 MED ORDER — POTASSIUM CHLORIDE 20 MEQ PO PACK
40.0000 meq | PACK | Freq: Once | ORAL | Status: AC
  Administered 2023-10-14: 40 meq via ORAL
  Filled 2023-10-14: qty 2

## 2023-10-14 MED ORDER — ENOXAPARIN SODIUM 40 MG/0.4ML IJ SOSY
40.0000 mg | PREFILLED_SYRINGE | INTRAMUSCULAR | Status: DC
  Administered 2023-10-14: 40 mg via SUBCUTANEOUS
  Filled 2023-10-14: qty 0.4

## 2023-10-14 NOTE — Progress Notes (Signed)
Mobility Specialist Progress Note:    10/14/23 1227  Mobility  Activity Ambulated with assistance in room  Level of Assistance Minimal assist, patient does 75% or more (+2)  Assistive Device Other (Comment) (HHA)  Distance Ambulated (ft) 5 ft  Activity Response Tolerated well  Mobility Referral Yes  Mobility visit 1 Mobility  Mobility Specialist Start Time (ACUTE ONLY) 1110  Mobility Specialist Stop Time (ACUTE ONLY) 1125  Mobility Specialist Time Calculation (min) (ACUTE ONLY) 15 min   Pt received sitting EOB agreeable to mobility. Pt needed MinA +2 to stand and take a few side steps towards the [facility]. Pt requested to stay seated EOB. Call bell and personal belongings in reach. All needs met w/ call bell and personal belongings in reach. Family in room.  Thompson Grayer Mobility Specialist  Please contact vis Secure Chat or  Rehab Office [phone]

## 2023-10-14 NOTE — Plan of Care (Signed)

## 2023-10-14 NOTE — Progress Notes (Signed)
PROGRESS NOTE    Larry Elliott  UJW:[id number] DOB: [birth year] DOA: 10/12/2023 PCP: Wanda Plump, MD  Outpatient Specialists:     Brief Narrative:  Patient is a 77 year old Caucasian male with history of diastolic congestive heart failure, obesity, hypertension, hyperlipidemia, chronic right hip pain.  Patient was admitted with acute on chronic diastolic CHF.  10/14/2023: Patient seems to be improving.  Pertinent labs revealed potassium of 3.2, calcium of 8.5 and hemoglobin of 13.1 g/dL.   Assessment & Plan:   Principal Problem:   Acute on chronic diastolic heart failure (HCC) Active Problems:   Hyperlipidemia   Essential hypertension   Depression   Acute hypoxic respiratory failure (HCC)   Thoracic aortic aneurysm (TAA) (HCC)   Chronic right hip pain   Acute respiratory failure with hypoxia secondary to acute on chronic diastolic CHF exacerbation -Presented with 3 to 4 days of progressive shortness of breath, orthopnea, peripheral edema.  Chest x-ray with increased pulmonary vascular congestion. -2D echo 07/2023 had shown EF of 60 to 65% -Continue Lasix 40 mg IV every 12 hours. -Strict I's and O's and daily weights -Awaiting cardiology input. -Patient follows cardiology, Dr. Bjorn Pippin -Consider SGLT2 inhibitor, Aldactone etc. when patient is more stable.  Will also defer to the cardiology team. -O2 sats 91 to 96% on 6 L O2, wean as tolerated.  Consider outpatient workup for OSA i.e. if not already done. -CTA chest negative for PE.  Enlarged main pulmonary artery compatible with pulmonary artery hypertension.   Generalized debility, FTT -Per daughter, patient lives at home with wife and grandson however has been becoming more debilitated.  Ambulates with a cane, has walker at home.  Patient has a home health PT and aide however needing more supervision and needs medication management.  Requesting possible placement, ALF -PT OT evaluation   DVT prophylaxis: Start  subcutaneous Lovenox 40 Mg daily. Code Status: DNR Family Communication:  Disposition Plan: Possible SNF   Consultants:  Awaiting cardiology input  Procedures:  None  Antimicrobials:  None   Subjective: Shortness of breath is improving.  Objective: Vitals:   10/14/23 0405 10/14/23 0500 10/14/23 0813 10/14/23 1129  BP: 101/82  112/69 127/68  Pulse:    72  Resp: (!) 25  20 20   Temp: 98.6 F (37 C)  98.2 F (36.8 C) 98.2 F (36.8 C)  TempSrc: Oral  Oral Oral  SpO2: 93%  90% 97%  Weight:  109.7 kg    Height:        Intake/Output Summary (Last 24 hours) at 10/14/2023 1252 Last data filed at 10/14/2023 5621 Gross per 24 hour  Intake 120 ml  Output 1800 ml  Net -1680 ml   Filed Weights   10/12/23 1807 10/13/23 1702 10/14/23 0500  Weight: 104.3 kg 108.9 kg 109.7 kg    Examination:  General exam: Appears calm and comfortable.  Patient is obese. Respiratory system: Clear to auscultation.  Cardiovascular system: S1 & S2 heard Gastrointestinal system: Abdomen is obese, soft and nontender.   Central nervous system: Alert and oriented. No focal neurological deficits. Extremities: Fullness of the ankle.  Data Reviewed: I have personally reviewed following labs and imaging studies  CBC: Recent Labs  Lab 10/12/23 1935 10/13/23 0611 10/14/23 0229  WBC 10.3 11.5* 9.4  NEUTROABS 7.5 9.4*  --   HGB 12.5* 13.3 13.1  HCT 39.9 41.0 41.1  MCV 89.1 86.5 87.3  PLT 362 365 367   Basic Metabolic Panel: Recent Labs  Lab 10/12/23 1935  10/13/23 0611 10/14/23 0229  NA 139 139 136  K 4.0 3.5 3.2*  CL 107 106 103  CO2 23 25 26   GLUCOSE 98 103* 103*  BUN 16 12 13   CREATININE 0.75 0.72 0.86  CALCIUM 8.9 8.8* 8.5*  MG  --  2.0  --   PHOS  --  3.2  --    GFR: Estimated Creatinine Clearance: 86.4 mL/min (by C-G formula based on SCr of 0.86 mg/dL). Liver Function Tests: Recent Labs  Lab 10/12/23 1935 10/13/23 0611  AST 19 20  ALT 18 18  ALKPHOS 57 55  BILITOT  0.5 0.4  PROT 5.5* 5.8*  ALBUMIN 2.7* 3.0*   No results for input(s): "LIPASE", "AMYLASE" in the last 168 hours. No results for input(s): "AMMONIA" in the last 168 hours. Coagulation Profile: No results for input(s): "INR", "PROTIME" in the last 168 hours. Cardiac Enzymes: No results for input(s): "CKTOTAL", "CKMB", "CKMBINDEX", "TROPONINI" in the last 168 hours. BNP (last 3 results) Recent Labs    01/09/23 1436 07/25/23 1337  PROBNP 14.0 12.0   HbA1C: No results for input(s): "HGBA1C" in the last 72 hours. CBG: No results for input(s): "GLUCAP" in the last 168 hours. Lipid Profile: No results for input(s): "CHOL", "HDL", "LDLCALC", "TRIG", "CHOLHDL", "LDLDIRECT" in the last 72 hours. Thyroid Function Tests: No results for input(s): "TSH", "T4TOTAL", "FREET4", "T3FREE", "THYROIDAB" in the last 72 hours. Anemia Panel: No results for input(s): "VITAMINB12", "FOLATE", "FERRITIN", "TIBC", "IRON", "RETICCTPCT" in the last 72 hours. Urine analysis:    Component Value Date/Time   COLORURINE YELLOW 12/28/2022 2159   APPEARANCEUR CLOUDY (A) 12/28/2022 2159   LABSPEC 1.021 12/28/2022 2159   PHURINE 5.0 12/28/2022 2159   GLUCOSEU NEGATIVE 12/28/2022 2159   GLUCOSEU NEGATIVE 10/08/2014 1055   HGBUR NEGATIVE 12/28/2022 2159   BILIRUBINUR negative 02/17/2023 1449   KETONESUR NEGATIVE 12/28/2022 2159   PROTEINUR Positive (A) 02/17/2023 1449   PROTEINUR NEGATIVE 12/28/2022 2159   UROBILINOGEN 0.2 02/17/2023 1449   UROBILINOGEN 0.2 10/08/2014 1055   NITRITE negative 02/17/2023 1449   NITRITE NEGATIVE 12/28/2022 2159   LEUKOCYTESUR Negative 02/17/2023 1449   LEUKOCYTESUR NEGATIVE 12/28/2022 2159   Sepsis Labs: @LABRCNTIP (procalcitonin:4,lacticidven:4)  )No results found for this or any previous visit (from the past 240 hours).       Radiology Studies: CT Angio Chest PE W and/or Wo Contrast Result Date: 10/13/2023 CLINICAL DATA:  High probability for PE.  Shortness of breath.  EXAM: CT ANGIOGRAPHY CHEST WITH CONTRAST TECHNIQUE: Multidetector CT imaging of the chest was performed using the standard protocol during bolus administration of intravenous contrast. Multiplanar CT image reconstructions and MIPs were obtained to evaluate the vascular anatomy. RADIATION DOSE REDUCTION: This exam was performed according to the departmental dose-optimization program which includes automated exposure control, adjustment of the mA and/or kV according to patient size and/or use of iterative reconstruction technique. CONTRAST:  75mL OMNIPAQUE IOHEXOL 350 MG/ML SOLN COMPARISON:  None Available. FINDINGS: Cardiovascular: There is mild aneurysmal dilatation of the ascending aorta measuring up to 4 cm. The main pulmonary artery is enlarged compatible with pulmonary artery hypertension. Heart is borderline enlarged. There is no pericardial effusion. There is no pulmonary embolism identified. Mediastinum/Nodes: No enlarged mediastinal, hilar, or axillary lymph nodes. Thyroid gland, trachea, and esophagus demonstrate no significant findings. Lungs/Pleura: There is minimal atelectasis in the lung bases. The lungs are otherwise clear. There is no pleural effusion or pneumothorax. Upper Abdomen: No acute abnormality. Musculoskeletal: No chest wall abnormality. No acute or significant  osseous findings. Review of the MIP images confirms the above findings. IMPRESSION: 1. No evidence for pulmonary embolism. 2. Enlarged main pulmonary artery compatible with pulmonary artery hypertension. 3. Mild aneurysmal dilatation of the ascending aorta measuring up to 4 cm. Recommend follow-up CT/MR every 12 months and vascular consultation. 4. Borderline cardiomegaly. 5. Minimal atelectasis in the lung bases. Lungs are otherwise clear. Electronically Signed   By: Darliss Cheney M.D.   On: 10/13/2023 00:39   DG Chest Port 1 View Result Date: 10/12/2023 CLINICAL DATA:  Shortness of breath EXAM: PORTABLE CHEST 1 VIEW COMPARISON:   08/17/2023, 12/28/2022 FINDINGS: Cardiomegaly with vascular congestion and possible small right effusion. Atelectasis or scarring at the right base. IMPRESSION: Cardiomegaly with vascular congestion and possible small right effusion. Electronically Signed   By: Jasmine Pang M.D.   On: 10/12/2023 22:05        Scheduled Meds:  atorvastatin  40 mg Oral QHS   donepezil  5 mg Oral QHS   fenofibrate  54 mg Oral Daily   furosemide  40 mg Intravenous BID   gabapentin  100 mg Oral TID   potassium chloride SA  20 mEq Oral Daily   sertraline  50 mg Oral Daily   Continuous Infusions:   LOS: 1 day    Time spent: 35 minutes.    Berton Mount, MD  [facility] Pager #: [phone] 7PM-7AM contact night coverage as above

## 2023-10-15 ENCOUNTER — Encounter: Payer: Self-pay | Admitting: Internal Medicine

## 2023-10-15 DIAGNOSIS — I5033 Acute on chronic diastolic (congestive) heart failure: Secondary | ICD-10-CM | POA: Diagnosis not present

## 2023-10-15 LAB — RENAL FUNCTION PANEL
Albumin: 3.2 g/dL — ABNORMAL LOW (ref 3.5–5.0)
Anion gap: 10 (ref 5–15)
BUN: 15 mg/dL (ref 8–23)
CO2: 26 mmol/L (ref 22–32)
Calcium: 9 mg/dL (ref 8.9–10.3)
Chloride: 103 mmol/L (ref 98–111)
Creatinine, Ser: 0.95 mg/dL (ref 0.61–1.24)
GFR, Estimated: >60 mL/min (ref >60)
Glucose, Bld: 110 mg/dL — ABNORMAL HIGH (ref 70–99)
Phosphorus: 2.7 mg/dL (ref 2.5–4.6)
Potassium: 3.4 mmol/L — ABNORMAL LOW (ref 3.5–5.1)
Sodium: 139 mmol/L (ref 135–145)

## 2023-10-15 LAB — MAGNESIUM: Magnesium: 2.1 mg/dL (ref 1.7–2.4)

## 2023-10-15 MED ORDER — TORSEMIDE 20 MG PO TABS: 20.0000 mg | ORAL_TABLET | Freq: Every day | ORAL | 0 refills | Status: DC

## 2023-10-15 MED ORDER — POTASSIUM CHLORIDE 20 MEQ PO PACK
40.0000 meq | PACK | Freq: Once | ORAL | Status: AC
Start: 2023-10-15 — End: 2023-10-15
  Administered 2023-10-15: 40 meq via ORAL
  Filled 2023-10-15: qty 2

## 2023-10-15 NOTE — Progress Notes (Signed)
Patient has been provided discharge instructions to include follow up appointments, medications, and prescriptions. Patient's daughter is at bedside and verbalizes understanding of instructions.

## 2023-10-15 NOTE — Evaluation (Signed)
Occupational Therapy Evaluation Patient Details Name: Larry Elliott MRN: [id number] DOB: Nov 22, 1945 Today's Date: 10/15/2023   History of Present Illness Pt is a 77 y.o. male who presented 10/12/23 with SOB and lower extremity edema. Admitted with acute on chronic diastolic CHF. PMH: CAD, diverticulosis, HLD, HTN, insomnia, melanoma, prostate CA, L THA anterior approach 12/27/22   Clinical Impression   At baseline, pt is largely Independent to Mod I with ADLs, IADLs, and functional mobility with intermittent use of SPC and drives. At baseline, pt family assists with IADLs as needed. Per daughter and pt report, pt with declining memory over past few months with pt having difficulty managing medications and reports pt is currently at cognitive baseline and at or within 95% of PLOF with functional tasks. Pt now presents with mild balance deficits noted in functional tasks in standing/stepping, mildly decreased activity tolerance, impaired safety awareness and decreased insight into deficits, and mildly decreased safety and independence with functional tasks. Pt currently demonstrates ability to complete ADLs Independent to Contact guard assist for safety and functional transfers/mobility with use ow RW with close Supervision to Contact guard assist for safety. Pt's VSS on RA throughout session. Pt participated well in session and has a supportive family. Per daughter, pt will be receiving medications in pill pouches beginning in January 2025 and family is planning to assist pt and his wife in transition to ALF in the near future. Pt will benefit from acute skilled OT services to address deficits outlined below, increase safety and independence with functional tasks, and decrease risk of re-hospitalization. No post-acute skilled OT needs are anticipated at this time.       If plan is discharge home, recommend the following: A little help with walking and/or transfers;A little help with  bathing/dressing/bathroom;Assistance with cooking/housework;Direct supervision/assist for medications management;Direct supervision/assist for financial management;Assist for transportation;Help with stairs or ramp for entrance;Supervision due to cognitive status    Functional Status Assessment  Patient has had a recent decline in their functional status and demonstrates the ability to make significant improvements in function in a reasonable and predictable amount of time.  Equipment Recommendations  None recommended by OT (Pt already has needed equipment. Medication dispenser with alarm functions may be helpful in the future if pt is unable to manage medication with trial of pill pouches.)    Recommendations for Other Services       Precautions / Restrictions Precautions Precautions: Fall Restrictions Weight Bearing Restrictions Per Provider Order: No      Mobility Bed Mobility               General bed mobility comments: Pt sitting in recliner at beginning and end of session.    Transfers Overall transfer level: Needs assistance Equipment used: None, Rolling walker (2 wheels) Transfers: Sit to/from Stand, Bed to chair/wheelchair/BSC Sit to Stand: Contact guard assist (for safety; no AD used)     Step pivot transfers: Contact guard assist (with RW; CGA for safety)            Balance Overall balance assessment: Mild deficits observed, not formally tested                                         ADL either performed or assessed with clinical judgement   ADL Overall ADL's : Needs assistance/impaired Eating/Feeding: Independent;Sitting   Grooming: Contact guard assist;Standing Grooming Details (indicate cue type  and reason): CGA for safety, no physical assistance and no cues needed Upper Body Bathing: Supervision/ safety;Sitting   Lower Body Bathing: Supervison/ safety;Sitting/lateral leans;Contact guard assist;Sit to/from stand;Cueing for  safety Lower Body Bathing Details (indicate cue type and reason): Sup to CGA for safety, no physical assistance needed Upper Body Dressing : Independent;Sitting   Lower Body Dressing: Supervision/safety;Sitting/lateral leans;Sit to/from stand   Toilet Transfer: Contact guard assist;Ambulation;Regular Toilet;Rolling walker (2 wheels);Grab bars;Cueing for safety Toilet Transfer Details (indicate cue type and reason): CGA for safety, no physical assistance needed Toileting- Clothing Manipulation and Hygiene: Contact guard assist;Sitting/lateral lean;Sit to/from stand (cues for thouroughness with peri-care following BM)       Functional mobility during ADLs: Contact guard assist;Rolling walker (2 wheels);Cueing for safety General ADL Comments: Pt at or within 95% of baseline PLOF. OT recommends Supervision to Contact guard assist for ADLs in sit/stand and functional mobility for safety due to decreased short term memory, decreased safety awareness, and mild balance deficits noted. Pt with history of forgetting to take medications. Pt to trial use of pill pouches starting in January 2025. Pt na daughter also educated in availability of medication deispensers with alarm functions to assist with medication management if pt is not able to manage pill pouches with pt and daughter verbalizing understanding. OT also discussed OT's concern with pt driving with pt and daughter due to pt's current cognitive status with pt listening but not responding and daughter verbalizing understanding and agreement and stating family is currently taking steps to address this.     Vision Baseline Vision/History: 1 Wears glasses Ability to See in Adequate Light: 0 Adequate (with glasses) Patient Visual Report: No change from baseline       Perception         Praxis         Pertinent Vitals/Pain Pain Assessment Pain Assessment: Faces Faces Pain Scale: Hurts little more Pain Location: R knee Pain Descriptors /  Indicators: Discomfort, Grimacing, Guarding, Sore Pain Intervention(s): Limited activity within patient's tolerance, Monitored during session, Repositioned     Extremity/Trunk Assessment Upper Extremity Assessment Upper Extremity Assessment: Left hand dominant;Overall [facility] for tasks assessed   Lower Extremity Assessment Lower Extremity Assessment: Defer to PT evaluation   Cervical / Trunk Assessment Cervical / Trunk Assessment: Normal   Communication Communication Communication: Hearing impairment Cueing Techniques: Other (comments) (requires increased volume)   Cognition Arousal: Alert Behavior During Therapy: WFL for tasks assessed/performed Overall Cognitive Status: History of cognitive impairments - at baseline                                 General Comments: Pt AAOx4 and pleasant throughout session. Pt able to follow 1 and 2-step instructions consistently and sequance familiar tasks appropriately. However, pt with memory deficits at baseline with daughter reporting this has worsened over the past few months. Per daughter, pt is at cognitive baseline. Pt with noted short-term memory deficits during session. For example, OT handed pt a straw which patient unwrapped and laid down then pt opened can of soda and instead of picking up just opened straw, pt took straw from his cup of water to move into can of soda.     General Comments  Pt's VSS on RA throughout session. Pt's daughter, who is a PT, present throughout session and very helpful and supportive. Plan for pt to move to ALF near daughter soon with pt stating ALF has a room  available for pt and his wife. OT supportive of and in agreement with family decision to pursue ALF.    Exercises     Shoulder Instructions      Home Living Family/patient expects to be discharged to:: Private residence Living Arrangements: Spouse/significant other;Children (adult son) Available Help at Discharge: Family;Available 24  hours/day Type of Home: House Home Access: Stairs to enter [employer] of Steps: 2 Entrance Stairs-Rails: Can reach both Home Layout: One level     Bathroom Shower/Tub: Producer, television/film/video: Standard Bathroom Accessibility: Yes How Accessible: Accessible via walker Home Equipment: Rolling Walker (2 wheels);BSC/3in1;Shower seat;Grab bars - tub/shower;Grab bars - toilet;Cane - single point;Hand held shower head;Shower seat - built in;Other (comment) (garage has non-slip floor)   Additional Comments: Daughter is in the process of assisting pt and his wife in transition to ALF in [place], which is closer to pt's daughter's home. Both pt and his wife have memory issues per daughter. Son assisting at home PRN at this time. Daughter lives in [place] area and works as a PT and assists pt as able/needed.      Prior Functioning/Environment Prior Level of Function : Independent/Modified Independent             Mobility Comments: Intermittently uses SPC in L hand, otherwise no AD ADLs Comments: independent, driving; memory issues worsening recently, daughter arranged for an organized pill pack delivery system for med management which will start in January 2025        OT Problem List: Impaired balance (sitting and/or standing);Decreased safety awareness      OT Treatment/Interventions: Self-care/ADL training;Therapeutic exercise;DME and/or AE instruction;Patient/family education;Balance training;Therapeutic activities    OT Goals(Current goals can be found in the care plan section) Acute Rehab OT Goals Patient Stated Goal: to return home OT Goal Formulation: With patient/family Time For Goal Achievement: 10/29/23 Potential to Achieve Goals: Good ADL Goals Pt Will Perform Grooming: with modified independence;standing Pt Will Perform Lower Body Dressing: with modified independence;sit to/from stand;sitting/lateral leans Pt Will Transfer to Toilet: with  modified independence;ambulating;regular height toilet (with least restrictive AD)  OT Frequency: Min 1X/week    Co-evaluation              AM-PAC OT "6 Clicks" Daily Activity     Outcome Measure Help from another person eating meals?: None Help from another person taking care of personal grooming?: A Little Help from another person toileting, which includes using toliet, bedpan, or urinal?: A Little Help from another person bathing (including washing, rinsing, drying)?: A Little Help from another person to put on and taking off regular upper body clothing?: None Help from another person to put on and taking off regular lower body clothing?: A Little 6 Click Score: 20   End of Session Equipment Utilized During Treatment: Gait belt;Rolling walker (2 wheels) Nurse Communication: Mobility status  Activity Tolerance: Patient tolerated treatment well Patient left: in chair;with call bell/phone within reach;with family/visitor present  OT Visit Diagnosis: Unsteadiness on feet (R26.81)                Time: 1610-9604 OT Time Calculation (min): 30 min Charges:  OT General Charges $OT Visit: 1 Visit OT Evaluation $OT Eval Low Complexity: 1 Low OT Treatments $Self Care/Home Management : 8-22 mins  Arli Bree "Orson Eva., OTR/L, MA Acute Rehab [phone]   Lendon Colonel 10/15/2023, 10:34 AM

## 2023-10-15 NOTE — Discharge Summary (Signed)
Physician Discharge Summary  Patient ID: Larry Elliott MRN: [id number] DOB/AGE: 79-Nov-1947 79 y.o.  Admit date: 10/12/2023 Discharge date: 10/15/2023  Admission Diagnoses:  Discharge Diagnoses:  Principal Problem:   Acute on chronic diastolic heart failure (HCC) Active Problems:   Hyperlipidemia   Essential hypertension   Depression   Acute hypoxic respiratory failure (HCC)   Thoracic aortic aneurysm (TAA) (HCC)   Chronic right hip pain   Discharged Condition: stable  Hospital Course:  Patient is a 77 year old Caucasian male with history of diastolic congestive heart failure, obesity, hypertension, hyperlipidemia, chronic right hip pain. Patient was admitted with acute on chronic diastolic CHF.  Patient was admitted and managed with IV Lasix.  Patient will be discharged on torsemide.  Aldactone and Jardiance will be restarted on discharge.  Patient will follow-up with primary care provider and cardiology team within 1 week of discharge.  Acute respiratory failure with hypoxia secondary to acute on chronic diastolic CHF exacerbation -Presented with 3 to 4 days of progressive shortness of breath, orthopnea, peripheral edema.  Chest x-ray with increased pulmonary vascular congestion. -2D echo 07/2023 had shown EF of 60 to 65% -Patient was managed with IV Lasix.   -Symptoms have resolved.   -Strict I's and O's and daily weights -Lower PCP and cardiology team on discharge. -Patient follows cardiology, Dr. Bjorn Pippin -Will restart Aldactone and SGLT2 inhibitor.   -CTA chest negative for PE.  Enlarged main pulmonary artery compatible with pulmonary artery hypertension.  Hypokalemia: -Continue to monitor and replete.  Hypertension: -Controlled.  Hypertriglyceridemia: -Continue fenofibrate.  Obesity: -Diet and exercise.  Generalized debility, FTT -PT OT input is highly appreciated. -Family is considering assisted living facility.    Consults: None   Treatments: Patient  was managed with IV Lasix.  Abnormal electrolytes were corrected.  Discharge Exam: Blood pressure 128/79, pulse 81, temperature 98.2 F (36.8 C), temperature source Oral, resp. rate 17, height 5\' 10"  (1.778 m), weight 110.2 kg, SpO2 95%.   Disposition: Discharge disposition: 01-Home or Self Care       Discharge Instructions     Diet - low sodium heart healthy   Complete by: As directed    Discharge wound care:   Complete by: As directed    Continue current wound care plan.   Increase activity slowly   Complete by: As directed       Allergies as of 10/15/2023       Reactions   Ambien [zolpidem] Other (See Comments)   Encephalopathy, admitted 04-2022, symptoms suspected to be from [place]   Aspirin Swelling   swelling in face   Hydrocodone Other (See Comments)   Insomnia, mild SOB (w/o cough-wheezing)   Penicillins Swelling   swelling in face        Medication List     STOP taking these medications    furosemide 40 MG tablet Commonly known as: LASIX   ondansetron 4 MG tablet Commonly known as: ZOFRAN       TAKE these medications    atorvastatin 40 MG tablet Commonly known as: LIPITOR Take 1 tablet (40 mg total) by mouth at bedtime.   donepezil 5 MG tablet Commonly known as: ARICEPT Take 1 tablet (5 mg total) by mouth at bedtime.   empagliflozin 10 MG Tabs tablet Commonly known as: JARDIANCE Take 1 tablet (10 mg total) by mouth daily.   fenofibrate micronized 134 MG capsule Commonly known as: LOFIBRA Take 1 capsule (134 mg total) by mouth daily before breakfast.   gabapentin 100  MG capsule Commonly known as: NEURONTIN Take 1 capsule (100 mg total) by mouth 3 (three) times daily.   melatonin 5 MG Tabs Take 1 tablet (5 mg total) by mouth at bedtime.   potassium chloride SA 20 MEQ tablet Commonly known as: KLOR-CON M Take 1 tablet (20 mEq total) by mouth daily.   sertraline 50 MG tablet Commonly known as: ZOLOFT Take 1 tablet (50 mg total)  by mouth daily.   spironolactone 25 MG tablet Commonly known as: ALDACTONE Take 1 tablet (25 mg total) by mouth daily.   torsemide 20 MG tablet Commonly known as: DEMADEX Take 1 tablet (20 mg total) by mouth daily.               Discharge Care Instructions  (From admission, onward)           Start     Ordered   10/15/23 0000  Discharge wound care:       Comments: Continue current wound care plan.   10/15/23 1408            Time spent: 35 minutes.  SignedBarnetta Chapel 10/15/2023, 2:10 PM

## 2023-10-15 NOTE — TOC Transition Note (Signed)
Transition of Care [facility]) - Discharge Note   Patient Details  Name: Larry Elliott MRN: [id number] Date of Birth: 25-Apr-1946  Transition of Care [facility]) CM/SW Contact:  Lawerance Sabal, RN Phone Number: 10/15/2023, 2:32 PM   Clinical Narrative:    Spoke w patient and daughter at bedside, patient is active w [facility].  LVM and text to [place] at [facility] for patient to resume [facility] services and added to AVS  Final next level of care: Home w Home Health Services Barriers to Discharge: No Barriers Identified   Patient Goals and CMS Choice Patient states their goals for this hospitalization and ongoing recovery are:: to return home CMS Medicare.gov Compare Post Acute Care list provided to:: Patient Choice offered to / list presented to : Patient      Discharge Placement                       Discharge Plan and Services Additional resources added to the After Visit Summary for                  DME Arranged: N/A         HH Arranged: RN HH Agency: [facility] Date [facility] Agency Contacted: 10/15/23 Time HH Agency Contacted: 1431 Representative spoke with at [facility] Agency: [place] [facility]  Social Drivers of Health (SDOH) Interventions SDOH Screenings   Food Insecurity: No Food Insecurity (10/13/2023)  Housing: Unknown (10/13/2023)  Transportation Needs: No Transportation Needs (10/13/2023)  Utilities: Not At Risk (10/13/2023)  Alcohol Screen: Low Risk  (04/17/2023)  Depression (PHQ2-9): Low Risk  (08/08/2023)  Financial Resource Strain: Low Risk  (10/11/2023)  Physical Activity: Unknown (10/11/2023)  Social Connections: Socially Isolated (10/11/2023)  Stress: Stress Concern Present (10/11/2023)  Tobacco Use: Low Risk  (10/13/2023)     Readmission Risk Interventions     No data to display

## 2023-10-15 NOTE — Evaluation (Signed)
Physical Therapy Evaluation Patient Details Name: SALAAM CERESA MRN: [id number] DOB: 09/13/1946 Today's Date: 10/15/2023  History of Present Illness  Pt is a 77 y.o. male who presented 10/12/23 with SOB and lower extremity edema. Admitted with acute on chronic diastolic CHF. PMH: CAD, diverticulosis, HLD, HTN, insomnia, melanoma, prostate CA, L THA anterior approach 12/27/22   Clinical Impression  Pt presents with condition above and deficits mentioned below, see PT Problem List. PTA, he was independent, intermittently utilizing a SPC but often not utilizing any AD for functional mobility. Pt lives with his wife and son in a 1-level house with 2 STE. Currently, pt is demonstrating deficits in gross strength, balance, and activity tolerance and displays edema in his bil legs. He was limited by chronic R knee pain (old hockey injury per pt) today, ambulating with an antalgic gait pattern at a slow pace with decreased bil step length. However, he was able to perform all functional mobility, including ambulating without an AD, at a CGA level without LOB. Educated pt and daughter on healthy eating at home, concerns in regards to pt's memory and need for supervision with medication management, and daughter's plan for the pt and his wife (who also has memory deficits) to go to an ALF very soon. Daughter is working on finalizing this arrangement with an ALF in [place] near her at this time. In the interim, recommending pt return home and resume HHPT. He could benefit from [facility] RN for medication management as well in case pt has difficulty with the pill packs coming in the mail. Will continue to follow acutely.      If plan is discharge home, recommend the following: A little help with bathing/dressing/bathroom;Direct supervision/assist for medications management;Direct supervision/assist for financial management;Assist for transportation;Help with stairs or ramp for entrance;Supervision due to cognitive status    Can travel by private vehicle        Equipment Recommendations None recommended by PT  Recommendations for Other Services       Functional Status Assessment Patient has had a recent decline in their functional status and demonstrates the ability to make significant improvements in function in a reasonable and predictable amount of time.     Precautions / Restrictions Precautions Precautions: Fall Restrictions Weight Bearing Restrictions Per Provider Order: No      Mobility  Bed Mobility               General bed mobility comments: Pt sitting EOB upon arrival, sitting in recliner end of session    Transfers Overall transfer level: Needs assistance Equipment used: None Transfers: Sit to/from Stand Sit to Stand: Contact guard assist           General transfer comment: CGA for safety standing from EOB, no LOB    Ambulation/Gait Ambulation/Gait assistance: Contact guard assist Gait Distance (Feet): 200 Feet Assistive device: None Gait Pattern/deviations: Step-through pattern, Decreased stride length, Antalgic Gait velocity: reduced Gait velocity interpretation: <1.8 ft/sec, indicate of risk for recurrent falls   General Gait Details: Pt initially ambulating with very small steps and an antalgic gait pattern due to R knee soreness (old hockey injury per pt). This improved as distance progressed. No LOB, CGA for safety  Stairs            Wheelchair Mobility     Tilt Bed    Modified Rankin (Stroke Patients Only)       Balance Overall balance assessment: Mild deficits observed, not formally tested  Pertinent Vitals/Pain Pain Assessment Pain Assessment: Faces Faces Pain Scale: Hurts little more Pain Location: R knee Pain Descriptors / Indicators: Discomfort, Grimacing, Guarding, Sore Pain Intervention(s): Limited activity within patient's tolerance, Monitored during session,  Repositioned    Home Living Family/patient expects to be discharged to:: Private residence Living Arrangements: Spouse/significant other;Children (adult son) Available Help at Discharge: Family;Available 24 hours/day Type of Home: House Home Access: Stairs to enter Entrance Stairs-Rails: Can reach both Entrance Stairs-Number of Steps: 2   Home Layout: One level Home Equipment: Agricultural consultant (2 wheels);BSC/3in1;Shower seat;Grab bars - tub/shower;Grab bars - toilet;Cane - single point Additional Comments: due to his memory deficits and his wife's memory deficits, daughter is setting them up to go to an ALF very soon    Prior Function Prior Level of Function : Independent/Modified Independent             Mobility Comments: Intermittently uses SPC in L hand, otherwise no AD ADLs Comments: independent, driving; memory issues worsening recently, daughter arranged for an organized pill pack delivery system for med management     Extremity/Trunk Assessment   Upper Extremity Assessment Upper Extremity Assessment: Defer to OT evaluation    Lower Extremity Assessment Lower Extremity Assessment: Generalized weakness (bil grade 1 edema)    Cervical / Trunk Assessment Cervical / Trunk Assessment: Normal  Communication   Communication Communication: Hearing impairment  Cognition Arousal: Alert Behavior During Therapy: WFL for tasks assessed/performed Overall Cognitive Status: History of cognitive impairments - at baseline                                 General Comments: Memory deficits at baseline, daughter reports this has worsened recently. Pt repeating himself several times in regards to his daughter being a PT and that he used to play hockey        General Comments General comments (skin integrity, edema, etc.): discussed with pt and daughter in regards to healthy eating and concerns involving his memory and need for supervision with medication management along  with daughter's plan to get pt and his wife into an ALF soon; VSS on RA    Exercises     Assessment/Plan    PT Assessment Patient needs continued PT services  PT Problem List Decreased strength;Decreased activity tolerance;Decreased balance;Decreased mobility;Decreased cognition;Pain       PT Treatment Interventions DME instruction;Gait training;Functional mobility training;Stair training;Therapeutic activities;Balance training;Therapeutic exercise;Neuromuscular re-education;Cognitive remediation;Patient/family education    PT Goals (Current goals can be found in the Care Plan section)  Acute Rehab PT Goals Patient Stated Goal: to go home PT Goal Formulation: With patient/family Time For Goal Achievement: 10/29/23 Potential to Achieve Goals: Good    Frequency Min 1X/week     Co-evaluation               AM-PAC PT "6 Clicks" Mobility  Outcome Measure Help needed turning from your back to your side while in a flat bed without using bedrails?: None Help needed moving from lying on your back to sitting on the side of a flat bed without using bedrails?: A Little Help needed moving to and from a bed to a chair (including a wheelchair)?: A Little Help needed standing up from a chair using your arms (e.g., wheelchair or bedside chair)?: A Little Help needed to walk in hospital room?: A Little Help needed climbing 3-5 steps with a railing? : A Little 6 Click Score: 19  End of Session Equipment Utilized During Treatment: Gait belt Activity Tolerance: Patient tolerated treatment well Patient left: in chair;with call bell/phone within reach;with nursing/sitter in room;with family/visitor present Nurse Communication: Mobility status PT Visit Diagnosis: Unsteadiness on feet (R26.81);Other abnormalities of gait and mobility (R26.89);Difficulty in walking, not elsewhere classified (R26.2);Pain;Muscle weakness (generalized) (M62.81) Pain - Right/Left: Right Pain - part of body:  Knee    Time: 0811-0833 PT Time Calculation (min) (ACUTE ONLY): 22 min   Charges:   PT Evaluation $PT Eval Low Complexity: 1 Low   PT General Charges $$ ACUTE PT VISIT: 1 Visit         Virgil Benedict, PT, DPT Acute Rehabilitation Services  Office: [phone]   Bettina Gavia 10/15/2023, 8:47 AM

## 2023-10-16 ENCOUNTER — Encounter: Payer: Self-pay | Admitting: Internal Medicine

## 2023-10-16 ENCOUNTER — Ambulatory Visit: Payer: Medicare Other

## 2023-10-17 ENCOUNTER — Telehealth: Payer: Self-pay | Admitting: *Deleted

## 2023-10-17 DIAGNOSIS — E669 Obesity, unspecified: Secondary | ICD-10-CM | POA: Diagnosis not present

## 2023-10-17 DIAGNOSIS — Z6834 Body mass index (BMI) 34.0-34.9, adult: Secondary | ICD-10-CM | POA: Diagnosis not present

## 2023-10-17 DIAGNOSIS — Z556 Problems related to health literacy: Secondary | ICD-10-CM | POA: Diagnosis not present

## 2023-10-17 DIAGNOSIS — F419 Anxiety disorder, unspecified: Secondary | ICD-10-CM | POA: Diagnosis not present

## 2023-10-17 DIAGNOSIS — J9601 Acute respiratory failure with hypoxia: Secondary | ICD-10-CM | POA: Diagnosis not present

## 2023-10-17 DIAGNOSIS — G8929 Other chronic pain: Secondary | ICD-10-CM | POA: Diagnosis not present

## 2023-10-17 DIAGNOSIS — M25551 Pain in right hip: Secondary | ICD-10-CM | POA: Diagnosis not present

## 2023-10-17 DIAGNOSIS — I2510 Atherosclerotic heart disease of native coronary artery without angina pectoris: Secondary | ICD-10-CM | POA: Diagnosis not present

## 2023-10-17 DIAGNOSIS — Z85828 Personal history of other malignant neoplasm of skin: Secondary | ICD-10-CM | POA: Diagnosis not present

## 2023-10-17 DIAGNOSIS — F32A Depression, unspecified: Secondary | ICD-10-CM | POA: Diagnosis not present

## 2023-10-17 DIAGNOSIS — I110 Hypertensive heart disease with heart failure: Secondary | ICD-10-CM | POA: Diagnosis not present

## 2023-10-17 DIAGNOSIS — Z8546 Personal history of malignant neoplasm of prostate: Secondary | ICD-10-CM | POA: Diagnosis not present

## 2023-10-17 DIAGNOSIS — E876 Hypokalemia: Secondary | ICD-10-CM | POA: Diagnosis not present

## 2023-10-17 DIAGNOSIS — I5033 Acute on chronic diastolic (congestive) heart failure: Secondary | ICD-10-CM | POA: Diagnosis not present

## 2023-10-17 DIAGNOSIS — E781 Pure hyperglyceridemia: Secondary | ICD-10-CM | POA: Diagnosis not present

## 2023-10-17 DIAGNOSIS — G4700 Insomnia, unspecified: Secondary | ICD-10-CM | POA: Diagnosis not present

## 2023-10-17 DIAGNOSIS — Z7984 Long term (current) use of oral hypoglycemic drugs: Secondary | ICD-10-CM | POA: Diagnosis not present

## 2023-10-17 DIAGNOSIS — Z5982 Transportation insecurity: Secondary | ICD-10-CM | POA: Diagnosis not present

## 2023-10-17 DIAGNOSIS — I7121 Aneurysm of the ascending aorta, without rupture: Secondary | ICD-10-CM | POA: Diagnosis not present

## 2023-10-17 NOTE — Transitions of Care (Post Inpatient/ED Visit) (Signed)
   10/17/2023  Name: Larry Elliott MRN: [id number] DOB: 11/19/1945  Today's TOC FU Call Status: Today's TOC FU Call Status:: Unsuccessful Call (1st Attempt) Unsuccessful Call (1st Attempt) Date: 10/17/23  Attempted to reach the patient regarding the most recent Inpatient/ED visit.  Follow Up Plan: Additional outreach attempts will be made to reach the patient to complete the Transitions of Care (Post Inpatient/ED visit) call.   Gean Maidens BSN RN Population Health- Transition of Care Team.  [facility] [phone]

## 2023-10-17 NOTE — Transitions of Care (Post Inpatient/ED Visit) (Signed)
   10/17/2023  Name: Larry Elliott MRN: [id number] DOB: May 10, 1946  Today's TOC FU Call Status: Today's TOC FU Call Status:: Unsuccessful Call (2nd Attempt) Unsuccessful Call (2nd Attempt) Date: 10/17/23  Attempted to reach the patient regarding the most recent Inpatient/ED visit.  Follow Up Plan: Additional outreach attempts will be made to reach the patient to complete the Transitions of Care (Post Inpatient/ED visit) call.   Gean Maidens BSN RN Population Health- Transition of Care Team.  [facility] [phone]

## 2023-10-23 DIAGNOSIS — E781 Pure hyperglyceridemia: Secondary | ICD-10-CM | POA: Diagnosis not present

## 2023-10-23 DIAGNOSIS — I5033 Acute on chronic diastolic (congestive) heart failure: Secondary | ICD-10-CM | POA: Diagnosis not present

## 2023-10-23 DIAGNOSIS — F32A Depression, unspecified: Secondary | ICD-10-CM | POA: Diagnosis not present

## 2023-10-23 DIAGNOSIS — J9601 Acute respiratory failure with hypoxia: Secondary | ICD-10-CM | POA: Diagnosis not present

## 2023-10-23 DIAGNOSIS — I110 Hypertensive heart disease with heart failure: Secondary | ICD-10-CM | POA: Diagnosis not present

## 2023-10-23 DIAGNOSIS — I7121 Aneurysm of the ascending aorta, without rupture: Secondary | ICD-10-CM | POA: Diagnosis not present

## 2023-10-24 ENCOUNTER — Telehealth: Payer: Self-pay

## 2023-10-24 ENCOUNTER — Ambulatory Visit: Payer: Medicare Other | Admitting: Family Medicine

## 2023-10-24 ENCOUNTER — Telehealth: Payer: Self-pay | Admitting: Internal Medicine

## 2023-10-24 NOTE — Telephone Encounter (Signed)
Pt's daughter dropped off document to be filled out by provider [facility] 2 pages) Pt's daughter stated ok to fax document when ready to Fax # [phone]. Document put at front office tray under providers name.

## 2023-10-24 NOTE — Telephone Encounter (Signed)
 Received, will complete on Thursday when we return to office.

## 2023-10-24 NOTE — Transitions of Care (Post Inpatient/ED Visit) (Signed)
   10/24/2023  Name: Larry Elliott MRN: [id number] DOB: [birth year]  Today's TOC FU Call Status: Today's TOC FU Call Status:: Unsuccessful Call (3rd Attempt) Unsuccessful Call (3rd Attempt) Date: 10/24/23  Attempted to reach the patient regarding the most recent Inpatient/ED visit.  Follow Up Plan: No further outreach attempts will be made at this time. We have been unable to contact the patient.  Alan Ee, RN, BSN, CEN [employer]- Transition of Care Team.  [facility] [phone]

## 2023-10-26 DIAGNOSIS — I7121 Aneurysm of the ascending aorta, without rupture: Secondary | ICD-10-CM | POA: Diagnosis not present

## 2023-10-26 DIAGNOSIS — F32A Depression, unspecified: Secondary | ICD-10-CM | POA: Diagnosis not present

## 2023-10-26 DIAGNOSIS — E781 Pure hyperglyceridemia: Secondary | ICD-10-CM | POA: Diagnosis not present

## 2023-10-26 DIAGNOSIS — I110 Hypertensive heart disease with heart failure: Secondary | ICD-10-CM | POA: Diagnosis not present

## 2023-10-26 DIAGNOSIS — I5033 Acute on chronic diastolic (congestive) heart failure: Secondary | ICD-10-CM | POA: Diagnosis not present

## 2023-10-26 DIAGNOSIS — J9601 Acute respiratory failure with hypoxia: Secondary | ICD-10-CM | POA: Diagnosis not present

## 2023-10-26 NOTE — Telephone Encounter (Signed)
 FL2 form completed, placed in PCP red folder to be signed.

## 2023-10-27 DIAGNOSIS — Z0279 Encounter for issue of other medical certificate: Secondary | ICD-10-CM

## 2023-10-27 NOTE — Telephone Encounter (Signed)
 Received fax confirmation

## 2023-10-27 NOTE — Telephone Encounter (Signed)
 Forms faxed to ATTN: Wonda Horner at [phone]. Form sent for scanning.

## 2023-10-31 ENCOUNTER — Telehealth: Payer: Self-pay

## 2023-10-31 DIAGNOSIS — I110 Hypertensive heart disease with heart failure: Secondary | ICD-10-CM | POA: Diagnosis not present

## 2023-10-31 DIAGNOSIS — E781 Pure hyperglyceridemia: Secondary | ICD-10-CM | POA: Diagnosis not present

## 2023-10-31 DIAGNOSIS — F32A Depression, unspecified: Secondary | ICD-10-CM | POA: Diagnosis not present

## 2023-10-31 DIAGNOSIS — I7121 Aneurysm of the ascending aorta, without rupture: Secondary | ICD-10-CM | POA: Diagnosis not present

## 2023-10-31 DIAGNOSIS — I5033 Acute on chronic diastolic (congestive) heart failure: Secondary | ICD-10-CM | POA: Diagnosis not present

## 2023-10-31 DIAGNOSIS — J9601 Acute respiratory failure with hypoxia: Secondary | ICD-10-CM | POA: Diagnosis not present

## 2023-10-31 NOTE — Telephone Encounter (Signed)
 Copied from CRM [id number]. Topic: Clinical - Home Health Verbal Orders >> Oct 31, 2023 11:14 AM Russell PARAS wrote: Caller/Agency: [facility], Kammi(RN) Callback Number: [phone] Service Requested: Wanted to update Dr. Amon that patient has missed 2 nights of prescribed medications: atorvastatin  (LIPITOR) 40 MG tablet, donepezil  (ARICEPT ) 5 MG tablet, gabapentin  (NEURONTIN ) 100 MG capsule, melatonin 5 MG TABS Frequency: N/A Any new concerns about the patient? No

## 2023-10-31 NOTE — Telephone Encounter (Signed)
 Just an Lorain Childes- Pt's daughter [name] is trying to get him placed into assisted living in [place] where she lives.

## 2023-10-31 NOTE — Telephone Encounter (Signed)
 Noted, he does need placement

## 2023-11-01 ENCOUNTER — Ambulatory Visit: Payer: Self-pay | Admitting: Internal Medicine

## 2023-11-01 ENCOUNTER — Ambulatory Visit
Admission: EM | Admit: 2023-11-01 | Discharge: 2023-11-01 | Disposition: A | Discharge status: Home / Self Care | Payer: Medicare Other | Attending: Family Medicine | Admitting: Family Medicine

## 2023-11-01 DIAGNOSIS — E781 Pure hyperglyceridemia: Secondary | ICD-10-CM | POA: Diagnosis not present

## 2023-11-01 DIAGNOSIS — I110 Hypertensive heart disease with heart failure: Secondary | ICD-10-CM | POA: Diagnosis not present

## 2023-11-01 DIAGNOSIS — M25551 Pain in right hip: Secondary | ICD-10-CM | POA: Diagnosis not present

## 2023-11-01 DIAGNOSIS — I5033 Acute on chronic diastolic (congestive) heart failure: Secondary | ICD-10-CM | POA: Diagnosis not present

## 2023-11-01 DIAGNOSIS — F419 Anxiety disorder, unspecified: Secondary | ICD-10-CM | POA: Diagnosis not present

## 2023-11-01 DIAGNOSIS — G8929 Other chronic pain: Secondary | ICD-10-CM | POA: Diagnosis not present

## 2023-11-01 DIAGNOSIS — L03311 Cellulitis of abdominal wall: Secondary | ICD-10-CM | POA: Diagnosis not present

## 2023-11-01 DIAGNOSIS — E876 Hypokalemia: Secondary | ICD-10-CM | POA: Diagnosis not present

## 2023-11-01 DIAGNOSIS — I2510 Atherosclerotic heart disease of native coronary artery without angina pectoris: Secondary | ICD-10-CM | POA: Diagnosis not present

## 2023-11-01 DIAGNOSIS — F32A Depression, unspecified: Secondary | ICD-10-CM | POA: Diagnosis not present

## 2023-11-01 DIAGNOSIS — I7121 Aneurysm of the ascending aorta, without rupture: Secondary | ICD-10-CM | POA: Diagnosis not present

## 2023-11-01 DIAGNOSIS — G4700 Insomnia, unspecified: Secondary | ICD-10-CM | POA: Diagnosis not present

## 2023-11-01 DIAGNOSIS — J9601 Acute respiratory failure with hypoxia: Secondary | ICD-10-CM | POA: Diagnosis not present

## 2023-11-01 MED ORDER — DOXYCYCLINE HYCLATE 100 MG PO CAPS: 100.0000 mg | ORAL_CAPSULE | Freq: Two times a day (BID) | ORAL | 0 refills | Status: DC

## 2023-11-01 MED ORDER — FLUCONAZOLE 150 MG PO TABS: 150.0000 mg | ORAL_TABLET | ORAL | 0 refills | Status: DC

## 2023-11-01 NOTE — ED Provider Notes (Signed)
 [facility] - URGENT CARE CENTER  Note:  This document was prepared using Conservation officer, historic buildings and may include unintentional dictation errors.  MRN: [id number] DOB: [birth year]  Subjective:   Larry Elliott is a 78 y.o. male presenting for 1 day history of a painful red rash over the right pelvic area, lower abdominal area.  No current facility-administered medications for this encounter.  Current Outpatient Medications:    atorvastatin  (LIPITOR) 40 MG tablet, Take 1 tablet (40 mg total) by mouth at bedtime., Disp: 90 tablet, Rfl: 1   donepezil  (ARICEPT ) 5 MG tablet, Take 1 tablet (5 mg total) by mouth at bedtime., Disp: 90 tablet, Rfl: 1   empagliflozin  (JARDIANCE ) 10 MG TABS tablet, Take 1 tablet (10 mg total) by mouth daily., Disp: 90 tablet, Rfl: 1   fenofibrate  micronized (LOFIBRA) 134 MG capsule, Take 1 capsule (134 mg total) by mouth daily before breakfast., Disp: 90 capsule, Rfl: 1   gabapentin  (NEURONTIN ) 100 MG capsule, Take 1 capsule (100 mg total) by mouth 3 (three) times daily., Disp: 270 capsule, Rfl: 0   melatonin 5 MG TABS, Take 1 tablet (5 mg total) by mouth at bedtime., Disp: 30 tablet, Rfl: 0   potassium chloride  SA (KLOR-CON  M) 20 MEQ tablet, Take 1 tablet (20 mEq total) by mouth daily., Disp: 90 tablet, Rfl: 1   sertraline  (ZOLOFT ) 50 MG tablet, Take 1 tablet (50 mg total) by mouth daily., Disp: 90 tablet, Rfl: 1   spironolactone  (ALDACTONE ) 25 MG tablet, Take 1 tablet (25 mg total) by mouth daily., Disp: 90 tablet, Rfl: 1   torsemide  (DEMADEX ) 20 MG tablet, Take 1 tablet (20 mg total) by mouth daily., Disp: 30 tablet, Rfl: 0   Allergies  Allergen Reactions   Ambien  [Zolpidem ] Other (See Comments)    Encephalopathy, admitted 04-2022, symptoms suspected to be from ambien    Aspirin Swelling    swelling in face   Hydrocodone  Other (See Comments)    Insomnia, mild SOB (w/o cough-wheezing)   Penicillins Swelling    swelling in face    Past Medical  History:  Diagnosis Date   Allergy    penicillin & Aspirin   Anxiety 11/04/2013   Arthritis    BCC (basal cell carcinoma), leg    Right calf, L nape of neck at hairline   CAD (coronary artery disease)    CP, cath, non-obstructive   Cataract    been removed   Depression 11/04/2013   Diverticulosis    colon   Erectile dysfunction following radical prostatectomy    Hyperlipidemia    Hypertension    Insomnia    Melanoma (HCC) 2010   face, sees derm    Prostate CA (HCC) 07/2007   s/p robotic surgery   Spermatocele    Left     Past Surgical History:  Procedure Laterality Date   APPENDECTOMY     age 11   EYE SURGERY     cataracts removed   HERNIA REPAIR  2010   JOINT REPLACEMENT  hip 2024   KNEE ARTHROSCOPY Left 1965   meniscal tear   PROSTATECTOMY  07/25/2007    robotic,  for prostate cancer     SHOULDER ARTHROSCOPY Left 07/09/2013   Dr Dozier    SPINE SURGERY     TOTAL HIP ARTHROPLASTY Left 12/27/2022   Procedure: LEFT TOTAL HIP ARTHROPLASTY ANTERIOR APPROACH;  Surgeon: Sheril Coy, MD;  Location: [facility];  Service: Orthopedics;  Laterality: Left;   TRANSFORAMINAL LUMBAR INTERBODY FUSION (  TLIF) WITH PEDICLE SCREW FIXATION 1 LEVEL Right 12/04/2019   Procedure: RIGHT-SIDED TRANSFORAMINAL LUMBAR INTERBODY FUSION LUMBAR FOUR THROUGH FIVE WITH INSTRUMENTATION AND ALLOGRAFT;  Surgeon: Beuford Anes, MD;  Location: [facility] OR;  Service: Orthopedics;  Laterality: Right;    Family History  Problem Relation Age of Onset   Coronary artery disease Father 3       dx age 53s   Stroke Mother    Hypertension Neg Hx    Diabetes Neg Hx    Colon cancer Neg Hx    Prostate cancer Neg Hx     Social History   Tobacco Use   Smoking status: Never   Smokeless tobacco: Never  Vaping Use   Vaping status: Never Used  Substance Use Topics   Alcohol use: Not Currently    Alcohol/week: 1.0 standard drink of alcohol    Types: 1 Glasses of wine per week    Comment: fruit glass size    Drug use: Never    ROS   Objective:   Vitals: BP (!) 149/87 (BP Location: Right Arm)   Pulse 86   Temp 97.6 F (36.4 C) (Oral)   Resp (!) 28   SpO2 95%   Physical Exam Constitutional:      General: He is not in acute distress.    Appearance: Normal appearance. He is well-developed and normal weight. He is not ill-appearing, toxic-appearing or diaphoretic.  HENT:     Head: Normocephalic and atraumatic.     Right Ear: External ear normal.     Left Ear: External ear normal.     Nose: Nose normal.     Mouth/Throat:     Pharynx: Oropharynx is clear.  Eyes:     General: No scleral icterus.       Right eye: No discharge.        Left eye: No discharge.     Extraocular Movements: Extraocular movements intact.  Cardiovascular:     Rate and Rhythm: Normal rate.  Pulmonary:     Effort: Pulmonary effort is normal.  Abdominal:    Musculoskeletal:     Cervical back: Normal range of motion.  Neurological:     Mental Status: He is alert and oriented to person, place, and time.  Psychiatric:        Mood and Affect: Mood normal.        Behavior: Behavior normal.        Thought Content: Thought content normal.        Judgment: Judgment normal.     Assessment and Plan :   PDMP not reviewed this encounter.  1. Cellulitis of abdominal wall    Will manage for cellulitis of the abdominal wall with doxycycline .  As this area is prone to yeast infection, recommended on oral fluconazole  course as well.  Counseled patient on potential for adverse effects with medications prescribed/recommended today, ER and return-to-clinic precautions discussed, patient verbalized understanding.    Christopher Savannah, [place] 11/01/23 [id number]

## 2023-11-01 NOTE — Discharge Instructions (Signed)
 Will manage the skin infection for cellulitis with an antibiotic called doxycycline. Will also cover for yeast infection with fluconazole.

## 2023-11-01 NOTE — Telephone Encounter (Signed)
 Chief Complaint: Painful rash in groin Symptoms: Painful rash in groin that appears red and weltlike, with white spots Frequency: Constant since this morning Pertinent Negatives: Patient denies fever, itching Disposition: [] ED /[x] Urgent Care (no appt availability in office) / [] Appointment(In office/virtual)/ []  [facility]/ [] Home Care/ [] Refused Recommended Disposition /[] [facility]/ []  Follow-up with PCP Additional Notes: Patient and wife called in stating patient has a painful rash on his groin area that appears red, welt-like, and white spots as well. Patient rates pain 9/10 but states that there is no itching. No appointment available for today in-office or virtually, patient and wife agreed to walk-in UC at [facility]  Copied from CRM [id number]. Topic: Clinical - Red Word Triage >> Nov 01, 2023  9:51 AM Thersia BROCKS wrote: [facility] that prompted transfer to Nurse Triage: Patient stated he has a painful rash and is in a lot of pain Reason for Disposition  [1] Looks infected (spreading redness, pus) AND [2] no fever  Answer Assessment - Initial Assessment Questions 1. APPEARANCE of RASH: Describe the rash.      Very red in groin and spots 2. LOCATION: Where is the rash located?      Localized to groin 3. NUMBER: How many spots are there?      A big welt 5. ONSET: When did the rash start?      This morning 6. ITCHING: Does the rash itch? If Yes, ask: How bad is the itch?  (Scale 0-10; or none, mild, moderate, severe)     Only painful, not itchy 7. PAIN: Does the rash hurt? If Yes, ask: How bad is the pain?  (Scale 0-10; or none, mild, moderate, severe)    - NONE (0): no pain    - MILD (1-3): doesn't interfere with normal activities     - MODERATE (4-7): interferes with normal activities or awakens from sleep     - SEVERE (8-10): excruciating pain, unable to do any normal activities     9+  8. OTHER SYMPTOMS: Do you have any other  symptoms? (e.g., fever)     No  Protocols used: Rash or Redness - Localized-A-AH

## 2023-11-01 NOTE — ED Triage Notes (Signed)
 Pt states rash to right groin since yesterday.

## 2023-11-02 ENCOUNTER — Telehealth: Payer: Self-pay

## 2023-11-02 DIAGNOSIS — I110 Hypertensive heart disease with heart failure: Secondary | ICD-10-CM | POA: Diagnosis not present

## 2023-11-02 DIAGNOSIS — I5033 Acute on chronic diastolic (congestive) heart failure: Secondary | ICD-10-CM | POA: Diagnosis not present

## 2023-11-02 DIAGNOSIS — J9601 Acute respiratory failure with hypoxia: Secondary | ICD-10-CM | POA: Diagnosis not present

## 2023-11-02 DIAGNOSIS — E781 Pure hyperglyceridemia: Secondary | ICD-10-CM | POA: Diagnosis not present

## 2023-11-02 DIAGNOSIS — I7121 Aneurysm of the ascending aorta, without rupture: Secondary | ICD-10-CM | POA: Diagnosis not present

## 2023-11-02 DIAGNOSIS — F32A Depression, unspecified: Secondary | ICD-10-CM | POA: Diagnosis not present

## 2023-11-02 NOTE — Telephone Encounter (Signed)
 Plan of care signed and faxed back to [facility] at [phone]. Form sent for scanning.

## 2023-11-03 ENCOUNTER — Telehealth: Payer: Self-pay

## 2023-11-03 NOTE — Progress Notes (Signed)
 Complex Care Management Care Guide Note  11/03/2023 Name: [name] MRN: [id number] DOB: [birth year]  Larry Elliott is a 78 y.o. year old male who is a primary care patient of Amon Aloysius BRAVO, MD and is actively engaged with the care management team. I reached out to Elsie JINNY Elbe by phone today to assist with re-scheduling  with the Pharmacist.  Follow up plan: Unsuccessful telephone outreach attempt made. A HIPAA compliant phone message was left for the patient providing contact information and requesting a return call.  Jeoffrey Buffalo , RMA     [facility]  [facility], [facility] Guide  Direct Dial: [phone]  Website: [email or web address]

## 2023-11-07 DIAGNOSIS — F32A Depression, unspecified: Secondary | ICD-10-CM | POA: Diagnosis not present

## 2023-11-07 DIAGNOSIS — I5033 Acute on chronic diastolic (congestive) heart failure: Secondary | ICD-10-CM | POA: Diagnosis not present

## 2023-11-07 DIAGNOSIS — J9601 Acute respiratory failure with hypoxia: Secondary | ICD-10-CM | POA: Diagnosis not present

## 2023-11-07 DIAGNOSIS — E781 Pure hyperglyceridemia: Secondary | ICD-10-CM | POA: Diagnosis not present

## 2023-11-07 DIAGNOSIS — I110 Hypertensive heart disease with heart failure: Secondary | ICD-10-CM | POA: Diagnosis not present

## 2023-11-07 DIAGNOSIS — I7121 Aneurysm of the ascending aorta, without rupture: Secondary | ICD-10-CM | POA: Diagnosis not present

## 2023-11-07 NOTE — Progress Notes (Signed)
 Complex Care Management Care Guide Note  11/07/2023 Name: Larry Elliott MRN: [id number] DOB: 01/02/1946  Larry Elliott is a 78 y.o. year old male who is a primary care patient of Amon Aloysius BRAVO, MD and is actively engaged with the care management team. I reached out to Elsie JINNY Elbe by phone today to assist with re-scheduling  with the Pharmacist.  Follow up plan:Patients daughter states that patient will be going into an assisted living facility and does not wish to reschedule with Pharmacist at this time.  Jeoffrey Buffalo , RMA     [facility]  [facility], [facility] Guide  Direct Dial: [phone]  Website: [email or web address]

## 2023-11-08 ENCOUNTER — Ambulatory Visit: Payer: Medicare Other | Admitting: Internal Medicine

## 2023-11-08 ENCOUNTER — Encounter: Payer: Self-pay | Admitting: Internal Medicine

## 2023-11-08 VITALS — BP 126/76 | HR 67 | Temp 98.1°F | Resp 20 | Ht 70.0 in | Wt 235.5 lb

## 2023-11-08 DIAGNOSIS — I5032 Chronic diastolic (congestive) heart failure: Secondary | ICD-10-CM

## 2023-11-08 DIAGNOSIS — I10 Essential (primary) hypertension: Secondary | ICD-10-CM | POA: Diagnosis not present

## 2023-11-08 DIAGNOSIS — R739 Hyperglycemia, unspecified: Secondary | ICD-10-CM

## 2023-11-08 DIAGNOSIS — R609 Edema, unspecified: Secondary | ICD-10-CM | POA: Diagnosis not present

## 2023-11-08 DIAGNOSIS — F03B0 Unspecified dementia, moderate, without behavioral disturbance, psychotic disturbance, mood disturbance, and anxiety: Secondary | ICD-10-CM | POA: Diagnosis not present

## 2023-11-08 DIAGNOSIS — Z91199 Patient's noncompliance with other medical treatment and regimen due to unspecified reason: Secondary | ICD-10-CM | POA: Diagnosis not present

## 2023-11-08 DIAGNOSIS — G3184 Mild cognitive impairment, so stated: Secondary | ICD-10-CM | POA: Diagnosis not present

## 2023-11-08 DIAGNOSIS — E782 Mixed hyperlipidemia: Secondary | ICD-10-CM

## 2023-11-08 LAB — HEMOGLOBIN A1C: Hgb A1c MFr Bld: 6.6 % — ABNORMAL HIGH (ref 4.6–6.5)

## 2023-11-08 MED ORDER — KETOCONAZOLE 2 % EX CREA: 1.0000 | TOPICAL_CREAM | Freq: Every day | CUTANEOUS | 0 refills | Status: DC

## 2023-11-08 NOTE — Patient Instructions (Addendum)
 Pick up the cream I sent to [facility], apply to your abdomen (belly) twice daily for the next few days.  For the leg swelling: Try to use the compression stockings or use a Ace wrap instead. For the next 3 days, take extra Demadex  20 mg in the morning.  Call if the swelling is not gradually better.    GO TO THE LAB : Get the blood work     Next visit with me in 3 months. Please schedule it at the front desk

## 2023-11-08 NOTE — Assessment & Plan Note (Addendum)
 Lower extremity edema, CHF: Although the leg swelling seems to be slightly worse, his weight has not gone up.  Last BMP okay except for minimally low potassium (on potassium supplements). Mild redness at the pretibial area -- not far from baseline, see picture from last visit. Plan: Again encouraged compression stockings or the use a Ace wrap if it is easier. Take a extra Demadex  in the mornings for 3 days. Dementia: On Aricept , wife reports that things are stable Cellulitis abdomen: Seen at the ER, Rx antibiotics and antifungals by mouth, improved, recommend Nizoral  cream.  Rx sent. Social: He is moving to a assisted living facility in the next few weeks. RTC 3 months

## 2023-11-08 NOTE — Progress Notes (Signed)
 Subjective:    Patient ID: Larry Elliott, male    DOB: [birth year], 78 y.o.   MRN: [id number]  DOS:  11/08/2023 Type of visit - description: Follow-up  Went to the ER 11/01/2023, had erythema at the right lower abdomen , dx  cellulitis. Was Rx doxycycline  and fluconazole  (5 tablets) Medication compliance is unclear  He did report that the redness is better. Denies fever or chills. Reports edema is at baseline.  Wt Readings from Last 3 Encounters:  11/08/23 235 lb 8 oz (106.8 kg)  10/15/23 242 lb 15.2 oz (110.2 kg)  10/09/23 239 lb (108.4 kg)    Review of Systems See above   Past Medical History:  Diagnosis Date   Allergy    penicillin & Aspirin   Anxiety 11/04/2013   Arthritis    BCC (basal cell carcinoma), leg    Right calf, L nape of neck at hairline   CAD (coronary artery disease)    CP, cath, non-obstructive   Cataract    been removed   Depression 11/04/2013   Diverticulosis    colon   Erectile dysfunction following radical prostatectomy    Hyperlipidemia    Hypertension    Insomnia    Melanoma (HCC) 2010   face, sees derm    Prostate CA (HCC) 07/2007   s/p robotic surgery   Spermatocele    Left    Past Surgical History:  Procedure Laterality Date   APPENDECTOMY     age 46   EYE SURGERY     cataracts removed   HERNIA REPAIR  2010   JOINT REPLACEMENT  hip 2024   KNEE ARTHROSCOPY Left 1965   meniscal tear   PROSTATECTOMY  07/25/2007    robotic,  for prostate cancer     SHOULDER ARTHROSCOPY Left 07/09/2013   Dr Deeann Fare    SPINE SURGERY     TOTAL HIP ARTHROPLASTY Left 12/27/2022   Procedure: LEFT TOTAL HIP ARTHROPLASTY ANTERIOR APPROACH;  Surgeon: Dayne Even, MD;  Location: [facility];  Service: Orthopedics;  Laterality: Left;   TRANSFORAMINAL LUMBAR INTERBODY FUSION (TLIF) WITH PEDICLE SCREW FIXATION 1 LEVEL Right 12/04/2019   Procedure: RIGHT-SIDED TRANSFORAMINAL LUMBAR INTERBODY FUSION LUMBAR FOUR THROUGH FIVE WITH INSTRUMENTATION AND  ALLOGRAFT;  Surgeon: Virl Grimes, MD;  Location: [facility];  Service: Orthopedics;  Laterality: Right;    Current Outpatient Medications  Medication Instructions   atorvastatin  (LIPITOR) 40 mg, Oral, Daily at bedtime   donepezil  (ARICEPT ) 5 mg, Oral, Daily at bedtime   doxycycline  (VIBRAMYCIN ) 100 mg, Oral, 2 times daily   empagliflozin  (JARDIANCE ) 10 mg, Oral, Daily   fenofibrate  micronized (LOFIBRA) 134 mg, Oral, Daily before breakfast   fluconazole  (DIFLUCAN ) 150 mg, Oral, every 72 hours   gabapentin  (NEURONTIN ) 100 mg, Oral, 3 times daily   ketoconazole  (NIZORAL ) 2 % cream 1 Application, Topical, Daily   melatonin 5 mg, Oral, Daily at bedtime   potassium chloride  SA (KLOR-CON  M) 20 MEQ tablet 20 mEq, Oral, Daily   sertraline  (ZOLOFT ) 50 mg, Oral, Daily   spironolactone  (ALDACTONE ) 25 mg, Oral, Daily   torsemide  (DEMADEX ) 20 mg, Oral, Daily       Objective:   Physical Exam BP 126/76   Pulse 67   Temp 98.1 F (36.7 C) (Oral)   Resp 20   Ht 5\' 10"  (1.778 m)   Wt 235 lb 8 oz (106.8 kg)   SpO2 94%   BMI 33.79 kg/m  General:   Well developed, NAD, BMI noted. HEENT:  Normocephalic . Face symmetric, atraumatic Lungs:  CTA B Normal respiratory effort, no intercostal retractions, no accessory muscle use. Heart: RRR,  no murmur.  Lower extremities: no pretibial edema bilaterally.  See picture Skin: At the folds of the right lower abdomen, there is some postinflammatory hyperpigmentation without erythema or warmness. Neurologic:  alert, cooperative.  Speech normal, gait assisted by a cane. Psych--   Behavior appropriate. No anxious or depressed appearing.      Assessment   Assessment  Hyperglycemia   HTN Hyperlipidemia Anxiety depression insomnia   dc clonazepam  - not effective,  8-16, rx trazodone . Metabolic encephalopathy admitted July 2023: No more Ambien   MCI (MMSE 04-2023---26, despite high MCI he is not functional.  Started Aricept  04-2023) CV: --CAD,  non-obstructive ---LE edema R>L (-) US  DVT 2020 ---ABI 09/2019 WNL -- Diastolic CHF suspected, echo 02/2023 normal Prostate cancer-- 2008, robotic surgery, no incontinence, Dr Rozanne Corners: now f/u per PCP as of 05-2018 [facility] ED past surgery  spermatocele, left Skin --Melanoma 2010, BCCs  does not see derm regularly as off 01-2022   PLAN Lower extremity edema, CHF: Although the leg swelling seems to be slightly worse, his weight has not gone up.  Last BMP okay except for minimally low potassium (on potassium supplements). Mild redness at the pretibial area -- not far from baseline, see picture from last visit. Plan: Again encouraged compression stockings or the use a Ace wrap if it is easier. Take a extra Demadex  in the mornings for 3 days. Dementia: On Aricept , wife reports that things are stable Cellulitis abdomen: Seen at the ER, Rx antibiotics and antifungals by mouth, improved, recommend Nizoral  cream.  Rx sent. Social: He is moving to a assisted living facility in the next few weeks. RTC 3 months

## 2023-11-11 ENCOUNTER — Other Ambulatory Visit: Payer: Self-pay | Admitting: Internal Medicine

## 2023-11-12 ENCOUNTER — Encounter: Payer: Self-pay | Admitting: Internal Medicine

## 2023-11-13 MED ORDER — KETOCONAZOLE 2 % EX CREA: 1.0000 | TOPICAL_CREAM | Freq: Every day | CUTANEOUS | 0 refills | Status: DC

## 2023-11-13 MED ORDER — TORSEMIDE 20 MG PO TABS: 20.0000 mg | ORAL_TABLET | Freq: Every day | ORAL | 0 refills | Status: DC

## 2023-11-14 DIAGNOSIS — E781 Pure hyperglyceridemia: Secondary | ICD-10-CM | POA: Diagnosis not present

## 2023-11-14 DIAGNOSIS — I5033 Acute on chronic diastolic (congestive) heart failure: Secondary | ICD-10-CM | POA: Diagnosis not present

## 2023-11-14 DIAGNOSIS — F32A Depression, unspecified: Secondary | ICD-10-CM | POA: Diagnosis not present

## 2023-11-14 DIAGNOSIS — J9601 Acute respiratory failure with hypoxia: Secondary | ICD-10-CM | POA: Diagnosis not present

## 2023-11-14 DIAGNOSIS — I110 Hypertensive heart disease with heart failure: Secondary | ICD-10-CM | POA: Diagnosis not present

## 2023-11-14 DIAGNOSIS — I7121 Aneurysm of the ascending aorta, without rupture: Secondary | ICD-10-CM | POA: Diagnosis not present

## 2023-11-16 ENCOUNTER — Telehealth: Payer: Self-pay

## 2023-11-16 DIAGNOSIS — M25551 Pain in right hip: Secondary | ICD-10-CM | POA: Diagnosis not present

## 2023-11-16 DIAGNOSIS — Z7984 Long term (current) use of oral hypoglycemic drugs: Secondary | ICD-10-CM | POA: Diagnosis not present

## 2023-11-16 DIAGNOSIS — G8929 Other chronic pain: Secondary | ICD-10-CM | POA: Diagnosis not present

## 2023-11-16 DIAGNOSIS — Z5982 Transportation insecurity: Secondary | ICD-10-CM | POA: Diagnosis not present

## 2023-11-16 DIAGNOSIS — E669 Obesity, unspecified: Secondary | ICD-10-CM | POA: Diagnosis not present

## 2023-11-16 DIAGNOSIS — Z6834 Body mass index (BMI) 34.0-34.9, adult: Secondary | ICD-10-CM | POA: Diagnosis not present

## 2023-11-16 DIAGNOSIS — J9601 Acute respiratory failure with hypoxia: Secondary | ICD-10-CM | POA: Diagnosis not present

## 2023-11-16 DIAGNOSIS — I7121 Aneurysm of the ascending aorta, without rupture: Secondary | ICD-10-CM | POA: Diagnosis not present

## 2023-11-16 DIAGNOSIS — I5033 Acute on chronic diastolic (congestive) heart failure: Secondary | ICD-10-CM | POA: Diagnosis not present

## 2023-11-16 DIAGNOSIS — F419 Anxiety disorder, unspecified: Secondary | ICD-10-CM | POA: Diagnosis not present

## 2023-11-16 DIAGNOSIS — E781 Pure hyperglyceridemia: Secondary | ICD-10-CM | POA: Diagnosis not present

## 2023-11-16 DIAGNOSIS — F32A Depression, unspecified: Secondary | ICD-10-CM | POA: Diagnosis not present

## 2023-11-16 DIAGNOSIS — Z85828 Personal history of other malignant neoplasm of skin: Secondary | ICD-10-CM | POA: Diagnosis not present

## 2023-11-16 DIAGNOSIS — I2510 Atherosclerotic heart disease of native coronary artery without angina pectoris: Secondary | ICD-10-CM | POA: Diagnosis not present

## 2023-11-16 DIAGNOSIS — I110 Hypertensive heart disease with heart failure: Secondary | ICD-10-CM | POA: Diagnosis not present

## 2023-11-16 DIAGNOSIS — G4700 Insomnia, unspecified: Secondary | ICD-10-CM | POA: Diagnosis not present

## 2023-11-16 DIAGNOSIS — Z556 Problems related to health literacy: Secondary | ICD-10-CM | POA: Diagnosis not present

## 2023-11-16 DIAGNOSIS — Z8546 Personal history of malignant neoplasm of prostate: Secondary | ICD-10-CM | POA: Diagnosis not present

## 2023-11-16 DIAGNOSIS — E876 Hypokalemia: Secondary | ICD-10-CM | POA: Diagnosis not present

## 2023-11-16 MED ORDER — KETOCONAZOLE 2 % EX CREA: 1.0000 | TOPICAL_CREAM | Freq: Every day | CUTANEOUS | 0 refills | Status: DC

## 2023-11-16 NOTE — Telephone Encounter (Signed)
Copied from CRM [id number]. Topic: Clinical - Prescription Issue >> Nov 16, 2023  2:03 PM Elizebeth Brooking wrote: Reason for CRM: Patient called in regarding prescription ketoconazole (NIZORAL) 2 % cream, patient wanted to know what pharmacy it was sent to let patient know it was  sent to [id number] - [place], [place] - [address] - Pharmacy stated they have not received it

## 2023-11-16 NOTE — Telephone Encounter (Signed)
Rx resent to [facility].

## 2023-11-21 ENCOUNTER — Emergency Department (HOSPITAL_COMMUNITY): Payer: Medicare Other

## 2023-11-21 ENCOUNTER — Inpatient Hospital Stay (HOSPITAL_COMMUNITY)
Admission: EM | Admit: 2023-11-21 | Discharge: 2023-11-25 | DRG: 291 | Disposition: A | Discharge status: Home / Self Care | Payer: Medicare Other | Attending: Internal Medicine | Admitting: Internal Medicine

## 2023-11-21 ENCOUNTER — Telehealth: Payer: Self-pay

## 2023-11-21 ENCOUNTER — Other Ambulatory Visit: Payer: Self-pay

## 2023-11-21 DIAGNOSIS — Z6833 Body mass index (BMI) 33.0-33.9, adult: Secondary | ICD-10-CM | POA: Diagnosis not present

## 2023-11-21 DIAGNOSIS — I509 Heart failure, unspecified: Secondary | ICD-10-CM | POA: Diagnosis not present

## 2023-11-21 DIAGNOSIS — Z885 Allergy status to narcotic agent status: Secondary | ICD-10-CM

## 2023-11-21 DIAGNOSIS — Z91148 Patient's other noncompliance with medication regimen for other reason: Secondary | ICD-10-CM

## 2023-11-21 DIAGNOSIS — E785 Hyperlipidemia, unspecified: Secondary | ICD-10-CM | POA: Diagnosis present

## 2023-11-21 DIAGNOSIS — E1165 Type 2 diabetes mellitus with hyperglycemia: Secondary | ICD-10-CM | POA: Diagnosis not present

## 2023-11-21 DIAGNOSIS — E669 Obesity, unspecified: Secondary | ICD-10-CM | POA: Diagnosis present

## 2023-11-21 DIAGNOSIS — Z66 Do not resuscitate: Secondary | ICD-10-CM | POA: Diagnosis present

## 2023-11-21 DIAGNOSIS — E876 Hypokalemia: Secondary | ICD-10-CM | POA: Diagnosis not present

## 2023-11-21 DIAGNOSIS — I959 Hypotension, unspecified: Secondary | ICD-10-CM | POA: Diagnosis not present

## 2023-11-21 DIAGNOSIS — Z8249 Family history of ischemic heart disease and other diseases of the circulatory system: Secondary | ICD-10-CM | POA: Diagnosis not present

## 2023-11-21 DIAGNOSIS — Z823 Family history of stroke: Secondary | ICD-10-CM

## 2023-11-21 DIAGNOSIS — R0602 Shortness of breath: Secondary | ICD-10-CM | POA: Diagnosis not present

## 2023-11-21 DIAGNOSIS — J9601 Acute respiratory failure with hypoxia: Principal | ICD-10-CM

## 2023-11-21 DIAGNOSIS — E1140 Type 2 diabetes mellitus with diabetic neuropathy, unspecified: Secondary | ICD-10-CM | POA: Diagnosis present

## 2023-11-21 DIAGNOSIS — Z85828 Personal history of other malignant neoplasm of skin: Secondary | ICD-10-CM

## 2023-11-21 DIAGNOSIS — Z79899 Other long term (current) drug therapy: Secondary | ICD-10-CM

## 2023-11-21 DIAGNOSIS — F32A Depression, unspecified: Secondary | ICD-10-CM | POA: Diagnosis not present

## 2023-11-21 DIAGNOSIS — Z96642 Presence of left artificial hip joint: Secondary | ICD-10-CM | POA: Diagnosis present

## 2023-11-21 DIAGNOSIS — Z7984 Long term (current) use of oral hypoglycemic drugs: Secondary | ICD-10-CM

## 2023-11-21 DIAGNOSIS — M1990 Unspecified osteoarthritis, unspecified site: Secondary | ICD-10-CM | POA: Diagnosis present

## 2023-11-21 DIAGNOSIS — F419 Anxiety disorder, unspecified: Secondary | ICD-10-CM | POA: Diagnosis not present

## 2023-11-21 DIAGNOSIS — Z8546 Personal history of malignant neoplasm of prostate: Secondary | ICD-10-CM

## 2023-11-21 DIAGNOSIS — Z9079 Acquired absence of other genital organ(s): Secondary | ICD-10-CM | POA: Diagnosis not present

## 2023-11-21 DIAGNOSIS — E66811 Obesity, class 1: Secondary | ICD-10-CM | POA: Diagnosis present

## 2023-11-21 DIAGNOSIS — Z886 Allergy status to analgesic agent status: Secondary | ICD-10-CM

## 2023-11-21 DIAGNOSIS — I5033 Acute on chronic diastolic (congestive) heart failure: Secondary | ICD-10-CM | POA: Diagnosis present

## 2023-11-21 DIAGNOSIS — R4189 Other symptoms and signs involving cognitive functions and awareness: Secondary | ICD-10-CM | POA: Diagnosis present

## 2023-11-21 DIAGNOSIS — I2510 Atherosclerotic heart disease of native coronary artery without angina pectoris: Secondary | ICD-10-CM | POA: Diagnosis present

## 2023-11-21 DIAGNOSIS — Z880 Allergy status to penicillin: Secondary | ICD-10-CM

## 2023-11-21 DIAGNOSIS — Z85820 Personal history of malignant melanoma of skin: Secondary | ICD-10-CM

## 2023-11-21 DIAGNOSIS — Z888 Allergy status to other drugs, medicaments and biological substances status: Secondary | ICD-10-CM

## 2023-11-21 DIAGNOSIS — R609 Edema, unspecified: Secondary | ICD-10-CM | POA: Diagnosis not present

## 2023-11-21 DIAGNOSIS — I110 Hypertensive heart disease with heart failure: Secondary | ICD-10-CM | POA: Diagnosis not present

## 2023-11-21 DIAGNOSIS — J9811 Atelectasis: Secondary | ICD-10-CM | POA: Diagnosis present

## 2023-11-21 DIAGNOSIS — I10 Essential (primary) hypertension: Secondary | ICD-10-CM | POA: Diagnosis not present

## 2023-11-21 LAB — BASIC METABOLIC PANEL
Anion gap: 9 (ref 5–15)
BUN: 9 mg/dL (ref 8–23)
CO2: 24 mmol/L (ref 22–32)
Calcium: 9.2 mg/dL (ref 8.9–10.3)
Chloride: 107 mmol/L (ref 98–111)
Creatinine, Ser: 0.86 mg/dL (ref 0.61–1.24)
GFR, Estimated: >60 mL/min (ref >60)
Glucose, Bld: 99 mg/dL (ref 70–99)
Potassium: 3.4 mmol/L — ABNORMAL LOW (ref 3.5–5.1)
Sodium: 140 mmol/L (ref 135–145)

## 2023-11-21 LAB — CBC WITH DIFFERENTIAL/PLATELET
Abs Immature Granulocytes: 0.14 10*3/uL — ABNORMAL HIGH (ref 0.00–0.07)
Basophils Absolute: 0.1 10*3/uL (ref 0.0–0.1)
Basophils Relative: 1 %
Eosinophils Absolute: 0.2 10*3/uL (ref 0.0–0.5)
Eosinophils Relative: 2 %
HCT: 49.4 % (ref 39.0–52.0)
Hemoglobin: 15.3 g/dL (ref 13.0–17.0)
Immature Granulocytes: 2 %
Lymphocytes Relative: 13 %
Lymphs Abs: 1.3 10*3/uL (ref 0.7–4.0)
MCH: 28.8 pg (ref 26.0–34.0)
MCHC: 31 g/dL (ref 30.0–36.0)
MCV: 93 fL (ref 80.0–100.0)
Monocytes Absolute: 0.7 10*3/uL (ref 0.1–1.0)
Monocytes Relative: 7 %
Neutro Abs: 7.2 10*3/uL (ref 1.7–7.7)
Neutrophils Relative %: 75 %
Platelets: 326 10*3/uL (ref 150–400)
RBC: 5.31 MIL/uL (ref 4.22–5.81)
RDW: 15.9 % — ABNORMAL HIGH (ref 11.5–15.5)
WBC: 9.5 10*3/uL (ref 4.0–10.5)
nRBC: 0 % (ref 0.0–0.2)

## 2023-11-21 LAB — CBC
HCT: 45.4 % (ref 39.0–52.0)
Hemoglobin: 14.6 g/dL (ref 13.0–17.0)
MCH: 28.1 pg (ref 26.0–34.0)
MCHC: 32.2 g/dL (ref 30.0–36.0)
MCV: 87.5 fL (ref 80.0–100.0)
Platelets: 377 10*3/uL (ref 150–400)
RBC: 5.19 MIL/uL (ref 4.22–5.81)
RDW: 15.6 % — ABNORMAL HIGH (ref 11.5–15.5)
WBC: 9.6 10*3/uL (ref 4.0–10.5)
nRBC: 0 % (ref 0.0–0.2)

## 2023-11-21 LAB — BRAIN NATRIURETIC PEPTIDE: B Natriuretic Peptide: 345 pg/mL — ABNORMAL HIGH (ref 0.0–100.0)

## 2023-11-21 LAB — TROPONIN I (HIGH SENSITIVITY): Troponin I (High Sensitivity): 5 ng/L (ref <18)

## 2023-11-21 MED ORDER — MELATONIN 5 MG PO TABS: 5.0000 mg | ORAL_TABLET | Freq: Every evening | ORAL | Status: DC | PRN

## 2023-11-21 MED ORDER — POLYETHYLENE GLYCOL 3350 17 G PO PACK: 17.0000 g | PACK | Freq: Every day | ORAL | Status: DC | PRN

## 2023-11-21 MED ORDER — POTASSIUM CHLORIDE CRYS ER 20 MEQ PO TBCR
20.0000 meq | EXTENDED_RELEASE_TABLET | Freq: Once | ORAL | Status: AC
Start: 2023-11-21 — End: 2023-11-21
  Administered 2023-11-21: 20 meq via ORAL
  Filled 2023-11-21: qty 1

## 2023-11-21 MED ORDER — POTASSIUM CHLORIDE CRYS ER 20 MEQ PO TBCR
20.0000 meq | EXTENDED_RELEASE_TABLET | Freq: Two times a day (BID) | ORAL | Status: AC
  Administered 2023-11-21 – 2023-11-22 (×2): 20 meq via ORAL
  Filled 2023-11-21 (×2): qty 1

## 2023-11-21 MED ORDER — FUROSEMIDE 10 MG/ML IJ SOLN
20.0000 mg | Freq: Two times a day (BID) | INTRAMUSCULAR | Status: AC
  Administered 2023-11-21 – 2023-11-22 (×2): 20 mg via INTRAVENOUS
  Filled 2023-11-21 (×2): qty 2

## 2023-11-21 MED ORDER — ACETAMINOPHEN 325 MG PO TABS
650.0000 mg | ORAL_TABLET | Freq: Four times a day (QID) | ORAL | Status: DC | PRN
  Administered 2023-11-24: 650 mg via ORAL
  Filled 2023-11-21 (×2): qty 2

## 2023-11-21 MED ORDER — ENOXAPARIN SODIUM 60 MG/0.6ML IJ SOSY
50.0000 mg | PREFILLED_SYRINGE | INTRAMUSCULAR | Status: DC
Start: 2023-11-21 — End: 2023-11-25
  Administered 2023-11-21 – 2023-11-24 (×4): 50 mg via SUBCUTANEOUS
  Filled 2023-11-21 (×4): qty 0.6

## 2023-11-21 MED ORDER — FUROSEMIDE 10 MG/ML IJ SOLN
20.0000 mg | Freq: Once | INTRAMUSCULAR | Status: AC
  Administered 2023-11-21: 20 mg via INTRAVENOUS
  Filled 2023-11-21: qty 2

## 2023-11-21 MED ORDER — PROCHLORPERAZINE EDISYLATE 10 MG/2ML IJ SOLN: 5.0000 mg | Freq: Four times a day (QID) | INTRAMUSCULAR | Status: DC | PRN

## 2023-11-21 NOTE — ED Provider Notes (Signed)
[facility] AT [facility] Provider Note   CSN: [id number] Arrival date & time: 11/21/23  1421     History  Chief Complaint  Patient presents with   Leg Swelling    Larry Elliott is a 79 y.o. male history of prostate cancer, CHF, hypertensive emergency presented for bilateral leg swelling and dyspnea on exertion.  Patient is not taking his Lasix over the past few days states that his legs have become more more swollen.  Patient denies any chest pain but states that he is short of breath when he exerts himself.  Patient denies productive cough or fevers.  Home Medications Prior to Admission medications   Medication Sig Start Date End Date Taking? Authorizing Provider  atorvastatin (LIPITOR) 40 MG tablet Take 1 tablet (40 mg total) by mouth at bedtime. 09/25/23   Wanda Plump, MD  donepezil (ARICEPT) 5 MG tablet Take 1 tablet (5 mg total) by mouth at bedtime. 09/25/23   Wanda Plump, MD  doxycycline (VIBRAMYCIN) 100 MG capsule Take 1 capsule (100 mg total) by mouth 2 (two) times daily. 11/01/23   Wallis Bamberg, PA-C  empagliflozin (JARDIANCE) 10 MG TABS tablet Take 1 tablet (10 mg total) by mouth daily. 09/25/23   Wanda Plump, MD  fenofibrate micronized (LOFIBRA) 134 MG capsule Take 1 capsule (134 mg total) by mouth daily before breakfast. 09/25/23   Wanda Plump, MD  fluconazole (DIFLUCAN) 150 MG tablet Take 1 tablet (150 mg total) by mouth every 3 (three) days. 11/01/23   Wallis Bamberg, PA-C  gabapentin (NEURONTIN) 100 MG capsule Take 1 capsule (100 mg total) by mouth 3 (three) times daily. 10/02/23   Wanda Plump, MD  ketoconazole (NIZORAL) 2 % cream Apply 1 Application topically daily. 11/16/23   Wanda Plump, MD  melatonin 5 MG TABS Take 1 tablet (5 mg total) by mouth at bedtime. 01/02/23   Rolly Salter, MD  potassium chloride SA (KLOR-CON M) 20 MEQ tablet Take 1 tablet (20 mEq total) by mouth daily. 10/02/23   Wanda Plump, MD  sertraline (ZOLOFT) 50 MG tablet Take 1  tablet (50 mg total) by mouth daily. 09/25/23   Wanda Plump, MD  spironolactone (ALDACTONE) 25 MG tablet Take 1 tablet (25 mg total) by mouth daily. 09/25/23   Wanda Plump, MD  torsemide (DEMADEX) 20 MG tablet Take 1 tablet (20 mg total) by mouth daily. 11/13/23 12/13/23  Wanda Plump, MD      Allergies    Ambien [zolpidem], Aspirin, Hydrocodone, and Penicillins    Review of Systems   Review of Systems  Physical Exam Updated Vital Signs BP (!) 150/82   Pulse 63   Temp 98.3 F (36.8 C)   Resp (!) 21   Wt 106 kg   SpO2 100%   BMI 33.53 kg/m  Physical Exam Constitutional:      General: He is in acute distress.  Cardiovascular:     Rate and Rhythm: Normal rate and regular rhythm.     Pulses: Normal pulses.     Heart sounds: Normal heart sounds.  Pulmonary:     Effort: No respiratory distress.     Breath sounds: Normal breath sounds.     Comments: Able to speak in full sentences Increased work of breathing Musculoskeletal:     Comments: 2+ bilateral pitting edema  Skin:    General: Skin is warm and dry.  Neurological:     Mental Status:  He is alert.  Psychiatric:        Mood and Affect: Mood normal.     ED Results / Procedures / Treatments   Labs (all labs ordered are listed, but only abnormal results are displayed) Labs Reviewed  CBC WITH DIFFERENTIAL/PLATELET - Abnormal; Notable for the following components:      Result Value   RDW 15.9 (*)    Abs Immature Granulocytes 0.14 (*)    All other components within normal limits  BRAIN NATRIURETIC PEPTIDE - Abnormal; Notable for the following components:   B Natriuretic Peptide 345.0 (*)    All other components within normal limits  BASIC METABOLIC PANEL - Abnormal; Notable for the following components:   Potassium 3.4 (*)    All other components within normal limits  TROPONIN I (HIGH SENSITIVITY)  TROPONIN I (HIGH SENSITIVITY)    EKG EKG Interpretation Date/Time:  Tuesday November 21 2023 14:26:09 EST Ventricular  Rate:  81 PR Interval:  166 QRS Duration:  72 QT Interval:  384 QTC Calculation: 446 R Axis:   20  Text Interpretation: Normal sinus rhythm Normal ECG When compared with ECG of 12-Oct-2023 18:15, PREVIOUS ECG IS PRESENT Confirmed by Estanislado Pandy [phone]) on 11/21/2023 6:56:13 PM  Radiology DG Chest Port 1 View Result Date: 11/21/2023 CLINICAL DATA:  Shortness of breath. EXAM: PORTABLE CHEST 1 VIEW COMPARISON:  October 12, 2023. FINDINGS: Stable cardiomediastinal silhouette. Minimal bibasilar subsegmental atelectasis is noted. Bony thorax is unremarkable. IMPRESSION: Minimal bibasilar subsegmental atelectasis. Electronically Signed   By: Lupita Raider M.D.   On: 11/21/2023 16:03    Procedures .Critical Care  Performed by: Netta Corrigan, PA-C Authorized by: Netta Corrigan, PA-C   Critical care provider statement:    Critical care time (minutes):  30   Critical care time was exclusive of:  Separately billable procedures and treating other patients   Critical care was necessary to treat or prevent imminent or life-threatening deterioration of the following conditions:  Respiratory failure   Critical care was time spent personally by me on the following activities:  Blood draw for specimens, development of treatment plan with patient or surrogate, discussions with consultants, evaluation of patient's response to treatment, examination of patient, obtaining history from patient or surrogate, review of old charts, re-evaluation of patient's condition, pulse oximetry, ordering and review of radiographic studies, ordering and review of laboratory studies and ordering and performing treatments and interventions   I assumed direction of critical care for this patient from another provider in my specialty: no     Care discussed with: admitting provider       Medications Ordered in ED Medications  furosemide (LASIX) injection 20 mg (20 mg Intravenous Given 11/21/23 1722)  potassium chloride  SA (KLOR-CON M) CR tablet 20 mEq (20 mEq Oral Given 11/21/23 1718)    ED Course/ Medical Decision Making/ A&P                                 Medical Decision Making Risk Decision regarding hospitalization.   Larry Elliott 78 y.o. presented today for shortness of breath.  Working DDx that I considered at this time includes, but not limited to, asthma/COPD exacerbation, URI, viral illness, anemia, ACS, PE, pneumonia, pleural effusion, lung cancer, CHF, respiratory distress, medication side effect, intoxication.  R/o DDx: asthma/COPD exacerbation, URI, viral illness, anemia, ACS, PE, pneumonia, pleural effusion, lung cancer, respiratory distress, medication side effect,  intoxication: These are considered less likely due to history of present illness, physical exam, labs/imaging findings  Review of prior external notes: 11/01/2023 ED  Unique Tests and My Independent Interpretation:  CBC: Unremarkable BMP: Unremarkable EKG: Sinus 81 bpm, no ST elevations or depressions noted Troponin: 5 CXR: No acute findings BNP: 345  Social Determinants of Health: none  Discussion with Independent Historian: None  Discussion of Management of Tests:  Margo Aye, MD hospitalist  Risk: High: hospitalization or escalation of hospital-level care  Risk Stratification Score: None  Staffed with Young, DO  Plan: On exam patient was in acute distress with stable vitals. Physical exam showed 2+ bilateral pitting edema. The cardiac monitor was ordered secondary to the patient's history of shortness of breath and to monitor the patient for dysrhythmia. Cardiac monitor by my independent interpretation showed sinus rhythm.  Patient exam is consistent with fluid overload.  In the setting of not being on take his Lasix for the past few days do feel patient will need to be admitted for diuresis as he is not requiring oxygen.  Patient has already been given Lasix.  Hospitalist was consulted and patient was accepted for  admission.  This chart was dictated using voice recognition software.  Despite best efforts to proofread,  errors can occur which can change the documentation meaning.         Final Clinical Impression(s) / ED Diagnoses Final diagnoses:  Acute respiratory failure with hypoxia (HCC)  Acute on chronic congestive heart failure, unspecified heart failure type [facility])    Rx / DC Orders ED Discharge Orders     None         Larry Elliott 11/21/23 1928    Coral Spikes, DO 11/21/23 2232

## 2023-11-21 NOTE — H&P (Signed)
History and Physical  Larry Elliott:[id number] DOB: 1946-10-12 DOA: 11/21/2023  Referring physician: Rosezella Florida PCP: Wanda Plump, MD  Outpatient Specialists: Cardiology Patient coming from: Assisted living facility  Chief Complaint: Shortness of breath.  HPI: Larry Elliott is a 78 y.o. male with medical history significant for chronic HFpEF, hyperlipidemia, polyneuropathy, chronic anxiety/depression, who presented to the ER via EMS due to progressive shortness of breath for the past 2 days.  Associated with abdominal distention and bilateral lower extremity edema.  No reported chest pain.  The patient forgot to take his home diuretics for the past 2 days.  EMS was activated.  Upon arrival the patient was tachypneic and was placed on 2 L nasal cannula for comfort.  Brought into the ED for further evaluation.  In the ER, volume overload on exam.  Lab studies notable for elevated BNP greater than 300.  High-sensitivity troponin was negative at 5.  The patient received a dose of IV Lasix 20 mg x 1 and potassium replacement for serum potassium of 3.4.  TRH, hospitalist service, was asked to admit for further diuresing due to acute on chronic HFpEF.  ED Course: Temperature 98.3.  BP 159/79, pulse 79, respiratory 24, saturation 94% on room air.  Review of Systems: Review of systems as noted in the HPI. All other systems reviewed and are negative.   Past Medical History:  Diagnosis Date   Allergy    penicillin & Aspirin   Anxiety 11/04/2013   Arthritis    BCC (basal cell carcinoma), leg    Right calf, L nape of neck at hairline   CAD (coronary artery disease)    CP, cath, non-obstructive   Cataract    been removed   Depression 11/04/2013   Diverticulosis    colon   Erectile dysfunction following radical prostatectomy    Hyperlipidemia    Hypertension    Insomnia    Melanoma (HCC) 2010   face, sees derm    Prostate CA (HCC) 07/2007   s/p robotic surgery   Spermatocele     Left   Past Surgical History:  Procedure Laterality Date   APPENDECTOMY     age 58   EYE SURGERY     cataracts removed   HERNIA REPAIR  2010   JOINT REPLACEMENT  hip 2024   KNEE ARTHROSCOPY Left 1965   meniscal tear   PROSTATECTOMY  07/25/2007    robotic,  for prostate cancer     SHOULDER ARTHROSCOPY Left 07/09/2013   Dr Ave Filter    SPINE SURGERY     TOTAL HIP ARTHROPLASTY Left 12/27/2022   Procedure: LEFT TOTAL HIP ARTHROPLASTY ANTERIOR APPROACH;  Surgeon: Marcene Corning, MD;  Location: [facility];  Service: Orthopedics;  Laterality: Left;   TRANSFORAMINAL LUMBAR INTERBODY FUSION (TLIF) WITH PEDICLE SCREW FIXATION 1 LEVEL Right 12/04/2019   Procedure: RIGHT-SIDED TRANSFORAMINAL LUMBAR INTERBODY FUSION LUMBAR FOUR THROUGH FIVE WITH INSTRUMENTATION AND ALLOGRAFT;  Surgeon: Estill Bamberg, MD;  Location: [facility];  Service: Orthopedics;  Laterality: Right;    Social History:  reports that he has never smoked. He has never used smokeless tobacco. He reports that he does not currently use alcohol after a past usage of about 1.0 standard drink of alcohol per week. He reports that he does not use drugs.   Allergies  Allergen Reactions   Ambien [Zolpidem] Other (See Comments)    Encephalopathy, admitted 04-2022, symptoms suspected to be from [place]   Aspirin Swelling    swelling in  face   Hydrocodone Other (See Comments)    Insomnia, mild SOB (w/o cough-wheezing)   Penicillins Swelling    swelling in face    Family History  Problem Relation Age of Onset   Coronary artery disease Father 19       dx age 53s   Stroke Mother    Hypertension Neg Hx    Diabetes Neg Hx    Colon cancer Neg Hx    Prostate cancer Neg Hx       Prior to Admission medications   Medication Sig Start Date End Date Taking? Authorizing Provider  atorvastatin (LIPITOR) 40 MG tablet Take 1 tablet (40 mg total) by mouth at bedtime. 09/25/23   Wanda Plump, MD  donepezil (ARICEPT) 5 MG tablet Take 1 tablet (5 mg  total) by mouth at bedtime. 09/25/23   Wanda Plump, MD  doxycycline (VIBRAMYCIN) 100 MG capsule Take 1 capsule (100 mg total) by mouth 2 (two) times daily. 11/01/23   Wallis Bamberg, PA-C  empagliflozin (JARDIANCE) 10 MG TABS tablet Take 1 tablet (10 mg total) by mouth daily. 09/25/23   Wanda Plump, MD  fenofibrate micronized (LOFIBRA) 134 MG capsule Take 1 capsule (134 mg total) by mouth daily before breakfast. 09/25/23   Wanda Plump, MD  fluconazole (DIFLUCAN) 150 MG tablet Take 1 tablet (150 mg total) by mouth every 3 (three) days. 11/01/23   Wallis Bamberg, PA-C  gabapentin (NEURONTIN) 100 MG capsule Take 1 capsule (100 mg total) by mouth 3 (three) times daily. 10/02/23   Wanda Plump, MD  ketoconazole (NIZORAL) 2 % cream Apply 1 Application topically daily. 11/16/23   Wanda Plump, MD  melatonin 5 MG TABS Take 1 tablet (5 mg total) by mouth at bedtime. 01/02/23   Rolly Salter, MD  potassium chloride SA (KLOR-CON M) 20 MEQ tablet Take 1 tablet (20 mEq total) by mouth daily. 10/02/23   Wanda Plump, MD  sertraline (ZOLOFT) 50 MG tablet Take 1 tablet (50 mg total) by mouth daily. 09/25/23   Wanda Plump, MD  spironolactone (ALDACTONE) 25 MG tablet Take 1 tablet (25 mg total) by mouth daily. 09/25/23   Wanda Plump, MD  torsemide (DEMADEX) 20 MG tablet Take 1 tablet (20 mg total) by mouth daily. 11/13/23 12/13/23  Wanda Plump, MD    Physical Exam: BP (!) 150/82   Pulse 63   Temp 98.3 F (36.8 C)   Resp (!) 21   Wt 106 kg   SpO2 100%   BMI 33.53 kg/m   General: 78 y.o. year-old male well developed well nourished in no acute distress.  Alert and oriented x3. Cardiovascular: Regular rate and rhythm with no rubs or gallops.  No thyromegaly or JVD noted.  No lower extremity edema. 2/4 pulses in all 4 extremities. Respiratory: Clear to auscultation with no wheezes or rales. Good inspiratory effort. Abdomen: Soft nontender nondistended with normal bowel sounds x4 quadrants. Muskuloskeletal: No cyanosis, clubbing or  edema noted bilaterally Neuro: CN II-XII intact, strength, sensation, reflexes Skin: No ulcerative lesions noted or rashes Psychiatry: Judgement and insight appear normal. Mood is appropriate for condition and setting          Labs on Admission:  Basic Metabolic Panel: Recent Labs  Lab 11/21/23 1758  NA 140  K 3.4*  CL 107  CO2 24  GLUCOSE 99  BUN 9  CREATININE 0.86  CALCIUM 9.2   Liver Function Tests: No results for input(s): "  AST", "ALT", "ALKPHOS", "BILITOT", "PROT", "ALBUMIN" in the last 168 hours. No results for input(s): "LIPASE", "AMYLASE" in the last 168 hours. No results for input(s): "AMMONIA" in the last 168 hours. CBC: Recent Labs  Lab 11/21/23 1715  WBC 9.5  NEUTROABS 7.2  HGB 15.3  HCT 49.4  MCV 93.0  PLT 326   Cardiac Enzymes: No results for input(s): "CKTOTAL", "CKMB", "CKMBINDEX", "TROPONINI" in the last 168 hours.  BNP (last 3 results) Recent Labs    08/17/23 1012 10/12/23 1935 11/21/23 1715  BNP 14.6 40.1 345.0*    ProBNP (last 3 results) Recent Labs    01/09/23 1436 07/25/23 1337  PROBNP 14.0 12.0    CBG: No results for input(s): "GLUCAP" in the last 168 hours.  Radiological Exams on Admission: DG Chest Port 1 View Result Date: 11/21/2023 CLINICAL DATA:  Shortness of breath. EXAM: PORTABLE CHEST 1 VIEW COMPARISON:  October 12, 2023. FINDINGS: Stable cardiomediastinal silhouette. Minimal bibasilar subsegmental atelectasis is noted. Bony thorax is unremarkable. IMPRESSION: Minimal bibasilar subsegmental atelectasis. Electronically Signed   By: Lupita Raider M.D.   On: 11/21/2023 16:03    EKG: I independently viewed the EKG done and my findings are as followed: Normal sinus rhythm rate of 81.  Nonspecific ST-T changes.  QTc 446.  Assessment/Plan Present on Admission: **None**  Principal Problem:   Acute on chronic congestive heart failure (HCC)  Acute on chronic HFpEF likely secondary to noncompliance with home  diuresis Forgot to take his diuretics for the past 2 days when he started developing symptoms Presented with volume overload on physical exam and elevated BNP greater than 340 Last 2D echo done on 08/18/2023 revealed LVEF 60 to 65% Continue IV diuretics until the patient reaches his dry weight Start strict I's and O's and daily weight  Hyperlipidemia Resume home regimen  Polyneuropathy Resume home regimen  Obesity BMI 33 Recommend weight loss outpatient with regular physical activity and healthy dieting.  Hypokalemia Serum potassium 3.4 Repleted orally Repeat BMP in the morning obtain magnesium level   Time: 75 minutes.   DVT prophylaxis: Subcu Lovenox daily  Code Status: DNR  Family Communication: None at bedside  Disposition Plan: Admitted to telemetry cardiac unit  Consults called: None.  Admission status: Inpatient status.   Status is: Inpatient The patient requires at least 2 midnights for further evaluation and treatment of present condition.   Darlin Drop MD [facility] Pager [phone]  If 7PM-7AM, please contact night-coverage [email or web address] Password [email or web address]  11/21/2023, 7:30 PM

## 2023-11-21 NOTE — Telephone Encounter (Signed)
Noted. Pt has moved into assisted living in [place], [place]

## 2023-11-21 NOTE — Telephone Encounter (Signed)
Copied from CRM [id number]. Topic: Clinical - Medical Advice >> Nov 21, 2023  1:41 PM Elizebeth Brooking wrote: Reason for CRM: Kami Nurse from [facility] called stated EMS was Called, Shortness of breath and possible aspirations

## 2023-11-21 NOTE — ED Provider Triage Note (Signed)
Emergency Medicine Provider Triage Evaluation Note  Larry Elliott , a 78 y.o. male  was evaluated in triage.  Pt complains of abd distention, SOB, in the setting of missing his home lasix for 2 days. He denies chest pain, fever or chills.  Review of Systems  Positive: SOB, abd swelling Negative: Chest pain, fever, chills  Physical Exam  BP (!) 159/79   Pulse 79   Temp 98.3 F (36.8 C)   Resp (!) 24   SpO2 94%  Gen:   Awake, no distress   Resp:  Normal effort, bibasilar crackles, no tachypnea on O2 via [place] MSK:   Moves extremities without difficulty  Abd:  Mild distention present   Medical Decision Making  Medically screening exam initiated at 2:48 PM.  Appropriate orders placed.  Krystal Clark was informed that the remainder of the evaluation will be completed by another provider, this initial triage assessment does not replace that evaluation, and the importance of remaining in the ED until their evaluation is complete.  Workup initiated to include EKG, chest x-ray, screening labs.  Ordered Lasix and potassium in the setting of the patient missing his home Lasix with concern for worsening CHF.   Ernie Avena, MD 11/21/23 [phone]

## 2023-11-21 NOTE — ED Triage Notes (Signed)
Pt presents via EMS from home for edema. Forgot lasix x 2 days, abd distention. Tachypnea  noted by GFD which improved after EMS sat him up.  97%RA, on 2L wth GFD for comfort, 168/85, 78bpm H/o CHF  He remains 98% RA in ER triage.

## 2023-11-22 ENCOUNTER — Encounter: Payer: Self-pay | Admitting: Internal Medicine

## 2023-11-22 ENCOUNTER — Other Ambulatory Visit: Payer: Self-pay

## 2023-11-22 ENCOUNTER — Encounter (HOSPITAL_COMMUNITY): Payer: Self-pay | Admitting: Internal Medicine

## 2023-11-22 DIAGNOSIS — I5033 Acute on chronic diastolic (congestive) heart failure: Secondary | ICD-10-CM

## 2023-11-22 LAB — BASIC METABOLIC PANEL
Anion gap: 9 (ref 5–15)
BUN: 10 mg/dL (ref 8–23)
CO2: 23 mmol/L (ref 22–32)
Calcium: 9.1 mg/dL (ref 8.9–10.3)
Chloride: 107 mmol/L (ref 98–111)
Creatinine, Ser: 0.96 mg/dL (ref 0.61–1.24)
GFR, Estimated: >60 mL/min (ref >60)
Glucose, Bld: 111 mg/dL — ABNORMAL HIGH (ref 70–99)
Potassium: 3.6 mmol/L (ref 3.5–5.1)
Sodium: 139 mmol/L (ref 135–145)

## 2023-11-22 LAB — CBC
HCT: 45.8 % (ref 39.0–52.0)
Hemoglobin: 14.8 g/dL (ref 13.0–17.0)
MCH: 28 pg (ref 26.0–34.0)
MCHC: 32.3 g/dL (ref 30.0–36.0)
MCV: 86.6 fL (ref 80.0–100.0)
Platelets: 409 10*3/uL — ABNORMAL HIGH (ref 150–400)
RBC: 5.29 MIL/uL (ref 4.22–5.81)
RDW: 15.6 % — ABNORMAL HIGH (ref 11.5–15.5)
WBC: 10.4 10*3/uL (ref 4.0–10.5)
nRBC: 0 % (ref 0.0–0.2)

## 2023-11-22 LAB — MAGNESIUM: Magnesium: 2.2 mg/dL (ref 1.7–2.4)

## 2023-11-22 LAB — PHOSPHORUS: Phosphorus: 2.8 mg/dL (ref 2.5–4.6)

## 2023-11-22 MED ORDER — POTASSIUM CHLORIDE CRYS ER 20 MEQ PO TBCR
40.0000 meq | EXTENDED_RELEASE_TABLET | Freq: Once | ORAL | Status: AC
  Administered 2023-11-22: 40 meq via ORAL
  Filled 2023-11-22: qty 2

## 2023-11-22 MED ORDER — SERTRALINE HCL 50 MG PO TABS
50.0000 mg | ORAL_TABLET | Freq: Every day | ORAL | Status: DC
  Administered 2023-11-22 – 2023-11-25 (×4): 50 mg via ORAL
  Filled 2023-11-22 (×4): qty 1

## 2023-11-22 MED ORDER — DIPHENHYDRAMINE-ZINC ACETATE 2-0.1 % EX CREA
TOPICAL_CREAM | Freq: Two times a day (BID) | CUTANEOUS | Status: DC | PRN
  Filled 2023-11-22: qty 28

## 2023-11-22 MED ORDER — SPIRONOLACTONE 25 MG PO TABS
25.0000 mg | ORAL_TABLET | Freq: Every day | ORAL | Status: DC
  Administered 2023-11-22 – 2023-11-25 (×4): 25 mg via ORAL
  Filled 2023-11-22 (×4): qty 1

## 2023-11-22 MED ORDER — FUROSEMIDE 10 MG/ML IJ SOLN
40.0000 mg | Freq: Two times a day (BID) | INTRAMUSCULAR | Status: AC
  Administered 2023-11-22 – 2023-11-23 (×2): 40 mg via INTRAVENOUS
  Filled 2023-11-22 (×2): qty 4

## 2023-11-22 MED ORDER — DONEPEZIL HCL 10 MG PO TABS
10.0000 mg | ORAL_TABLET | Freq: Every day | ORAL | Status: DC
  Administered 2023-11-22 – 2023-11-24 (×3): 10 mg via ORAL
  Filled 2023-11-22 (×3): qty 1

## 2023-11-22 MED ORDER — GABAPENTIN 100 MG PO CAPS
100.0000 mg | ORAL_CAPSULE | Freq: Three times a day (TID) | ORAL | Status: DC
Start: 2023-11-22 — End: 2023-11-25
  Administered 2023-11-22 – 2023-11-25 (×10): 100 mg via ORAL
  Filled 2023-11-22 (×10): qty 1

## 2023-11-22 MED ORDER — EMPAGLIFLOZIN 10 MG PO TABS
10.0000 mg | ORAL_TABLET | Freq: Every day | ORAL | Status: DC
  Administered 2023-11-22 – 2023-11-25 (×4): 10 mg via ORAL
  Filled 2023-11-22 (×4): qty 1

## 2023-11-22 NOTE — ED Notes (Signed)
Pt up at bedside to urinate.

## 2023-11-22 NOTE — Progress Notes (Signed)
PROGRESS NOTE    Larry Elliott  ZOX:[id number] DOB: 08-23-1946 DOA: 11/21/2023 PCP: Wanda Plump, MD  78/M with chronic diastolic CHF, anxiety, depression, hypertension, dyslipidemia, polyneuropathy presented to the ED with progressive dyspnea on exertion and swelling for 1 week, reports compliance with diuretics however eats plenty of processed food. -In the ER BNP 345, troponin 5, creatinine 0.8, chest x-ray with bibasilar atelectasis   Subjective: -Complains of weakness  Assessment and Plan:  Acute on chronic HFpEF -Last echo 10/24 with preserved EF, normal RV, no significant valvular disease -Has chronic lower extremity edema at baseline, reports compliance with diuretics for the most part, eats ultra processed foods every day, counseled -Continue IV Lasix, increased dose to 40 Mg twice daily -Restart Aldactone and Jardiance -Dietitian consult   Polyneuropathy -Restart gabapentin, check B12  Mild cognitive deficits Continue Aricept   Obesity BMI 33 Recommend weight loss outpatient with regular physical activity and healthy dieting.   Hypokalemia Repleted  Anxiety, depression Restart Zoloft 50    DVT prophylaxis: Subcu Lovenox daily   Code Status: DNR   Family Communication: None at bedside Disposition Plan: Home likely 48 hours  Consultants:    Procedures:   Antimicrobials:    Objective: Vitals:   11/22/23 0825 11/22/23 0830 11/22/23 0900 11/22/23 0930  BP: (!) 146/76 (!) 123/90 (!) 152/69 139/87  Pulse: 94 91 94 92  Resp: (!) 30 (!) 29 (!) 37 (!) 29  Temp: 98 F (36.7 C)     TempSrc: Oral     SpO2: 96% 98% 99% 95%  Weight:        Intake/Output Summary (Last 24 hours) at 11/22/2023 1002 Last data filed at 11/21/2023 1740 Gross per 24 hour  Intake --  Output 150 ml  Net -150 ml   Filed Weights   11/21/23 1532  Weight: 106 kg    Examination:  General exam: Appears calm and comfortable  Respiratory system: Clear to  auscultation Cardiovascular system: S1 & S2 heard, RRR.  Abd: nondistended, soft and nontender.Normal bowel sounds heard. Central nervous system: Alert and oriented. No focal neurological deficits. Extremities: no edema Skin: No rashes Psychiatry:  Mood & affect appropriate.     Data Reviewed:   CBC: Recent Labs  Lab 11/21/23 1715 11/21/23 2025 11/22/23 0457  WBC 9.5 9.6 10.4  NEUTROABS 7.2  --   --   HGB 15.3 14.6 14.8  HCT 49.4 45.4 45.8  MCV 93.0 87.5 86.6  PLT 326 377 409*   Basic Metabolic Panel: Recent Labs  Lab 11/21/23 1758 11/22/23 0457  NA 140 139  K 3.4* 3.6  CL 107 107  CO2 24 23  GLUCOSE 99 111*  BUN 9 10  CREATININE 0.86 0.96  CALCIUM 9.2 9.1  MG  --  2.2  PHOS  --  2.8   GFR: Estimated Creatinine Clearance: 74.8 mL/min (by C-G formula based on SCr of 0.96 mg/dL). Liver Function Tests: No results for input(s): "AST", "ALT", "ALKPHOS", "BILITOT", "PROT", "ALBUMIN" in the last 168 hours. No results for input(s): "LIPASE", "AMYLASE" in the last 168 hours. No results for input(s): "AMMONIA" in the last 168 hours. Coagulation Profile: No results for input(s): "INR", "PROTIME" in the last 168 hours. Cardiac Enzymes: No results for input(s): "CKTOTAL", "CKMB", "CKMBINDEX", "TROPONINI" in the last 168 hours. BNP (last 3 results) Recent Labs    01/09/23 1436 07/25/23 1337  PROBNP 14.0 12.0   HbA1C: No results for input(s): "HGBA1C" in the last 72 hours. CBG: No  results for input(s): "GLUCAP" in the last 168 hours. Lipid Profile: No results for input(s): "CHOL", "HDL", "LDLCALC", "TRIG", "CHOLHDL", "LDLDIRECT" in the last 72 hours. Thyroid Function Tests: No results for input(s): "TSH", "T4TOTAL", "FREET4", "T3FREE", "THYROIDAB" in the last 72 hours. Anemia Panel: No results for input(s): "VITAMINB12", "FOLATE", "FERRITIN", "TIBC", "IRON", "RETICCTPCT" in the last 72 hours. Urine analysis:    Component Value Date/Time   COLORURINE YELLOW  12/28/2022 2159   APPEARANCEUR CLOUDY (A) 12/28/2022 2159   LABSPEC 1.021 12/28/2022 2159   PHURINE 5.0 12/28/2022 2159   GLUCOSEU NEGATIVE 12/28/2022 2159   GLUCOSEU NEGATIVE 10/08/2014 1055   HGBUR NEGATIVE 12/28/2022 2159   BILIRUBINUR negative 02/17/2023 1449   KETONESUR NEGATIVE 12/28/2022 2159   PROTEINUR Positive (A) 02/17/2023 1449   PROTEINUR NEGATIVE 12/28/2022 2159   UROBILINOGEN 0.2 02/17/2023 1449   UROBILINOGEN 0.2 10/08/2014 1055   NITRITE negative 02/17/2023 1449   NITRITE NEGATIVE 12/28/2022 2159   LEUKOCYTESUR Negative 02/17/2023 1449   LEUKOCYTESUR NEGATIVE 12/28/2022 2159   Sepsis Labs: @LABRCNTIP (procalcitonin:4,lacticidven:4)  )No results found for this or any previous visit (from the past 240 hours).   Radiology Studies: DG Chest Port 1 View Result Date: 11/21/2023 CLINICAL DATA:  Shortness of breath. EXAM: PORTABLE CHEST 1 VIEW COMPARISON:  October 12, 2023. FINDINGS: Stable cardiomediastinal silhouette. Minimal bibasilar subsegmental atelectasis is noted. Bony thorax is unremarkable. IMPRESSION: Minimal bibasilar subsegmental atelectasis. Electronically Signed   By: Lupita Raider M.D.   On: 11/21/2023 16:03     Scheduled Meds:  enoxaparin (LOVENOX) injection  50 mg Subcutaneous Q24H   Continuous Infusions:   LOS: 1 day    Time spent:    Zannie Cove, MD [facility]   11/22/2023, 10:02 AM

## 2023-11-22 NOTE — Plan of Care (Signed)
Problem: Education: Goal: Knowledge of General Education information will improve Description: Including pain rating scale, medication(s)/side effects and non-pharmacologic comfort measures Outcome: Progressing   Problem: Clinical Measurements: Goal: Ability to maintain clinical measurements within normal limits will improve Outcome: Progressing   Problem: Clinical Measurements: Goal: Will remain free from infection Outcome: Progressing   Problem: Clinical Measurements: Goal: Respiratory complications will improve Outcome: Progressing   Problem: Clinical Measurements: Goal: Cardiovascular complication will be avoided Outcome: Progressing   Problem: Activity: Goal: Risk for activity intolerance will decrease Outcome: Progressing

## 2023-11-22 NOTE — Progress Notes (Signed)
Orthopedic Tech Progress Note Patient Details:  Larry Elliott 06-29-1946 [id number]  Ortho Devices Type of Ortho Device: Roland Rack boot Ortho Device/Splint Location: Bilateral Ortho Device/Splint Interventions: Ordered, Application   Post Interventions Patient Tolerated: Well  Tonye Pearson 11/22/2023, 2:41 PM

## 2023-11-23 ENCOUNTER — Encounter: Payer: Self-pay | Admitting: Cardiology

## 2023-11-23 DIAGNOSIS — I5033 Acute on chronic diastolic (congestive) heart failure: Secondary | ICD-10-CM | POA: Diagnosis not present

## 2023-11-23 LAB — BASIC METABOLIC PANEL
Anion gap: 11 (ref 5–15)
BUN: 13 mg/dL (ref 8–23)
CO2: 22 mmol/L (ref 22–32)
Calcium: 8.9 mg/dL (ref 8.9–10.3)
Chloride: 104 mmol/L (ref 98–111)
Creatinine, Ser: 1.24 mg/dL (ref 0.61–1.24)
GFR, Estimated: 59 mL/min — ABNORMAL LOW (ref >60)
Glucose, Bld: 105 mg/dL — ABNORMAL HIGH (ref 70–99)
Potassium: 3.1 mmol/L — ABNORMAL LOW (ref 3.5–5.1)
Sodium: 137 mmol/L (ref 135–145)

## 2023-11-23 MED ORDER — POTASSIUM CHLORIDE CRYS ER 20 MEQ PO TBCR
40.0000 meq | EXTENDED_RELEASE_TABLET | Freq: Two times a day (BID) | ORAL | Status: AC
  Administered 2023-11-23 (×2): 40 meq via ORAL
  Filled 2023-11-23 (×2): qty 2

## 2023-11-23 MED ORDER — FUROSEMIDE 10 MG/ML IJ SOLN
40.0000 mg | Freq: Two times a day (BID) | INTRAMUSCULAR | Status: DC
  Administered 2023-11-23 – 2023-11-24 (×3): 40 mg via INTRAVENOUS
  Filled 2023-11-23 (×3): qty 4

## 2023-11-23 NOTE — Progress Notes (Signed)
PROGRESS NOTE    Larry Elliott  ZOX:[id number] DOB: Sep 11, 1946 DOA: 11/21/2023 PCP: Wanda Plump, MD  78/M with chronic diastolic CHF, anxiety, depression, hypertension, dyslipidemia, polyneuropathy presented to the ED with progressive dyspnea on exertion and swelling for 1 week, reports compliance with diuretics however eats plenty of processed food. -In the ER BNP 345, troponin 5, creatinine 0.8, chest x-ray with bibasilar atelectasis   Subjective: -Feels better today breathing and swelling is starting to improve  Assessment and Plan:  Acute on chronic HFpEF -Last echo 10/24 with preserved EF, normal RV, no significant valvular disease -Has chronic lower extremity edema at baseline, reports compliance with diuretics for the most part, eats ultra processed foods every day, counseled -Continue IV Lasix, Aldactone and Jardiance  -Dietitian consult -Increase activity   Polyneuropathy -Restart gabapentin, check B12  Hyperglycemia A1c recently was 6.6, consistent with new diabetes Continue Jardiance  Mild cognitive deficits Continue Aricept   Obesity BMI 33 Recommend weight loss outpatient with regular physical activity and healthy dieting.   Hypokalemia Repleted  Anxiety, depression Restart Zoloft 50    DVT prophylaxis: Subcu Lovenox daily   Code Status: DNR   Family Communication: None at bedside Disposition Plan: Home likely 48 hours  Consultants:    Procedures:   Antimicrobials:    Objective: Vitals:   11/22/23 2056 11/23/23 0212 11/23/23 0529 11/23/23 0750  BP: 124/85 137/79 134/72 103/61  Pulse: 86 90 80 64  Resp: 19 17 20  (!) 23  Temp: 99.1 F (37.3 C) 98 F (36.7 C) 98.5 F (36.9 C) 98.2 F (36.8 C)  TempSrc: Oral Axillary Oral   SpO2: 96% 93% 94% 93%  Weight:   107.2 kg   Height:        Intake/Output Summary (Last 24 hours) at 11/23/2023 1214 Last data filed at 11/23/2023 0918 Gross per 24 hour  Intake 594 ml  Output 2600 ml  Net  -2006 ml   Filed Weights   11/21/23 1532 11/22/23 1332 11/23/23 0529  Weight: 106 kg 108.5 kg 107.2 kg    Examination:  General exam: Appears calm and comfortable  Respiratory system: Clear to auscultation Cardiovascular system: S1 & S2 heard, RRR.  Abd: nondistended, soft and nontender.Normal bowel sounds heard. Central nervous system: Alert and oriented. No focal neurological deficits. Extremities: no edema Skin: No rashes Psychiatry:  Mood & affect appropriate.     Data Reviewed:   CBC: Recent Labs  Lab 11/21/23 1715 11/21/23 2025 11/22/23 0457  WBC 9.5 9.6 10.4  NEUTROABS 7.2  --   --   HGB 15.3 14.6 14.8  HCT 49.4 45.4 45.8  MCV 93.0 87.5 86.6  PLT 326 377 409*   Basic Metabolic Panel: Recent Labs  Lab 11/21/23 1758 11/22/23 0457 11/23/23 0238  NA 140 139 137  K 3.4* 3.6 3.1*  CL 107 107 104  CO2 24 23 22   GLUCOSE 99 111* 105*  BUN 9 10 13   CREATININE 0.86 0.96 1.24  CALCIUM 9.2 9.1 8.9  MG  --  2.2  --   PHOS  --  2.8  --    GFR: Estimated Creatinine Clearance: 58.3 mL/min (by C-G formula based on SCr of 1.24 mg/dL). Liver Function Tests: No results for input(s): "AST", "ALT", "ALKPHOS", "BILITOT", "PROT", "ALBUMIN" in the last 168 hours. No results for input(s): "LIPASE", "AMYLASE" in the last 168 hours. No results for input(s): "AMMONIA" in the last 168 hours. Coagulation Profile: No results for input(s): "INR", "PROTIME" in the last  168 hours. Cardiac Enzymes: No results for input(s): "CKTOTAL", "CKMB", "CKMBINDEX", "TROPONINI" in the last 168 hours. BNP (last 3 results) Recent Labs    01/09/23 1436 07/25/23 1337  PROBNP 14.0 12.0   HbA1C: No results for input(s): "HGBA1C" in the last 72 hours. CBG: No results for input(s): "GLUCAP" in the last 168 hours. Lipid Profile: No results for input(s): "CHOL", "HDL", "LDLCALC", "TRIG", "CHOLHDL", "LDLDIRECT" in the last 72 hours. Thyroid Function Tests: No results for input(s): "TSH",  "T4TOTAL", "FREET4", "T3FREE", "THYROIDAB" in the last 72 hours. Anemia Panel: No results for input(s): "VITAMINB12", "FOLATE", "FERRITIN", "TIBC", "IRON", "RETICCTPCT" in the last 72 hours. Urine analysis:    Component Value Date/Time   COLORURINE YELLOW 12/28/2022 2159   APPEARANCEUR CLOUDY (A) 12/28/2022 2159   LABSPEC 1.021 12/28/2022 2159   PHURINE 5.0 12/28/2022 2159   GLUCOSEU NEGATIVE 12/28/2022 2159   GLUCOSEU NEGATIVE 10/08/2014 1055   HGBUR NEGATIVE 12/28/2022 2159   BILIRUBINUR negative 02/17/2023 1449   KETONESUR NEGATIVE 12/28/2022 2159   PROTEINUR Positive (A) 02/17/2023 1449   PROTEINUR NEGATIVE 12/28/2022 2159   UROBILINOGEN 0.2 02/17/2023 1449   UROBILINOGEN 0.2 10/08/2014 1055   NITRITE negative 02/17/2023 1449   NITRITE NEGATIVE 12/28/2022 2159   LEUKOCYTESUR Negative 02/17/2023 1449   LEUKOCYTESUR NEGATIVE 12/28/2022 2159   Sepsis Labs: @LABRCNTIP (procalcitonin:4,lacticidven:4)  )No results found for this or any previous visit (from the past 240 hours).   Radiology Studies: DG Chest Port 1 View Result Date: 11/21/2023 CLINICAL DATA:  Shortness of breath. EXAM: PORTABLE CHEST 1 VIEW COMPARISON:  October 12, 2023. FINDINGS: Stable cardiomediastinal silhouette. Minimal bibasilar subsegmental atelectasis is noted. Bony thorax is unremarkable. IMPRESSION: Minimal bibasilar subsegmental atelectasis. Electronically Signed   By: Lupita Raider M.D.   On: 11/21/2023 16:03     Scheduled Meds:  donepezil  10 mg Oral QHS   empagliflozin  10 mg Oral Daily   enoxaparin (LOVENOX) injection  50 mg Subcutaneous Q24H   furosemide  40 mg Intravenous BID   gabapentin  100 mg Oral TID   potassium chloride  40 mEq Oral BID   sertraline  50 mg Oral Daily   spironolactone  25 mg Oral Daily   Continuous Infusions:   LOS: 2 days    Time spent:    Zannie Cove, MD [facility]   11/23/2023, 12:14 PM

## 2023-11-23 NOTE — Progress Notes (Signed)
Mobility Specialist Progress Note:   11/23/23 0935  Mobility  Activity Ambulated with assistance in hallway  Level of Assistance Contact guard assist, steadying assist  Assistive Device None  Distance Ambulated (ft) 250 ft  Activity Response Tolerated well  Mobility Referral Yes  Mobility visit 1 Mobility  Mobility Specialist Start Time (ACUTE ONLY) 0935  Mobility Specialist Stop Time (ACUTE ONLY) 0947  Mobility Specialist Time Calculation (min) (ACUTE ONLY) 12 min   Pt agreeable to mobility session. Required only minG for safety. SpO2 91-92% on RA throughout. Pt back in bed with all needs met.   Addison Lank Mobility Specialist Please contact via SecureChat or  Rehab office at [phone]

## 2023-11-23 NOTE — Plan of Care (Signed)
Problem: Education: Goal: Knowledge of General Education information will improve Description Including pain rating scale, medication(s)/side effects and non-pharmacologic comfort measures Outcome: Progressing   Problem: Health Behavior/Discharge Planning: Goal: Ability to manage health-related needs will improve Outcome: Progressing

## 2023-11-23 NOTE — Plan of Care (Signed)
Problem: Health Behavior/Discharge Planning: Goal: Ability to manage health-related needs will improve Outcome: Progressing   Problem: Clinical Measurements: Goal: Ability to maintain clinical measurements within normal limits will improve Outcome: Progressing Goal: Will remain free from infection Outcome: Progressing   Problem: Activity: Goal: Risk for activity intolerance will decrease Outcome: Progressing

## 2023-11-23 NOTE — TOC Initial Note (Signed)
Transition of Care [facility]) - Inpatient Brief Assessment   Patient Details  Name: Larry Elliott MRN: [id number] Date of Birth: 1946/07/23  Transition of Care [facility]) CM/SW Contact:    Michaela Corner, LCSWA Phone Number: 11/23/2023, 2:17 PM   Clinical Narrative: RN notified MD and CSW that pts dtr, Larry Elliott, would like to disucss dc. CSW spoke with Larry Elliott, about dc plan. Larry Elliott is moving her parents into [facility] and wanted to discuss timing of her fathers dc. Larry Elliott stated she will be in [place] tom morning with movers at family house and plans to follow the movers with her mom to [place], [place].    Transition of Care Asessment: Insurance and Status: Insurance coverage has been reviewed Patient has primary care physician: Yes Home environment has been reviewed: From home w/ wife Prior level of function:: Independent Prior/Current Home Services: No current home services Social Drivers of Health Review: SDOH reviewed no interventions necessary Readmission risk has been reviewed: Yes Transition of care needs: no transition of care needs at this time

## 2023-11-23 NOTE — Progress Notes (Signed)
Heart Failure Navigator Progress Note  Assessed for Heart & Vascular TOC clinic readiness.  Patient does not meet criteria due to EF 60-65%, has a scheduled [facility] appointment on 11/27/2023. No HF TOC per Dr. Jomarie Longs. .   Navigator will sign off at this time.   Rhae Hammock, BSN, Scientist, clinical (histocompatibility and immunogenetics) Only

## 2023-11-24 DIAGNOSIS — I5033 Acute on chronic diastolic (congestive) heart failure: Secondary | ICD-10-CM | POA: Diagnosis not present

## 2023-11-24 LAB — VITAMIN B12: Vitamin B-12: 312 pg/mL (ref 180–914)

## 2023-11-24 LAB — BASIC METABOLIC PANEL
Anion gap: 9 (ref 5–15)
BUN: 14 mg/dL (ref 8–23)
CO2: 25 mmol/L (ref 22–32)
Calcium: 9 mg/dL (ref 8.9–10.3)
Chloride: 104 mmol/L (ref 98–111)
Creatinine, Ser: 0.99 mg/dL (ref 0.61–1.24)
GFR, Estimated: >60 mL/min (ref >60)
Glucose, Bld: 100 mg/dL — ABNORMAL HIGH (ref 70–99)
Potassium: 3.9 mmol/L (ref 3.5–5.1)
Sodium: 138 mmol/L (ref 135–145)

## 2023-11-24 MED ORDER — POTASSIUM CHLORIDE CRYS ER 20 MEQ PO TBCR
40.0000 meq | EXTENDED_RELEASE_TABLET | Freq: Once | ORAL | Status: AC
  Administered 2023-11-24: 40 meq via ORAL
  Filled 2023-11-24: qty 2

## 2023-11-24 NOTE — Progress Notes (Signed)
Mobility Specialist Progress Note:   11/24/23 1002  Mobility  Activity Ambulated with assistance in hallway  Level of Assistance Contact guard assist, steadying assist  Assistive Device None  Distance Ambulated (ft) 300 ft  Activity Response Tolerated well  Mobility Referral Yes  Mobility visit 1 Mobility  Mobility Specialist Start Time (ACUTE ONLY) 1002  Mobility Specialist Stop Time (ACUTE ONLY) 1015  Mobility Specialist Time Calculation (min) (ACUTE ONLY) 13 min   Pt agreeable to mobility session. Required only minG assist for safety during ambulation with no AD. No c/o throughout. Pt back sitting EOB with all needs met.   Addison Lank Mobility Specialist Please contact via SecureChat or  Rehab office at [phone]

## 2023-11-24 NOTE — Plan of Care (Signed)
   Problem: Activity: Goal: Risk for activity intolerance will decrease Outcome: Progressing   Problem: Nutrition: Goal: Adequate nutrition will be maintained Outcome: Progressing

## 2023-11-24 NOTE — Care Management Important Message (Signed)
Important Message  Patient Details  Name: Larry Elliott MRN: [id number] Date of Birth: 1946/05/24   Important Message Given:  Yes - Medicare IM     Renie Ora 11/24/2023, 10:36 AM

## 2023-11-24 NOTE — Progress Notes (Signed)
PROGRESS NOTE    Larry Elliott  OZD:[id number] DOB: [birth year] DOA: 11/21/2023 PCP: Wanda Plump, MD  78/M with chronic diastolic CHF, anxiety, depression, hypertension, dyslipidemia, polyneuropathy presented to the ED with progressive dyspnea on exertion and swelling for 1 week, reports compliance with diuretics however eats plenty of processed food. -In the ER BNP 345, troponin 5, creatinine 0.8, chest x-ray with bibasilar atelectasis   Subjective: -Feels better overall, Unna boots on, swelling improving  Assessment and Plan:  Acute on chronic HFpEF -Last echo 10/24 with preserved EF, normal RV, no significant valvular disease -Has chronic lower extremity edema at baseline, reports compliance with diuretics for the most part, eats ultra processed foods every day, counseled -Improving with diuresis, 3.4 L negative, continue IV Lasix 1 more day, Aldactone and Jardiance  -Dietitian consult -Increase activity, discharge planning, home tomorrow if stable   Polyneuropathy -Restart gabapentin, B12 is low normal, add replacement  Hyperglycemia A1c recently was 6.6, consistent with new diabetes Continue Jardiance  Mild cognitive deficits Continue Aricept   Obesity BMI 33 Recommend weight loss outpatient with regular physical activity and healthy dieting.   Hypokalemia Repleted  Anxiety, depression Restart Zoloft 50    DVT prophylaxis: Subcu Lovenox daily   Code Status: DNR   Family Communication: None at bedside, called and updated daughter Disposition Plan: Home tomorrow  Consultants:    Procedures:   Antimicrobials:    Objective: Vitals:   11/24/23 0400 11/24/23 0552 11/24/23 0748 11/24/23 1107  BP: (!) 108/53  (!) 118/57 130/78  Pulse: 61  65 86  Resp: 15  19 20   Temp: 98.6 F (37 C)  97.7 F (36.5 C) 98.4 F (36.9 C)  TempSrc: Oral  Oral Oral  SpO2: 95%  93% 95%  Weight:  106 kg    Height:        Intake/Output Summary (Last 24 hours) at 11/24/2023  1114 Last data filed at 11/24/2023 1108 Gross per 24 hour  Intake 531 ml  Output 2550 ml  Net -2019 ml   Filed Weights   11/22/23 1332 11/23/23 0529 11/24/23 0552  Weight: 108.5 kg 107.2 kg 106 kg    Examination:  General exam: Pleasant male sitting up in bed, AAOx3 HEENT: Positive JVD CVS: S1-S2, regular rhythm Lungs: Decreased breath sounds at the bases Abdomen: Soft, nontender, bowel sounds present Extremities: No edema Skin: No rashes Psychiatry:  Mood & affect appropriate.     Data Reviewed:   CBC: Recent Labs  Lab 11/21/23 1715 11/21/23 2025 11/22/23 0457  WBC 9.5 9.6 10.4  NEUTROABS 7.2  --   --   HGB 15.3 14.6 14.8  HCT 49.4 45.4 45.8  MCV 93.0 87.5 86.6  PLT 326 377 409*   Basic Metabolic Panel: Recent Labs  Lab 11/21/23 1758 11/22/23 0457 11/23/23 0238 11/24/23 0250  NA 140 139 137 138  K 3.4* 3.6 3.1* 3.9  CL 107 107 104 104  CO2 24 23 22 25   GLUCOSE 99 111* 105* 100*  BUN 9 10 13 14   CREATININE 0.86 0.96 1.24 0.99  CALCIUM 9.2 9.1 8.9 9.0  MG  --  2.2  --   --   PHOS  --  2.8  --   --    GFR: Estimated Creatinine Clearance: 72.6 mL/min (by C-G formula based on SCr of 0.99 mg/dL). Liver Function Tests: No results for input(s): "AST", "ALT", "ALKPHOS", "BILITOT", "PROT", "ALBUMIN" in the last 168 hours. No results for input(s): "LIPASE", "AMYLASE" in the  last 168 hours. No results for input(s): "AMMONIA" in the last 168 hours. Coagulation Profile: No results for input(s): "INR", "PROTIME" in the last 168 hours. Cardiac Enzymes: No results for input(s): "CKTOTAL", "CKMB", "CKMBINDEX", "TROPONINI" in the last 168 hours. BNP (last 3 results) Recent Labs    01/09/23 1436 07/25/23 1337  PROBNP 14.0 12.0   HbA1C: No results for input(s): "HGBA1C" in the last 72 hours. CBG: No results for input(s): "GLUCAP" in the last 168 hours. Lipid Profile: No results for input(s): "CHOL", "HDL", "LDLCALC", "TRIG", "CHOLHDL", "LDLDIRECT" in the  last 72 hours. Thyroid Function Tests: No results for input(s): "TSH", "T4TOTAL", "FREET4", "T3FREE", "THYROIDAB" in the last 72 hours. Anemia Panel: Recent Labs    11/24/23 0250  VITAMINB12 312   Urine analysis:    Component Value Date/Time   COLORURINE YELLOW 12/28/2022 2159   APPEARANCEUR CLOUDY (A) 12/28/2022 2159   LABSPEC 1.021 12/28/2022 2159   PHURINE 5.0 12/28/2022 2159   GLUCOSEU NEGATIVE 12/28/2022 2159   GLUCOSEU NEGATIVE 10/08/2014 1055   HGBUR NEGATIVE 12/28/2022 2159   BILIRUBINUR negative 02/17/2023 1449   KETONESUR NEGATIVE 12/28/2022 2159   PROTEINUR Positive (A) 02/17/2023 1449   PROTEINUR NEGATIVE 12/28/2022 2159   UROBILINOGEN 0.2 02/17/2023 1449   UROBILINOGEN 0.2 10/08/2014 1055   NITRITE negative 02/17/2023 1449   NITRITE NEGATIVE 12/28/2022 2159   LEUKOCYTESUR Negative 02/17/2023 1449   LEUKOCYTESUR NEGATIVE 12/28/2022 2159   Sepsis Labs: @LABRCNTIP (procalcitonin:4,lacticidven:4)  )No results found for this or any previous visit (from the past 240 hours).   Radiology Studies: No results found.    Scheduled Meds:  donepezil  10 mg Oral QHS   empagliflozin  10 mg Oral Daily   enoxaparin (LOVENOX) injection  50 mg Subcutaneous Q24H   furosemide  40 mg Intravenous BID   gabapentin  100 mg Oral TID   sertraline  50 mg Oral Daily   spironolactone  25 mg Oral Daily   Continuous Infusions:   LOS: 3 days    Time spent:    Zannie Cove, MD [facility]   11/24/2023, 11:14 AM

## 2023-11-24 NOTE — Progress Notes (Signed)
Nutrition Education Note  RD consulted for nutrition education regarding a Heart Healthy diet.   24 Hour Recall B: Cereal w/ milk L: [employer] hot dog D: Frozen meal and chocolate milk  Discussed current diet intake and recommended changes at bedside. He states his wife does most of the cooking. Does consume significant amount of processed foods. Endorses adequate appetite with no recent changes in weight or intake level prior to admission. His daughter is a PT and he states, "she'll get me right," when asked how he feels about modifying his current diet. Encouraged him to share handouts provided with her.  Set to discharge home tomorrow. Left name and contact information with patient for daughter to call if she has questions r/t education provided.    Lipid Panel     Component Value Date/Time   CHOL 138 05/03/2023 1158   TRIG 315.0 (H) 05/03/2023 1158   TRIG 276 (HH) 09/05/2006 0835   HDL 40.50 05/03/2023 1158   CHOLHDL 3 05/03/2023 1158   VLDL 63.0 (H) 05/03/2023 1158   LDLCALC 56 02/19/2013 1624    RD provided "Heart Healthy Nutrition Therapy" handout from the [employer]. Reviewed patient's dietary recall. Provided examples on ways to decrease sodium and fat intake in diet. Discouraged intake of processed foods and use of salt shaker. Encouraged fresh fruits and vegetables as well as whole grain sources of carbohydrates to maximize fiber intake.   Expect adequate compliance w/ assistance from his daughter.  Body mass index is 33.53 kg/m. Pt meets criteria for obesity, class I based on current BMI.  Current diet order is heart healthy, patient is consuming approximately 87% x 6 documented meals at this time. Labs and medications reviewed. No further nutrition interventions warranted at this time. RD contact information provided. If additional nutrition issues arise, please re-consult RD.  Myrtie Cruise MS, RD, LDN Registered Dietitian Clinical  Nutrition RD Inpatient Contact Info in Amion

## 2023-11-24 NOTE — Plan of Care (Signed)

## 2023-11-24 NOTE — Consult Note (Signed)
[facility] Liaison Consult Note   11/24/2023  Larry Elliott Jul 16, 1946 [id number]  Insurance: Medicare ACO REACH   Primary Care Provider: Wanda Plump, MD, with [place] at [facility], [place]  this provider is listed for the transition of care follow up appointments  and [facility]I [facility] calls   [facility] Liaison met patient at bedside at [facility]. Patient sitting on side of bed. HIPAA verified, Alert and oriented.  Explained reason for bedside visit.  Patient endorses PCP however he is not sure who his "new " PCP will be as he states that he and his wife is moving to [place], [place] very soon.  Patient states that his daughter Wonda Horner is a PT and she is arranging things and busy today.  Noted ACP document in EPIC.  No family currently at the bedside.Reviewed inpatient TOC LCSW notes and discussed in morning progression meeting regarding move. Encouraged patient to speak with daughter regarding a new but PCP but expressed it would be good to follow up with current  PCP and doctors for a good transition to new area.   The patient was screened for 2-day  hospitalization with noted high risk score for unplanned readmission risk 3 hospital admissions in 6 months.  The patient was assessed for potential Community Care Coordination service needs for post hospital transition for care coordination. Review of patient's electronic medical record reveals patient is admitted with Acute on Chronic HF.   Plan: [facility] Liaison will continue to follow progress and disposition to asess for post hospital community care coordination needs.  Referral request for community care coordination: Anticipate the Community VBCI TOC team to follow up.  Following.   VBCI Community Care, Population Health does not replace or interfere with any arrangements made by the Inpatient Transition of Care team.   For questions contact:   Charlesetta Shanks, RN, BSN, CCM [place]  [facility], [facility] Liaison Direct Dial: [phone] or secure chat Email: [email or web address]

## 2023-11-25 ENCOUNTER — Other Ambulatory Visit (HOSPITAL_COMMUNITY): Payer: Self-pay

## 2023-11-25 DIAGNOSIS — I509 Heart failure, unspecified: Secondary | ICD-10-CM | POA: Diagnosis not present

## 2023-11-25 LAB — BASIC METABOLIC PANEL
Anion gap: 13 (ref 5–15)
BUN: 14 mg/dL (ref 8–23)
CO2: 25 mmol/L (ref 22–32)
Calcium: 8.9 mg/dL (ref 8.9–10.3)
Chloride: 102 mmol/L (ref 98–111)
Creatinine, Ser: 0.89 mg/dL (ref 0.61–1.24)
GFR, Estimated: >60 mL/min (ref >60)
Glucose, Bld: 101 mg/dL — ABNORMAL HIGH (ref 70–99)
Potassium: 2.9 mmol/L — ABNORMAL LOW (ref 3.5–5.1)
Sodium: 140 mmol/L (ref 135–145)

## 2023-11-25 MED ORDER — VITAMIN B-12 1000 MCG PO TABS
1000.0000 ug | ORAL_TABLET | Freq: Every day | ORAL | 0 refills | Status: AC
  Filled 2023-11-25: qty 30, 30d supply, fill #0

## 2023-11-25 MED ORDER — POTASSIUM CHLORIDE CRYS ER 20 MEQ PO TBCR
40.0000 meq | EXTENDED_RELEASE_TABLET | Freq: Every day | ORAL | 1 refills | Status: AC
  Filled 2023-11-25: qty 60, 30d supply, fill #0

## 2023-11-25 MED ORDER — POTASSIUM CHLORIDE CRYS ER 20 MEQ PO TBCR
60.0000 meq | EXTENDED_RELEASE_TABLET | ORAL | Status: AC
  Administered 2023-11-25 (×2): 60 meq via ORAL
  Filled 2023-11-25 (×2): qty 3

## 2023-11-25 MED ORDER — TORSEMIDE 20 MG PO TABS
20.0000 mg | ORAL_TABLET | Freq: Every day | ORAL | 0 refills | Status: AC
  Filled 2023-11-25: qty 30, 30d supply, fill #0

## 2023-11-25 NOTE — Plan of Care (Signed)

## 2023-11-25 NOTE — Progress Notes (Signed)
Mobility Specialist Progress Note;   11/25/23 0955  Mobility  Activity Ambulated with assistance in hallway  Level of Assistance Contact guard assist, steadying assist  Assistive Device None  Distance Ambulated (ft) 250 ft  Activity Response Tolerated well  Mobility Referral Yes  Mobility visit 1 Mobility  Mobility Specialist Start Time (ACUTE ONLY) 0955  Mobility Specialist Stop Time (ACUTE ONLY) 1006  Mobility Specialist Time Calculation (min) (ACUTE ONLY) 11 min   Pt agreeable to mobility. Required MinG assistance during ambulation for safety. VSS throughout and no c/o during session. Pt returned back to EOB w/ all needs met. Daughter in room.   Caesar Bookman Mobility Specialist Please contact via SecureChat or [employer] [phone]

## 2023-11-25 NOTE — Discharge Summary (Signed)
Physician Discharge Summary  Larry Elliott AVW:[id number] DOB: [birth year] DOA: 11/21/2023  PCP: Wanda Plump, MD  Admit date: 11/21/2023 Discharge date: 11/25/2023  Time spent: 45 minutes  Recommendations for Outpatient Follow-up:  Cardiology Dr. Jerene Pitch in 2 to 3 weeks BMP in 1 week   Discharge Diagnoses:  Principal Problem:   Acute on chronic congestive heart failure (HCC) Polyneuropathy Hyperglycemia Mild cognitive deficits Hypokalemia Obesity Anxiety Depression  Discharge Condition: Improved  Diet recommendation: Low-sodium, heart healthy  Filed Weights   11/23/23 0529 11/24/23 0552 11/25/23 0534  Weight: 107.2 kg 106 kg 104.2 kg    History of present illness:  78/M with chronic diastolic CHF, anxiety, depression, hypertension, dyslipidemia, polyneuropathy presented to the ED with progressive dyspnea on exertion and swelling for 1 week, reports compliance with diuretics however eats plenty of processed food. -In the ER BNP 345, troponin 5, creatinine 0.8, chest x-ray with bibasilar atelectasis  Hospital Course:  Acute on chronic HFpEF -Last echo 10/24 with preserved EF, normal RV, no significant valvular disease -Has chronic lower extremity edema at baseline, reports compliance with diuretics for the most part, eats ultra processed foods every day, counseled -Improving with diuresis, 4.9 L negative, changed to oral diuretics, continue Aldactone and Jardiance  -Dietitian consulted -Follow-up with [facility] in few weeks   Polyneuropathy -Restart gabapentin, B12 is low normal, add replacement   Hyperglycemia diabetes mellitus A1c recently was 6.6, consistent with new diabetes Continue Jardiance   Mild cognitive deficits Continue Aricept   Obesity BMI 33 Recommend weight loss outpatient with regular physical activity and healthy dieting.   Hypokalemia Repleted   Anxiety, depression Restart Zoloft 50  Discharge Exam: Vitals:   11/25/23 0900  11/25/23 0949  BP:  123/77  Pulse:  79  Resp: (!) 31 (!) 33  Temp: 98.7 F (37.1 C) 98.7 F (37.1 C)  SpO2:  95%   Gen: Awake, Alert, Oriented X 3,  HEENT: no JVD Lungs: Good air movement bilaterally, CTAB CVS: S1S2/RRR Abd: soft, Non tender, non distended, BS present Extremities: No edema Skin: no new rashes on exposed skin   Discharge Instructions   Discharge Instructions     Diet - low sodium heart healthy   Complete by: As directed    Increase activity slowly   Complete by: As directed       Allergies as of 11/25/2023       Reactions   Ambien [zolpidem] Other (See Comments)   Encephalopathy, admitted 04-2022, symptoms suspected to be from [place]   Aspirin Swelling   swelling in face   Hydrocodone Other (See Comments)   Insomnia, mild SOB (w/o cough-wheezing)   Penicillins Swelling   swelling in face        Medication List     STOP taking these medications    ketoconazole 2 % cream Commonly known as: NIZORAL       TAKE these medications    atorvastatin 40 MG tablet Commonly known as: LIPITOR Take 1 tablet (40 mg total) by mouth at bedtime.   cyanocobalamin 1000 MCG tablet Commonly known as: VITAMIN B12 Take 1 tablet (1,000 mcg total) by mouth daily.   donepezil 5 MG tablet Commonly known as: ARICEPT Take 1 tablet (5 mg total) by mouth at bedtime.   empagliflozin 10 MG Tabs tablet Commonly known as: JARDIANCE Take 1 tablet (10 mg total) by mouth daily. What changed: when to take this   fenofibrate micronized 134 MG capsule Commonly known as: LOFIBRA Take  1 capsule (134 mg total) by mouth daily before breakfast.   gabapentin 100 MG capsule Commonly known as: NEURONTIN Take 1 capsule (100 mg total) by mouth 3 (three) times daily.   melatonin 5 MG Tabs Take 1 tablet (5 mg total) by mouth at bedtime.   potassium chloride SA 20 MEQ tablet Commonly known as: KLOR-CON M Take 2 tablets (40 mEq total) by mouth daily. What changed: how much  to take   sertraline 50 MG tablet Commonly known as: ZOLOFT Take 1 tablet (50 mg total) by mouth daily. What changed: when to take this   spironolactone 25 MG tablet Commonly known as: ALDACTONE Take 1 tablet (25 mg total) by mouth daily. What changed: when to take this   torsemide 20 MG tablet Commonly known as: DEMADEX Take 1 tablet (20 mg total) by mouth daily. Take 20 Mg daily, extra 20 Mg as needed for weight gain of 3 LB in 1 day or 5 LB in 1 week What changed: additional instructions       Allergies  Allergen Reactions   Ambien [Zolpidem] Other (See Comments)    Encephalopathy, admitted 04-2022, symptoms suspected to be from [place]   Aspirin Swelling    swelling in face   Hydrocodone Other (See Comments)    Insomnia, mild SOB (w/o cough-wheezing)   Penicillins Swelling    swelling in face      The results of significant diagnostics from this hospitalization (including imaging, microbiology, ancillary and laboratory) are listed below for reference.    Significant Diagnostic Studies: DG Chest Port 1 View Result Date: 11/21/2023 CLINICAL DATA:  Shortness of breath. EXAM: PORTABLE CHEST 1 VIEW COMPARISON:  October 12, 2023. FINDINGS: Stable cardiomediastinal silhouette. Minimal bibasilar subsegmental atelectasis is noted. Bony thorax is unremarkable. IMPRESSION: Minimal bibasilar subsegmental atelectasis. Electronically Signed   By: Lupita Raider M.D.   On: 11/21/2023 16:03    Microbiology: No results found for this or any previous visit (from the past 240 hours).   Labs: Basic Metabolic Panel: Recent Labs  Lab 11/21/23 1758 11/22/23 0457 11/23/23 0238 11/24/23 0250 11/25/23 0233  NA 140 139 137 138 140  K 3.4* 3.6 3.1* 3.9 2.9*  CL 107 107 104 104 102  CO2 24 23 22 25 25   GLUCOSE 99 111* 105* 100* 101*  BUN 9 10 13 14 14   CREATININE 0.86 0.96 1.24 0.99 0.89  CALCIUM 9.2 9.1 8.9 9.0 8.9  MG  --  2.2  --   --   --   PHOS  --  2.8  --   --   --     Liver Function Tests: No results for input(s): "AST", "ALT", "ALKPHOS", "BILITOT", "PROT", "ALBUMIN" in the last 168 hours. No results for input(s): "LIPASE", "AMYLASE" in the last 168 hours. No results for input(s): "AMMONIA" in the last 168 hours. CBC: Recent Labs  Lab 11/21/23 1715 11/21/23 2025 11/22/23 0457  WBC 9.5 9.6 10.4  NEUTROABS 7.2  --   --   HGB 15.3 14.6 14.8  HCT 49.4 45.4 45.8  MCV 93.0 87.5 86.6  PLT 326 377 409*   Cardiac Enzymes: No results for input(s): "CKTOTAL", "CKMB", "CKMBINDEX", "TROPONINI" in the last 168 hours. BNP: BNP (last 3 results) Recent Labs    08/17/23 1012 10/12/23 1935 11/21/23 1715  BNP 14.6 40.1 345.0*    ProBNP (last 3 results) Recent Labs    01/09/23 1436 07/25/23 1337  PROBNP 14.0 12.0    CBG: No results  for input(s): "GLUCAP" in the last 168 hours.     Signed:  Zannie Cove MD.  [facility] 11/25/2023, 11:03 AM

## 2023-11-27 ENCOUNTER — Telehealth: Payer: Self-pay

## 2023-11-27 ENCOUNTER — Ambulatory Visit: Payer: Medicare Other | Admitting: Cardiology

## 2023-11-27 NOTE — Transitions of Care (Post Inpatient/ED Visit) (Signed)
   11/27/2023  Name: Larry Elliott MRN: [id number] DOB: February 02, 1946  Today's TOC FU Call Status: Today's TOC FU Call Status:: Unsuccessful Call (1st Attempt) Unsuccessful Call (1st Attempt) Date: 11/27/23  Attempted to reach the patient regarding the most recent Inpatient/ED visit.  Follow Up Plan: Additional outreach attempts will be made to reach the patient to complete the Transitions of Care (Post Inpatient/ED visit) call.   Wyline Mood BSN, Programmer, systems   Transitions of Care  [place] / [facility], [facility] Direct Dial Number: [phone]  Fax: [phone]

## 2023-11-28 ENCOUNTER — Telehealth: Payer: Self-pay | Admitting: *Deleted

## 2023-11-28 NOTE — Transitions of Care (Post Inpatient/ED Visit) (Signed)
   11/28/2023  Name: Larry Elliott MRN: [id number] DOB: 1946/05/25  Today's TOC FU Call Status: Today's TOC FU Call Status:: Unsuccessful Call (2nd Attempt) Unsuccessful Call (2nd Attempt) Date: 11/28/23  Attempted to reach the patient regarding the most recent Inpatient/ED visit.  Follow Up Plan: Additional outreach attempts will be made to reach the patient to complete the Transitions of Care (Post Inpatient/ED visit) call.   Cathlean Headland BSN RN [facility] Health Care Management Coordinator [email or web address] Direct Dial: [phone]  Fax: [phone] Website: [email or web address]

## 2023-11-29 ENCOUNTER — Encounter: Payer: Self-pay | Admitting: Internal Medicine

## 2023-11-29 ENCOUNTER — Telehealth: Payer: Self-pay | Admitting: *Deleted

## 2023-11-29 DIAGNOSIS — G3184 Mild cognitive impairment, so stated: Secondary | ICD-10-CM | POA: Diagnosis not present

## 2023-11-29 DIAGNOSIS — I509 Heart failure, unspecified: Secondary | ICD-10-CM | POA: Diagnosis not present

## 2023-11-29 DIAGNOSIS — R739 Hyperglycemia, unspecified: Secondary | ICD-10-CM | POA: Diagnosis not present

## 2023-11-29 DIAGNOSIS — I10 Essential (primary) hypertension: Secondary | ICD-10-CM | POA: Diagnosis not present

## 2023-11-29 DIAGNOSIS — F324 Major depressive disorder, single episode, in partial remission: Secondary | ICD-10-CM | POA: Diagnosis not present

## 2023-11-29 NOTE — Transitions of Care (Post Inpatient/ED Visit) (Signed)
   11/29/2023  Name: SAHIR TOLSON MRN: [id number] DOB: 1946/01/04  Today's TOC FU Call Status: Today's TOC FU Call Status:: Unsuccessful Call (3rd Attempt) Unsuccessful Call (3rd Attempt) Date: 11/29/23  Attempted to reach the patient regarding the most recent Inpatient/ED visit.  Follow Up Plan: No further outreach attempts will be made at this time. We have been unable to contact the patient.  Cathlean Headland BSN RN [place] [facility] Health Care Management Coordinator [email or web address] Direct Dial: [phone]  Fax: [phone] Website: [email or web address]

## 2023-11-30 LAB — QUANTIFERON-TB GOLD PLUS (RQFGPL)
QuantiFERON Mitogen Value: 9.22 [IU]/mL
QuantiFERON Nil Value: 0.06 [IU]/mL
QuantiFERON TB1 Ag Value: 0.07 [IU]/mL
QuantiFERON TB2 Ag Value: 0.07 [IU]/mL

## 2023-11-30 LAB — QUANTIFERON-TB GOLD PLUS: QuantiFERON-TB Gold Plus: NEGATIVE

## 2023-12-07 DIAGNOSIS — E559 Vitamin D deficiency, unspecified: Secondary | ICD-10-CM | POA: Diagnosis not present

## 2023-12-07 DIAGNOSIS — I10 Essential (primary) hypertension: Secondary | ICD-10-CM | POA: Diagnosis not present

## 2023-12-07 DIAGNOSIS — Z79899 Other long term (current) drug therapy: Secondary | ICD-10-CM | POA: Diagnosis not present

## 2023-12-07 DIAGNOSIS — I129 Hypertensive chronic kidney disease with stage 1 through stage 4 chronic kidney disease, or unspecified chronic kidney disease: Secondary | ICD-10-CM | POA: Diagnosis not present

## 2023-12-12 DIAGNOSIS — G4700 Insomnia, unspecified: Secondary | ICD-10-CM | POA: Diagnosis not present

## 2023-12-12 DIAGNOSIS — M1990 Unspecified osteoarthritis, unspecified site: Secondary | ICD-10-CM | POA: Diagnosis not present

## 2023-12-12 DIAGNOSIS — F329 Major depressive disorder, single episode, unspecified: Secondary | ICD-10-CM | POA: Diagnosis not present

## 2023-12-13 DIAGNOSIS — I10 Essential (primary) hypertension: Secondary | ICD-10-CM | POA: Diagnosis not present

## 2023-12-13 DIAGNOSIS — E119 Type 2 diabetes mellitus without complications: Secondary | ICD-10-CM | POA: Diagnosis not present

## 2023-12-19 DIAGNOSIS — I10 Essential (primary) hypertension: Secondary | ICD-10-CM | POA: Diagnosis not present

## 2023-12-19 DIAGNOSIS — R0602 Shortness of breath: Secondary | ICD-10-CM | POA: Diagnosis not present

## 2023-12-19 DIAGNOSIS — E119 Type 2 diabetes mellitus without complications: Secondary | ICD-10-CM | POA: Diagnosis not present

## 2023-12-20 DIAGNOSIS — R54 Age-related physical debility: Secondary | ICD-10-CM | POA: Diagnosis not present

## 2023-12-20 DIAGNOSIS — I5089 Other heart failure: Secondary | ICD-10-CM | POA: Diagnosis not present

## 2023-12-20 DIAGNOSIS — M629 Disorder of muscle, unspecified: Secondary | ICD-10-CM | POA: Diagnosis not present

## 2023-12-21 DIAGNOSIS — R54 Age-related physical debility: Secondary | ICD-10-CM | POA: Diagnosis not present

## 2023-12-21 DIAGNOSIS — I5022 Chronic systolic (congestive) heart failure: Secondary | ICD-10-CM | POA: Diagnosis not present

## 2023-12-21 DIAGNOSIS — R2689 Other abnormalities of gait and mobility: Secondary | ICD-10-CM | POA: Diagnosis not present

## 2023-12-22 DIAGNOSIS — E785 Hyperlipidemia, unspecified: Secondary | ICD-10-CM | POA: Diagnosis not present

## 2023-12-22 DIAGNOSIS — I10 Essential (primary) hypertension: Secondary | ICD-10-CM | POA: Diagnosis not present

## 2023-12-27 DIAGNOSIS — I130 Hypertensive heart and chronic kidney disease with heart failure and stage 1 through stage 4 chronic kidney disease, or unspecified chronic kidney disease: Secondary | ICD-10-CM | POA: Diagnosis not present

## 2023-12-27 DIAGNOSIS — F0390 Unspecified dementia without behavioral disturbance: Secondary | ICD-10-CM | POA: Diagnosis not present

## 2023-12-27 DIAGNOSIS — R600 Localized edema: Secondary | ICD-10-CM | POA: Diagnosis not present

## 2023-12-27 DIAGNOSIS — H9190 Unspecified hearing loss, unspecified ear: Secondary | ICD-10-CM | POA: Diagnosis not present

## 2023-12-27 DIAGNOSIS — R54 Age-related physical debility: Secondary | ICD-10-CM | POA: Diagnosis not present

## 2023-12-29 DIAGNOSIS — R2689 Other abnormalities of gait and mobility: Secondary | ICD-10-CM | POA: Diagnosis not present

## 2023-12-29 DIAGNOSIS — I5022 Chronic systolic (congestive) heart failure: Secondary | ICD-10-CM | POA: Diagnosis not present

## 2023-12-29 DIAGNOSIS — R54 Age-related physical debility: Secondary | ICD-10-CM | POA: Diagnosis not present

## 2024-01-03 DIAGNOSIS — R54 Age-related physical debility: Secondary | ICD-10-CM | POA: Diagnosis not present

## 2024-01-03 DIAGNOSIS — R2689 Other abnormalities of gait and mobility: Secondary | ICD-10-CM | POA: Diagnosis not present

## 2024-01-03 DIAGNOSIS — I5022 Chronic systolic (congestive) heart failure: Secondary | ICD-10-CM | POA: Diagnosis not present

## 2024-01-03 DIAGNOSIS — M6281 Muscle weakness (generalized): Secondary | ICD-10-CM | POA: Diagnosis not present

## 2024-01-08 DIAGNOSIS — Z03818 Encounter for observation for suspected exposure to other biological agents ruled out: Secondary | ICD-10-CM | POA: Diagnosis not present

## 2024-01-08 DIAGNOSIS — E860 Dehydration: Secondary | ICD-10-CM | POA: Diagnosis not present

## 2024-01-08 DIAGNOSIS — M6281 Muscle weakness (generalized): Secondary | ICD-10-CM | POA: Diagnosis not present

## 2024-01-08 DIAGNOSIS — A09 Infectious gastroenteritis and colitis, unspecified: Secondary | ICD-10-CM | POA: Diagnosis not present

## 2024-01-09 DIAGNOSIS — I5022 Chronic systolic (congestive) heart failure: Secondary | ICD-10-CM | POA: Diagnosis not present

## 2024-01-09 DIAGNOSIS — R54 Age-related physical debility: Secondary | ICD-10-CM | POA: Diagnosis not present

## 2024-01-09 DIAGNOSIS — M6281 Muscle weakness (generalized): Secondary | ICD-10-CM | POA: Diagnosis not present

## 2024-01-09 DIAGNOSIS — R2689 Other abnormalities of gait and mobility: Secondary | ICD-10-CM | POA: Diagnosis not present

## 2024-01-10 DIAGNOSIS — M6281 Muscle weakness (generalized): Secondary | ICD-10-CM | POA: Diagnosis not present

## 2024-01-10 DIAGNOSIS — E781 Pure hyperglyceridemia: Secondary | ICD-10-CM | POA: Diagnosis not present

## 2024-01-10 DIAGNOSIS — I5022 Chronic systolic (congestive) heart failure: Secondary | ICD-10-CM | POA: Diagnosis not present

## 2024-01-10 DIAGNOSIS — R2689 Other abnormalities of gait and mobility: Secondary | ICD-10-CM | POA: Diagnosis not present

## 2024-01-10 DIAGNOSIS — M542 Cervicalgia: Secondary | ICD-10-CM | POA: Diagnosis not present

## 2024-01-10 DIAGNOSIS — I5030 Unspecified diastolic (congestive) heart failure: Secondary | ICD-10-CM | POA: Diagnosis not present

## 2024-01-10 DIAGNOSIS — R54 Age-related physical debility: Secondary | ICD-10-CM | POA: Diagnosis not present

## 2024-01-16 DIAGNOSIS — R54 Age-related physical debility: Secondary | ICD-10-CM | POA: Diagnosis not present

## 2024-01-16 DIAGNOSIS — R2689 Other abnormalities of gait and mobility: Secondary | ICD-10-CM | POA: Diagnosis not present

## 2024-01-16 DIAGNOSIS — I5022 Chronic systolic (congestive) heart failure: Secondary | ICD-10-CM | POA: Diagnosis not present

## 2024-01-18 DIAGNOSIS — I5022 Chronic systolic (congestive) heart failure: Secondary | ICD-10-CM | POA: Diagnosis not present

## 2024-01-18 DIAGNOSIS — R2689 Other abnormalities of gait and mobility: Secondary | ICD-10-CM | POA: Diagnosis not present

## 2024-01-18 DIAGNOSIS — R54 Age-related physical debility: Secondary | ICD-10-CM | POA: Diagnosis not present

## 2024-01-19 DIAGNOSIS — E782 Mixed hyperlipidemia: Secondary | ICD-10-CM | POA: Diagnosis not present

## 2024-01-19 DIAGNOSIS — M47812 Spondylosis without myelopathy or radiculopathy, cervical region: Secondary | ICD-10-CM | POA: Diagnosis not present

## 2024-01-23 DIAGNOSIS — B353 Tinea pedis: Secondary | ICD-10-CM | POA: Diagnosis not present

## 2024-01-23 DIAGNOSIS — I739 Peripheral vascular disease, unspecified: Secondary | ICD-10-CM | POA: Diagnosis not present

## 2024-01-23 DIAGNOSIS — L84 Corns and callosities: Secondary | ICD-10-CM | POA: Diagnosis not present

## 2024-01-23 DIAGNOSIS — B351 Tinea unguium: Secondary | ICD-10-CM | POA: Diagnosis not present

## 2024-01-24 DIAGNOSIS — R54 Age-related physical debility: Secondary | ICD-10-CM | POA: Diagnosis not present

## 2024-01-24 DIAGNOSIS — I5022 Chronic systolic (congestive) heart failure: Secondary | ICD-10-CM | POA: Diagnosis not present

## 2024-01-24 DIAGNOSIS — R269 Unspecified abnormalities of gait and mobility: Secondary | ICD-10-CM | POA: Diagnosis not present

## 2024-01-25 DIAGNOSIS — R54 Age-related physical debility: Secondary | ICD-10-CM | POA: Diagnosis not present

## 2024-01-25 DIAGNOSIS — R269 Unspecified abnormalities of gait and mobility: Secondary | ICD-10-CM | POA: Diagnosis not present

## 2024-01-25 DIAGNOSIS — I5022 Chronic systolic (congestive) heart failure: Secondary | ICD-10-CM | POA: Diagnosis not present

## 2024-01-29 DIAGNOSIS — E782 Mixed hyperlipidemia: Secondary | ICD-10-CM | POA: Diagnosis not present

## 2024-01-29 DIAGNOSIS — G4700 Insomnia, unspecified: Secondary | ICD-10-CM | POA: Diagnosis not present

## 2024-01-29 DIAGNOSIS — D519 Vitamin B12 deficiency anemia, unspecified: Secondary | ICD-10-CM | POA: Diagnosis not present

## 2024-01-31 DIAGNOSIS — I5022 Chronic systolic (congestive) heart failure: Secondary | ICD-10-CM | POA: Diagnosis not present

## 2024-01-31 DIAGNOSIS — R269 Unspecified abnormalities of gait and mobility: Secondary | ICD-10-CM | POA: Diagnosis not present

## 2024-01-31 DIAGNOSIS — R54 Age-related physical debility: Secondary | ICD-10-CM | POA: Diagnosis not present

## 2024-02-01 DIAGNOSIS — I5022 Chronic systolic (congestive) heart failure: Secondary | ICD-10-CM | POA: Diagnosis not present

## 2024-02-01 DIAGNOSIS — R54 Age-related physical debility: Secondary | ICD-10-CM | POA: Diagnosis not present

## 2024-02-01 DIAGNOSIS — R269 Unspecified abnormalities of gait and mobility: Secondary | ICD-10-CM | POA: Diagnosis not present

## 2024-02-06 DIAGNOSIS — R54 Age-related physical debility: Secondary | ICD-10-CM | POA: Diagnosis not present

## 2024-02-06 DIAGNOSIS — I5022 Chronic systolic (congestive) heart failure: Secondary | ICD-10-CM | POA: Diagnosis not present

## 2024-02-06 DIAGNOSIS — R269 Unspecified abnormalities of gait and mobility: Secondary | ICD-10-CM | POA: Diagnosis not present

## 2024-02-07 DIAGNOSIS — M47812 Spondylosis without myelopathy or radiculopathy, cervical region: Secondary | ICD-10-CM | POA: Diagnosis not present

## 2024-02-07 DIAGNOSIS — G609 Hereditary and idiopathic neuropathy, unspecified: Secondary | ICD-10-CM | POA: Diagnosis not present

## 2024-02-07 DIAGNOSIS — G3184 Mild cognitive impairment, so stated: Secondary | ICD-10-CM | POA: Diagnosis not present

## 2024-02-07 DIAGNOSIS — H109 Unspecified conjunctivitis: Secondary | ICD-10-CM | POA: Diagnosis not present

## 2024-02-08 DIAGNOSIS — I5022 Chronic systolic (congestive) heart failure: Secondary | ICD-10-CM | POA: Diagnosis not present

## 2024-02-08 DIAGNOSIS — R269 Unspecified abnormalities of gait and mobility: Secondary | ICD-10-CM | POA: Diagnosis not present

## 2024-02-08 DIAGNOSIS — R54 Age-related physical debility: Secondary | ICD-10-CM | POA: Diagnosis not present

## 2024-02-13 DIAGNOSIS — I5022 Chronic systolic (congestive) heart failure: Secondary | ICD-10-CM | POA: Diagnosis not present

## 2024-02-13 DIAGNOSIS — R54 Age-related physical debility: Secondary | ICD-10-CM | POA: Diagnosis not present

## 2024-02-13 DIAGNOSIS — E782 Mixed hyperlipidemia: Secondary | ICD-10-CM | POA: Diagnosis not present

## 2024-02-13 DIAGNOSIS — M159 Polyosteoarthritis, unspecified: Secondary | ICD-10-CM | POA: Diagnosis not present

## 2024-02-13 DIAGNOSIS — R269 Unspecified abnormalities of gait and mobility: Secondary | ICD-10-CM | POA: Diagnosis not present

## 2024-02-15 DIAGNOSIS — R54 Age-related physical debility: Secondary | ICD-10-CM | POA: Diagnosis not present

## 2024-02-15 DIAGNOSIS — R269 Unspecified abnormalities of gait and mobility: Secondary | ICD-10-CM | POA: Diagnosis not present

## 2024-02-15 DIAGNOSIS — I5022 Chronic systolic (congestive) heart failure: Secondary | ICD-10-CM | POA: Diagnosis not present

## 2024-02-20 DIAGNOSIS — I5022 Chronic systolic (congestive) heart failure: Secondary | ICD-10-CM | POA: Diagnosis not present

## 2024-02-20 DIAGNOSIS — R269 Unspecified abnormalities of gait and mobility: Secondary | ICD-10-CM | POA: Diagnosis not present

## 2024-02-20 DIAGNOSIS — R54 Age-related physical debility: Secondary | ICD-10-CM | POA: Diagnosis not present

## 2024-02-22 DIAGNOSIS — I5022 Chronic systolic (congestive) heart failure: Secondary | ICD-10-CM | POA: Diagnosis not present

## 2024-02-22 DIAGNOSIS — R269 Unspecified abnormalities of gait and mobility: Secondary | ICD-10-CM | POA: Diagnosis not present

## 2024-02-22 DIAGNOSIS — R54 Age-related physical debility: Secondary | ICD-10-CM | POA: Diagnosis not present

## 2024-02-26 DIAGNOSIS — G4700 Insomnia, unspecified: Secondary | ICD-10-CM | POA: Diagnosis not present

## 2024-02-26 DIAGNOSIS — M25551 Pain in right hip: Secondary | ICD-10-CM | POA: Diagnosis not present

## 2024-02-26 DIAGNOSIS — B372 Candidiasis of skin and nail: Secondary | ICD-10-CM | POA: Diagnosis not present

## 2024-03-01 DIAGNOSIS — I5022 Chronic systolic (congestive) heart failure: Secondary | ICD-10-CM | POA: Diagnosis not present

## 2024-03-01 DIAGNOSIS — R54 Age-related physical debility: Secondary | ICD-10-CM | POA: Diagnosis not present

## 2024-03-01 DIAGNOSIS — R269 Unspecified abnormalities of gait and mobility: Secondary | ICD-10-CM | POA: Diagnosis not present

## 2024-03-06 DIAGNOSIS — G609 Hereditary and idiopathic neuropathy, unspecified: Secondary | ICD-10-CM | POA: Diagnosis not present

## 2024-03-06 DIAGNOSIS — M159 Polyosteoarthritis, unspecified: Secondary | ICD-10-CM | POA: Diagnosis not present

## 2024-03-06 DIAGNOSIS — M25551 Pain in right hip: Secondary | ICD-10-CM | POA: Diagnosis not present

## 2024-03-12 DIAGNOSIS — R269 Unspecified abnormalities of gait and mobility: Secondary | ICD-10-CM | POA: Diagnosis not present

## 2024-03-12 DIAGNOSIS — R54 Age-related physical debility: Secondary | ICD-10-CM | POA: Diagnosis not present

## 2024-03-12 DIAGNOSIS — I5022 Chronic systolic (congestive) heart failure: Secondary | ICD-10-CM | POA: Diagnosis not present

## 2024-03-14 DIAGNOSIS — R54 Age-related physical debility: Secondary | ICD-10-CM | POA: Diagnosis not present

## 2024-03-14 DIAGNOSIS — I5022 Chronic systolic (congestive) heart failure: Secondary | ICD-10-CM | POA: Diagnosis not present

## 2024-03-14 DIAGNOSIS — R269 Unspecified abnormalities of gait and mobility: Secondary | ICD-10-CM | POA: Diagnosis not present

## 2024-03-19 DIAGNOSIS — I5022 Chronic systolic (congestive) heart failure: Secondary | ICD-10-CM | POA: Diagnosis not present

## 2024-03-19 DIAGNOSIS — R269 Unspecified abnormalities of gait and mobility: Secondary | ICD-10-CM | POA: Diagnosis not present

## 2024-03-19 DIAGNOSIS — R54 Age-related physical debility: Secondary | ICD-10-CM | POA: Diagnosis not present

## 2024-03-20 DIAGNOSIS — R609 Edema, unspecified: Secondary | ICD-10-CM | POA: Diagnosis not present

## 2024-03-20 DIAGNOSIS — Z79899 Other long term (current) drug therapy: Secondary | ICD-10-CM | POA: Diagnosis not present

## 2024-03-20 DIAGNOSIS — I5030 Unspecified diastolic (congestive) heart failure: Secondary | ICD-10-CM | POA: Diagnosis not present

## 2024-03-20 DIAGNOSIS — L258 Unspecified contact dermatitis due to other agents: Secondary | ICD-10-CM | POA: Diagnosis not present

## 2024-03-20 DIAGNOSIS — I872 Venous insufficiency (chronic) (peripheral): Secondary | ICD-10-CM | POA: Diagnosis not present

## 2024-03-21 ENCOUNTER — Encounter: Payer: Self-pay | Admitting: Internal Medicine

## 2024-03-21 ENCOUNTER — Encounter (HOSPITAL_BASED_OUTPATIENT_CLINIC_OR_DEPARTMENT_OTHER): Payer: Self-pay | Admitting: Cardiology

## 2024-03-22 DIAGNOSIS — G603 Idiopathic progressive neuropathy: Secondary | ICD-10-CM | POA: Diagnosis not present

## 2024-03-22 DIAGNOSIS — M1611 Unilateral primary osteoarthritis, right hip: Secondary | ICD-10-CM | POA: Diagnosis not present

## 2024-03-22 NOTE — Telephone Encounter (Signed)
 Yes he is OK to undergo dental work, we can sign a form if needed

## 2024-03-25 DIAGNOSIS — R269 Unspecified abnormalities of gait and mobility: Secondary | ICD-10-CM | POA: Diagnosis not present

## 2024-03-25 DIAGNOSIS — I739 Peripheral vascular disease, unspecified: Secondary | ICD-10-CM | POA: Diagnosis not present

## 2024-03-25 DIAGNOSIS — R54 Age-related physical debility: Secondary | ICD-10-CM | POA: Diagnosis not present

## 2024-03-25 DIAGNOSIS — I5022 Chronic systolic (congestive) heart failure: Secondary | ICD-10-CM | POA: Diagnosis not present

## 2024-03-25 DIAGNOSIS — D519 Vitamin B12 deficiency anemia, unspecified: Secondary | ICD-10-CM | POA: Diagnosis not present

## 2024-03-25 DIAGNOSIS — E782 Mixed hyperlipidemia: Secondary | ICD-10-CM | POA: Diagnosis not present

## 2024-03-28 DIAGNOSIS — R269 Unspecified abnormalities of gait and mobility: Secondary | ICD-10-CM | POA: Diagnosis not present

## 2024-03-28 DIAGNOSIS — I5022 Chronic systolic (congestive) heart failure: Secondary | ICD-10-CM | POA: Diagnosis not present

## 2024-03-28 DIAGNOSIS — R54 Age-related physical debility: Secondary | ICD-10-CM | POA: Diagnosis not present

## 2024-04-02 DIAGNOSIS — I5022 Chronic systolic (congestive) heart failure: Secondary | ICD-10-CM | POA: Diagnosis not present

## 2024-04-02 DIAGNOSIS — R269 Unspecified abnormalities of gait and mobility: Secondary | ICD-10-CM | POA: Diagnosis not present

## 2024-04-02 DIAGNOSIS — R54 Age-related physical debility: Secondary | ICD-10-CM | POA: Diagnosis not present

## 2024-04-03 ENCOUNTER — Telehealth: Payer: Self-pay

## 2024-04-03 DIAGNOSIS — G63 Polyneuropathy in diseases classified elsewhere: Secondary | ICD-10-CM | POA: Diagnosis not present

## 2024-04-03 DIAGNOSIS — G603 Idiopathic progressive neuropathy: Secondary | ICD-10-CM | POA: Diagnosis not present

## 2024-04-03 DIAGNOSIS — R4189 Other symptoms and signs involving cognitive functions and awareness: Secondary | ICD-10-CM | POA: Diagnosis not present

## 2024-04-03 DIAGNOSIS — M1611 Unilateral primary osteoarthritis, right hip: Secondary | ICD-10-CM | POA: Diagnosis not present

## 2024-04-03 DIAGNOSIS — L03115 Cellulitis of right lower limb: Secondary | ICD-10-CM | POA: Diagnosis not present

## 2024-04-03 NOTE — Telephone Encounter (Signed)
   Name: Larry Elliott  DOB: 01/28/1946  MRN: [id number]   Primary Cardiologist: Wendie Hamburg, MD  Chart reviewed as part of pre-operative protocol coverage.   Per Dr. Alda Amas: Yes he is OK to undergo dental work.  I will route this recommendation to the requesting party via Epic fax function and remove from pre-op pool. Please call with questions.  Warren Haber Prather Failla, PA 04/03/2024, 1:26 PM

## 2024-04-03 NOTE — Telephone Encounter (Signed)
   Pre-operative Risk Assessment    Patient Name: Larry Elliott  DOB: 05/22/1946 MRN: [id number]   Date of last office visit: 08/31/23 Carson Clara, MD Date of next office visit: NONE  Request for Surgical Clearance    Procedure:  Dental Extraction - Amount of Teeth to be Pulled:  10 AND SRP (DEEP CLEANING, FILLINGS, CROWNS  Date of Surgery:  Clearance TBD                                Surgeon:  NOT INDICATED Surgeon's Group or Practice Name:  [facility] Phone number:  [phone] Fax number:  [phone]   Type of Clearance Requested:   - Medical    Type of Anesthesia:  Not Indicated   Additional requests/questions:    Signed, Collin Deal   04/03/2024, 1:10 PM

## 2024-04-10 ENCOUNTER — Telehealth: Payer: Self-pay | Admitting: Cardiology

## 2024-04-10 DIAGNOSIS — I5022 Chronic systolic (congestive) heart failure: Secondary | ICD-10-CM | POA: Diagnosis not present

## 2024-04-10 DIAGNOSIS — R54 Age-related physical debility: Secondary | ICD-10-CM | POA: Diagnosis not present

## 2024-04-10 DIAGNOSIS — I739 Peripheral vascular disease, unspecified: Secondary | ICD-10-CM | POA: Diagnosis not present

## 2024-04-10 DIAGNOSIS — G63 Polyneuropathy in diseases classified elsewhere: Secondary | ICD-10-CM | POA: Diagnosis not present

## 2024-04-10 DIAGNOSIS — R609 Edema, unspecified: Secondary | ICD-10-CM | POA: Diagnosis not present

## 2024-04-10 DIAGNOSIS — R269 Unspecified abnormalities of gait and mobility: Secondary | ICD-10-CM | POA: Diagnosis not present

## 2024-04-10 DIAGNOSIS — L03115 Cellulitis of right lower limb: Secondary | ICD-10-CM | POA: Diagnosis not present

## 2024-04-10 DIAGNOSIS — G603 Idiopathic progressive neuropathy: Secondary | ICD-10-CM | POA: Diagnosis not present

## 2024-04-10 DIAGNOSIS — R4189 Other symptoms and signs involving cognitive functions and awareness: Secondary | ICD-10-CM | POA: Diagnosis not present

## 2024-04-10 DIAGNOSIS — I5030 Unspecified diastolic (congestive) heart failure: Secondary | ICD-10-CM | POA: Diagnosis not present

## 2024-04-10 DIAGNOSIS — L258 Unspecified contact dermatitis due to other agents: Secondary | ICD-10-CM | POA: Diagnosis not present

## 2024-04-10 DIAGNOSIS — D519 Vitamin B12 deficiency anemia, unspecified: Secondary | ICD-10-CM | POA: Diagnosis not present

## 2024-04-10 DIAGNOSIS — Z79899 Other long term (current) drug therapy: Secondary | ICD-10-CM | POA: Diagnosis not present

## 2024-04-10 DIAGNOSIS — M1611 Unilateral primary osteoarthritis, right hip: Secondary | ICD-10-CM | POA: Diagnosis not present

## 2024-04-10 DIAGNOSIS — E782 Mixed hyperlipidemia: Secondary | ICD-10-CM | POA: Diagnosis not present

## 2024-04-10 NOTE — Telephone Encounter (Signed)
     04/10/24  2:38 PM Note I left vm for the pt to call back and let tell me if he wants to pick up a copy of the clearance or if ok to send to [facility] CHART for him.    I then called and s/w the pt's daughter Sherrlyn Dolores [facility]). Sherrlyn Dolores said if I could put this into MY CHART that will be great and she will just print and bring to the DDS office.       04/10/24  2:36 PM You contacted Mooring,Tracy (Emergency Contact)    04/10/24  2:34 PM You attempted to contact Trudie Fuse (Left Message)    04/10/24  1:25 PM Shela Derby routed this conversation to Cv Div Preop Callback  (Selected Message)  Shela Derby SW   04/10/24  1:25 PM Note Follow Up:           Daughter calling about the status of patient's clearance. She said the dentist office said she needed to bring a copy of the clearance also..        04/10/24  1:22 PM Mooring,Tracy (Emergency Contact) contacted Shela Derby

## 2024-04-10 NOTE — Telephone Encounter (Signed)
 I left vm for the pt to call back and let tell me if he wants to pick up a copy of the clearance or if ok to send to [facility] CHART for him.   I then called and s/w the pt's daughter Sherrlyn Dolores [facility]). Sherrlyn Dolores said if I could put this into MY CHART that will be great and she will just print and bring to the DDS office.

## 2024-04-10 NOTE — Telephone Encounter (Signed)
 Follow Up:      Daughter calling about the status of patient's clearance. She said the dentist office said she needed to bring a copy of the clearance also.Aaron Aas

## 2024-04-17 DIAGNOSIS — L03115 Cellulitis of right lower limb: Secondary | ICD-10-CM | POA: Diagnosis not present

## 2024-04-17 DIAGNOSIS — R54 Age-related physical debility: Secondary | ICD-10-CM | POA: Diagnosis not present

## 2024-04-17 DIAGNOSIS — I739 Peripheral vascular disease, unspecified: Secondary | ICD-10-CM | POA: Diagnosis not present

## 2024-04-17 DIAGNOSIS — I5022 Chronic systolic (congestive) heart failure: Secondary | ICD-10-CM | POA: Diagnosis not present

## 2024-04-17 DIAGNOSIS — B372 Candidiasis of skin and nail: Secondary | ICD-10-CM | POA: Diagnosis not present

## 2024-04-17 DIAGNOSIS — R269 Unspecified abnormalities of gait and mobility: Secondary | ICD-10-CM | POA: Diagnosis not present

## 2024-04-18 DIAGNOSIS — R269 Unspecified abnormalities of gait and mobility: Secondary | ICD-10-CM | POA: Diagnosis not present

## 2024-04-18 DIAGNOSIS — R54 Age-related physical debility: Secondary | ICD-10-CM | POA: Diagnosis not present

## 2024-04-18 DIAGNOSIS — I5022 Chronic systolic (congestive) heart failure: Secondary | ICD-10-CM | POA: Diagnosis not present

## 2024-04-22 DIAGNOSIS — I509 Heart failure, unspecified: Secondary | ICD-10-CM | POA: Diagnosis not present

## 2024-04-22 DIAGNOSIS — R7303 Prediabetes: Secondary | ICD-10-CM | POA: Diagnosis not present

## 2024-04-22 DIAGNOSIS — F0390 Unspecified dementia without behavioral disturbance: Secondary | ICD-10-CM | POA: Diagnosis not present

## 2024-04-22 DIAGNOSIS — G8921 Chronic pain due to trauma: Secondary | ICD-10-CM | POA: Diagnosis not present

## 2024-04-22 DIAGNOSIS — F32A Depression, unspecified: Secondary | ICD-10-CM | POA: Diagnosis not present

## 2024-04-22 DIAGNOSIS — I739 Peripheral vascular disease, unspecified: Secondary | ICD-10-CM | POA: Diagnosis not present

## 2024-04-24 DIAGNOSIS — R54 Age-related physical debility: Secondary | ICD-10-CM | POA: Diagnosis not present

## 2024-04-24 DIAGNOSIS — I5022 Chronic systolic (congestive) heart failure: Secondary | ICD-10-CM | POA: Diagnosis not present

## 2024-04-24 DIAGNOSIS — R269 Unspecified abnormalities of gait and mobility: Secondary | ICD-10-CM | POA: Diagnosis not present

## 2024-04-29 DIAGNOSIS — R54 Age-related physical debility: Secondary | ICD-10-CM | POA: Diagnosis not present

## 2024-04-29 DIAGNOSIS — I5022 Chronic systolic (congestive) heart failure: Secondary | ICD-10-CM | POA: Diagnosis not present

## 2024-04-29 DIAGNOSIS — R269 Unspecified abnormalities of gait and mobility: Secondary | ICD-10-CM | POA: Diagnosis not present

## 2024-05-01 DIAGNOSIS — S81801D Unspecified open wound, right lower leg, subsequent encounter: Secondary | ICD-10-CM | POA: Diagnosis not present

## 2024-05-01 DIAGNOSIS — G63 Polyneuropathy in diseases classified elsewhere: Secondary | ICD-10-CM | POA: Diagnosis not present

## 2024-05-01 DIAGNOSIS — M159 Polyosteoarthritis, unspecified: Secondary | ICD-10-CM | POA: Diagnosis not present

## 2024-05-01 DIAGNOSIS — M545 Low back pain, unspecified: Secondary | ICD-10-CM | POA: Diagnosis not present

## 2024-05-03 DIAGNOSIS — M545 Low back pain, unspecified: Secondary | ICD-10-CM | POA: Diagnosis not present

## 2024-05-03 DIAGNOSIS — I110 Hypertensive heart disease with heart failure: Secondary | ICD-10-CM | POA: Diagnosis not present

## 2024-05-03 DIAGNOSIS — Z6834 Body mass index (BMI) 34.0-34.9, adult: Secondary | ICD-10-CM | POA: Diagnosis not present

## 2024-05-03 DIAGNOSIS — R54 Age-related physical debility: Secondary | ICD-10-CM | POA: Diagnosis not present

## 2024-05-03 DIAGNOSIS — G3184 Mild cognitive impairment, so stated: Secondary | ICD-10-CM | POA: Diagnosis not present

## 2024-05-03 DIAGNOSIS — R7303 Prediabetes: Secondary | ICD-10-CM | POA: Diagnosis not present

## 2024-05-03 DIAGNOSIS — G8929 Other chronic pain: Secondary | ICD-10-CM | POA: Diagnosis not present

## 2024-05-03 DIAGNOSIS — L89892 Pressure ulcer of other site, stage 2: Secondary | ICD-10-CM | POA: Diagnosis not present

## 2024-05-03 DIAGNOSIS — M159 Polyosteoarthritis, unspecified: Secondary | ICD-10-CM | POA: Diagnosis not present

## 2024-05-03 DIAGNOSIS — I5022 Chronic systolic (congestive) heart failure: Secondary | ICD-10-CM | POA: Diagnosis not present

## 2024-05-03 DIAGNOSIS — F419 Anxiety disorder, unspecified: Secondary | ICD-10-CM | POA: Diagnosis not present

## 2024-05-03 DIAGNOSIS — I739 Peripheral vascular disease, unspecified: Secondary | ICD-10-CM | POA: Diagnosis not present

## 2024-05-03 DIAGNOSIS — I5030 Unspecified diastolic (congestive) heart failure: Secondary | ICD-10-CM | POA: Diagnosis not present

## 2024-05-03 DIAGNOSIS — F32A Depression, unspecified: Secondary | ICD-10-CM | POA: Diagnosis not present

## 2024-05-03 DIAGNOSIS — E669 Obesity, unspecified: Secondary | ICD-10-CM | POA: Diagnosis not present

## 2024-05-03 DIAGNOSIS — R269 Unspecified abnormalities of gait and mobility: Secondary | ICD-10-CM | POA: Diagnosis not present

## 2024-05-03 DIAGNOSIS — G629 Polyneuropathy, unspecified: Secondary | ICD-10-CM | POA: Diagnosis not present

## 2024-05-03 DIAGNOSIS — Z7984 Long term (current) use of oral hypoglycemic drugs: Secondary | ICD-10-CM | POA: Diagnosis not present

## 2024-05-03 DIAGNOSIS — G4700 Insomnia, unspecified: Secondary | ICD-10-CM | POA: Diagnosis not present

## 2024-05-07 DIAGNOSIS — I5022 Chronic systolic (congestive) heart failure: Secondary | ICD-10-CM | POA: Diagnosis not present

## 2024-05-07 DIAGNOSIS — R269 Unspecified abnormalities of gait and mobility: Secondary | ICD-10-CM | POA: Diagnosis not present

## 2024-05-07 DIAGNOSIS — R54 Age-related physical debility: Secondary | ICD-10-CM | POA: Diagnosis not present

## 2024-05-08 DIAGNOSIS — G629 Polyneuropathy, unspecified: Secondary | ICD-10-CM | POA: Diagnosis not present

## 2024-05-08 DIAGNOSIS — I5022 Chronic systolic (congestive) heart failure: Secondary | ICD-10-CM | POA: Diagnosis not present

## 2024-05-08 DIAGNOSIS — I110 Hypertensive heart disease with heart failure: Secondary | ICD-10-CM | POA: Diagnosis not present

## 2024-05-08 DIAGNOSIS — R269 Unspecified abnormalities of gait and mobility: Secondary | ICD-10-CM | POA: Diagnosis not present

## 2024-05-08 DIAGNOSIS — I739 Peripheral vascular disease, unspecified: Secondary | ICD-10-CM | POA: Diagnosis not present

## 2024-05-08 DIAGNOSIS — I5030 Unspecified diastolic (congestive) heart failure: Secondary | ICD-10-CM | POA: Diagnosis not present

## 2024-05-08 DIAGNOSIS — L89892 Pressure ulcer of other site, stage 2: Secondary | ICD-10-CM | POA: Diagnosis not present

## 2024-05-08 DIAGNOSIS — R54 Age-related physical debility: Secondary | ICD-10-CM | POA: Diagnosis not present

## 2024-05-08 DIAGNOSIS — G3184 Mild cognitive impairment, so stated: Secondary | ICD-10-CM | POA: Diagnosis not present

## 2024-05-10 DIAGNOSIS — I739 Peripheral vascular disease, unspecified: Secondary | ICD-10-CM | POA: Diagnosis not present

## 2024-05-10 DIAGNOSIS — I5030 Unspecified diastolic (congestive) heart failure: Secondary | ICD-10-CM | POA: Diagnosis not present

## 2024-05-10 DIAGNOSIS — G3184 Mild cognitive impairment, so stated: Secondary | ICD-10-CM | POA: Diagnosis not present

## 2024-05-10 DIAGNOSIS — I110 Hypertensive heart disease with heart failure: Secondary | ICD-10-CM | POA: Diagnosis not present

## 2024-05-10 DIAGNOSIS — G629 Polyneuropathy, unspecified: Secondary | ICD-10-CM | POA: Diagnosis not present

## 2024-05-10 DIAGNOSIS — L89892 Pressure ulcer of other site, stage 2: Secondary | ICD-10-CM | POA: Diagnosis not present

## 2024-05-13 DIAGNOSIS — R54 Age-related physical debility: Secondary | ICD-10-CM | POA: Diagnosis not present

## 2024-05-13 DIAGNOSIS — I5022 Chronic systolic (congestive) heart failure: Secondary | ICD-10-CM | POA: Diagnosis not present

## 2024-05-13 DIAGNOSIS — R269 Unspecified abnormalities of gait and mobility: Secondary | ICD-10-CM | POA: Diagnosis not present

## 2024-05-15 DIAGNOSIS — I5022 Chronic systolic (congestive) heart failure: Secondary | ICD-10-CM | POA: Diagnosis not present

## 2024-05-15 DIAGNOSIS — M1990 Unspecified osteoarthritis, unspecified site: Secondary | ICD-10-CM | POA: Diagnosis not present

## 2024-05-15 DIAGNOSIS — R54 Age-related physical debility: Secondary | ICD-10-CM | POA: Diagnosis not present

## 2024-05-15 DIAGNOSIS — I509 Heart failure, unspecified: Secondary | ICD-10-CM | POA: Diagnosis not present

## 2024-05-15 DIAGNOSIS — R269 Unspecified abnormalities of gait and mobility: Secondary | ICD-10-CM | POA: Diagnosis not present

## 2024-05-16 DIAGNOSIS — G8921 Chronic pain due to trauma: Secondary | ICD-10-CM | POA: Diagnosis not present

## 2024-05-16 DIAGNOSIS — F32A Depression, unspecified: Secondary | ICD-10-CM | POA: Diagnosis not present

## 2024-05-16 DIAGNOSIS — R7303 Prediabetes: Secondary | ICD-10-CM | POA: Diagnosis not present

## 2024-05-16 DIAGNOSIS — F0390 Unspecified dementia without behavioral disturbance: Secondary | ICD-10-CM | POA: Diagnosis not present

## 2024-05-20 DIAGNOSIS — R269 Unspecified abnormalities of gait and mobility: Secondary | ICD-10-CM | POA: Diagnosis not present

## 2024-05-20 DIAGNOSIS — R54 Age-related physical debility: Secondary | ICD-10-CM | POA: Diagnosis not present

## 2024-05-20 DIAGNOSIS — I5022 Chronic systolic (congestive) heart failure: Secondary | ICD-10-CM | POA: Diagnosis not present

## 2024-05-21 DIAGNOSIS — G3184 Mild cognitive impairment, so stated: Secondary | ICD-10-CM | POA: Diagnosis not present

## 2024-05-21 DIAGNOSIS — L89892 Pressure ulcer of other site, stage 2: Secondary | ICD-10-CM | POA: Diagnosis not present

## 2024-05-21 DIAGNOSIS — I110 Hypertensive heart disease with heart failure: Secondary | ICD-10-CM | POA: Diagnosis not present

## 2024-05-22 DIAGNOSIS — R269 Unspecified abnormalities of gait and mobility: Secondary | ICD-10-CM | POA: Diagnosis not present

## 2024-05-22 DIAGNOSIS — R54 Age-related physical debility: Secondary | ICD-10-CM | POA: Diagnosis not present

## 2024-05-22 DIAGNOSIS — I5022 Chronic systolic (congestive) heart failure: Secondary | ICD-10-CM | POA: Diagnosis not present

## 2024-05-27 DIAGNOSIS — R54 Age-related physical debility: Secondary | ICD-10-CM | POA: Diagnosis not present

## 2024-05-27 DIAGNOSIS — I5022 Chronic systolic (congestive) heart failure: Secondary | ICD-10-CM | POA: Diagnosis not present

## 2024-05-27 DIAGNOSIS — R269 Unspecified abnormalities of gait and mobility: Secondary | ICD-10-CM | POA: Diagnosis not present

## 2024-05-29 DIAGNOSIS — R269 Unspecified abnormalities of gait and mobility: Secondary | ICD-10-CM | POA: Diagnosis not present

## 2024-05-29 DIAGNOSIS — I5022 Chronic systolic (congestive) heart failure: Secondary | ICD-10-CM | POA: Diagnosis not present

## 2024-05-29 DIAGNOSIS — R54 Age-related physical debility: Secondary | ICD-10-CM | POA: Diagnosis not present

## 2024-06-03 DIAGNOSIS — R54 Age-related physical debility: Secondary | ICD-10-CM | POA: Diagnosis not present

## 2024-06-03 DIAGNOSIS — I5022 Chronic systolic (congestive) heart failure: Secondary | ICD-10-CM | POA: Diagnosis not present

## 2024-06-03 DIAGNOSIS — R269 Unspecified abnormalities of gait and mobility: Secondary | ICD-10-CM | POA: Diagnosis not present

## 2024-06-05 DIAGNOSIS — I5022 Chronic systolic (congestive) heart failure: Secondary | ICD-10-CM | POA: Diagnosis not present

## 2024-06-05 DIAGNOSIS — G63 Polyneuropathy in diseases classified elsewhere: Secondary | ICD-10-CM | POA: Diagnosis not present

## 2024-06-05 DIAGNOSIS — G4700 Insomnia, unspecified: Secondary | ICD-10-CM | POA: Diagnosis not present

## 2024-06-05 DIAGNOSIS — I739 Peripheral vascular disease, unspecified: Secondary | ICD-10-CM | POA: Diagnosis not present

## 2024-06-05 DIAGNOSIS — R54 Age-related physical debility: Secondary | ICD-10-CM | POA: Diagnosis not present

## 2024-06-05 DIAGNOSIS — R269 Unspecified abnormalities of gait and mobility: Secondary | ICD-10-CM | POA: Diagnosis not present

## 2024-06-05 DIAGNOSIS — M545 Low back pain, unspecified: Secondary | ICD-10-CM | POA: Diagnosis not present

## 2024-06-05 DIAGNOSIS — R609 Edema, unspecified: Secondary | ICD-10-CM | POA: Diagnosis not present

## 2024-06-05 DIAGNOSIS — M159 Polyosteoarthritis, unspecified: Secondary | ICD-10-CM | POA: Diagnosis not present

## 2024-06-10 DIAGNOSIS — I5022 Chronic systolic (congestive) heart failure: Secondary | ICD-10-CM | POA: Diagnosis not present

## 2024-06-10 DIAGNOSIS — R269 Unspecified abnormalities of gait and mobility: Secondary | ICD-10-CM | POA: Diagnosis not present

## 2024-06-10 DIAGNOSIS — R54 Age-related physical debility: Secondary | ICD-10-CM | POA: Diagnosis not present

## 2024-06-12 DIAGNOSIS — R54 Age-related physical debility: Secondary | ICD-10-CM | POA: Diagnosis not present

## 2024-06-12 DIAGNOSIS — I5022 Chronic systolic (congestive) heart failure: Secondary | ICD-10-CM | POA: Diagnosis not present

## 2024-06-12 DIAGNOSIS — R269 Unspecified abnormalities of gait and mobility: Secondary | ICD-10-CM | POA: Diagnosis not present

## 2024-06-13 DIAGNOSIS — G3184 Mild cognitive impairment, so stated: Secondary | ICD-10-CM | POA: Diagnosis not present

## 2024-06-13 DIAGNOSIS — I5033 Acute on chronic diastolic (congestive) heart failure: Secondary | ICD-10-CM | POA: Diagnosis not present

## 2024-06-13 DIAGNOSIS — E559 Vitamin D deficiency, unspecified: Secondary | ICD-10-CM | POA: Diagnosis not present

## 2024-06-13 DIAGNOSIS — F32A Depression, unspecified: Secondary | ICD-10-CM | POA: Diagnosis not present

## 2024-06-13 DIAGNOSIS — I129 Hypertensive chronic kidney disease with stage 1 through stage 4 chronic kidney disease, or unspecified chronic kidney disease: Secondary | ICD-10-CM | POA: Diagnosis not present

## 2024-06-13 DIAGNOSIS — I10 Essential (primary) hypertension: Secondary | ICD-10-CM | POA: Diagnosis not present

## 2024-06-13 DIAGNOSIS — I5030 Unspecified diastolic (congestive) heart failure: Secondary | ICD-10-CM | POA: Diagnosis not present

## 2024-06-13 DIAGNOSIS — E119 Type 2 diabetes mellitus without complications: Secondary | ICD-10-CM | POA: Diagnosis not present

## 2024-06-13 DIAGNOSIS — E785 Hyperlipidemia, unspecified: Secondary | ICD-10-CM | POA: Diagnosis not present

## 2024-06-13 DIAGNOSIS — G629 Polyneuropathy, unspecified: Secondary | ICD-10-CM | POA: Diagnosis not present

## 2024-06-13 DIAGNOSIS — Z79899 Other long term (current) drug therapy: Secondary | ICD-10-CM | POA: Diagnosis not present

## 2024-06-13 DIAGNOSIS — M1990 Unspecified osteoarthritis, unspecified site: Secondary | ICD-10-CM | POA: Diagnosis not present

## 2024-06-13 DIAGNOSIS — N182 Chronic kidney disease, stage 2 (mild): Secondary | ICD-10-CM | POA: Diagnosis not present

## 2024-06-13 DIAGNOSIS — I130 Hypertensive heart and chronic kidney disease with heart failure and stage 1 through stage 4 chronic kidney disease, or unspecified chronic kidney disease: Secondary | ICD-10-CM | POA: Diagnosis not present

## 2024-06-17 DIAGNOSIS — R269 Unspecified abnormalities of gait and mobility: Secondary | ICD-10-CM | POA: Diagnosis not present

## 2024-06-17 DIAGNOSIS — I5022 Chronic systolic (congestive) heart failure: Secondary | ICD-10-CM | POA: Diagnosis not present

## 2024-06-17 DIAGNOSIS — R54 Age-related physical debility: Secondary | ICD-10-CM | POA: Diagnosis not present

## 2024-06-19 DIAGNOSIS — E119 Type 2 diabetes mellitus without complications: Secondary | ICD-10-CM | POA: Diagnosis not present

## 2024-06-19 DIAGNOSIS — T148XXA Other injury of unspecified body region, initial encounter: Secondary | ICD-10-CM | POA: Diagnosis not present

## 2024-06-19 DIAGNOSIS — I739 Peripheral vascular disease, unspecified: Secondary | ICD-10-CM | POA: Diagnosis not present

## 2024-06-20 DIAGNOSIS — I739 Peripheral vascular disease, unspecified: Secondary | ICD-10-CM | POA: Diagnosis not present

## 2024-06-20 DIAGNOSIS — L84 Corns and callosities: Secondary | ICD-10-CM | POA: Diagnosis not present

## 2024-06-20 DIAGNOSIS — B351 Tinea unguium: Secondary | ICD-10-CM | POA: Diagnosis not present

## 2024-06-21 DIAGNOSIS — E785 Hyperlipidemia, unspecified: Secondary | ICD-10-CM | POA: Diagnosis not present

## 2024-06-21 DIAGNOSIS — E119 Type 2 diabetes mellitus without complications: Secondary | ICD-10-CM | POA: Diagnosis not present

## 2024-06-27 DIAGNOSIS — R54 Age-related physical debility: Secondary | ICD-10-CM | POA: Diagnosis not present

## 2024-06-27 DIAGNOSIS — R269 Unspecified abnormalities of gait and mobility: Secondary | ICD-10-CM | POA: Diagnosis not present

## 2024-06-27 DIAGNOSIS — I5022 Chronic systolic (congestive) heart failure: Secondary | ICD-10-CM | POA: Diagnosis not present

## 2024-07-02 DIAGNOSIS — R269 Unspecified abnormalities of gait and mobility: Secondary | ICD-10-CM | POA: Diagnosis not present

## 2024-07-02 DIAGNOSIS — R54 Age-related physical debility: Secondary | ICD-10-CM | POA: Diagnosis not present

## 2024-07-02 DIAGNOSIS — I5022 Chronic systolic (congestive) heart failure: Secondary | ICD-10-CM | POA: Diagnosis not present

## 2024-07-03 DIAGNOSIS — R269 Unspecified abnormalities of gait and mobility: Secondary | ICD-10-CM | POA: Diagnosis not present

## 2024-07-03 DIAGNOSIS — I5022 Chronic systolic (congestive) heart failure: Secondary | ICD-10-CM | POA: Diagnosis not present

## 2024-07-03 DIAGNOSIS — G629 Polyneuropathy, unspecified: Secondary | ICD-10-CM | POA: Diagnosis not present

## 2024-07-03 DIAGNOSIS — I739 Peripheral vascular disease, unspecified: Secondary | ICD-10-CM | POA: Diagnosis not present

## 2024-07-03 DIAGNOSIS — R54 Age-related physical debility: Secondary | ICD-10-CM | POA: Diagnosis not present

## 2024-07-03 DIAGNOSIS — M159 Polyosteoarthritis, unspecified: Secondary | ICD-10-CM | POA: Diagnosis not present

## 2024-07-17 DIAGNOSIS — I509 Heart failure, unspecified: Secondary | ICD-10-CM | POA: Diagnosis not present

## 2024-07-17 DIAGNOSIS — G629 Polyneuropathy, unspecified: Secondary | ICD-10-CM | POA: Diagnosis not present

## 2024-07-17 DIAGNOSIS — D519 Vitamin B12 deficiency anemia, unspecified: Secondary | ICD-10-CM | POA: Diagnosis not present

## 2024-07-19 DIAGNOSIS — E785 Hyperlipidemia, unspecified: Secondary | ICD-10-CM | POA: Diagnosis not present

## 2024-07-19 DIAGNOSIS — Z23 Encounter for immunization: Secondary | ICD-10-CM | POA: Diagnosis not present

## 2024-07-19 DIAGNOSIS — E119 Type 2 diabetes mellitus without complications: Secondary | ICD-10-CM | POA: Diagnosis not present

## 2024-07-31 DIAGNOSIS — M549 Dorsalgia, unspecified: Secondary | ICD-10-CM | POA: Diagnosis not present

## 2024-07-31 DIAGNOSIS — M1990 Unspecified osteoarthritis, unspecified site: Secondary | ICD-10-CM | POA: Diagnosis not present

## 2024-07-31 DIAGNOSIS — I739 Peripheral vascular disease, unspecified: Secondary | ICD-10-CM | POA: Diagnosis not present

## 2024-07-31 DIAGNOSIS — G629 Polyneuropathy, unspecified: Secondary | ICD-10-CM | POA: Diagnosis not present

## 2024-08-05 DIAGNOSIS — M79609 Pain in unspecified limb: Secondary | ICD-10-CM | POA: Diagnosis not present

## 2024-08-05 DIAGNOSIS — I872 Venous insufficiency (chronic) (peripheral): Secondary | ICD-10-CM | POA: Diagnosis not present

## 2024-08-05 DIAGNOSIS — R609 Edema, unspecified: Secondary | ICD-10-CM | POA: Diagnosis not present

## 2024-08-05 DIAGNOSIS — R54 Age-related physical debility: Secondary | ICD-10-CM | POA: Diagnosis not present

## 2024-08-05 DIAGNOSIS — L97911 Non-pressure chronic ulcer of unspecified part of right lower leg limited to breakdown of skin: Secondary | ICD-10-CM | POA: Diagnosis not present

## 2024-08-14 DIAGNOSIS — Z7901 Long term (current) use of anticoagulants: Secondary | ICD-10-CM | POA: Diagnosis not present

## 2024-08-14 DIAGNOSIS — L97819 Non-pressure chronic ulcer of other part of right lower leg with unspecified severity: Secondary | ICD-10-CM | POA: Diagnosis not present

## 2024-08-14 DIAGNOSIS — I110 Hypertensive heart disease with heart failure: Secondary | ICD-10-CM | POA: Diagnosis not present

## 2024-08-14 DIAGNOSIS — I739 Peripheral vascular disease, unspecified: Secondary | ICD-10-CM | POA: Diagnosis not present

## 2024-08-14 DIAGNOSIS — R600 Localized edema: Secondary | ICD-10-CM | POA: Diagnosis not present

## 2024-08-14 DIAGNOSIS — I5032 Chronic diastolic (congestive) heart failure: Secondary | ICD-10-CM | POA: Diagnosis not present

## 2024-08-22 DIAGNOSIS — L84 Corns and callosities: Secondary | ICD-10-CM | POA: Diagnosis not present

## 2024-08-22 DIAGNOSIS — B351 Tinea unguium: Secondary | ICD-10-CM | POA: Diagnosis not present

## 2024-08-22 DIAGNOSIS — I739 Peripheral vascular disease, unspecified: Secondary | ICD-10-CM | POA: Diagnosis not present

## 2024-08-23 DIAGNOSIS — E669 Obesity, unspecified: Secondary | ICD-10-CM | POA: Diagnosis not present

## 2024-08-23 DIAGNOSIS — I739 Peripheral vascular disease, unspecified: Secondary | ICD-10-CM | POA: Diagnosis not present

## 2024-08-28 DIAGNOSIS — G6289 Other specified polyneuropathies: Secondary | ICD-10-CM | POA: Diagnosis not present

## 2024-08-28 DIAGNOSIS — I739 Peripheral vascular disease, unspecified: Secondary | ICD-10-CM | POA: Diagnosis not present

## 2024-08-28 DIAGNOSIS — M1991 Primary osteoarthritis, unspecified site: Secondary | ICD-10-CM | POA: Diagnosis not present

## 2024-09-04 DIAGNOSIS — H04123 Dry eye syndrome of bilateral lacrimal glands: Secondary | ICD-10-CM | POA: Diagnosis not present

## 2024-09-04 DIAGNOSIS — I739 Peripheral vascular disease, unspecified: Secondary | ICD-10-CM | POA: Diagnosis not present

## 2024-09-04 DIAGNOSIS — R4189 Other symptoms and signs involving cognitive functions and awareness: Secondary | ICD-10-CM | POA: Diagnosis not present

## 2024-09-11 DIAGNOSIS — F424 Excoriation (skin-picking) disorder: Secondary | ICD-10-CM | POA: Diagnosis not present

## 2024-09-11 DIAGNOSIS — G3184 Mild cognitive impairment, so stated: Secondary | ICD-10-CM | POA: Diagnosis not present

## 2024-09-11 DIAGNOSIS — S51802A Unspecified open wound of left forearm, initial encounter: Secondary | ICD-10-CM | POA: Diagnosis not present

## 2024-09-11 DIAGNOSIS — F324 Major depressive disorder, single episode, in partial remission: Secondary | ICD-10-CM | POA: Diagnosis not present

## 2024-09-25 DIAGNOSIS — I739 Peripheral vascular disease, unspecified: Secondary | ICD-10-CM | POA: Diagnosis not present

## 2024-09-25 DIAGNOSIS — G6289 Other specified polyneuropathies: Secondary | ICD-10-CM | POA: Diagnosis not present

## 2024-09-25 DIAGNOSIS — E11620 Type 2 diabetes mellitus with diabetic dermatitis: Secondary | ICD-10-CM | POA: Diagnosis not present

## 2024-09-25 DIAGNOSIS — F0390 Unspecified dementia without behavioral disturbance: Secondary | ICD-10-CM | POA: Diagnosis not present

## 2024-09-25 DIAGNOSIS — M1991 Primary osteoarthritis, unspecified site: Secondary | ICD-10-CM | POA: Diagnosis not present

## 2024-09-25 DIAGNOSIS — E782 Mixed hyperlipidemia: Secondary | ICD-10-CM | POA: Diagnosis not present
